# Patient Record
Sex: Male | Born: 1965 | Race: White | Hispanic: No | Marital: Single | Smoking: Former smoker
Health system: Southern US, Community
[De-identification: ages and names within clinical notes are randomized; demographics above are authoritative.]

## PROBLEM LIST (undated history)

## (undated) DIAGNOSIS — N289 Disorder of kidney and ureter, unspecified: Secondary | ICD-10-CM

## (undated) DIAGNOSIS — R55 Syncope and collapse: Secondary | ICD-10-CM

## (undated) DIAGNOSIS — J449 Chronic obstructive pulmonary disease, unspecified: Secondary | ICD-10-CM

## (undated) DIAGNOSIS — G4730 Sleep apnea, unspecified: Secondary | ICD-10-CM

## (undated) DIAGNOSIS — I10 Essential (primary) hypertension: Secondary | ICD-10-CM

## (undated) DIAGNOSIS — K7460 Unspecified cirrhosis of liver: Secondary | ICD-10-CM

## (undated) DIAGNOSIS — Z720 Tobacco use: Secondary | ICD-10-CM

## (undated) DIAGNOSIS — E119 Type 2 diabetes mellitus without complications: Secondary | ICD-10-CM

## (undated) DIAGNOSIS — T1491XA Suicide attempt, initial encounter: Secondary | ICD-10-CM

## (undated) HISTORY — PX: LEG SURGERY: SHX1003

## (undated) HISTORY — PX: CHOLECYSTECTOMY: SHX55

---

## 2004-07-26 ENCOUNTER — Inpatient Hospital Stay: Payer: Self-pay | Admitting: Anesthesiology

## 2005-11-21 ENCOUNTER — Other Ambulatory Visit: Payer: Self-pay

## 2005-11-21 ENCOUNTER — Inpatient Hospital Stay: Payer: Self-pay

## 2005-12-04 ENCOUNTER — Ambulatory Visit: Payer: Self-pay | Admitting: Internal Medicine

## 2005-12-13 ENCOUNTER — Ambulatory Visit: Payer: Self-pay | Admitting: Internal Medicine

## 2005-12-19 ENCOUNTER — Ambulatory Visit: Payer: Self-pay | Admitting: Gastroenterology

## 2006-01-13 ENCOUNTER — Ambulatory Visit: Payer: Self-pay | Admitting: Internal Medicine

## 2006-02-06 ENCOUNTER — Emergency Department: Payer: Self-pay | Admitting: Emergency Medicine

## 2006-02-06 ENCOUNTER — Other Ambulatory Visit: Payer: Self-pay

## 2006-02-12 ENCOUNTER — Ambulatory Visit: Payer: Self-pay | Admitting: Internal Medicine

## 2006-04-23 ENCOUNTER — Ambulatory Visit: Payer: Self-pay | Admitting: Internal Medicine

## 2006-05-15 ENCOUNTER — Ambulatory Visit: Payer: Self-pay | Admitting: Internal Medicine

## 2006-09-13 ENCOUNTER — Ambulatory Visit: Payer: Self-pay | Admitting: Internal Medicine

## 2006-10-02 ENCOUNTER — Ambulatory Visit: Payer: Self-pay | Admitting: Internal Medicine

## 2006-10-14 ENCOUNTER — Ambulatory Visit: Payer: Self-pay | Admitting: Internal Medicine

## 2006-11-13 ENCOUNTER — Ambulatory Visit: Payer: Self-pay | Admitting: Internal Medicine

## 2006-11-27 ENCOUNTER — Ambulatory Visit: Payer: Self-pay | Admitting: Internal Medicine

## 2006-12-14 ENCOUNTER — Ambulatory Visit: Payer: Self-pay | Admitting: Internal Medicine

## 2007-01-08 ENCOUNTER — Encounter: Payer: Self-pay | Admitting: Internal Medicine

## 2007-01-14 ENCOUNTER — Ambulatory Visit: Payer: Self-pay | Admitting: Internal Medicine

## 2007-01-25 ENCOUNTER — Ambulatory Visit: Payer: Self-pay | Admitting: Internal Medicine

## 2007-02-13 ENCOUNTER — Ambulatory Visit: Payer: Self-pay | Admitting: Internal Medicine

## 2007-10-14 ENCOUNTER — Ambulatory Visit: Payer: Self-pay | Admitting: Internal Medicine

## 2010-05-24 ENCOUNTER — Inpatient Hospital Stay: Payer: Self-pay | Admitting: Internal Medicine

## 2010-06-10 ENCOUNTER — Inpatient Hospital Stay: Payer: Self-pay | Admitting: Internal Medicine

## 2010-06-16 ENCOUNTER — Inpatient Hospital Stay: Payer: Self-pay | Admitting: Internal Medicine

## 2010-06-17 ENCOUNTER — Inpatient Hospital Stay: Payer: Self-pay | Admitting: Psychiatry

## 2010-06-27 ENCOUNTER — Inpatient Hospital Stay: Payer: Self-pay | Admitting: Psychiatry

## 2010-07-27 ENCOUNTER — Inpatient Hospital Stay: Payer: Self-pay | Admitting: Psychiatry

## 2010-08-06 ENCOUNTER — Emergency Department: Payer: Self-pay | Admitting: Unknown Physician Specialty

## 2010-08-08 ENCOUNTER — Ambulatory Visit: Payer: Self-pay | Admitting: Internal Medicine

## 2010-08-22 ENCOUNTER — Emergency Department: Payer: Self-pay | Admitting: Emergency Medicine

## 2010-08-31 ENCOUNTER — Inpatient Hospital Stay: Payer: Self-pay | Admitting: Internal Medicine

## 2010-09-05 LAB — AFP TUMOR MARKER: AFP-Tumor Marker: 3.3 ng/mL (ref 0.0–8.3)

## 2010-09-14 ENCOUNTER — Inpatient Hospital Stay: Payer: Self-pay | Admitting: Psychiatry

## 2010-09-28 ENCOUNTER — Inpatient Hospital Stay: Payer: Self-pay | Admitting: Psychiatry

## 2010-12-10 ENCOUNTER — Inpatient Hospital Stay: Payer: Self-pay | Admitting: Internal Medicine

## 2011-02-08 ENCOUNTER — Inpatient Hospital Stay: Payer: Self-pay | Admitting: Internal Medicine

## 2011-03-22 ENCOUNTER — Ambulatory Visit: Payer: Self-pay | Admitting: Gastroenterology

## 2011-04-11 ENCOUNTER — Ambulatory Visit: Payer: Self-pay | Admitting: Gastroenterology

## 2011-05-18 ENCOUNTER — Inpatient Hospital Stay: Payer: Self-pay | Admitting: Internal Medicine

## 2011-05-18 LAB — URINALYSIS, COMPLETE
Bacteria: NONE SEEN
Bilirubin,UR: NEGATIVE
Blood: NEGATIVE
Glucose,UR: NEGATIVE mg/dL (ref 0–75)
Ketone: NEGATIVE
Leukocyte Esterase: NEGATIVE
Nitrite: NEGATIVE
Ph: 5 (ref 4.5–8.0)
Protein: NEGATIVE

## 2011-05-18 LAB — COMPREHENSIVE METABOLIC PANEL
Alkaline Phosphatase: 110 U/L (ref 50–136)
Anion Gap: 8 (ref 7–16)
BUN: 23 mg/dL — ABNORMAL HIGH (ref 7–18)
Bilirubin,Total: 0.9 mg/dL (ref 0.2–1.0)
Co2: 36 mmol/L — ABNORMAL HIGH (ref 21–32)
EGFR (Non-African Amer.): >60
SGOT(AST): 27 U/L (ref 15–37)
SGPT (ALT): 25 U/L
Sodium: 142 mmol/L (ref 136–145)
Total Protein: 8 g/dL (ref 6.4–8.2)

## 2011-05-18 LAB — CBC
HCT: 42.7 % (ref 40.0–52.0)
MCH: 32.3 pg (ref 26.0–34.0)
MCHC: 33.3 g/dL (ref 32.0–36.0)
MCV: 97 fL (ref 80–100)
Platelet: 154 10*3/uL (ref 150–440)
RDW: 14.7 % — ABNORMAL HIGH (ref 11.5–14.5)
WBC: 6.5 10*3/uL (ref 3.8–10.6)

## 2011-05-18 LAB — LIPASE, BLOOD: Lipase: 85 U/L (ref 73–393)

## 2011-05-18 LAB — PROTIME-INR: Prothrombin Time: 13.6 secs (ref 11.5–14.7)

## 2011-05-19 LAB — CBC WITH DIFFERENTIAL/PLATELET
Basophil #: 0 10*3/uL (ref 0.0–0.1)
Eosinophil #: 0 10*3/uL (ref 0.0–0.7)
HCT: 38.7 % — ABNORMAL LOW (ref 40.0–52.0)
Lymphocyte %: 10.9 %
MCH: 32.2 pg (ref 26.0–34.0)
MCHC: 33.3 g/dL (ref 32.0–36.0)
Monocyte %: 2.2 %
Neutrophil #: 3.2 10*3/uL (ref 1.4–6.5)
Neutrophil %: 86.6 %
Platelet: 108 10*3/uL — ABNORMAL LOW (ref 150–440)
RBC: 4 10*6/uL — ABNORMAL LOW (ref 4.40–5.90)
RDW: 14.4 % (ref 11.5–14.5)

## 2011-05-19 LAB — BASIC METABOLIC PANEL
Anion Gap: 10 (ref 7–16)
Chloride: 98 mmol/L (ref 98–107)
Co2: 31 mmol/L (ref 21–32)
Creatinine: 1.38 mg/dL — ABNORMAL HIGH (ref 0.60–1.30)
Glucose: 144 mg/dL — ABNORMAL HIGH (ref 65–99)
Osmolality: 284 (ref 275–301)

## 2011-05-19 LAB — BODY FLUID CELL COUNT WITH DIFFERENTIAL
Basophil: 0 %
Eosinophil: 0 %
Nucleated Cell Count: 156 /mm3
Other Mononuclear Cells: 68 %

## 2011-05-20 LAB — CBC WITH DIFFERENTIAL/PLATELET
Basophil %: 0 %
Eosinophil #: 0 10*3/uL (ref 0.0–0.7)
HCT: 39.8 % — ABNORMAL LOW (ref 40.0–52.0)
HGB: 13.3 g/dL (ref 13.0–18.0)
Lymphocyte #: 0.4 10*3/uL — ABNORMAL LOW (ref 1.0–3.6)
Lymphocyte %: 6.4 %
MCH: 32 pg (ref 26.0–34.0)
MCHC: 33.3 g/dL (ref 32.0–36.0)
MCV: 96 fL (ref 80–100)
Monocyte %: 1.9 %
Neutrophil #: 5.9 10*3/uL (ref 1.4–6.5)
Platelet: 121 10*3/uL — ABNORMAL LOW (ref 150–440)
RDW: 14.6 % — ABNORMAL HIGH (ref 11.5–14.5)

## 2011-05-20 LAB — BASIC METABOLIC PANEL
Anion Gap: 9 (ref 7–16)
BUN: 19 mg/dL — ABNORMAL HIGH (ref 7–18)
Calcium, Total: 8.6 mg/dL (ref 8.5–10.1)
Chloride: 97 mmol/L — ABNORMAL LOW (ref 98–107)
Creatinine: 1.24 mg/dL (ref 0.60–1.30)
EGFR (African American): >60
EGFR (Non-African Amer.): >60
Glucose: 209 mg/dL — ABNORMAL HIGH (ref 65–99)
Osmolality: 286 (ref 275–301)
Potassium: 4.1 mmol/L (ref 3.5–5.1)
Sodium: 139 mmol/L (ref 136–145)

## 2011-05-21 LAB — CBC WITH DIFFERENTIAL/PLATELET
Basophil %: 0.2 %
Eosinophil #: 0 10*3/uL (ref 0.0–0.7)
Eosinophil %: 0 %
HCT: 40.2 % (ref 40.0–52.0)
HGB: 13.4 g/dL (ref 13.0–18.0)
MCH: 32.3 pg (ref 26.0–34.0)
MCHC: 33.3 g/dL (ref 32.0–36.0)
MCV: 97 fL (ref 80–100)
Monocyte %: 2.5 %
Neutrophil #: 5.1 10*3/uL (ref 1.4–6.5)
Neutrophil %: 90.2 %
Platelet: 108 10*3/uL — ABNORMAL LOW (ref 150–440)
RBC: 4.14 10*6/uL — ABNORMAL LOW (ref 4.40–5.90)
RDW: 14.9 % — ABNORMAL HIGH (ref 11.5–14.5)
WBC: 5.7 10*3/uL (ref 3.8–10.6)

## 2011-05-21 LAB — BASIC METABOLIC PANEL
Anion Gap: 6 — ABNORMAL LOW (ref 7–16)
BUN: 19 mg/dL — ABNORMAL HIGH (ref 7–18)
Chloride: 101 mmol/L (ref 98–107)
Co2: 33 mmol/L — ABNORMAL HIGH (ref 21–32)
Creatinine: 1.08 mg/dL (ref 0.60–1.30)
EGFR (Non-African Amer.): >60
Glucose: 138 mg/dL — ABNORMAL HIGH (ref 65–99)
Sodium: 140 mmol/L (ref 136–145)

## 2011-05-22 LAB — BASIC METABOLIC PANEL
Anion Gap: 9 (ref 7–16)
BUN: 24 mg/dL — ABNORMAL HIGH (ref 7–18)
Calcium, Total: 8.5 mg/dL (ref 8.5–10.1)
Chloride: 102 mmol/L (ref 98–107)
Co2: 31 mmol/L (ref 21–32)
EGFR (African American): >60
EGFR (Non-African Amer.): >60
Glucose: 155 mg/dL — ABNORMAL HIGH (ref 65–99)
Osmolality: 290 (ref 275–301)
Potassium: 4.1 mmol/L (ref 3.5–5.1)

## 2011-05-22 LAB — CBC WITH DIFFERENTIAL/PLATELET
Basophil #: 0 10*3/uL (ref 0.0–0.1)
Eosinophil #: 0 10*3/uL (ref 0.0–0.7)
HCT: 40.5 % (ref 40.0–52.0)
HGB: 13.5 g/dL (ref 13.0–18.0)
Lymphocyte #: 0.3 10*3/uL — ABNORMAL LOW (ref 1.0–3.6)
Lymphocyte %: 6.3 %
MCH: 32.1 pg (ref 26.0–34.0)
MCHC: 33.5 g/dL (ref 32.0–36.0)
Monocyte #: 0.2 10*3/uL (ref 0.0–0.7)
Neutrophil #: 5 10*3/uL (ref 1.4–6.5)
Neutrophil %: 90.6 %
RBC: 4.22 10*6/uL — ABNORMAL LOW (ref 4.40–5.90)
WBC: 5.5 10*3/uL (ref 3.8–10.6)

## 2011-05-23 LAB — CBC WITH DIFFERENTIAL/PLATELET
Basophil #: 0 10*3/uL (ref 0.0–0.1)
Basophil %: 0.1 %
HCT: 39.1 % — ABNORMAL LOW (ref 40.0–52.0)
HGB: 13 g/dL (ref 13.0–18.0)
Lymphocyte #: 0.3 10*3/uL — ABNORMAL LOW (ref 1.0–3.6)
MCH: 32.1 pg (ref 26.0–34.0)
MCV: 96 fL (ref 80–100)
Monocyte #: 0.2 10*3/uL (ref 0.0–0.7)
Monocyte %: 3.6 %
Neutrophil #: 3.8 10*3/uL (ref 1.4–6.5)
Neutrophil %: 89.5 %
Platelet: 74 10*3/uL — ABNORMAL LOW (ref 150–440)
RDW: 15.1 % — ABNORMAL HIGH (ref 11.5–14.5)

## 2011-05-23 LAB — BASIC METABOLIC PANEL
Calcium, Total: 8.1 mg/dL — ABNORMAL LOW (ref 8.5–10.1)
Co2: 33 mmol/L — ABNORMAL HIGH (ref 21–32)
EGFR (Non-African Amer.): >60
Glucose: 107 mg/dL — ABNORMAL HIGH (ref 65–99)
Osmolality: 292 (ref 275–301)
Potassium: 4.5 mmol/L (ref 3.5–5.1)
Sodium: 142 mmol/L (ref 136–145)

## 2011-05-23 LAB — BODY FLUID CULTURE

## 2011-05-23 LAB — AMMONIA: Ammonia, Plasma: 28 mcmol/L (ref 11–32)

## 2011-05-24 LAB — CULTURE, BLOOD (SINGLE)

## 2011-06-27 ENCOUNTER — Ambulatory Visit: Payer: Self-pay | Admitting: Gastroenterology

## 2011-06-27 LAB — AMMONIA: Ammonia, Plasma: 61 mcmol/L — ABNORMAL HIGH (ref 11–32)

## 2011-07-03 ENCOUNTER — Ambulatory Visit: Payer: Self-pay | Admitting: Gastroenterology

## 2011-10-26 ENCOUNTER — Ambulatory Visit: Payer: Self-pay | Admitting: Gastroenterology

## 2011-11-21 ENCOUNTER — Ambulatory Visit: Payer: Self-pay | Admitting: Gastroenterology

## 2011-12-22 ENCOUNTER — Observation Stay: Payer: Self-pay | Admitting: Internal Medicine

## 2011-12-22 LAB — CBC WITH DIFFERENTIAL/PLATELET
Basophil #: 0.1 10*3/uL (ref 0.0–0.1)
Basophil %: 1.1 %
Eosinophil #: 0.1 10*3/uL (ref 0.0–0.7)
HCT: 43.3 % (ref 40.0–52.0)
HGB: 14.6 g/dL (ref 13.0–18.0)
Lymphocyte #: 1.7 10*3/uL (ref 1.0–3.6)
Lymphocyte %: 21.2 %
MCH: 33.2 pg (ref 26.0–34.0)
MCHC: 33.7 g/dL (ref 32.0–36.0)
MCV: 99 fL (ref 80–100)
Monocyte %: 7.1 %
Neutrophil #: 5.5 10*3/uL (ref 1.4–6.5)
Platelet: 165 10*3/uL (ref 150–440)
RDW: 14.5 % (ref 11.5–14.5)

## 2011-12-22 LAB — COMPREHENSIVE METABOLIC PANEL
Albumin: 3.6 g/dL (ref 3.4–5.0)
Anion Gap: 7 (ref 7–16)
BUN: 29 mg/dL — ABNORMAL HIGH (ref 7–18)
Calcium, Total: 8.9 mg/dL (ref 8.5–10.1)
Chloride: 102 mmol/L (ref 98–107)
EGFR (Non-African Amer.): 51 — ABNORMAL LOW
Glucose: 96 mg/dL (ref 65–99)
Osmolality: 280 (ref 275–301)
Potassium: 4.2 mmol/L (ref 3.5–5.1)
SGOT(AST): 23 U/L (ref 15–37)
SGPT (ALT): 18 U/L (ref 12–78)
Total Protein: 8.2 g/dL (ref 6.4–8.2)

## 2011-12-22 LAB — CK TOTAL AND CKMB (NOT AT ARMC): CK-MB: <0.5 ng/mL — ABNORMAL LOW (ref 0.5–3.6)

## 2011-12-22 LAB — TROPONIN I: Troponin-I: <0.02 ng/mL

## 2011-12-22 LAB — APTT: Activated PTT: 29.9 secs (ref 23.6–35.9)

## 2011-12-23 LAB — BASIC METABOLIC PANEL
Anion Gap: 8 (ref 7–16)
Calcium, Total: 8.6 mg/dL (ref 8.5–10.1)
Chloride: 102 mmol/L (ref 98–107)
Co2: 30 mmol/L (ref 21–32)
Creatinine: 1.37 mg/dL — ABNORMAL HIGH (ref 0.60–1.30)
EGFR (African American): >60
Glucose: 97 mg/dL (ref 65–99)
Osmolality: 285 (ref 275–301)
Sodium: 140 mmol/L (ref 136–145)

## 2011-12-23 LAB — TROPONIN I: Troponin-I: <0.02 ng/mL

## 2011-12-23 LAB — LIPID PANEL
Ldl Cholesterol, Calc: 62 mg/dL (ref 0–100)
Triglycerides: 85 mg/dL (ref 0–200)
VLDL Cholesterol, Calc: 17 mg/dL (ref 5–40)

## 2012-01-05 ENCOUNTER — Emergency Department: Payer: Self-pay | Admitting: Emergency Medicine

## 2012-01-05 LAB — URINALYSIS, COMPLETE
Bilirubin,UR: NEGATIVE
Blood: NEGATIVE
Ketone: NEGATIVE
Nitrite: NEGATIVE
Ph: 5 (ref 4.5–8.0)
Protein: NEGATIVE
RBC,UR: NONE SEEN /HPF (ref 0–5)
Specific Gravity: 1.01 (ref 1.003–1.030)
Squamous Epithelial: 8

## 2012-01-05 LAB — COMPREHENSIVE METABOLIC PANEL
Albumin: 3.6 g/dL (ref 3.4–5.0)
Alkaline Phosphatase: 122 U/L (ref 50–136)
Anion Gap: 5 — ABNORMAL LOW (ref 7–16)
Bilirubin,Total: 0.8 mg/dL (ref 0.2–1.0)
Calcium, Total: 9.1 mg/dL (ref 8.5–10.1)
Chloride: 103 mmol/L (ref 98–107)
EGFR (African American): 50 — ABNORMAL LOW
EGFR (Non-African Amer.): 43 — ABNORMAL LOW
Glucose: 115 mg/dL — ABNORMAL HIGH (ref 65–99)
Osmolality: 289 (ref 275–301)
Potassium: 4.5 mmol/L (ref 3.5–5.1)
SGOT(AST): 23 U/L (ref 15–37)
Sodium: 139 mmol/L (ref 136–145)
Total Protein: 7.9 g/dL (ref 6.4–8.2)

## 2012-01-05 LAB — CBC
HCT: 40 % (ref 40.0–52.0)
HGB: 13.6 g/dL (ref 13.0–18.0)
MCH: 34 pg (ref 26.0–34.0)
MCHC: 34 g/dL (ref 32.0–36.0)
Platelet: 113 10*3/uL — ABNORMAL LOW (ref 150–440)
RBC: 4.01 10*6/uL — ABNORMAL LOW (ref 4.40–5.90)
RDW: 14.6 % — ABNORMAL HIGH (ref 11.5–14.5)
WBC: 8.4 10*3/uL (ref 3.8–10.6)

## 2012-02-27 ENCOUNTER — Ambulatory Visit: Payer: Self-pay | Admitting: Internal Medicine

## 2012-03-01 ENCOUNTER — Emergency Department: Payer: Self-pay | Admitting: Emergency Medicine

## 2012-03-08 ENCOUNTER — Ambulatory Visit: Payer: Self-pay | Admitting: Internal Medicine

## 2012-03-24 ENCOUNTER — Emergency Department: Payer: Self-pay | Admitting: Emergency Medicine

## 2012-04-02 ENCOUNTER — Ambulatory Visit: Payer: Self-pay | Admitting: Sports Medicine

## 2012-09-12 ENCOUNTER — Emergency Department: Payer: Self-pay | Admitting: Emergency Medicine

## 2012-09-12 LAB — BASIC METABOLIC PANEL
Anion Gap: 4 — ABNORMAL LOW (ref 7–16)
BUN: 31 mg/dL — ABNORMAL HIGH (ref 7–18)
Calcium, Total: 8.8 mg/dL (ref 8.5–10.1)
EGFR (Non-African Amer.): 53 — ABNORMAL LOW
Glucose: 100 mg/dL — ABNORMAL HIGH (ref 65–99)

## 2012-09-12 LAB — CBC
HGB: 13.8 g/dL (ref 13.0–18.0)
MCH: 34.6 pg — ABNORMAL HIGH (ref 26.0–34.0)
MCV: 97 fL (ref 80–100)
RBC: 3.98 10*6/uL — ABNORMAL LOW (ref 4.40–5.90)
RDW: 13.3 % (ref 11.5–14.5)

## 2012-10-22 ENCOUNTER — Other Ambulatory Visit (HOSPITAL_COMMUNITY): Payer: Self-pay | Admitting: Internal Medicine

## 2012-10-22 ENCOUNTER — Ambulatory Visit (HOSPITAL_COMMUNITY)
Admission: RE | Admit: 2012-10-22 | Discharge: 2012-10-22 | Disposition: A | Discharge status: Home / Self Care | Payer: Medicare Other | Source: Ambulatory Visit | Attending: Internal Medicine | Admitting: Internal Medicine

## 2012-10-22 DIAGNOSIS — I10 Essential (primary) hypertension: Secondary | ICD-10-CM | POA: Insufficient documentation

## 2012-10-22 DIAGNOSIS — J449 Chronic obstructive pulmonary disease, unspecified: Secondary | ICD-10-CM | POA: Diagnosis not present

## 2012-10-22 DIAGNOSIS — J40 Bronchitis, not specified as acute or chronic: Secondary | ICD-10-CM | POA: Diagnosis not present

## 2012-10-22 DIAGNOSIS — J4489 Other specified chronic obstructive pulmonary disease: Secondary | ICD-10-CM | POA: Insufficient documentation

## 2012-10-22 DIAGNOSIS — R059 Cough, unspecified: Secondary | ICD-10-CM | POA: Diagnosis not present

## 2012-10-22 DIAGNOSIS — J438 Other emphysema: Secondary | ICD-10-CM | POA: Diagnosis not present

## 2012-10-22 DIAGNOSIS — R05 Cough: Secondary | ICD-10-CM | POA: Insufficient documentation

## 2012-11-09 ENCOUNTER — Emergency Department: Payer: Self-pay | Admitting: Internal Medicine

## 2012-11-09 DIAGNOSIS — IMO0002 Reserved for concepts with insufficient information to code with codable children: Secondary | ICD-10-CM | POA: Diagnosis not present

## 2012-11-09 DIAGNOSIS — M1710 Unilateral primary osteoarthritis, unspecified knee: Secondary | ICD-10-CM | POA: Diagnosis not present

## 2012-11-09 DIAGNOSIS — G8929 Other chronic pain: Secondary | ICD-10-CM | POA: Diagnosis not present

## 2012-11-09 DIAGNOSIS — M25569 Pain in unspecified knee: Secondary | ICD-10-CM | POA: Diagnosis not present

## 2012-11-19 ENCOUNTER — Ambulatory Visit: Payer: Self-pay | Admitting: Gastroenterology

## 2012-11-19 DIAGNOSIS — K7030 Alcoholic cirrhosis of liver without ascites: Secondary | ICD-10-CM | POA: Diagnosis not present

## 2012-12-03 DIAGNOSIS — I10 Essential (primary) hypertension: Secondary | ICD-10-CM | POA: Diagnosis not present

## 2012-12-03 DIAGNOSIS — K7460 Unspecified cirrhosis of liver: Secondary | ICD-10-CM | POA: Diagnosis not present

## 2012-12-03 DIAGNOSIS — J449 Chronic obstructive pulmonary disease, unspecified: Secondary | ICD-10-CM | POA: Diagnosis not present

## 2012-12-11 ENCOUNTER — Emergency Department: Payer: Self-pay | Admitting: Emergency Medicine

## 2012-12-11 DIAGNOSIS — F333 Major depressive disorder, recurrent, severe with psychotic symptoms: Secondary | ICD-10-CM | POA: Diagnosis not present

## 2012-12-11 DIAGNOSIS — F17200 Nicotine dependence, unspecified, uncomplicated: Secondary | ICD-10-CM | POA: Diagnosis not present

## 2012-12-11 DIAGNOSIS — F329 Major depressive disorder, single episode, unspecified: Secondary | ICD-10-CM | POA: Diagnosis not present

## 2012-12-11 DIAGNOSIS — E119 Type 2 diabetes mellitus without complications: Secondary | ICD-10-CM | POA: Diagnosis not present

## 2012-12-11 DIAGNOSIS — R45851 Suicidal ideations: Secondary | ICD-10-CM | POA: Diagnosis not present

## 2012-12-11 DIAGNOSIS — I10 Essential (primary) hypertension: Secondary | ICD-10-CM | POA: Diagnosis not present

## 2012-12-11 DIAGNOSIS — F332 Major depressive disorder, recurrent severe without psychotic features: Secondary | ICD-10-CM | POA: Diagnosis not present

## 2012-12-11 DIAGNOSIS — Z79899 Other long term (current) drug therapy: Secondary | ICD-10-CM | POA: Diagnosis not present

## 2012-12-11 LAB — DRUG SCREEN, URINE
Amphetamines, Ur Screen: NEGATIVE (ref ?–1000)
Barbiturates, Ur Screen: NEGATIVE (ref ?–200)
Benzodiazepine, Ur Scrn: NEGATIVE (ref ?–200)
Cocaine Metabolite,Ur ~~LOC~~: NEGATIVE (ref ?–300)
MDMA (Ecstasy)Ur Screen: POSITIVE (ref ?–500)
Opiate, Ur Screen: NEGATIVE (ref ?–300)
Phencyclidine (PCP) Ur S: NEGATIVE (ref ?–25)
Tricyclic, Ur Screen: NEGATIVE (ref ?–1000)

## 2012-12-11 LAB — CBC
HCT: 39.6 % — ABNORMAL LOW (ref 40.0–52.0)
HGB: 14 g/dL (ref 13.0–18.0)
MCH: 34.6 pg — ABNORMAL HIGH (ref 26.0–34.0)
MCHC: 35.3 g/dL (ref 32.0–36.0)
Platelet: 119 10*3/uL — ABNORMAL LOW (ref 150–440)

## 2012-12-11 LAB — URINALYSIS, COMPLETE
Bilirubin,UR: NEGATIVE
Blood: NEGATIVE
Glucose,UR: NEGATIVE mg/dL (ref 0–75)
Nitrite: NEGATIVE
Ph: 5 (ref 4.5–8.0)
Protein: NEGATIVE
RBC,UR: 2 /HPF (ref 0–5)
Specific Gravity: 1.023 (ref 1.003–1.030)
Squamous Epithelial: 6
WBC UR: 4 /HPF (ref 0–5)

## 2012-12-11 LAB — COMPREHENSIVE METABOLIC PANEL
Albumin: 3.8 g/dL (ref 3.4–5.0)
Alkaline Phosphatase: 109 U/L (ref 50–136)
BUN: 30 mg/dL — ABNORMAL HIGH (ref 7–18)
Calcium, Total: 9 mg/dL (ref 8.5–10.1)
Co2: 28 mmol/L (ref 21–32)
EGFR (African American): 56 — ABNORMAL LOW
EGFR (Non-African Amer.): 49 — ABNORMAL LOW
Osmolality: 282 (ref 275–301)
Potassium: 4.4 mmol/L (ref 3.5–5.1)
SGPT (ALT): 27 U/L (ref 12–78)
Sodium: 138 mmol/L (ref 136–145)
Total Protein: 7.6 g/dL (ref 6.4–8.2)

## 2012-12-11 LAB — TSH: Thyroid Stimulating Horm: 1.69 u[IU]/mL

## 2012-12-12 DIAGNOSIS — R45851 Suicidal ideations: Secondary | ICD-10-CM | POA: Diagnosis not present

## 2012-12-12 DIAGNOSIS — F17200 Nicotine dependence, unspecified, uncomplicated: Secondary | ICD-10-CM | POA: Diagnosis present

## 2012-12-12 DIAGNOSIS — E119 Type 2 diabetes mellitus without complications: Secondary | ICD-10-CM | POA: Diagnosis not present

## 2012-12-12 DIAGNOSIS — J449 Chronic obstructive pulmonary disease, unspecified: Secondary | ICD-10-CM | POA: Diagnosis not present

## 2012-12-12 DIAGNOSIS — I10 Essential (primary) hypertension: Secondary | ICD-10-CM | POA: Diagnosis not present

## 2012-12-12 DIAGNOSIS — J4489 Other specified chronic obstructive pulmonary disease: Secondary | ICD-10-CM | POA: Diagnosis not present

## 2012-12-12 DIAGNOSIS — F332 Major depressive disorder, recurrent severe without psychotic features: Secondary | ICD-10-CM | POA: Diagnosis not present

## 2012-12-12 DIAGNOSIS — Z79899 Other long term (current) drug therapy: Secondary | ICD-10-CM | POA: Diagnosis not present

## 2012-12-12 DIAGNOSIS — K7460 Unspecified cirrhosis of liver: Secondary | ICD-10-CM | POA: Diagnosis present

## 2012-12-12 DIAGNOSIS — F329 Major depressive disorder, single episode, unspecified: Secondary | ICD-10-CM | POA: Diagnosis not present

## 2013-01-02 DIAGNOSIS — E119 Type 2 diabetes mellitus without complications: Secondary | ICD-10-CM | POA: Diagnosis not present

## 2013-01-02 DIAGNOSIS — Z79899 Other long term (current) drug therapy: Secondary | ICD-10-CM | POA: Diagnosis not present

## 2013-01-02 DIAGNOSIS — I10 Essential (primary) hypertension: Secondary | ICD-10-CM | POA: Diagnosis not present

## 2013-01-17 DIAGNOSIS — F331 Major depressive disorder, recurrent, moderate: Secondary | ICD-10-CM | POA: Diagnosis not present

## 2013-01-23 DIAGNOSIS — K7030 Alcoholic cirrhosis of liver without ascites: Secondary | ICD-10-CM | POA: Diagnosis not present

## 2013-01-23 DIAGNOSIS — K219 Gastro-esophageal reflux disease without esophagitis: Secondary | ICD-10-CM | POA: Diagnosis not present

## 2013-01-28 DIAGNOSIS — K7689 Other specified diseases of liver: Secondary | ICD-10-CM | POA: Diagnosis not present

## 2013-01-28 DIAGNOSIS — I10 Essential (primary) hypertension: Secondary | ICD-10-CM | POA: Diagnosis not present

## 2013-01-28 DIAGNOSIS — J449 Chronic obstructive pulmonary disease, unspecified: Secondary | ICD-10-CM | POA: Diagnosis not present

## 2013-01-28 DIAGNOSIS — E875 Hyperkalemia: Secondary | ICD-10-CM | POA: Diagnosis not present

## 2013-01-30 DIAGNOSIS — I10 Essential (primary) hypertension: Secondary | ICD-10-CM | POA: Diagnosis not present

## 2013-01-30 DIAGNOSIS — J449 Chronic obstructive pulmonary disease, unspecified: Secondary | ICD-10-CM | POA: Diagnosis not present

## 2013-01-30 DIAGNOSIS — K7689 Other specified diseases of liver: Secondary | ICD-10-CM | POA: Diagnosis not present

## 2013-01-30 DIAGNOSIS — E875 Hyperkalemia: Secondary | ICD-10-CM | POA: Diagnosis not present

## 2013-02-04 DIAGNOSIS — E119 Type 2 diabetes mellitus without complications: Secondary | ICD-10-CM | POA: Diagnosis not present

## 2013-02-04 DIAGNOSIS — J449 Chronic obstructive pulmonary disease, unspecified: Secondary | ICD-10-CM | POA: Diagnosis not present

## 2013-02-04 DIAGNOSIS — Z23 Encounter for immunization: Secondary | ICD-10-CM | POA: Diagnosis not present

## 2013-02-11 DIAGNOSIS — IMO0002 Reserved for concepts with insufficient information to code with codable children: Secondary | ICD-10-CM | POA: Diagnosis not present

## 2013-02-11 DIAGNOSIS — I129 Hypertensive chronic kidney disease with stage 1 through stage 4 chronic kidney disease, or unspecified chronic kidney disease: Secondary | ICD-10-CM | POA: Diagnosis not present

## 2013-02-11 DIAGNOSIS — E119 Type 2 diabetes mellitus without complications: Secondary | ICD-10-CM | POA: Diagnosis not present

## 2013-03-17 DIAGNOSIS — F314 Bipolar disorder, current episode depressed, severe, without psychotic features: Secondary | ICD-10-CM | POA: Diagnosis not present

## 2013-03-25 ENCOUNTER — Inpatient Hospital Stay: Payer: Self-pay | Admitting: Internal Medicine

## 2013-03-25 DIAGNOSIS — N183 Chronic kidney disease, stage 3 unspecified: Secondary | ICD-10-CM | POA: Diagnosis not present

## 2013-03-25 DIAGNOSIS — I959 Hypotension, unspecified: Secondary | ICD-10-CM | POA: Diagnosis not present

## 2013-03-25 DIAGNOSIS — R1013 Epigastric pain: Secondary | ICD-10-CM | POA: Diagnosis not present

## 2013-03-25 DIAGNOSIS — R6510 Systemic inflammatory response syndrome (SIRS) of non-infectious origin without acute organ dysfunction: Secondary | ICD-10-CM | POA: Diagnosis not present

## 2013-03-25 DIAGNOSIS — N289 Disorder of kidney and ureter, unspecified: Secondary | ICD-10-CM | POA: Diagnosis not present

## 2013-03-25 DIAGNOSIS — R5381 Other malaise: Secondary | ICD-10-CM | POA: Diagnosis not present

## 2013-03-25 DIAGNOSIS — J984 Other disorders of lung: Secondary | ICD-10-CM | POA: Diagnosis not present

## 2013-03-25 DIAGNOSIS — R52 Pain, unspecified: Secondary | ICD-10-CM | POA: Diagnosis not present

## 2013-03-25 DIAGNOSIS — E119 Type 2 diabetes mellitus without complications: Secondary | ICD-10-CM | POA: Diagnosis not present

## 2013-03-25 DIAGNOSIS — F17200 Nicotine dependence, unspecified, uncomplicated: Secondary | ICD-10-CM | POA: Diagnosis not present

## 2013-03-25 DIAGNOSIS — K7460 Unspecified cirrhosis of liver: Secondary | ICD-10-CM | POA: Diagnosis not present

## 2013-03-25 DIAGNOSIS — J9600 Acute respiratory failure, unspecified whether with hypoxia or hypercapnia: Secondary | ICD-10-CM | POA: Diagnosis not present

## 2013-03-25 LAB — COMPREHENSIVE METABOLIC PANEL
Albumin: 3.4 g/dL (ref 3.4–5.0)
Alkaline Phosphatase: 89 U/L (ref 50–136)
Anion Gap: 4 — ABNORMAL LOW (ref 7–16)
BUN: 63 mg/dL — ABNORMAL HIGH (ref 7–18)
Calcium, Total: 8.4 mg/dL — ABNORMAL LOW (ref 8.5–10.1)
Chloride: 105 mmol/L (ref 98–107)
Co2: 28 mmol/L (ref 21–32)
Creatinine: 2.45 mg/dL — ABNORMAL HIGH (ref 0.60–1.30)
Glucose: 173 mg/dL — ABNORMAL HIGH (ref 65–99)
Potassium: 4.4 mmol/L (ref 3.5–5.1)
Sodium: 137 mmol/L (ref 136–145)

## 2013-03-25 LAB — PROTIME-INR
INR: 1.1
Prothrombin Time: 14.3 secs (ref 11.5–14.7)

## 2013-03-25 LAB — URINALYSIS, COMPLETE
Bilirubin,UR: NEGATIVE
Glucose,UR: NEGATIVE mg/dL (ref 0–75)
Ketone: NEGATIVE
Nitrite: NEGATIVE
Ph: 5 (ref 4.5–8.0)
Protein: NEGATIVE
RBC,UR: <1 /HPF (ref 0–5)
Specific Gravity: 1.023 (ref 1.003–1.030)
Squamous Epithelial: 1

## 2013-03-25 LAB — TROPONIN I: Troponin-I: <0.02 ng/mL

## 2013-03-25 LAB — DRUG SCREEN, URINE
Barbiturates, Ur Screen: NEGATIVE (ref ?–200)
Benzodiazepine, Ur Scrn: NEGATIVE (ref ?–200)
Methadone, Ur Screen: NEGATIVE (ref ?–300)
Opiate, Ur Screen: NEGATIVE (ref ?–300)
Tricyclic, Ur Screen: NEGATIVE (ref ?–1000)

## 2013-03-25 LAB — CBC
HCT: 38.9 % — ABNORMAL LOW (ref 40.0–52.0)
HGB: 13.6 g/dL (ref 13.0–18.0)
MCH: 35.6 pg — ABNORMAL HIGH (ref 26.0–34.0)
MCHC: 35 g/dL (ref 32.0–36.0)
MCV: 102 fL — ABNORMAL HIGH (ref 80–100)
RBC: 3.82 10*6/uL — ABNORMAL LOW (ref 4.40–5.90)
RDW: 14 % (ref 11.5–14.5)

## 2013-03-25 LAB — CK TOTAL AND CKMB (NOT AT ARMC)
CK, Total: 42 U/L (ref 35–232)
CK-MB: 0.6 ng/mL (ref 0.5–3.6)

## 2013-03-25 LAB — ETHANOL: Ethanol: <3 mg/dL

## 2013-03-25 LAB — SALICYLATE LEVEL: Salicylates, Serum: 4.6 mg/dL — ABNORMAL HIGH

## 2013-03-25 LAB — MAGNESIUM: Magnesium: 2 mg/dL

## 2013-03-26 DIAGNOSIS — R110 Nausea: Secondary | ICD-10-CM | POA: Diagnosis not present

## 2013-03-26 DIAGNOSIS — R109 Unspecified abdominal pain: Secondary | ICD-10-CM | POA: Diagnosis not present

## 2013-03-26 DIAGNOSIS — R1084 Generalized abdominal pain: Secondary | ICD-10-CM | POA: Diagnosis not present

## 2013-03-26 DIAGNOSIS — K625 Hemorrhage of anus and rectum: Secondary | ICD-10-CM | POA: Diagnosis not present

## 2013-03-26 DIAGNOSIS — I959 Hypotension, unspecified: Secondary | ICD-10-CM | POA: Diagnosis not present

## 2013-03-26 DIAGNOSIS — N179 Acute kidney failure, unspecified: Secondary | ICD-10-CM | POA: Diagnosis not present

## 2013-03-26 DIAGNOSIS — R197 Diarrhea, unspecified: Secondary | ICD-10-CM | POA: Diagnosis not present

## 2013-03-26 DIAGNOSIS — R6510 Systemic inflammatory response syndrome (SIRS) of non-infectious origin without acute organ dysfunction: Secondary | ICD-10-CM | POA: Diagnosis not present

## 2013-03-26 DIAGNOSIS — J9600 Acute respiratory failure, unspecified whether with hypoxia or hypercapnia: Secondary | ICD-10-CM | POA: Diagnosis not present

## 2013-03-26 DIAGNOSIS — K922 Gastrointestinal hemorrhage, unspecified: Secondary | ICD-10-CM | POA: Diagnosis not present

## 2013-03-26 LAB — CBC WITH DIFFERENTIAL/PLATELET
Basophil #: 0.1 10*3/uL (ref 0.0–0.1)
Basophil %: 0.7 %
Eosinophil #: 0.1 10*3/uL (ref 0.0–0.7)
Eosinophil %: 0.2 %
Eosinophil %: 1 %
HCT: 35.6 % — ABNORMAL LOW (ref 40.0–52.0)
HCT: 35.8 % — ABNORMAL LOW (ref 40.0–52.0)
HGB: 12.7 g/dL — ABNORMAL LOW (ref 13.0–18.0)
Lymphocyte %: 11.9 %
Lymphocyte %: 5.1 %
MCH: 35.2 pg — ABNORMAL HIGH (ref 26.0–34.0)
MCH: 35.9 pg — ABNORMAL HIGH (ref 26.0–34.0)
MCHC: 34.5 g/dL (ref 32.0–36.0)
MCV: 102 fL — ABNORMAL HIGH (ref 80–100)
Monocyte %: 1.6 %
Monocyte %: 5.2 %
Neutrophil #: 5.7 10*3/uL (ref 1.4–6.5)
Neutrophil %: 81.2 %
Neutrophil %: 92.6 %
RBC: 3.49 10*6/uL — ABNORMAL LOW (ref 4.40–5.90)
RDW: 13.9 % (ref 11.5–14.5)
RDW: 13.9 % (ref 11.5–14.5)
WBC: 6.1 10*3/uL (ref 3.8–10.6)

## 2013-03-26 LAB — COMPREHENSIVE METABOLIC PANEL
Albumin: 3 g/dL — ABNORMAL LOW (ref 3.4–5.0)
Anion Gap: 7 (ref 7–16)
Bilirubin,Total: 0.4 mg/dL (ref 0.2–1.0)
Co2: 25 mmol/L (ref 21–32)
Creatinine: 1.71 mg/dL — ABNORMAL HIGH (ref 0.60–1.30)
EGFR (African American): 53 — ABNORMAL LOW
EGFR (Non-African Amer.): 46 — ABNORMAL LOW
Potassium: 4.7 mmol/L (ref 3.5–5.1)

## 2013-03-27 DIAGNOSIS — J9600 Acute respiratory failure, unspecified whether with hypoxia or hypercapnia: Secondary | ICD-10-CM | POA: Diagnosis not present

## 2013-03-27 DIAGNOSIS — K922 Gastrointestinal hemorrhage, unspecified: Secondary | ICD-10-CM | POA: Diagnosis not present

## 2013-03-27 DIAGNOSIS — I959 Hypotension, unspecified: Secondary | ICD-10-CM | POA: Diagnosis not present

## 2013-03-27 DIAGNOSIS — R6510 Systemic inflammatory response syndrome (SIRS) of non-infectious origin without acute organ dysfunction: Secondary | ICD-10-CM | POA: Diagnosis not present

## 2013-03-27 DIAGNOSIS — N179 Acute kidney failure, unspecified: Secondary | ICD-10-CM | POA: Diagnosis not present

## 2013-03-27 LAB — CBC WITH DIFFERENTIAL/PLATELET
Basophil #: 0 10*3/uL (ref 0.0–0.1)
Basophil %: 0.1 %
Eosinophil #: 0 10*3/uL (ref 0.0–0.7)
Eosinophil %: 0 %
HCT: 36.2 % — ABNORMAL LOW (ref 40.0–52.0)
HGB: 12.8 g/dL — ABNORMAL LOW (ref 13.0–18.0)
Lymphocyte %: 5.2 %
MCH: 35.6 pg — ABNORMAL HIGH (ref 26.0–34.0)
MCHC: 35.4 g/dL (ref 32.0–36.0)
Monocyte #: 0.2 x10 3/mm (ref 0.2–1.0)
Platelet: 82 10*3/uL — ABNORMAL LOW (ref 150–440)
RBC: 3.59 10*6/uL — ABNORMAL LOW (ref 4.40–5.90)
RDW: 13.5 % (ref 11.5–14.5)
WBC: 6.8 10*3/uL (ref 3.8–10.6)

## 2013-03-27 LAB — BASIC METABOLIC PANEL
BUN: 33 mg/dL — ABNORMAL HIGH (ref 7–18)
Chloride: 111 mmol/L — ABNORMAL HIGH (ref 98–107)
Creatinine: 1.21 mg/dL (ref 0.60–1.30)
Glucose: 144 mg/dL — ABNORMAL HIGH (ref 65–99)
Potassium: 4.8 mmol/L (ref 3.5–5.1)
Sodium: 140 mmol/L (ref 136–145)

## 2013-03-30 LAB — CULTURE, BLOOD (SINGLE)

## 2013-03-31 DIAGNOSIS — F17200 Nicotine dependence, unspecified, uncomplicated: Secondary | ICD-10-CM | POA: Diagnosis not present

## 2013-03-31 DIAGNOSIS — I129 Hypertensive chronic kidney disease with stage 1 through stage 4 chronic kidney disease, or unspecified chronic kidney disease: Secondary | ICD-10-CM | POA: Diagnosis not present

## 2013-03-31 DIAGNOSIS — N179 Acute kidney failure, unspecified: Secondary | ICD-10-CM | POA: Diagnosis not present

## 2013-04-01 DIAGNOSIS — K7460 Unspecified cirrhosis of liver: Secondary | ICD-10-CM | POA: Diagnosis not present

## 2013-04-01 DIAGNOSIS — Z79899 Other long term (current) drug therapy: Secondary | ICD-10-CM | POA: Diagnosis not present

## 2013-04-01 DIAGNOSIS — E119 Type 2 diabetes mellitus without complications: Secondary | ICD-10-CM | POA: Diagnosis not present

## 2013-04-01 DIAGNOSIS — J449 Chronic obstructive pulmonary disease, unspecified: Secondary | ICD-10-CM | POA: Diagnosis not present

## 2013-04-01 DIAGNOSIS — I10 Essential (primary) hypertension: Secondary | ICD-10-CM | POA: Diagnosis not present

## 2013-04-01 DIAGNOSIS — K769 Liver disease, unspecified: Secondary | ICD-10-CM | POA: Diagnosis not present

## 2013-04-07 DIAGNOSIS — K7030 Alcoholic cirrhosis of liver without ascites: Secondary | ICD-10-CM | POA: Diagnosis not present

## 2013-04-07 DIAGNOSIS — R220 Localized swelling, mass and lump, head: Secondary | ICD-10-CM | POA: Diagnosis not present

## 2013-04-07 DIAGNOSIS — R1011 Right upper quadrant pain: Secondary | ICD-10-CM | POA: Diagnosis not present

## 2013-04-07 DIAGNOSIS — Z86010 Personal history of colonic polyps: Secondary | ICD-10-CM | POA: Diagnosis not present

## 2013-04-09 ENCOUNTER — Ambulatory Visit: Payer: Self-pay | Admitting: Gastroenterology

## 2013-04-09 DIAGNOSIS — R220 Localized swelling, mass and lump, head: Secondary | ICD-10-CM | POA: Diagnosis not present

## 2013-05-16 ENCOUNTER — Ambulatory Visit: Payer: Self-pay | Admitting: Gastroenterology

## 2013-05-16 DIAGNOSIS — R220 Localized swelling, mass and lump, head: Secondary | ICD-10-CM | POA: Diagnosis not present

## 2013-05-16 DIAGNOSIS — M5382 Other specified dorsopathies, cervical region: Secondary | ICD-10-CM | POA: Diagnosis not present

## 2013-05-16 DIAGNOSIS — R221 Localized swelling, mass and lump, neck: Secondary | ICD-10-CM | POA: Diagnosis not present

## 2013-05-16 LAB — CREATININE, SERUM
Creatinine: 1.75 mg/dL — ABNORMAL HIGH (ref 0.60–1.30)
GFR CALC AF AMER: 52 — AB
GFR CALC NON AF AMER: 45 — AB

## 2013-05-27 ENCOUNTER — Ambulatory Visit: Payer: Self-pay | Admitting: Gastroenterology

## 2013-05-27 DIAGNOSIS — F17200 Nicotine dependence, unspecified, uncomplicated: Secondary | ICD-10-CM | POA: Diagnosis not present

## 2013-05-27 DIAGNOSIS — J449 Chronic obstructive pulmonary disease, unspecified: Secondary | ICD-10-CM | POA: Diagnosis not present

## 2013-05-27 DIAGNOSIS — K5730 Diverticulosis of large intestine without perforation or abscess without bleeding: Secondary | ICD-10-CM | POA: Diagnosis not present

## 2013-05-27 DIAGNOSIS — E119 Type 2 diabetes mellitus without complications: Secondary | ICD-10-CM | POA: Diagnosis not present

## 2013-05-27 DIAGNOSIS — Z79899 Other long term (current) drug therapy: Secondary | ICD-10-CM | POA: Diagnosis not present

## 2013-05-27 DIAGNOSIS — Z860100 Personal history of colon polyps, unspecified: Secondary | ICD-10-CM | POA: Diagnosis not present

## 2013-05-27 DIAGNOSIS — K7030 Alcoholic cirrhosis of liver without ascites: Secondary | ICD-10-CM | POA: Diagnosis not present

## 2013-05-27 DIAGNOSIS — Z8249 Family history of ischemic heart disease and other diseases of the circulatory system: Secondary | ICD-10-CM | POA: Diagnosis not present

## 2013-05-27 DIAGNOSIS — Z5309 Procedure and treatment not carried out because of other contraindication: Secondary | ICD-10-CM | POA: Diagnosis not present

## 2013-05-27 DIAGNOSIS — K620 Anal polyp: Secondary | ICD-10-CM | POA: Diagnosis not present

## 2013-05-27 DIAGNOSIS — Z803 Family history of malignant neoplasm of breast: Secondary | ICD-10-CM | POA: Diagnosis not present

## 2013-05-27 DIAGNOSIS — D126 Benign neoplasm of colon, unspecified: Secondary | ICD-10-CM | POA: Diagnosis not present

## 2013-05-27 DIAGNOSIS — I10 Essential (primary) hypertension: Secondary | ICD-10-CM | POA: Diagnosis not present

## 2013-05-27 DIAGNOSIS — Z09 Encounter for follow-up examination after completed treatment for conditions other than malignant neoplasm: Secondary | ICD-10-CM | POA: Diagnosis not present

## 2013-05-27 DIAGNOSIS — K621 Rectal polyp: Secondary | ICD-10-CM | POA: Diagnosis not present

## 2013-05-27 DIAGNOSIS — K219 Gastro-esophageal reflux disease without esophagitis: Secondary | ICD-10-CM | POA: Diagnosis not present

## 2013-05-27 DIAGNOSIS — G4730 Sleep apnea, unspecified: Secondary | ICD-10-CM | POA: Diagnosis not present

## 2013-05-27 DIAGNOSIS — E669 Obesity, unspecified: Secondary | ICD-10-CM | POA: Diagnosis not present

## 2013-05-27 DIAGNOSIS — Z538 Procedure and treatment not carried out for other reasons: Secondary | ICD-10-CM | POA: Diagnosis not present

## 2013-05-27 LAB — CBC WITH DIFFERENTIAL/PLATELET
Basophil #: 0.1 10*3/uL (ref 0.0–0.1)
Basophil %: 1.2 %
EOS ABS: 0.1 10*3/uL (ref 0.0–0.7)
Eosinophil %: 1.5 %
HCT: 42.4 % (ref 40.0–52.0)
HGB: 14.6 g/dL (ref 13.0–18.0)
Lymphocyte #: 1.1 10*3/uL (ref 1.0–3.6)
Lymphocyte %: 14.7 %
MCH: 34.4 pg — ABNORMAL HIGH (ref 26.0–34.0)
MCHC: 34.5 g/dL (ref 32.0–36.0)
MCV: 100 fL (ref 80–100)
Monocyte #: 0.5 x10 3/mm (ref 0.2–1.0)
Monocyte %: 6.6 %
NEUTROS PCT: 76 %
Neutrophil #: 5.6 10*3/uL (ref 1.4–6.5)
Platelet: 106 10*3/uL — ABNORMAL LOW (ref 150–440)
RBC: 4.25 10*6/uL — AB (ref 4.40–5.90)
RDW: 13.5 % (ref 11.5–14.5)
WBC: 7.3 10*3/uL (ref 3.8–10.6)

## 2013-05-27 LAB — PROTIME-INR
INR: 1
Prothrombin Time: 13.3 secs (ref 11.5–14.7)

## 2013-05-28 ENCOUNTER — Ambulatory Visit: Payer: Self-pay | Admitting: Gastroenterology

## 2013-05-28 DIAGNOSIS — Z860100 Personal history of colon polyps, unspecified: Secondary | ICD-10-CM | POA: Diagnosis not present

## 2013-05-28 DIAGNOSIS — D128 Benign neoplasm of rectum: Secondary | ICD-10-CM | POA: Diagnosis not present

## 2013-05-28 DIAGNOSIS — F17200 Nicotine dependence, unspecified, uncomplicated: Secondary | ICD-10-CM | POA: Diagnosis not present

## 2013-05-28 DIAGNOSIS — E669 Obesity, unspecified: Secondary | ICD-10-CM | POA: Diagnosis not present

## 2013-05-28 DIAGNOSIS — Z79899 Other long term (current) drug therapy: Secondary | ICD-10-CM | POA: Diagnosis not present

## 2013-05-28 DIAGNOSIS — J449 Chronic obstructive pulmonary disease, unspecified: Secondary | ICD-10-CM | POA: Diagnosis not present

## 2013-05-28 DIAGNOSIS — D126 Benign neoplasm of colon, unspecified: Secondary | ICD-10-CM | POA: Diagnosis not present

## 2013-05-28 DIAGNOSIS — K620 Anal polyp: Secondary | ICD-10-CM | POA: Diagnosis not present

## 2013-05-28 DIAGNOSIS — K5730 Diverticulosis of large intestine without perforation or abscess without bleeding: Secondary | ICD-10-CM | POA: Diagnosis not present

## 2013-05-28 DIAGNOSIS — D129 Benign neoplasm of anus and anal canal: Secondary | ICD-10-CM | POA: Diagnosis not present

## 2013-05-28 DIAGNOSIS — I10 Essential (primary) hypertension: Secondary | ICD-10-CM | POA: Diagnosis not present

## 2013-05-28 DIAGNOSIS — Z09 Encounter for follow-up examination after completed treatment for conditions other than malignant neoplasm: Secondary | ICD-10-CM | POA: Diagnosis not present

## 2013-05-28 DIAGNOSIS — Z7982 Long term (current) use of aspirin: Secondary | ICD-10-CM | POA: Diagnosis not present

## 2013-05-28 DIAGNOSIS — G4730 Sleep apnea, unspecified: Secondary | ICD-10-CM | POA: Diagnosis not present

## 2013-05-28 DIAGNOSIS — E119 Type 2 diabetes mellitus without complications: Secondary | ICD-10-CM | POA: Diagnosis not present

## 2013-05-28 DIAGNOSIS — Z538 Procedure and treatment not carried out for other reasons: Secondary | ICD-10-CM | POA: Diagnosis not present

## 2013-05-28 DIAGNOSIS — Q438 Other specified congenital malformations of intestine: Secondary | ICD-10-CM | POA: Diagnosis not present

## 2013-05-30 LAB — PATHOLOGY REPORT

## 2013-07-02 DIAGNOSIS — Z79899 Other long term (current) drug therapy: Secondary | ICD-10-CM | POA: Diagnosis not present

## 2013-07-02 DIAGNOSIS — K759 Inflammatory liver disease, unspecified: Secondary | ICD-10-CM | POA: Diagnosis not present

## 2013-07-02 DIAGNOSIS — K7460 Unspecified cirrhosis of liver: Secondary | ICD-10-CM | POA: Diagnosis not present

## 2013-07-02 DIAGNOSIS — K769 Liver disease, unspecified: Secondary | ICD-10-CM | POA: Diagnosis not present

## 2013-08-04 DIAGNOSIS — N189 Chronic kidney disease, unspecified: Secondary | ICD-10-CM | POA: Diagnosis not present

## 2013-08-07 DIAGNOSIS — N183 Chronic kidney disease, stage 3 unspecified: Secondary | ICD-10-CM | POA: Diagnosis not present

## 2013-08-07 DIAGNOSIS — E119 Type 2 diabetes mellitus without complications: Secondary | ICD-10-CM | POA: Diagnosis not present

## 2013-08-07 DIAGNOSIS — I129 Hypertensive chronic kidney disease with stage 1 through stage 4 chronic kidney disease, or unspecified chronic kidney disease: Secondary | ICD-10-CM | POA: Diagnosis not present

## 2013-08-12 DIAGNOSIS — K7460 Unspecified cirrhosis of liver: Secondary | ICD-10-CM | POA: Diagnosis not present

## 2013-08-19 DIAGNOSIS — F331 Major depressive disorder, recurrent, moderate: Secondary | ICD-10-CM | POA: Diagnosis not present

## 2013-09-02 DIAGNOSIS — K7030 Alcoholic cirrhosis of liver without ascites: Secondary | ICD-10-CM | POA: Diagnosis not present

## 2013-09-02 DIAGNOSIS — M79609 Pain in unspecified limb: Secondary | ICD-10-CM | POA: Diagnosis not present

## 2013-09-09 ENCOUNTER — Ambulatory Visit: Payer: Self-pay | Admitting: Gastroenterology

## 2013-09-09 DIAGNOSIS — R161 Splenomegaly, not elsewhere classified: Secondary | ICD-10-CM | POA: Diagnosis not present

## 2013-09-09 DIAGNOSIS — K7460 Unspecified cirrhosis of liver: Secondary | ICD-10-CM | POA: Diagnosis not present

## 2013-09-09 DIAGNOSIS — Z9089 Acquired absence of other organs: Secondary | ICD-10-CM | POA: Diagnosis not present

## 2013-10-10 DIAGNOSIS — R918 Other nonspecific abnormal finding of lung field: Secondary | ICD-10-CM | POA: Diagnosis not present

## 2013-10-10 DIAGNOSIS — G4730 Sleep apnea, unspecified: Secondary | ICD-10-CM | POA: Diagnosis not present

## 2013-10-10 DIAGNOSIS — J449 Chronic obstructive pulmonary disease, unspecified: Secondary | ICD-10-CM | POA: Diagnosis not present

## 2013-10-14 DIAGNOSIS — K7460 Unspecified cirrhosis of liver: Secondary | ICD-10-CM | POA: Diagnosis not present

## 2013-12-02 DIAGNOSIS — K7460 Unspecified cirrhosis of liver: Secondary | ICD-10-CM | POA: Diagnosis not present

## 2013-12-13 ENCOUNTER — Emergency Department (HOSPITAL_COMMUNITY): Payer: Medicare Other

## 2013-12-13 ENCOUNTER — Emergency Department (HOSPITAL_COMMUNITY)
Admission: EM | Admit: 2013-12-13 | Discharge: 2013-12-13 | Disposition: A | Discharge status: Home / Self Care | Payer: Medicare Other | Attending: Emergency Medicine | Admitting: Emergency Medicine

## 2013-12-13 ENCOUNTER — Encounter (HOSPITAL_COMMUNITY): Payer: Self-pay | Admitting: Emergency Medicine

## 2013-12-13 DIAGNOSIS — J441 Chronic obstructive pulmonary disease with (acute) exacerbation: Secondary | ICD-10-CM | POA: Diagnosis not present

## 2013-12-13 DIAGNOSIS — F17200 Nicotine dependence, unspecified, uncomplicated: Secondary | ICD-10-CM | POA: Diagnosis not present

## 2013-12-13 DIAGNOSIS — I10 Essential (primary) hypertension: Secondary | ICD-10-CM | POA: Insufficient documentation

## 2013-12-13 DIAGNOSIS — J410 Simple chronic bronchitis: Secondary | ICD-10-CM | POA: Diagnosis not present

## 2013-12-13 DIAGNOSIS — R0602 Shortness of breath: Secondary | ICD-10-CM | POA: Diagnosis not present

## 2013-12-13 HISTORY — DX: Essential (primary) hypertension: I10

## 2013-12-13 HISTORY — DX: Unspecified cirrhosis of liver: K74.60

## 2013-12-13 HISTORY — DX: Chronic obstructive pulmonary disease, unspecified: J44.9

## 2013-12-13 LAB — COMPREHENSIVE METABOLIC PANEL
ALT: 28 U/L (ref 0–53)
ANION GAP: 9 (ref 5–15)
AST: 22 U/L (ref 0–37)
Albumin: 3.6 g/dL (ref 3.5–5.2)
Alkaline Phosphatase: 81 U/L (ref 39–117)
BUN: 25 mg/dL — AB (ref 6–23)
CO2: 31 mEq/L (ref 19–32)
CREATININE: 1.39 mg/dL — AB (ref 0.50–1.35)
Calcium: 9 mg/dL (ref 8.4–10.5)
Chloride: 99 mEq/L (ref 96–112)
GFR calc Af Amer: 67 mL/min — ABNORMAL LOW (ref >90)
GFR calc non Af Amer: 58 mL/min — ABNORMAL LOW (ref >90)
Glucose, Bld: 94 mg/dL (ref 70–99)
POTASSIUM: 4.9 meq/L (ref 3.7–5.3)
Sodium: 139 mEq/L (ref 137–147)
TOTAL PROTEIN: 7.1 g/dL (ref 6.0–8.3)
Total Bilirubin: 1.5 mg/dL — ABNORMAL HIGH (ref 0.3–1.2)

## 2013-12-13 LAB — CBC WITH DIFFERENTIAL/PLATELET
BASOS ABS: 0 10*3/uL (ref 0.0–0.1)
BASOS PCT: 0 % (ref 0–1)
Eosinophils Absolute: 0 10*3/uL (ref 0.0–0.7)
Eosinophils Relative: 1 % (ref 0–5)
HCT: 46 % (ref 39.0–52.0)
Hemoglobin: 15.8 g/dL (ref 13.0–17.0)
Lymphocytes Relative: 11 % — ABNORMAL LOW (ref 12–46)
Lymphs Abs: 1 10*3/uL (ref 0.7–4.0)
MCH: 34.1 pg — AB (ref 26.0–34.0)
MCHC: 34.3 g/dL (ref 30.0–36.0)
MCV: 99.4 fL (ref 78.0–100.0)
MONO ABS: 0.6 10*3/uL (ref 0.1–1.0)
Monocytes Relative: 6 % (ref 3–12)
NEUTROS ABS: 7.3 10*3/uL (ref 1.7–7.7)
NEUTROS PCT: 82 % — AB (ref 43–77)
Platelets: 84 10*3/uL — ABNORMAL LOW (ref 150–400)
RBC: 4.63 MIL/uL (ref 4.22–5.81)
RDW: 13.8 % (ref 11.5–15.5)
WBC: 8.9 10*3/uL (ref 4.0–10.5)

## 2013-12-13 LAB — TROPONIN I: Troponin I: <0.3 ng/mL (ref <0.30)

## 2013-12-13 LAB — PRO B NATRIURETIC PEPTIDE: Pro B Natriuretic peptide (BNP): 334.9 pg/mL — ABNORMAL HIGH (ref 0–125)

## 2013-12-13 MED ORDER — METHYLPREDNISOLONE SODIUM SUCC 125 MG IJ SOLR
125.0000 mg | Freq: Once | INTRAMUSCULAR | Status: AC
  Administered 2013-12-13: 125 mg via INTRAVENOUS
  Filled 2013-12-13: qty 2

## 2013-12-13 MED ORDER — DOXYCYCLINE HYCLATE 100 MG PO CAPS: 100.0000 mg | ORAL_CAPSULE | Freq: Two times a day (BID) | ORAL | Status: DC

## 2013-12-13 MED ORDER — IPRATROPIUM-ALBUTEROL 0.5-2.5 (3) MG/3ML IN SOLN
3.0000 mL | Freq: Once | RESPIRATORY_TRACT | Status: AC
  Administered 2013-12-13: 3 mL via RESPIRATORY_TRACT
  Filled 2013-12-13: qty 3

## 2013-12-13 MED ORDER — ALBUTEROL SULFATE (2.5 MG/3ML) 0.083% IN NEBU
2.5000 mg | INHALATION_SOLUTION | Freq: Once | RESPIRATORY_TRACT | Status: AC
  Administered 2013-12-13: 2.5 mg via RESPIRATORY_TRACT
  Filled 2013-12-13: qty 3

## 2013-12-13 NOTE — Discharge Instructions (Signed)
Increase prednisone to 20mg  a day for 5 days.  Follow up as needed

## 2013-12-13 NOTE — ED Notes (Signed)
Pt states, "It's been hard to breathe since yesterday" Pt states clear, productive cough. Also states he had nebs x 3 without relief. NAD>

## 2013-12-13 NOTE — ED Notes (Signed)
Pt BIB EMS from assist living for SOB that started yesterday, worsened today. PMH of asthma, COPD. EMS gave 2 albuterol and 1 duoneb in route to hospital. 20g Rt Hand by EMS.

## 2013-12-13 NOTE — ED Notes (Signed)
D/C instruction give to pt and to caregiver [place] from pts group home.

## 2013-12-13 NOTE — ED Provider Notes (Signed)
CSN: [id number]     Arrival date & time 12/13/13  1805 History  This chart was scribed for Jorge Boss, MD by Girtha Hake, ED Scribe. The patient was seen in [place]. The patient's care was started at 6:20 PM.   Chief Complaint  Patient presents with  . Shortness of Breath    Patient is a 48 y.o. male presenting with shortness of breath. The history is provided by the patient (the pt complains of sob). No language interpreter was used.  Shortness of Breath Severity:  Moderate Onset quality:  Gradual Timing:  Constant Progression:  Waxing and waning Chronicity:  Recurrent Context: activity   Associated symptoms: cough and fever   Associated symptoms: no abdominal pain, no chest pain, no headaches and no rash    HPI Comments: Jorge [name] is a 48 y.o. male with a history of COPD and smoking who presents to the Emergency Department complaining of gradual-onset SOB beginning earlier today. Patient reports an associated productive cough and fever. Patient uses a nebulizer and puffer at home, but does not know which medications he uses. He reports that he has used his nebulizer twice today. Patient denies abdominal pain or leg swelling. He arrived at the ED by EMS.    Pulmonologist is in [place]. PCP is Dr. Legrand Rams.   Past Medical History  Diagnosis Date  . COPD (chronic obstructive pulmonary disease)   . Cirrhosis   . Hypertension    Past Surgical History  Procedure Laterality Date  . Cholecystectomy     No family history on file. History  Substance Use Topics  . Smoking status: Current Every Day Smoker    Types: Cigarettes  . Smokeless tobacco: Not on file  . Alcohol Use: No    Review of Systems  Constitutional: Positive for fever. Negative for appetite change and fatigue.  HENT: Negative for congestion, ear discharge and sinus pressure.   Eyes: Negative for discharge.  Respiratory: Positive for cough and shortness of breath.   Cardiovascular: Negative for  chest pain and leg swelling.  Gastrointestinal: Negative for abdominal pain and diarrhea.  Genitourinary: Negative for frequency and hematuria.  Musculoskeletal: Negative for back pain.  Skin: Negative for rash.  Neurological: Negative for seizures and headaches.  Psychiatric/Behavioral: Negative for hallucinations.      Allergies  Review of patient's allergies indicates no known allergies.  Home Medications   Prior to Admission medications   Not on File   There were no vitals taken for this visit. Physical Exam  Constitutional: He is oriented to person, place, and time. He appears well-developed.  HENT:  Head: Normocephalic.  Eyes: Conjunctivae and EOM are normal. No scleral icterus.  Neck: Neck supple. No thyromegaly present.  Cardiovascular: Normal rate and regular rhythm.  Exam reveals no gallop and no friction rub.   No murmur heard. Pulmonary/Chest: No stridor. He has wheezes. He has no rales. He exhibits no tenderness.  Decreased breath sounds with diffuse expiratory wheezes.  Abdominal: Bowel sounds are normal. He exhibits distension. There is no tenderness. There is no rebound.  Abdomen is diffusely mildly distended vs. Obese.  Musculoskeletal: Normal range of motion. He exhibits no edema.  Lymphadenopathy:    He has no cervical adenopathy.  Neurological: He is oriented to person, place, and time. He exhibits normal muscle tone. Coordination normal.  Skin: No rash noted. No erythema.  Psychiatric: He has a normal mood and affect. His behavior is normal.    ED Course  Procedures (including  critical care time) DIAGNOSTIC STUDIES:   COORDINATION OF CARE: 6:24 PM-Discussed treatment plan which includes a CXR, Solu-Medrol, Duoneb, Proventil, EKG, and labs with pt at bedside and pt agreed to plan.     Labs Review Labs Reviewed - No data to display  Imaging Review No results found.   EKG Interpretation None      MDM   Final diagnoses:  None    The  chart was scribed for me under my direct supervision.  I personally performed the history, physical, and medical decision making and all procedures in the evaluation of this patient.Maudry Diego, MD 12/13/13 [id number]

## 2013-12-23 DIAGNOSIS — E119 Type 2 diabetes mellitus without complications: Secondary | ICD-10-CM | POA: Diagnosis not present

## 2013-12-23 DIAGNOSIS — K7460 Unspecified cirrhosis of liver: Secondary | ICD-10-CM | POA: Diagnosis not present

## 2013-12-23 DIAGNOSIS — K769 Liver disease, unspecified: Secondary | ICD-10-CM | POA: Diagnosis not present

## 2014-01-08 ENCOUNTER — Emergency Department (HOSPITAL_COMMUNITY): Payer: Medicare Other

## 2014-01-08 ENCOUNTER — Encounter (HOSPITAL_COMMUNITY): Payer: Self-pay | Admitting: Emergency Medicine

## 2014-01-08 ENCOUNTER — Emergency Department (HOSPITAL_COMMUNITY)
Admission: EM | Admit: 2014-01-08 | Discharge: 2014-01-08 | Disposition: A | Discharge status: Home / Self Care | Payer: Medicare Other | Attending: Emergency Medicine | Admitting: Emergency Medicine

## 2014-01-08 DIAGNOSIS — IMO0002 Reserved for concepts with insufficient information to code with codable children: Secondary | ICD-10-CM | POA: Diagnosis not present

## 2014-01-08 DIAGNOSIS — K089 Disorder of teeth and supporting structures, unspecified: Secondary | ICD-10-CM | POA: Diagnosis not present

## 2014-01-08 DIAGNOSIS — R059 Cough, unspecified: Secondary | ICD-10-CM | POA: Diagnosis present

## 2014-01-08 DIAGNOSIS — R079 Chest pain, unspecified: Secondary | ICD-10-CM | POA: Diagnosis not present

## 2014-01-08 DIAGNOSIS — J209 Acute bronchitis, unspecified: Secondary | ICD-10-CM | POA: Diagnosis not present

## 2014-01-08 DIAGNOSIS — K7030 Alcoholic cirrhosis of liver without ascites: Secondary | ICD-10-CM | POA: Diagnosis not present

## 2014-01-08 DIAGNOSIS — Z792 Long term (current) use of antibiotics: Secondary | ICD-10-CM | POA: Insufficient documentation

## 2014-01-08 DIAGNOSIS — I10 Essential (primary) hypertension: Secondary | ICD-10-CM | POA: Diagnosis not present

## 2014-01-08 DIAGNOSIS — R05 Cough: Secondary | ICD-10-CM | POA: Diagnosis present

## 2014-01-08 DIAGNOSIS — J441 Chronic obstructive pulmonary disease with (acute) exacerbation: Secondary | ICD-10-CM | POA: Diagnosis not present

## 2014-01-08 DIAGNOSIS — Z8719 Personal history of other diseases of the digestive system: Secondary | ICD-10-CM | POA: Diagnosis not present

## 2014-01-08 DIAGNOSIS — Z79899 Other long term (current) drug therapy: Secondary | ICD-10-CM | POA: Diagnosis not present

## 2014-01-08 DIAGNOSIS — Z880 Allergy status to penicillin: Secondary | ICD-10-CM | POA: Diagnosis not present

## 2014-01-08 DIAGNOSIS — F17200 Nicotine dependence, unspecified, uncomplicated: Secondary | ICD-10-CM | POA: Insufficient documentation

## 2014-01-08 DIAGNOSIS — Z0389 Encounter for observation for other suspected diseases and conditions ruled out: Secondary | ICD-10-CM | POA: Diagnosis not present

## 2014-01-08 LAB — CBC WITH DIFFERENTIAL/PLATELET
Basophils Absolute: 0 10*3/uL (ref 0.0–0.1)
Basophils Relative: 0 % (ref 0–1)
Eosinophils Absolute: 0 10*3/uL (ref 0.0–0.7)
Eosinophils Relative: 1 % (ref 0–5)
HCT: 43.7 % (ref 39.0–52.0)
HEMOGLOBIN: 14.9 g/dL (ref 13.0–17.0)
LYMPHS PCT: 17 % (ref 12–46)
Lymphs Abs: 0.8 10*3/uL (ref 0.7–4.0)
MCH: 33.5 pg (ref 26.0–34.0)
MCHC: 34.1 g/dL (ref 30.0–36.0)
MCV: 98.2 fL (ref 78.0–100.0)
MONOS PCT: 6 % (ref 3–12)
Monocytes Absolute: 0.3 10*3/uL (ref 0.1–1.0)
NEUTROS ABS: 3.8 10*3/uL (ref 1.7–7.7)
Neutrophils Relative %: 76 % (ref 43–77)
PLATELETS: 77 10*3/uL — AB (ref 150–400)
RBC: 4.45 MIL/uL (ref 4.22–5.81)
RDW: 13.8 % (ref 11.5–15.5)
WBC: 4.9 10*3/uL (ref 4.0–10.5)

## 2014-01-08 LAB — BASIC METABOLIC PANEL
Anion gap: 9 (ref 5–15)
BUN: 32 mg/dL — ABNORMAL HIGH (ref 6–23)
CHLORIDE: 104 meq/L (ref 96–112)
CO2: 28 mEq/L (ref 19–32)
Calcium: 8.9 mg/dL (ref 8.4–10.5)
Creatinine, Ser: 1.75 mg/dL — ABNORMAL HIGH (ref 0.50–1.35)
GFR calc Af Amer: 51 mL/min — ABNORMAL LOW (ref >90)
GFR, EST NON AFRICAN AMERICAN: 44 mL/min — AB (ref >90)
GLUCOSE: 100 mg/dL — AB (ref 70–99)
POTASSIUM: 4.8 meq/L (ref 3.7–5.3)
SODIUM: 141 meq/L (ref 137–147)

## 2014-01-08 LAB — TROPONIN I: Troponin I: <0.3 ng/mL (ref <0.30)

## 2014-01-08 MED ORDER — ALBUTEROL (5 MG/ML) CONTINUOUS INHALATION SOLN
15.0000 mg/h | INHALATION_SOLUTION | RESPIRATORY_TRACT | Status: DC
  Administered 2014-01-08: 15 mg/h via RESPIRATORY_TRACT
  Filled 2014-01-08: qty 20

## 2014-01-08 MED ORDER — PREDNISONE 20 MG PO TABS: 60.0000 mg | ORAL_TABLET | Freq: Every day | ORAL | Status: DC

## 2014-01-08 MED ORDER — IPRATROPIUM-ALBUTEROL 0.5-2.5 (3) MG/3ML IN SOLN
3.0000 mL | Freq: Once | RESPIRATORY_TRACT | Status: AC
  Administered 2014-01-08: 3 mL via RESPIRATORY_TRACT
  Filled 2014-01-08: qty 3

## 2014-01-08 MED ORDER — METHYLPREDNISOLONE SODIUM SUCC 125 MG IJ SOLR
125.0000 mg | Freq: Once | INTRAMUSCULAR | Status: AC
  Administered 2014-01-08: 125 mg via INTRAVENOUS
  Filled 2014-01-08: qty 2

## 2014-01-08 NOTE — ED Notes (Signed)
MD at bedside. 

## 2014-01-08 NOTE — Discharge Instructions (Signed)
Please use your albuterol nebulizer every 4 hours while awake for the next 48 hours and then every 4 hours as needed.  Chronic Obstructive Pulmonary Disease Exacerbation Chronic obstructive pulmonary disease (COPD) is a common lung condition in which airflow from the lungs is limited. COPD is a general term that can be used to describe many different lung problems that limit airflow, including chronic bronchitis and emphysema. COPD exacerbations are episodes when breathing symptoms become much worse and require extra treatment. Without treatment, COPD exacerbations can be life threatening, and frequent COPD exacerbations can cause further damage to your lungs. CAUSES   Respiratory infections.   Exposure to smoke.   Exposure to air pollution, chemical fumes, or dust. Sometimes there is no apparent cause or trigger. RISK FACTORS  Smoking cigarettes.  Older age.  Frequent prior COPD exacerbations. SIGNS AND SYMPTOMS   Increased coughing.   Increased thick spit (sputum) production.   Increased wheezing.   Increased shortness of breath.   Rapid breathing.   Chest tightness. DIAGNOSIS  Your medical history, a physical exam, and tests will help your health care provider make a diagnosis. Tests may include:  A chest X-ray.  Basic lab tests.  Sputum testing.  An arterial blood gas test. TREATMENT  Depending on the severity of your COPD exacerbation, you may need to be admitted to a hospital for treatment. Some of the treatments commonly used to treat COPD exacerbations are:   Antibiotic medicines.   Bronchodilators. These are drugs that expand the air passages. They may be given with an inhaler or nebulizer. Spacer devices may be needed to help improve drug delivery.  Corticosteroid medicines.  Supplemental oxygen therapy.  HOME CARE INSTRUCTIONS   Do not smoke. Quitting smoking is very important to prevent COPD from getting worse and exacerbations from happening  as often.  Avoid exposure to all substances that irritate the airway, especially to tobacco smoke.   If you were prescribed an antibiotic medicine, finish it all even if you start to feel better.  Take all medicines as directed by your health care provider.It is important to use correct technique with inhaled medicines.  Drink enough fluids to keep your urine clear or pale yellow (unless you have a medical condition that requires fluid restriction).  Use a cool mist vaporizer. This makes it easier to clear your chest when you cough.   If you have a home nebulizer and oxygen, continue to use them as directed.   Maintain all necessary vaccinations to prevent infections.   Exercise regularly.   Eat a healthy diet.   Keep all follow-up appointments as directed by your health care provider. SEEK IMMEDIATE MEDICAL CARE IF:  You have worsening shortness of breath.   You have trouble talking.   You have severe chest pain.  You have blood in your sputum.  You have a fever.  You have weakness, vomit repeatedly, or faint.   You feel confused.   You continue to get worse. MAKE SURE YOU:   Understand these instructions.  Will watch your condition.  Will get help right away if you are not doing well or get worse. Document Released: 02/26/2007 Document Revised: 09/15/2013 Document Reviewed: 01/03/2013 [facility] Patient Information 2015 [place], [place]. This information is not intended to replace advice given to you by your health care provider. Make sure you discuss any questions you have with your health care provider.

## 2014-01-08 NOTE — ED Notes (Signed)
DR ward and Arther Dames RN aware of pt vs. Pt states he has chest tightness but is the same as when last came in and has not gone away. Non diaphoretic.

## 2014-01-08 NOTE — ED Notes (Signed)
Archer Asa to pick up pt when ready [phone]

## 2014-01-08 NOTE — ED Notes (Signed)
Pt here onemonth ago for "bronchitis". States feels same and ran out of anitbiotics. Pt educated. C/o white phlegm. Nad.

## 2014-01-08 NOTE — ED Provider Notes (Signed)
This chart was scribed for [place], DO by Terressa Koyanagi, ED Scribe. This patient was seen in room APA10/APA10 and the patient's care was started at 4:07 PM.   TIME SEEN: 4:07 PM   CHIEF COMPLAINT:  Chief Complaint  Patient presents with  . Cough     HPI:  PCP: Rosita Fire, MD  HPI Comments: Jorge [name] is a 48 y.o. male, with Hx of COPD, everyday tobacco use, HTN and cirrhosis, who presents to the Emergency Department complaining of an intermittent productive cough (with white sputum) with associated SOB and chest pain onset one month ago. Pt states the coughing causes his chest pain and he describes as a "toothache". It is not exertional or pleuritic. Pt reports he has been using a nebulizer and inhalers for his cough at home. Pt denies Hx of PE or DVT, recent hospitalization or prolonged travel, fracture, surgery, trauma. He was hypertensive in triage but this may have been an erroneous reading as he is normotensive in the ED. He denies any headache, blurry vision, numbness, tingling or focal weakness.    During exam pt's BP is 129/72.    ROS: See HPI Constitutional: no fever  Eyes: no drainage  ENT: no runny nose   Cardiovascular: chest pain (caused by cough)  Resp: no SOB  GI: no vomiting GU: no dysuria Integumentary: no rash  Allergy: no hives  Musculoskeletal: no leg swelling  Neurological: no slurred speech ROS otherwise negative  PAST MEDICAL HISTORY/PAST SURGICAL HISTORY:  Past Medical History  Diagnosis Date  . COPD (chronic obstructive pulmonary disease)   . Cirrhosis   . Hypertension     MEDICATIONS:  Prior to Admission medications   Medication Sig Start Date End Date Taking? Authorizing Provider  ARIPiprazole (ABILIFY) 30 MG tablet Take 30 mg by mouth daily. 12/03/13   Historical Provider, MD  citalopram (CELEXA) 40 MG tablet Take 40 mg by mouth daily. 12/03/13   Historical Provider, MD  doxycycline (VIBRAMYCIN) 100 MG capsule Take 1 capsule  (100 mg total) by mouth 2 (two) times daily. One po bid x 7 days 12/13/13   Maudry Diego, MD  furosemide (LASIX) 40 MG tablet Take 40 mg by mouth daily. 12/03/13   Historical Provider, MD  gabapentin (NEURONTIN) 300 MG capsule Take 300 mg by mouth 3 (three) times daily. 12/03/13   Historical Provider, MD  nadolol (CORGARD) 20 MG tablet Take 20 mg by mouth daily. 12/03/13   Historical Provider, MD  pantoprazole (PROTONIX) 40 MG tablet Take 40 mg by mouth daily. 12/03/13   Historical Provider, MD  predniSONE (DELTASONE) 10 MG tablet Take 10 mg by mouth daily. 12/03/13   Historical Provider, MD  PROAIR HFA 108 (90 BASE) MCG/ACT inhaler Inhale 2 puffs into the lungs every 4 (four) hours as needed. Shortness of breath/wheeze 12/05/13   Historical Provider, MD  SPIRIVA HANDIHALER 18 MCG inhalation capsule Place 1 capsule into inhaler and inhale daily. 12/05/13   Historical Provider, MD  spironolactone (ALDACTONE) 25 MG tablet Take 25 mg by mouth 2 (two) times daily. 12/03/13   Historical Provider, MD  SYMBICORT 160-4.5 MCG/ACT inhaler Inhale 1 puff into the lungs 2 (two) times daily. 12/08/13   Historical Provider, MD  traZODone (DESYREL) 150 MG tablet Take 150 mg by mouth at bedtime. 12/03/13   Historical Provider, MD  XIFAXAN 550 MG TABS tablet Take 550 mg by mouth 2 (two) times daily. 12/03/13   Historical Provider, MD    ALLERGIES:  Allergies  Allergen Reactions  . Penicillins Anaphylaxis    Patient states he is not allergic to anything;  Pt says it's his twin brother that has this allergy.  . Ciprofloxacin     Other reaction(s): JOINT PAIN  PATIENT STATES HE IS NOT ALLERGIC TO ANYTHING; pt says it is his twin brother that has this allergy.    SOCIAL HISTORY:  History  Substance Use Topics  . Smoking status: Current Every Day Smoker    Types: Cigarettes  . Smokeless tobacco: Not on file  . Alcohol Use: No    FAMILY HISTORY: History reviewed. No pertinent family history.  EXAM: Triage Vitals:  BP 181/161  Pulse 59  Temp(Src) 98.5 F (36.9 C) (Oral)  Resp 24  SpO2 96% CONSTITUTIONAL: Alert and oriented and responds appropriately to questions. Well-appearing; well-nourished HEAD: Normocephalic EYES: Conjunctivae clear, PERRL ENT: normal nose; no rhinorrhea; moist mucous membranes; pharynx without lesions noted NECK: Supple, no meningismus, no LAD  CARD: RRR; S1 and S2 appreciated; no murmurs, no clicks, no rubs, no gallops RESP: Normal chest excursion without splinting. Mildly tachypnic, diminished at bases, diffused expiratory wheezing,. No hypoxia, no respiratory distress, speaking in full sentences. ABD/GI: Normal bowel sounds; non-distended; soft, non-tender, no rebound, no guarding BACK:  The back appears normal and is non-tender to palpation, there is no CVA tenderness EXT: Normal ROM in all joints; non-tender to palpation; no edema; normal capillary refill; no cyanosis    SKIN: Normal color for age and race; warm NEURO: Moves all extremities equally PSYCH: The patient's mood and manner are appropriate. Grooming and personal hygiene are appropriate.     MEDICAL DECISION MAKING: Patient here with COPD exacerbation. He is diminished. We'll give duo neb, Solu-Medrol. We'll obtain labs and chest x-ray. EKG shows no ischemic changes. He is not hypoxic and in no respiratory distress. Speaking full sentences.  ED PROGRESS: Pt's lungs have now opened up more and he is having more diffuse expiratory wheezing. His labs are unremarkable including a normal troponin and clear x-ray. We'll give continuous albuterol and reassess.   After continuous albuterol, patient reports that he is feeling much better and is ready for discharge home. He is to have some minimal expiratory wheeze. He states he has albuterol nebulizer at home. Have instructed him to continue this every 4 hours for the next 48 hours and then as needed. We'll discharge with prescription for steroid burst. I do not feel he  needs antibiotics at this time. Discussed return precautions. Patient verbalizes understanding and is comfortable with plan.    EKG Interpretation  Date/Time:  Thursday January 08 2014 17:05:38 EDT Ventricular Rate:  56 PR Interval:  192 QRS Duration: 99 QT Interval:  436 QTC Calculation: 421 R Axis:   56 Text Interpretation:  Sinus rhythm Low voltage, precordial leads Borderline T wave abnormalities Baseline wander in lead(s) V5 No significant change since last tracing Confirmed by Alta Goding,  DO, Dotti Busey [phone]) on 01/08/2014 5:10:12 PM         I personally performed the services described in this documentation, which was scribed in my presence. The recorded information has been reviewed and is accurate.   [place], DO 01/08/14 1943

## 2014-01-16 DIAGNOSIS — F331 Major depressive disorder, recurrent, moderate: Secondary | ICD-10-CM | POA: Diagnosis not present

## 2014-01-26 DIAGNOSIS — E119 Type 2 diabetes mellitus without complications: Secondary | ICD-10-CM | POA: Diagnosis not present

## 2014-01-26 DIAGNOSIS — N183 Chronic kidney disease, stage 3 unspecified: Secondary | ICD-10-CM | POA: Diagnosis not present

## 2014-01-26 DIAGNOSIS — K7030 Alcoholic cirrhosis of liver without ascites: Secondary | ICD-10-CM | POA: Diagnosis not present

## 2014-02-13 DIAGNOSIS — K769 Liver disease, unspecified: Secondary | ICD-10-CM | POA: Diagnosis not present

## 2014-03-18 ENCOUNTER — Encounter (HOSPITAL_COMMUNITY): Payer: Self-pay | Admitting: Emergency Medicine

## 2014-03-18 ENCOUNTER — Emergency Department (HOSPITAL_COMMUNITY)
Admission: EM | Admit: 2014-03-18 | Discharge: 2014-03-18 | Disposition: A | Discharge status: Home / Self Care | Payer: Medicare Other | Attending: Emergency Medicine | Admitting: Emergency Medicine

## 2014-03-18 ENCOUNTER — Emergency Department (HOSPITAL_COMMUNITY): Payer: Medicare Other

## 2014-03-18 DIAGNOSIS — Z79899 Other long term (current) drug therapy: Secondary | ICD-10-CM | POA: Insufficient documentation

## 2014-03-18 DIAGNOSIS — I951 Orthostatic hypotension: Secondary | ICD-10-CM | POA: Diagnosis not present

## 2014-03-18 DIAGNOSIS — Z792 Long term (current) use of antibiotics: Secondary | ICD-10-CM | POA: Diagnosis not present

## 2014-03-18 DIAGNOSIS — I10 Essential (primary) hypertension: Secondary | ICD-10-CM | POA: Diagnosis not present

## 2014-03-18 DIAGNOSIS — Z7952 Long term (current) use of systemic steroids: Secondary | ICD-10-CM | POA: Insufficient documentation

## 2014-03-18 DIAGNOSIS — R531 Weakness: Secondary | ICD-10-CM

## 2014-03-18 DIAGNOSIS — J441 Chronic obstructive pulmonary disease with (acute) exacerbation: Secondary | ICD-10-CM | POA: Insufficient documentation

## 2014-03-18 DIAGNOSIS — E119 Type 2 diabetes mellitus without complications: Secondary | ICD-10-CM | POA: Diagnosis not present

## 2014-03-18 DIAGNOSIS — R631 Polydipsia: Secondary | ICD-10-CM | POA: Diagnosis not present

## 2014-03-18 DIAGNOSIS — Z880 Allergy status to penicillin: Secondary | ICD-10-CM | POA: Insufficient documentation

## 2014-03-18 DIAGNOSIS — R42 Dizziness and giddiness: Secondary | ICD-10-CM | POA: Diagnosis not present

## 2014-03-18 DIAGNOSIS — E860 Dehydration: Secondary | ICD-10-CM | POA: Diagnosis not present

## 2014-03-18 DIAGNOSIS — J449 Chronic obstructive pulmonary disease, unspecified: Secondary | ICD-10-CM | POA: Diagnosis not present

## 2014-03-18 DIAGNOSIS — Z8719 Personal history of other diseases of the digestive system: Secondary | ICD-10-CM | POA: Insufficient documentation

## 2014-03-18 DIAGNOSIS — R918 Other nonspecific abnormal finding of lung field: Secondary | ICD-10-CM | POA: Diagnosis not present

## 2014-03-18 DIAGNOSIS — K7030 Alcoholic cirrhosis of liver without ascites: Secondary | ICD-10-CM | POA: Diagnosis not present

## 2014-03-18 LAB — URINALYSIS, ROUTINE W REFLEX MICROSCOPIC
BILIRUBIN URINE: NEGATIVE
Glucose, UA: NEGATIVE mg/dL
Hgb urine dipstick: NEGATIVE
Ketones, ur: NEGATIVE mg/dL
Leukocytes, UA: NEGATIVE
NITRITE: NEGATIVE
PH: 5 (ref 5.0–8.0)
Protein, ur: NEGATIVE mg/dL
Specific Gravity, Urine: 1.015 (ref 1.005–1.030)
UROBILINOGEN UA: 0.2 mg/dL (ref 0.0–1.0)

## 2014-03-18 LAB — HEPATIC FUNCTION PANEL
ALT: 20 U/L (ref 0–53)
AST: 18 U/L (ref 0–37)
Albumin: 3.9 g/dL (ref 3.5–5.2)
Alkaline Phosphatase: 83 U/L (ref 39–117)
Bilirubin, Direct: 0.2 mg/dL (ref 0.0–0.3)
Indirect Bilirubin: 0.9 mg/dL (ref 0.3–0.9)
Total Bilirubin: 1.1 mg/dL (ref 0.3–1.2)
Total Protein: 7.7 g/dL (ref 6.0–8.3)

## 2014-03-18 LAB — BASIC METABOLIC PANEL
ANION GAP: 12 (ref 5–15)
BUN: 40 mg/dL — ABNORMAL HIGH (ref 6–23)
CHLORIDE: 101 meq/L (ref 96–112)
CO2: 26 mEq/L (ref 19–32)
Calcium: 9.1 mg/dL (ref 8.4–10.5)
Creatinine, Ser: 1.68 mg/dL — ABNORMAL HIGH (ref 0.50–1.35)
GFR calc Af Amer: 53 mL/min — ABNORMAL LOW (ref >90)
GFR calc non Af Amer: 46 mL/min — ABNORMAL LOW (ref >90)
Glucose, Bld: 124 mg/dL — ABNORMAL HIGH (ref 70–99)
POTASSIUM: 4.9 meq/L (ref 3.7–5.3)
Sodium: 139 mEq/L (ref 137–147)

## 2014-03-18 LAB — PRO B NATRIURETIC PEPTIDE: PRO B NATRI PEPTIDE: 209.9 pg/mL — AB (ref 0–125)

## 2014-03-18 LAB — CBC
HCT: 43.6 % (ref 39.0–52.0)
Hemoglobin: 15 g/dL (ref 13.0–17.0)
MCH: 34.5 pg — ABNORMAL HIGH (ref 26.0–34.0)
MCHC: 34.4 g/dL (ref 30.0–36.0)
MCV: 100.2 fL — ABNORMAL HIGH (ref 78.0–100.0)
PLATELETS: 128 10*3/uL — AB (ref 150–400)
RBC: 4.35 MIL/uL (ref 4.22–5.81)
RDW: 14.4 % (ref 11.5–15.5)
WBC: 7.5 10*3/uL (ref 4.0–10.5)

## 2014-03-18 LAB — CBG MONITORING, ED: Glucose-Capillary: 114 mg/dL — ABNORMAL HIGH (ref 70–99)

## 2014-03-18 LAB — TROPONIN I: Troponin I: <0.3 ng/mL (ref <0.30)

## 2014-03-18 MED ORDER — IPRATROPIUM-ALBUTEROL 0.5-2.5 (3) MG/3ML IN SOLN
3.0000 mL | Freq: Once | RESPIRATORY_TRACT | Status: AC
  Administered 2014-03-18: 3 mL via RESPIRATORY_TRACT
  Filled 2014-03-18: qty 3

## 2014-03-18 MED ORDER — SODIUM CHLORIDE 0.9 % IV BOLUS (SEPSIS)
500.0000 mL | Freq: Once | INTRAVENOUS | Status: AC
  Administered 2014-03-18: 500 mL via INTRAVENOUS

## 2014-03-18 MED ORDER — ALBUTEROL SULFATE (2.5 MG/3ML) 0.083% IN NEBU
2.5000 mg | INHALATION_SOLUTION | Freq: Once | RESPIRATORY_TRACT | Status: AC
  Administered 2014-03-18: 2.5 mg via RESPIRATORY_TRACT
  Filled 2014-03-18: qty 3

## 2014-03-18 NOTE — ED Notes (Addendum)
Pt reports generalized weakness, dizziness, nausea x2-3 weeks. Pt sent here from Dr. Josephine Cables office.

## 2014-03-18 NOTE — Discharge Instructions (Signed)
Follow up with your md in 2-3 days °

## 2014-03-18 NOTE — ED Notes (Signed)
While performing orthostatics, patient said he was feeling dizzy.

## 2014-03-18 NOTE — ED Provider Notes (Signed)
CSN: [id number]     Arrival date & time 03/18/14  1228 History  This chart was scribed for Jorge Diego, MD by Zola Button, ED Scribe. This patient was seen in room APA10/APA10 and the patient's care was started at 1:06 PM.     Chief Complaint  Patient presents with  . Weakness     Patient is a 48 y.o. male presenting with weakness. The history is provided by the patient. No language interpreter was used.  Weakness This is a new problem. The current episode started more than 1 week ago. The problem occurs constantly. The problem has not changed since onset.Pertinent negatives include no chest pain and no abdominal pain. Nothing aggravates the symptoms.   HPI Comments: Daryn Hicks Kilroy is a 48 y.o. male with a hx of cirrhosis and a cholecystectomy who presents to the Emergency Department complaining of gradual onset generalized weakness that began 2-3 weeks ago. Patient was sent here by Dr. Legrand Rams because his BP was low. He has also been experiencing weakness and increased thirst for the past 2-3 weeks. Patient has not experienced syncope, but has felt near-syncope. Patient also notes having bilateral knee pain. He denies abdominal pain. He also has a hx of smoking.   Past Medical History  Diagnosis Date  . COPD (chronic obstructive pulmonary disease)   . Cirrhosis   . Hypertension    Past Surgical History  Procedure Laterality Date  . Cholecystectomy     History reviewed. No pertinent family history. History  Substance Use Topics  . Smoking status: Current Every Day Smoker -- 0.50 packs/day    Types: Cigarettes  . Smokeless tobacco: Not on file  . Alcohol Use: No    Review of Systems  Constitutional: Negative for appetite change.  HENT: Negative for congestion, ear discharge and sinus pressure.   Eyes: Negative for discharge.  Respiratory: Negative for cough.   Cardiovascular: Negative for chest pain.  Gastrointestinal: Negative for abdominal pain and diarrhea.  Endocrine:  Positive for polydipsia.  Genitourinary: Negative for frequency and hematuria.  Musculoskeletal: Positive for arthralgias.  Skin: Negative for rash.  Neurological: Positive for dizziness and weakness. Negative for syncope.  Psychiatric/Behavioral: Negative for hallucinations.      Allergies  Penicillins  Home Medications   Prior to Admission medications   Medication Sig Start Date End Date Taking? Authorizing Provider  ARIPiprazole (ABILIFY) 30 MG tablet Take 30 mg by mouth at bedtime.  12/03/13   Historical Provider, MD  ciprofloxacin (CIPRO) 750 MG tablet Take 750 mg by mouth once a week.    Historical Provider, MD  citalopram (CELEXA) 40 MG tablet Take 40 mg by mouth at bedtime.  12/03/13   Historical Provider, MD  furosemide (LASIX) 40 MG tablet Take 40 mg by mouth daily. 12/03/13   Historical Provider, MD  gabapentin (NEURONTIN) 300 MG capsule Take 300 mg by mouth 3 (three) times daily. 12/03/13   Historical Provider, MD  lactulose (CHRONULAC) 10 GM/15ML solution Take 20 g by mouth 2 (two) times daily.    Historical Provider, MD  nadolol (CORGARD) 20 MG tablet Take 20 mg by mouth daily. 12/03/13   Historical Provider, MD  ondansetron (ZOFRAN-ODT) 4 MG disintegrating tablet Take 4 mg by mouth every 8 (eight) hours as needed for nausea or vomiting.    Historical Provider, MD  oxyCODONE (OXY IR/ROXICODONE) 5 MG immediate release tablet Take 5 mg by mouth every 4 (four) hours as needed for severe pain.    Historical Provider,  MD  pantoprazole (PROTONIX) 40 MG tablet Take 40 mg by mouth daily. 12/03/13   Historical Provider, MD  predniSONE (DELTASONE) 10 MG tablet Take 10 mg by mouth daily. 12/03/13   Historical Provider, MD  predniSONE (DELTASONE) 20 MG tablet Take 3 tablets (60 mg total) by mouth daily. 01/08/14   Kristen N Ward, DO  PROAIR HFA 108 (90 BASE) MCG/ACT inhaler Inhale 2 puffs into the lungs every 4 (four) hours as needed. Shortness of breath/wheeze 12/05/13   Historical Provider,  MD  SPIRIVA HANDIHALER 18 MCG inhalation capsule Place 1 capsule into inhaler and inhale daily. 12/05/13   Historical Provider, MD  spironolactone (ALDACTONE) 25 MG tablet Take 25 mg by mouth 2 (two) times daily. 12/03/13   Historical Provider, MD  SYMBICORT 160-4.5 MCG/ACT inhaler Inhale 1 puff into the lungs 2 (two) times daily. 12/08/13   Historical Provider, MD  traZODone (DESYREL) 150 MG tablet Take 150 mg by mouth at bedtime. 12/03/13   Historical Provider, MD  vitamin B-12 (CYANOCOBALAMIN) 500 MCG tablet Take 500 mcg by mouth daily.    Historical Provider, MD  XIFAXAN 550 MG TABS tablet Take 550 mg by mouth 2 (two) times daily. 12/03/13   Historical Provider, MD   BP 132/49 mmHg  Pulse 54  Temp(Src) 98.5 F (36.9 C) (Oral)  Resp 12  Ht 6\' 3"  (1.905 m)  Wt 305 lb (138.347 kg)  BMI 38.12 kg/m2  SpO2 96% Physical Exam  Constitutional: He is oriented to person, place, and time. He appears well-developed.  HENT:  Head: Normocephalic.  Mouth/Throat: Mucous membranes are dry.  Eyes: Conjunctivae and EOM are normal. No scleral icterus.  Neck: Neck supple. No thyromegaly present.  Cardiovascular: Normal rate and regular rhythm.  Exam reveals no gallop and no friction rub.   No murmur heard. Pulmonary/Chest: No stridor. He has wheezes. He has no rales. He exhibits no tenderness.  Mild wheezes bilaterally.  Abdominal: He exhibits no distension. There is no tenderness. There is no rebound.  Musculoskeletal: Normal range of motion. He exhibits no edema.  Lymphadenopathy:    He has no cervical adenopathy.  Neurological: He is oriented to person, place, and time. He exhibits normal muscle tone. Coordination normal.  Skin: No rash noted. No erythema.  Psychiatric: He has a normal mood and affect. His behavior is normal.  Nursing note and vitals reviewed.   ED Course  Procedures  DIAGNOSTIC STUDIES: Oxygen Saturation is 96% on RA, adequate by my interpretation.    COORDINATION OF  CARE: 1:08 PM-Discussed treatment plan which includes labs with pt at bedside and pt agreed to plan.   Labs Review Labs Reviewed  CBG MONITORING, ED - Abnormal; Notable for the following:    Glucose-Capillary 114 (*)    All other components within normal limits  CBC  BASIC METABOLIC PANEL  TROPONIN I    Imaging Review No results found.   EKG Interpretation None      MDM   Final diagnoses:  None    Dehydration,  Improved with ns   The chart was scribed for me under my direct supervision.  I personally performed the history, physical, and medical decision making and all procedures in the evaluation of this patient.Jorge Diego, MD 03/18/14 [phone]

## 2014-03-18 NOTE — ED Notes (Signed)
Pt c/o generalized weakness for 2 weeks, c/o chronic knee pain bilaterally , denies SOB or CP

## 2014-04-01 ENCOUNTER — Ambulatory Visit: Payer: Self-pay | Admitting: Specialist

## 2014-04-01 DIAGNOSIS — K745 Biliary cirrhosis, unspecified: Secondary | ICD-10-CM | POA: Diagnosis not present

## 2014-04-01 DIAGNOSIS — Z720 Tobacco use: Secondary | ICD-10-CM | POA: Diagnosis not present

## 2014-04-01 DIAGNOSIS — R911 Solitary pulmonary nodule: Secondary | ICD-10-CM | POA: Diagnosis not present

## 2014-04-01 DIAGNOSIS — J449 Chronic obstructive pulmonary disease, unspecified: Secondary | ICD-10-CM | POA: Diagnosis not present

## 2014-04-01 DIAGNOSIS — R918 Other nonspecific abnormal finding of lung field: Secondary | ICD-10-CM | POA: Diagnosis not present

## 2014-04-01 DIAGNOSIS — M47894 Other spondylosis, thoracic region: Secondary | ICD-10-CM | POA: Diagnosis not present

## 2014-04-01 DIAGNOSIS — M898X8 Other specified disorders of bone, other site: Secondary | ICD-10-CM | POA: Diagnosis not present

## 2014-04-03 DIAGNOSIS — K7030 Alcoholic cirrhosis of liver without ascites: Secondary | ICD-10-CM | POA: Diagnosis not present

## 2014-04-03 DIAGNOSIS — E119 Type 2 diabetes mellitus without complications: Secondary | ICD-10-CM | POA: Diagnosis not present

## 2014-04-03 DIAGNOSIS — J449 Chronic obstructive pulmonary disease, unspecified: Secondary | ICD-10-CM | POA: Diagnosis not present

## 2014-04-03 DIAGNOSIS — I951 Orthostatic hypotension: Secondary | ICD-10-CM | POA: Diagnosis not present

## 2014-04-14 DIAGNOSIS — R918 Other nonspecific abnormal finding of lung field: Secondary | ICD-10-CM | POA: Diagnosis not present

## 2014-04-14 DIAGNOSIS — J449 Chronic obstructive pulmonary disease, unspecified: Secondary | ICD-10-CM | POA: Diagnosis not present

## 2014-04-14 DIAGNOSIS — K7030 Alcoholic cirrhosis of liver without ascites: Secondary | ICD-10-CM | POA: Diagnosis not present

## 2014-04-14 DIAGNOSIS — E119 Type 2 diabetes mellitus without complications: Secondary | ICD-10-CM | POA: Diagnosis not present

## 2014-04-14 DIAGNOSIS — G4733 Obstructive sleep apnea (adult) (pediatric): Secondary | ICD-10-CM | POA: Diagnosis not present

## 2014-04-14 DIAGNOSIS — F17210 Nicotine dependence, cigarettes, uncomplicated: Secondary | ICD-10-CM | POA: Diagnosis not present

## 2014-04-14 DIAGNOSIS — I951 Orthostatic hypotension: Secondary | ICD-10-CM | POA: Diagnosis not present

## 2014-04-14 DIAGNOSIS — E663 Overweight: Secondary | ICD-10-CM | POA: Diagnosis not present

## 2014-05-22 DIAGNOSIS — F331 Major depressive disorder, recurrent, moderate: Secondary | ICD-10-CM | POA: Diagnosis not present

## 2014-06-02 DIAGNOSIS — K769 Liver disease, unspecified: Secondary | ICD-10-CM | POA: Diagnosis not present

## 2014-06-02 DIAGNOSIS — J449 Chronic obstructive pulmonary disease, unspecified: Secondary | ICD-10-CM | POA: Diagnosis not present

## 2014-06-02 DIAGNOSIS — K7030 Alcoholic cirrhosis of liver without ascites: Secondary | ICD-10-CM | POA: Diagnosis not present

## 2014-06-02 DIAGNOSIS — I951 Orthostatic hypotension: Secondary | ICD-10-CM | POA: Diagnosis not present

## 2014-06-26 DIAGNOSIS — K759 Inflammatory liver disease, unspecified: Secondary | ICD-10-CM | POA: Diagnosis not present

## 2014-06-26 DIAGNOSIS — E119 Type 2 diabetes mellitus without complications: Secondary | ICD-10-CM | POA: Diagnosis not present

## 2014-06-26 DIAGNOSIS — J449 Chronic obstructive pulmonary disease, unspecified: Secondary | ICD-10-CM | POA: Diagnosis not present

## 2014-06-26 DIAGNOSIS — F329 Major depressive disorder, single episode, unspecified: Secondary | ICD-10-CM | POA: Diagnosis not present

## 2014-06-26 DIAGNOSIS — K769 Liver disease, unspecified: Secondary | ICD-10-CM | POA: Diagnosis not present

## 2014-06-26 DIAGNOSIS — I10 Essential (primary) hypertension: Secondary | ICD-10-CM | POA: Diagnosis not present

## 2014-06-26 DIAGNOSIS — K7460 Unspecified cirrhosis of liver: Secondary | ICD-10-CM | POA: Diagnosis not present

## 2014-07-09 ENCOUNTER — Emergency Department (HOSPITAL_COMMUNITY)
Admission: EM | Admit: 2014-07-09 | Discharge: 2014-07-09 | Disposition: A | Discharge status: Home / Self Care | Payer: Medicare Other | Attending: Emergency Medicine | Admitting: Emergency Medicine

## 2014-07-09 ENCOUNTER — Encounter (HOSPITAL_COMMUNITY): Payer: Self-pay | Admitting: Emergency Medicine

## 2014-07-09 ENCOUNTER — Emergency Department (HOSPITAL_COMMUNITY): Payer: Medicare Other

## 2014-07-09 DIAGNOSIS — Z8719 Personal history of other diseases of the digestive system: Secondary | ICD-10-CM | POA: Insufficient documentation

## 2014-07-09 DIAGNOSIS — M79672 Pain in left foot: Secondary | ICD-10-CM | POA: Insufficient documentation

## 2014-07-09 DIAGNOSIS — J449 Chronic obstructive pulmonary disease, unspecified: Secondary | ICD-10-CM | POA: Diagnosis not present

## 2014-07-09 DIAGNOSIS — Z720 Tobacco use: Secondary | ICD-10-CM | POA: Insufficient documentation

## 2014-07-09 DIAGNOSIS — Z880 Allergy status to penicillin: Secondary | ICD-10-CM | POA: Diagnosis not present

## 2014-07-09 DIAGNOSIS — I10 Essential (primary) hypertension: Secondary | ICD-10-CM | POA: Diagnosis not present

## 2014-07-09 DIAGNOSIS — G8929 Other chronic pain: Secondary | ICD-10-CM | POA: Diagnosis not present

## 2014-07-09 DIAGNOSIS — K7030 Alcoholic cirrhosis of liver without ascites: Secondary | ICD-10-CM | POA: Diagnosis not present

## 2014-07-09 DIAGNOSIS — M19072 Primary osteoarthritis, left ankle and foot: Secondary | ICD-10-CM | POA: Diagnosis not present

## 2014-07-09 DIAGNOSIS — Z79899 Other long term (current) drug therapy: Secondary | ICD-10-CM | POA: Insufficient documentation

## 2014-07-09 DIAGNOSIS — R109 Unspecified abdominal pain: Secondary | ICD-10-CM | POA: Diagnosis not present

## 2014-07-09 MED ORDER — PREDNISONE 10 MG PO TABS: ORAL_TABLET | ORAL | Status: DC

## 2014-07-09 MED ORDER — OXYCODONE HCL 5 MG PO TABS
10.0000 mg | ORAL_TABLET | Freq: Once | ORAL | Status: AC
Start: 2014-07-09 — End: 2014-07-09
  Administered 2014-07-09: 10 mg via ORAL
  Filled 2014-07-09: qty 2

## 2014-07-09 MED ORDER — PREDNISONE 50 MG PO TABS
60.0000 mg | ORAL_TABLET | Freq: Once | ORAL | Status: AC
  Administered 2014-07-09: 60 mg via ORAL
  Filled 2014-07-09 (×2): qty 1

## 2014-07-09 NOTE — ED Notes (Signed)
Left foot injury one week, pt has been taking percocet 5mg   Without relief

## 2014-07-09 NOTE — ED Provider Notes (Signed)
CSN: [id number]     Arrival date & time 07/09/14  1933 History   First MD Initiated Contact with Patient 07/09/14 1945     Chief Complaint  Patient presents with  . Foot Pain     (Consider location/radiation/quality/duration/timing/severity/associated sxs/prior Treatment) The history is provided by the patient.   Jorge [name] is a 49 y.o. male with a history of COPD and cirrhosis presenting with a one week history of left mid foot pain and redness.  He denies any obvious injury to the foot but is concerned about a possible stress fracture.  Pain is constant and worse with weight bearing.  He takes oxycodone 5 mg tablets prescribed by his GI physician for his chronic abdominal pain which he uses sparingly, but did  not relieve his foot pain when he took it yesterday.  He denies radiation of pain.  Rest and non weight bearing improves his symptoms.    Past Medical History  Diagnosis Date  . COPD (chronic obstructive pulmonary disease)   . Cirrhosis   . Hypertension    Past Surgical History  Procedure Laterality Date  . Cholecystectomy     No family history on file. History  Substance Use Topics  . Smoking status: Current Every Day Smoker -- 0.50 packs/day    Types: Cigarettes  . Smokeless tobacco: Not on file  . Alcohol Use: No    Review of Systems  Constitutional: Negative for fever.  Musculoskeletal: Positive for arthralgias. Negative for myalgias and joint swelling.  Skin: Positive for color change.  Neurological: Negative for weakness and numbness.      Allergies  Penicillins  Home Medications   Prior to Admission medications   Medication Sig Start Date End Date Taking? Authorizing Provider  ARIPiprazole (ABILIFY) 30 MG tablet Take 30 mg by mouth at bedtime.  12/03/13  Yes Historical Provider, MD  benztropine (COGENTIN) 1 MG tablet Take 1 mg by mouth 2 (two) times daily.   Yes Historical Provider, MD  ciprofloxacin (CIPRO) 750 MG tablet Take 750 mg by mouth  once a week.   Yes Historical Provider, MD  citalopram (CELEXA) 40 MG tablet Take 40 mg by mouth at bedtime.  12/03/13  Yes Historical Provider, MD  furosemide (LASIX) 40 MG tablet Take 40 mg by mouth daily. 12/03/13  Yes Historical Provider, MD  gabapentin (NEURONTIN) 300 MG capsule Take 300 mg by mouth 3 (three) times daily. 12/03/13  Yes Historical Provider, MD  nadolol (CORGARD) 20 MG tablet Take 20 mg by mouth daily. 12/03/13  Yes Historical Provider, MD  oxyCODONE (OXY IR/ROXICODONE) 5 MG immediate release tablet Take 5 mg by mouth every 6 (six) hours as needed for severe pain.    Yes Historical Provider, MD  pantoprazole (PROTONIX) 40 MG tablet Take 40 mg by mouth daily. 12/03/13  Yes Historical Provider, MD  PROAIR HFA 108 (90 BASE) MCG/ACT inhaler Inhale 2 puffs into the lungs every 4 (four) hours as needed. Shortness of breath/wheeze 12/05/13  Yes Historical Provider, MD  SPIRIVA HANDIHALER 18 MCG inhalation capsule Place 1 capsule into inhaler and inhale daily. 12/05/13  Yes Historical Provider, MD  spironolactone (ALDACTONE) 25 MG tablet Take 25 mg by mouth 2 (two) times daily. 12/03/13  Yes Historical Provider, MD  SYMBICORT 160-4.5 MCG/ACT inhaler Inhale 1 puff into the lungs 2 (two) times daily. 12/08/13  Yes Historical Provider, MD  traZODone (DESYREL) 150 MG tablet Take 300 mg by mouth at bedtime.  12/03/13  Yes Historical Provider, MD  vitamin B-12 (CYANOCOBALAMIN) 500 MCG tablet Take 500 mcg by mouth daily.   Yes Historical Provider, MD  XIFAXAN 550 MG TABS tablet Take 550 mg by mouth 2 (two) times daily. 12/03/13  Yes Historical Provider, MD  lactulose (CHRONULAC) 10 GM/15ML solution Take 20 g by mouth 2 (two) times daily.    Historical Provider, MD  predniSONE (DELTASONE) 10 MG tablet 6, 5, 4, 3, 2 then 1 tablet by mouth daily for 6 days total. 07/09/14   Evalee Jefferson, PA-C   BP 133/71 mmHg  Pulse 73  Temp(Src) 98.1 F (36.7 C) (Oral)  Resp 20  Ht 6\' 3"  (1.905 m)  Wt 321 lb (145.605 kg)   BMI 40.12 kg/m2  SpO2 94% Physical Exam  Constitutional: He appears well-developed and well-nourished.  HENT:  Head: Atraumatic.  Neck: Normal range of motion.  Cardiovascular:  Pulses equal bilaterally  Musculoskeletal: He exhibits tenderness. He exhibits no edema.       Left foot: There is bony tenderness. There is no swelling, normal capillary refill, no crepitus and no deformity.       Feet:  Neurological: He is alert. He has normal strength. He displays normal reflexes. No sensory deficit.  Skin: Skin is warm and dry.  Psychiatric: He has a normal mood and affect.    ED Course  Procedures (including critical care time) Labs Review Labs Reviewed - No data to display  Imaging Review Dg Foot Complete Left  07/09/2014   CLINICAL DATA:  Left foot pain across the metatarsals since February 15th. No injury.  EXAM: LEFT FOOT - COMPLETE 3+ VIEW  COMPARISON:  None.  FINDINGS: Mild degenerative changes in the first metatarsal-phalangeal, interphalangeal, and intertarsal joints. Plantar calcaneal spurs. No evidence of acute fracture or dislocation. No focal bone lesion or bone destruction. Soft tissues are unremarkable appear  IMPRESSION: Degenerative changes.  No acute bony abnormalities.   Electronically Signed   By: Lucienne Capers M.D.   On: 07/09/2014 21:13     EKG Interpretation None      MDM   Final diagnoses:  Foot pain, left    Patients labs and/or radiological studies were viewed and considered during the medical decision making and disposition process. Foot pain of unclear etiology.  Localized pain, new onset, possible chronic arthritis now causing sx.  No edema or erythema to suggest gout.  No stress fx.  Pt was placed on prednisone as he cannot tolerate nsaids and tylenol.  Advised heat tx.  He can take his home oxycodone as needed.  Advised f/u by pcp if sx persist with tx.    Evalee Jefferson, PA-C 07/10/14 0150  Virgel Manifold, MD 07/15/14 [phone]

## 2014-07-09 NOTE — Discharge Instructions (Signed)
Musculoskeletal Pain Musculoskeletal pain is muscle and boney aches and pains. These pains can occur in any part of the body. Your caregiver may treat you without knowing the cause of the pain. They may treat you if blood or urine tests, X-rays, and other tests were normal.  CAUSES There is often not a definite cause or reason for these pains. These pains may be caused by a type of germ (virus). The discomfort may also come from overuse. Overuse includes working out too hard when your body is not fit. Boney aches also come from weather changes. Bone is sensitive to atmospheric pressure changes. HOME CARE INSTRUCTIONS   Ask when your test results will be ready. Make sure you get your test results.  Only take over-the-counter or prescription medicines for pain, discomfort, or fever as directed by your caregiver. If you were given medications for your condition, do not drive, operate machinery or power tools, or sign legal documents for 24 hours. Do not drink alcohol. Do not take sleeping pills or other medications that may interfere with treatment.  Continue all activities unless the activities cause more pain. When the pain lessens, slowly resume normal activities. Gradually increase the intensity and duration of the activities or exercise.  During periods of severe pain, bed rest may be helpful. Lay or sit in any position that is comfortable.  Putting ice on the injured area.  Put ice in a bag.  Place a towel between your skin and the bag.  Leave the ice on for 15 to 20 minutes, 3 to 4 times a day.  Follow up with your caregiver for continued problems and no reason can be found for the pain. If the pain becomes worse or does not go away, it may be necessary to repeat tests or do additional testing. Your caregiver may need to look further for a possible cause. SEEK IMMEDIATE MEDICAL CARE IF:  You have pain that is getting worse and is not relieved by medications.  You develop chest pain  that is associated with shortness or breath, sweating, feeling sick to your stomach (nauseous), or throw up (vomit).  Your pain becomes localized to the abdomen.  You develop any new symptoms that seem different or that concern you. MAKE SURE YOU:   Understand these instructions.  Will watch your condition.  Will get help right away if you are not doing well or get worse. Document Released: 05/01/2005 Document Revised: 07/24/2011 Document Reviewed: 01/03/2013 [employer] Patient Information 2015 [place], [place]. This information is not intended to replace advice given to you by your health care provider. Make sure you discuss any questions you have with your health care provider.   Your xrays are negative tonight for any obvious source of your pain.  Take your next dose of prednisone tomorrow evening.  Continue using your oxycodone as needed. Elevation and a heating pad may offer some relief as well.

## 2014-07-14 DIAGNOSIS — N183 Chronic kidney disease, stage 3 (moderate): Secondary | ICD-10-CM | POA: Diagnosis not present

## 2014-07-15 ENCOUNTER — Ambulatory Visit: Payer: Self-pay | Admitting: Gastroenterology

## 2014-07-15 DIAGNOSIS — K7460 Unspecified cirrhosis of liver: Secondary | ICD-10-CM | POA: Diagnosis not present

## 2014-07-15 DIAGNOSIS — K7469 Other cirrhosis of liver: Secondary | ICD-10-CM | POA: Diagnosis not present

## 2014-07-15 DIAGNOSIS — R161 Splenomegaly, not elsewhere classified: Secondary | ICD-10-CM | POA: Diagnosis not present

## 2014-07-20 DIAGNOSIS — N183 Chronic kidney disease, stage 3 (moderate): Secondary | ICD-10-CM | POA: Diagnosis not present

## 2014-07-20 DIAGNOSIS — E119 Type 2 diabetes mellitus without complications: Secondary | ICD-10-CM | POA: Diagnosis not present

## 2014-07-20 DIAGNOSIS — K7031 Alcoholic cirrhosis of liver with ascites: Secondary | ICD-10-CM | POA: Diagnosis not present

## 2014-08-06 DIAGNOSIS — K769 Liver disease, unspecified: Secondary | ICD-10-CM | POA: Diagnosis not present

## 2014-08-06 DIAGNOSIS — J449 Chronic obstructive pulmonary disease, unspecified: Secondary | ICD-10-CM | POA: Diagnosis not present

## 2014-08-06 DIAGNOSIS — K7030 Alcoholic cirrhosis of liver without ascites: Secondary | ICD-10-CM | POA: Diagnosis not present

## 2014-08-06 DIAGNOSIS — I951 Orthostatic hypotension: Secondary | ICD-10-CM | POA: Diagnosis not present

## 2014-08-29 ENCOUNTER — Emergency Department (HOSPITAL_COMMUNITY): Payer: Medicare Other

## 2014-08-29 ENCOUNTER — Encounter (HOSPITAL_COMMUNITY): Payer: Self-pay | Admitting: *Deleted

## 2014-08-29 ENCOUNTER — Inpatient Hospital Stay (HOSPITAL_COMMUNITY)
Admission: EM | Admit: 2014-08-29 | Discharge: 2014-09-02 | DRG: 190 | Disposition: A | Discharge status: Nursing Home (Includes ICF) | Payer: Medicare Other | Attending: Internal Medicine | Admitting: Internal Medicine

## 2014-08-29 DIAGNOSIS — F17210 Nicotine dependence, cigarettes, uncomplicated: Secondary | ICD-10-CM | POA: Diagnosis present

## 2014-08-29 DIAGNOSIS — N183 Chronic kidney disease, stage 3 unspecified: Secondary | ICD-10-CM | POA: Diagnosis present

## 2014-08-29 DIAGNOSIS — R079 Chest pain, unspecified: Secondary | ICD-10-CM | POA: Diagnosis not present

## 2014-08-29 DIAGNOSIS — J449 Chronic obstructive pulmonary disease, unspecified: Secondary | ICD-10-CM | POA: Diagnosis present

## 2014-08-29 DIAGNOSIS — I129 Hypertensive chronic kidney disease with stage 1 through stage 4 chronic kidney disease, or unspecified chronic kidney disease: Secondary | ICD-10-CM | POA: Diagnosis present

## 2014-08-29 DIAGNOSIS — I10 Essential (primary) hypertension: Secondary | ICD-10-CM | POA: Diagnosis present

## 2014-08-29 DIAGNOSIS — Z7952 Long term (current) use of systemic steroids: Secondary | ICD-10-CM

## 2014-08-29 DIAGNOSIS — N189 Chronic kidney disease, unspecified: Secondary | ICD-10-CM | POA: Diagnosis present

## 2014-08-29 DIAGNOSIS — R0902 Hypoxemia: Secondary | ICD-10-CM | POA: Diagnosis not present

## 2014-08-29 DIAGNOSIS — J9601 Acute respiratory failure with hypoxia: Secondary | ICD-10-CM | POA: Diagnosis present

## 2014-08-29 DIAGNOSIS — J441 Chronic obstructive pulmonary disease with (acute) exacerbation: Secondary | ICD-10-CM | POA: Diagnosis not present

## 2014-08-29 DIAGNOSIS — K7460 Unspecified cirrhosis of liver: Secondary | ICD-10-CM | POA: Diagnosis present

## 2014-08-29 DIAGNOSIS — G4733 Obstructive sleep apnea (adult) (pediatric): Secondary | ICD-10-CM | POA: Diagnosis present

## 2014-08-29 DIAGNOSIS — R0602 Shortness of breath: Secondary | ICD-10-CM | POA: Diagnosis present

## 2014-08-29 DIAGNOSIS — K7469 Other cirrhosis of liver: Secondary | ICD-10-CM

## 2014-08-29 DIAGNOSIS — Z79891 Long term (current) use of opiate analgesic: Secondary | ICD-10-CM | POA: Diagnosis not present

## 2014-08-29 DIAGNOSIS — Z23 Encounter for immunization: Secondary | ICD-10-CM

## 2014-08-29 DIAGNOSIS — Z8249 Family history of ischemic heart disease and other diseases of the circulatory system: Secondary | ICD-10-CM | POA: Diagnosis not present

## 2014-08-29 DIAGNOSIS — J9691 Respiratory failure, unspecified with hypoxia: Secondary | ICD-10-CM | POA: Diagnosis not present

## 2014-08-29 DIAGNOSIS — Z7951 Long term (current) use of inhaled steroids: Secondary | ICD-10-CM

## 2014-08-29 LAB — CBC WITH DIFFERENTIAL/PLATELET
BASOS PCT: 0 % (ref 0–1)
Basophils Absolute: 0 10*3/uL (ref 0.0–0.1)
EOS PCT: 1 % (ref 0–5)
Eosinophils Absolute: 0 10*3/uL (ref 0.0–0.7)
HEMATOCRIT: 42.3 % (ref 39.0–52.0)
Hemoglobin: 14.2 g/dL (ref 13.0–17.0)
LYMPHS ABS: 1.1 10*3/uL (ref 0.7–4.0)
Lymphocytes Relative: 17 % (ref 12–46)
MCH: 34.7 pg — ABNORMAL HIGH (ref 26.0–34.0)
MCHC: 33.6 g/dL (ref 30.0–36.0)
MCV: 103.4 fL — ABNORMAL HIGH (ref 78.0–100.0)
Monocytes Absolute: 0.3 10*3/uL (ref 0.1–1.0)
Monocytes Relative: 5 % (ref 3–12)
Neutro Abs: 5.2 10*3/uL (ref 1.7–7.7)
Neutrophils Relative %: 77 % (ref 43–77)
Platelets: 85 10*3/uL — ABNORMAL LOW (ref 150–400)
RBC: 4.09 MIL/uL — ABNORMAL LOW (ref 4.22–5.81)
RDW: 14.7 % (ref 11.5–15.5)
WBC: 6.7 10*3/uL (ref 4.0–10.5)

## 2014-08-29 LAB — COMPREHENSIVE METABOLIC PANEL
ALK PHOS: 65 U/L (ref 39–117)
ALT: 21 U/L (ref 0–53)
AST: 18 U/L (ref 0–37)
Albumin: 3.7 g/dL (ref 3.5–5.2)
Anion gap: 6 (ref 5–15)
BILIRUBIN TOTAL: 0.6 mg/dL (ref 0.3–1.2)
BUN: 41 mg/dL — ABNORMAL HIGH (ref 6–23)
CALCIUM: 8.3 mg/dL — AB (ref 8.4–10.5)
CHLORIDE: 106 mmol/L (ref 96–112)
CO2: 28 mmol/L (ref 19–32)
Creatinine, Ser: 1.9 mg/dL — ABNORMAL HIGH (ref 0.50–1.35)
GFR calc Af Amer: 46 mL/min — ABNORMAL LOW (ref >90)
GFR calc non Af Amer: 40 mL/min — ABNORMAL LOW (ref >90)
GLUCOSE: 102 mg/dL — AB (ref 70–99)
Potassium: 4.3 mmol/L (ref 3.5–5.1)
SODIUM: 140 mmol/L (ref 135–145)
TOTAL PROTEIN: 6.9 g/dL (ref 6.0–8.3)

## 2014-08-29 LAB — TROPONIN I: Troponin I: <0.03 ng/mL (ref <0.031)

## 2014-08-29 MED ORDER — IPRATROPIUM BROMIDE 0.02 % IN SOLN
RESPIRATORY_TRACT | Status: AC
  Administered 2014-08-29: 0.5 mg
  Filled 2014-08-29: qty 2.5

## 2014-08-29 MED ORDER — METHYLPREDNISOLONE SODIUM SUCC 125 MG IJ SOLR
125.0000 mg | Freq: Once | INTRAMUSCULAR | Status: AC
  Administered 2014-08-30: 125 mg via INTRAVENOUS
  Filled 2014-08-29: qty 2

## 2014-08-29 MED ORDER — ALBUTEROL (5 MG/ML) CONTINUOUS INHALATION SOLN
INHALATION_SOLUTION | RESPIRATORY_TRACT | Status: AC
  Administered 2014-08-29: 21:00:00
  Filled 2014-08-29: qty 20

## 2014-08-29 MED ORDER — IPRATROPIUM-ALBUTEROL 0.5-2.5 (3) MG/3ML IN SOLN
3.0000 mL | Freq: Once | RESPIRATORY_TRACT | Status: AC
  Administered 2014-08-29: 3 mL via RESPIRATORY_TRACT
  Filled 2014-08-29: qty 3

## 2014-08-29 MED ORDER — LEVOFLOXACIN IN D5W 500 MG/100ML IV SOLN
500.0000 mg | Freq: Once | INTRAVENOUS | Status: DC
  Administered 2014-08-30: 500 mg via INTRAVENOUS
  Filled 2014-08-29: qty 100

## 2014-08-29 MED ORDER — ALBUTEROL (5 MG/ML) CONTINUOUS INHALATION SOLN: 10.0000 mg/h | INHALATION_SOLUTION | Freq: Once | RESPIRATORY_TRACT | Status: DC

## 2014-08-29 NOTE — ED Provider Notes (Signed)
CSN: [id number]     Arrival date & time 08/29/14  2044 History  This chart was scribed for Tanna Furry, MD by Erling Conte, ED Scribe. This patient was seen in room APA11/[facility]11 and the patient's care was started at 9:02 PM.    Chief Complaint  Patient presents with  . Chest Pain    The history is provided by the patient and the EMS personnel. No language interpreter was used.    HPI Comments: Jorge [name] is a 49 y.o. male with a h/o COPD and HTN brought in by ambulance  who presents to the Emergency Department complaining of constant, moderate, chest pain that began 2 hours ago. Pt states he lives in an assisted living home due to previous suicide attempts. He states the pain began while sitting in his home. He characterized the pain as feeling as if someone was sitting on his chest. Pt states at the time he was having associated SOB. He notes the chest pain and SOB have now resolved. Pt is a current everyday smoker. He states he was given 4 ASA and 2 nitroglycerins in the ambulance which provided relief. He notes the pain started to resolve after the 1st nitro and then completely went away with the 2nd. He also notes some mild lower back pain. Pt states his father died from heart disease at age 24. He denies any h/o DM or a-fib. He denies any previous episodes of chest pain. He denies any leg swelling.  Patient initially states he is not short of breath. However upon exam, he is markedly bronchospastic.     Past Medical History  Diagnosis Date  . COPD (chronic obstructive pulmonary disease)   . Cirrhosis   . Hypertension    Past Surgical History  Procedure Laterality Date  . Cholecystectomy     No family history on file. History  Substance Use Topics  . Smoking status: Current Every Day Smoker -- 0.50 packs/day    Types: Cigarettes  . Smokeless tobacco: Not on file  . Alcohol Use: No    Review of Systems  Constitutional: Negative for fever, chills, diaphoresis, appetite  change and fatigue.  HENT: Negative for mouth sores, sore throat and trouble swallowing.   Eyes: Negative for visual disturbance.  Respiratory: Positive for shortness of breath. Negative for cough, chest tightness and wheezing.   Cardiovascular: Positive for chest pain. Negative for leg swelling.  Gastrointestinal: Negative for nausea, vomiting, abdominal pain, diarrhea and abdominal distention.  Endocrine: Negative for polydipsia, polyphagia and polyuria.  Genitourinary: Negative for dysuria, frequency and hematuria.  Musculoskeletal: Negative for gait problem.  Skin: Negative for color change, pallor and rash.  Neurological: Negative for dizziness, syncope, light-headedness and headaches.  Hematological: Does not bruise/bleed easily.  Psychiatric/Behavioral: Negative for behavioral problems and confusion.      Allergies  Penicillins  Home Medications   Prior to Admission medications   Medication Sig Start Date End Date Taking? Authorizing Provider  ARIPiprazole (ABILIFY) 30 MG tablet Take 30 mg by mouth at bedtime.  12/03/13  Yes Historical Provider, MD  ciprofloxacin (CIPRO) 750 MG tablet Take 750 mg by mouth every Wednesday.    Yes Historical Provider, MD  citalopram (CELEXA) 40 MG tablet Take 40 mg by mouth at bedtime.  12/03/13  Yes Historical Provider, MD  dextromethorphan (DELSYM) 30 MG/5ML liquid Take 30 mg by mouth every 12 (twelve) hours.   Yes Historical Provider, MD  furosemide (LASIX) 40 MG tablet Take 40 mg by mouth daily.  12/03/13  Yes Historical Provider, MD  gabapentin (NEURONTIN) 300 MG capsule Take 300 mg by mouth 3 (three) times daily. 12/03/13  Yes Historical Provider, MD  metoprolol succinate (TOPROL-XL) 50 MG 24 hr tablet Take 50 mg by mouth daily. Take with or immediately following a meal.   Yes Historical Provider, MD  omeprazole (PRILOSEC) 20 MG capsule Take 20 mg by mouth daily.   Yes Historical Provider, MD  oxyCODONE (OXY IR/ROXICODONE) 5 MG immediate release  tablet Take 5 mg by mouth every 6 (six) hours as needed for severe pain.    Yes Historical Provider, MD  predniSONE (DELTASONE) 10 MG tablet 6, 5, 4, 3, 2 then 1 tablet by mouth daily for 6 days total. Patient taking differently: Take 10 mg by mouth daily.  07/09/14  Yes Evalee Jefferson, PA-C  PROAIR HFA 108 (90 BASE) MCG/ACT inhaler Inhale 2 puffs into the lungs every 4 (four) hours as needed. Shortness of breath/wheeze 12/05/13  Yes Historical Provider, MD  spironolactone (ALDACTONE) 25 MG tablet Take 25 mg by mouth 2 (two) times daily. 12/03/13  Yes Historical Provider, MD  SYMBICORT 160-4.5 MCG/ACT inhaler Inhale 1 puff into the lungs 2 (two) times daily. 12/08/13  Yes Historical Provider, MD  Tiotropium Bromide Monohydrate 2.5 MCG/ACT AERS Inhale 2 puffs into the lungs daily.   Yes Historical Provider, MD  traZODone (DESYREL) 150 MG tablet Take 300 mg by mouth at bedtime.  12/03/13  Yes Historical Provider, MD  vitamin B-12 (CYANOCOBALAMIN) 500 MCG tablet Take 500 mcg by mouth daily.   Yes Historical Provider, MD  XIFAXAN 550 MG TABS tablet Take 550 mg by mouth 2 (two) times daily. 12/03/13  Yes Historical Provider, MD   Triage Vitals: BP 138/65 mmHg  Pulse 68  Temp(Src) 97.6 F (36.4 C) (Oral)  Resp 24  Ht 6\' 3"  (1.905 m)  Wt 325 lb (147.419 kg)  BMI 40.62 kg/m2  SpO2 94%  Physical Exam  Constitutional: He is oriented to person, place, and time. He appears well-developed and well-nourished. No distress.  HENT:  Head: Normocephalic.  Eyes: Conjunctivae are normal. Pupils are equal, round, and reactive to light. No scleral icterus.  Neck: Normal range of motion. Neck supple. No thyromegaly present.  Cardiovascular: Normal rate and regular rhythm.  Exam reveals no gallop and no friction rub.   No murmur heard. Pulmonary/Chest: Effort normal. Tachypnea noted. No respiratory distress. He has wheezes. He has no rales.  Prolonged marked wheezing in all fields. Appears dyspneic   Abdominal: Soft.  Bowel sounds are normal. He exhibits no distension. There is no tenderness. There is no rebound.  Musculoskeletal: Normal range of motion. He exhibits no edema.       Lumbar back: He exhibits tenderness (right ).  Tenderness to right lower back  Neurological: He is alert and oriented to person, place, and time.  Skin: Skin is warm and dry. No rash noted.  Psychiatric: He has a normal mood and affect. His behavior is normal.    ED Course  Procedures (including critical care time)  DIAGNOSTIC STUDIES: Oxygen Saturation is 94% on RA, adequate by my interpretation.    COORDINATION OF CARE: 9:21 PM- Will order breathing treatment, CXR and diagnotic lab work. Pt advised of plan for treatment and pt agrees.    Labs Review Labs Reviewed  CBC WITH DIFFERENTIAL/PLATELET - Abnormal; Notable for the following:    RBC 4.09 (*)    MCV 103.4 (*)    MCH 34.7 (*)  Platelets 85 (*)    All other components within normal limits  COMPREHENSIVE METABOLIC PANEL - Abnormal; Notable for the following:    Glucose, Bld 102 (*)    BUN 41 (*)    Creatinine, Ser 1.90 (*)    Calcium 8.3 (*)    GFR calc non Af Amer 40 (*)    GFR calc Af Amer 46 (*)    All other components within normal limits  TROPONIN I    Imaging Review Dg Chest Portable 1 View  08/29/2014   CLINICAL DATA:  Generalized chest pain today with shortness of breath history of COPD  EXAM: PORTABLE CHEST - 1 VIEW  COMPARISON:  03/18/2014  FINDINGS: Stable mild to moderate cardiac enlargement. Vascular pattern normal. Allowing for overlying soft tissue attenuation no abnormal opacities.  IMPRESSION: No active disease.   Electronically Signed   By: Skipper Cliche M.D.   On: 08/29/2014 21:30     EKG Interpretation   Date/Time:  Saturday August 29 2014 20:55:59 EDT Ventricular Rate:  67 PR Interval:  188 QRS Duration: 98 QT Interval:  414 QTC Calculation: 437 R Axis:   44 Text Interpretation:  Sinus rhythm Anteroseptal infarct, age  indeterminate  Confirmed by Jeneen Rinks  MD, [place] ([id number]) on 08/29/2014 11:15:40 PM Also  confirmed by Jeneen Rinks  MD, [place] ([id number])  on 08/29/2014 11:16:04 PM      MDM   Final diagnoses:  COPD exacerbation  Chest pain, unspecified chest pain type   Patient given the last albuterol 1 hour. Given IV Solu-Medrol.  On recheck after 90 minutes patient states that he continues to feels her shortness of breath. He denies any initial chest pain. His troponin is normal. He is at 1 hours continuous nebulized albuterol and a second albuterol neb. Reexam shows continued inspiratory, and expiratory  wheezing, tachypnea, and slight increased work of breathing. However is not hypoxemic.  Please to call hospitalist regarding admission for further nebulized albuterol steroids and ACS rule out.   I personally performed the services described in this documentation, which was scribed in my presence. The recorded information has been reviewed and is accurate.     Tanna Furry, MD 08/29/14 [phone]

## 2014-08-29 NOTE — ED Notes (Signed)
Pt reports chest pain that started while sitting. Ems gave 4 ASA & 2 nitro. Pain was help but per pt has returned. Pt also complaining of pain in the kidney area of his back.

## 2014-08-30 ENCOUNTER — Encounter (HOSPITAL_COMMUNITY): Payer: Self-pay | Admitting: *Deleted

## 2014-08-30 DIAGNOSIS — R079 Chest pain, unspecified: Secondary | ICD-10-CM

## 2014-08-30 DIAGNOSIS — K7469 Other cirrhosis of liver: Secondary | ICD-10-CM

## 2014-08-30 DIAGNOSIS — J9601 Acute respiratory failure with hypoxia: Secondary | ICD-10-CM

## 2014-08-30 DIAGNOSIS — J441 Chronic obstructive pulmonary disease with (acute) exacerbation: Principal | ICD-10-CM

## 2014-08-30 DIAGNOSIS — I10 Essential (primary) hypertension: Secondary | ICD-10-CM

## 2014-08-30 DIAGNOSIS — N189 Chronic kidney disease, unspecified: Secondary | ICD-10-CM

## 2014-08-30 LAB — BASIC METABOLIC PANEL
ANION GAP: 9 (ref 5–15)
BUN: 41 mg/dL — AB (ref 6–23)
CALCIUM: 8.3 mg/dL — AB (ref 8.4–10.5)
CHLORIDE: 105 mmol/L (ref 96–112)
CO2: 25 mmol/L (ref 19–32)
Creatinine, Ser: 1.71 mg/dL — ABNORMAL HIGH (ref 0.50–1.35)
GFR calc Af Amer: 52 mL/min — ABNORMAL LOW (ref >90)
GFR calc non Af Amer: 45 mL/min — ABNORMAL LOW (ref >90)
GLUCOSE: 199 mg/dL — AB (ref 70–99)
POTASSIUM: 4.2 mmol/L (ref 3.5–5.1)
Sodium: 139 mmol/L (ref 135–145)

## 2014-08-30 LAB — BLOOD GAS, ARTERIAL
Acid-Base Excess: 1.9 mmol/L (ref 0.0–2.0)
BICARBONATE: 26.8 meq/L — AB (ref 20.0–24.0)
Drawn by: 105551
FIO2: 0.28 %
O2 Saturation: 95.3 %
PATIENT TEMPERATURE: 37
PH ART: 7.36 (ref 7.350–7.450)
PO2 ART: 74.6 mmHg — AB (ref 80.0–100.0)
TCO2: 23.6 mmol/L (ref 0–100)
pCO2 arterial: 48.6 mmHg — ABNORMAL HIGH (ref 35.0–45.0)

## 2014-08-30 LAB — CBC
HCT: 42.2 % (ref 39.0–52.0)
Hemoglobin: 14 g/dL (ref 13.0–17.0)
MCH: 34.5 pg — AB (ref 26.0–34.0)
MCHC: 33.2 g/dL (ref 30.0–36.0)
MCV: 103.9 fL — AB (ref 78.0–100.0)
PLATELETS: 85 10*3/uL — AB (ref 150–400)
RBC: 4.06 MIL/uL — AB (ref 4.22–5.81)
RDW: 14.5 % (ref 11.5–15.5)
WBC: 6.2 10*3/uL (ref 4.0–10.5)

## 2014-08-30 LAB — TROPONIN I
Troponin I: <0.03 ng/mL (ref <0.031)
Troponin I: <0.03 ng/mL (ref <0.031)

## 2014-08-30 LAB — MRSA PCR SCREENING: MRSA by PCR: NEGATIVE

## 2014-08-30 MED ORDER — ENOXAPARIN SODIUM 40 MG/0.4ML ~~LOC~~ SOLN
40.0000 mg | SUBCUTANEOUS | Status: DC
  Administered 2014-08-30: 40 mg via SUBCUTANEOUS
  Filled 2014-08-30: qty 0.4

## 2014-08-30 MED ORDER — SODIUM CHLORIDE 0.9 % IJ SOLN: 3.0000 mL | INTRAMUSCULAR | Status: DC | PRN

## 2014-08-30 MED ORDER — METOPROLOL SUCCINATE ER 50 MG PO TB24
50.0000 mg | ORAL_TABLET | Freq: Every day | ORAL | Status: DC
  Administered 2014-08-30 – 2014-09-02 (×4): 50 mg via ORAL
  Filled 2014-08-30 (×4): qty 1

## 2014-08-30 MED ORDER — GABAPENTIN 300 MG PO CAPS
300.0000 mg | ORAL_CAPSULE | Freq: Three times a day (TID) | ORAL | Status: DC
  Administered 2014-08-30 – 2014-09-02 (×10): 300 mg via ORAL
  Filled 2014-08-30 (×10): qty 1

## 2014-08-30 MED ORDER — METHYLPREDNISOLONE SODIUM SUCC 125 MG IJ SOLR
80.0000 mg | Freq: Two times a day (BID) | INTRAMUSCULAR | Status: DC
  Administered 2014-08-30 – 2014-08-31 (×4): 80 mg via INTRAVENOUS
  Filled 2014-08-30 (×4): qty 2

## 2014-08-30 MED ORDER — ENOXAPARIN SODIUM 80 MG/0.8ML ~~LOC~~ SOLN
70.0000 mg | SUBCUTANEOUS | Status: DC
  Administered 2014-08-31 – 2014-09-02 (×3): 70 mg via SUBCUTANEOUS
  Filled 2014-08-30 (×3): qty 0.8

## 2014-08-30 MED ORDER — RIFAXIMIN 550 MG PO TABS
550.0000 mg | ORAL_TABLET | Freq: Two times a day (BID) | ORAL | Status: DC
  Administered 2014-08-30 – 2014-09-02 (×8): 550 mg via ORAL
  Filled 2014-08-30 (×8): qty 1

## 2014-08-30 MED ORDER — BUDESONIDE-FORMOTEROL FUMARATE 160-4.5 MCG/ACT IN AERO
1.0000 | INHALATION_SPRAY | Freq: Two times a day (BID) | RESPIRATORY_TRACT | Status: DC
  Administered 2014-08-30 – 2014-09-02 (×7): 1 via RESPIRATORY_TRACT
  Filled 2014-08-30: qty 6

## 2014-08-30 MED ORDER — CITALOPRAM HYDROBROMIDE 20 MG PO TABS
40.0000 mg | ORAL_TABLET | Freq: Every day | ORAL | Status: DC
  Administered 2014-08-30 – 2014-09-01 (×4): 40 mg via ORAL
  Filled 2014-08-30 (×4): qty 2

## 2014-08-30 MED ORDER — ALBUTEROL SULFATE (2.5 MG/3ML) 0.083% IN NEBU: 2.5000 mg | INHALATION_SOLUTION | RESPIRATORY_TRACT | Status: DC | PRN

## 2014-08-30 MED ORDER — ALBUTEROL SULFATE (2.5 MG/3ML) 0.083% IN NEBU: 2.5000 mg | INHALATION_SOLUTION | Freq: Four times a day (QID) | RESPIRATORY_TRACT | Status: DC

## 2014-08-30 MED ORDER — IPRATROPIUM BROMIDE 0.02 % IN SOLN: 0.5000 mg | Freq: Four times a day (QID) | RESPIRATORY_TRACT | Status: DC

## 2014-08-30 MED ORDER — DEXTROMETHORPHAN POLISTIREX ER 30 MG/5ML PO SUER
30.0000 mg | Freq: Two times a day (BID) | ORAL | Status: DC
  Administered 2014-08-30 – 2014-09-02 (×9): 30 mg via ORAL
  Filled 2014-08-30 (×12): qty 5

## 2014-08-30 MED ORDER — SPIRONOLACTONE 25 MG PO TABS
25.0000 mg | ORAL_TABLET | Freq: Two times a day (BID) | ORAL | Status: DC
  Administered 2014-08-30 – 2014-09-02 (×8): 25 mg via ORAL
  Filled 2014-08-30 (×8): qty 1

## 2014-08-30 MED ORDER — SODIUM CHLORIDE 0.9 % IJ SOLN
3.0000 mL | Freq: Two times a day (BID) | INTRAMUSCULAR | Status: DC
  Administered 2014-08-30 – 2014-09-02 (×8): 3 mL via INTRAVENOUS

## 2014-08-30 MED ORDER — MORPHINE SULFATE 2 MG/ML IJ SOLN: 1.0000 mg | INTRAMUSCULAR | Status: DC | PRN

## 2014-08-30 MED ORDER — FUROSEMIDE 40 MG PO TABS
40.0000 mg | ORAL_TABLET | Freq: Every day | ORAL | Status: DC
  Administered 2014-08-30 – 2014-09-02 (×4): 40 mg via ORAL
  Filled 2014-08-30 (×4): qty 1

## 2014-08-30 MED ORDER — SODIUM CHLORIDE 0.9 % IV SOLN: 250.0000 mL | INTRAVENOUS | Status: DC | PRN

## 2014-08-30 MED ORDER — TRAZODONE HCL 50 MG PO TABS
300.0000 mg | ORAL_TABLET | Freq: Every day | ORAL | Status: DC
  Administered 2014-08-30 – 2014-09-01 (×4): 300 mg via ORAL
  Filled 2014-08-30 (×4): qty 6

## 2014-08-30 MED ORDER — ASPIRIN EC 81 MG PO TBEC
81.0000 mg | DELAYED_RELEASE_TABLET | Freq: Every day | ORAL | Status: DC
  Administered 2014-08-30 – 2014-09-02 (×4): 81 mg via ORAL
  Filled 2014-08-30 (×4): qty 1

## 2014-08-30 MED ORDER — PNEUMOCOCCAL VAC POLYVALENT 25 MCG/0.5ML IJ INJ
0.5000 mL | INJECTION | INTRAMUSCULAR | Status: AC
  Administered 2014-08-31: 0.5 mL via INTRAMUSCULAR
  Filled 2014-08-30: qty 0.5

## 2014-08-30 MED ORDER — IPRATROPIUM-ALBUTEROL 0.5-2.5 (3) MG/3ML IN SOLN
3.0000 mL | Freq: Four times a day (QID) | RESPIRATORY_TRACT | Status: DC
  Administered 2014-08-30 – 2014-09-02 (×15): 3 mL via RESPIRATORY_TRACT
  Filled 2014-08-30 (×15): qty 3

## 2014-08-30 MED ORDER — ARIPIPRAZOLE 10 MG PO TABS
30.0000 mg | ORAL_TABLET | Freq: Every day | ORAL | Status: DC
Start: 2014-08-30 — End: 2014-09-02
  Administered 2014-08-30 – 2014-09-01 (×4): 30 mg via ORAL
  Filled 2014-08-30 (×4): qty 3

## 2014-08-30 MED ORDER — LEVOFLOXACIN 500 MG PO TABS
500.0000 mg | ORAL_TABLET | Freq: Every day | ORAL | Status: DC
  Administered 2014-08-30 – 2014-09-01 (×3): 500 mg via ORAL
  Filled 2014-08-30 (×3): qty 1

## 2014-08-30 MED ORDER — OXYCODONE HCL 5 MG PO TABS: 5.0000 mg | ORAL_TABLET | Freq: Four times a day (QID) | ORAL | Status: DC | PRN

## 2014-08-30 NOTE — H&P (Signed)
PCP:   FANTA,TESFAYE, MD   Chief Complaint:  Sob, cp  HPI: 49 yo male h/o osa cpap dependent, copd, htn comes in with worsening sob and wheezing tonight with associated sscp no radiation.  Was very sob, and couldn't get any air.  No fevers.  No cough.  No le edema or swelling.  Chronically on steroids.  Received several hour long nebs and solumedrol in ED and feels much better.  Ems was called, gave him ntg sl which relieved his pain by arrival to ED.    Review of Systems:  Positive and negative as per HPI otherwise all other systems are negative  Past Medical History: Past Medical History  Diagnosis Date  . COPD (chronic obstructive pulmonary disease)   . Cirrhosis   . Hypertension    Past Surgical History  Procedure Laterality Date  . Cholecystectomy      Medications: Prior to Admission medications   Medication Sig Start Date End Date Taking? Authorizing Provider  ARIPiprazole (ABILIFY) 30 MG tablet Take 30 mg by mouth at bedtime.  12/03/13  Yes Historical Provider, MD  ciprofloxacin (CIPRO) 750 MG tablet Take 750 mg by mouth every Wednesday.    Yes Historical Provider, MD  citalopram (CELEXA) 40 MG tablet Take 40 mg by mouth at bedtime.  12/03/13  Yes Historical Provider, MD  dextromethorphan (DELSYM) 30 MG/5ML liquid Take 30 mg by mouth every 12 (twelve) hours.   Yes Historical Provider, MD  furosemide (LASIX) 40 MG tablet Take 40 mg by mouth daily. 12/03/13  Yes Historical Provider, MD  gabapentin (NEURONTIN) 300 MG capsule Take 300 mg by mouth 3 (three) times daily. 12/03/13  Yes Historical Provider, MD  metoprolol succinate (TOPROL-XL) 50 MG 24 hr tablet Take 50 mg by mouth daily. Take with or immediately following a meal.   Yes Historical Provider, MD  omeprazole (PRILOSEC) 20 MG capsule Take 20 mg by mouth daily.   Yes Historical Provider, MD  oxyCODONE (OXY IR/ROXICODONE) 5 MG immediate release tablet Take 5 mg by mouth every 6 (six) hours as needed for severe pain.    Yes  Historical Provider, MD  predniSONE (DELTASONE) 10 MG tablet 6, 5, 4, 3, 2 then 1 tablet by mouth daily for 6 days total. Patient taking differently: Take 10 mg by mouth daily.  07/09/14  Yes Evalee Jefferson, PA-C  PROAIR HFA 108 (90 BASE) MCG/ACT inhaler Inhale 2 puffs into the lungs every 4 (four) hours as needed. Shortness of breath/wheeze 12/05/13  Yes Historical Provider, MD  spironolactone (ALDACTONE) 25 MG tablet Take 25 mg by mouth 2 (two) times daily. 12/03/13  Yes Historical Provider, MD  SYMBICORT 160-4.5 MCG/ACT inhaler Inhale 1 puff into the lungs 2 (two) times daily. 12/08/13  Yes Historical Provider, MD  Tiotropium Bromide Monohydrate 2.5 MCG/ACT AERS Inhale 2 puffs into the lungs daily.   Yes Historical Provider, MD  traZODone (DESYREL) 150 MG tablet Take 300 mg by mouth at bedtime.  12/03/13  Yes Historical Provider, MD  vitamin B-12 (CYANOCOBALAMIN) 500 MCG tablet Take 500 mcg by mouth daily.   Yes Historical Provider, MD  XIFAXAN 550 MG TABS tablet Take 550 mg by mouth 2 (two) times daily. 12/03/13  Yes Historical Provider, MD    Allergies:   Allergies  Allergen Reactions  . Penicillins Anaphylaxis    Patient states he is not allergic to anything;  Pt says it's his twin brother that has this allergy.    Social History:  reports that he has  been smoking Cigarettes.  He has been smoking about 0.50 packs per day. He does not have any smokeless tobacco history on file. He reports that he does not drink alcohol or use illicit drugs.  Family History: uncontributory  Physical Exam: Filed Vitals:   08/29/14 2106 08/29/14 2128 08/29/14 2237 08/29/14 2309  BP:    138/76  Pulse:    86  Temp:    97.9 F (36.6 C)  TempSrc:    Oral  Resp:    28  Height:      Weight:      SpO2: 98% 100% 100% 91%   General appearance: alert, cooperative and no distress Head: Normocephalic, without obvious abnormality, atraumatic Eyes: negative Nose: Nares normal. Septum midline. Mucosa normal. No  drainage or sinus tenderness. Neck: no JVD and supple, symmetrical, trachea midline Lungs: diminished breath sounds bilaterally Heart: regular rate and rhythm, S1, S2 normal, no murmur, click, rub or gallop Abdomen: soft, non-tender; bowel sounds normal; no masses,  no organomegaly Extremities: extremities normal, atraumatic, no cyanosis or edema Pulses: 2+ and symmetric Skin: Skin color, texture, turgor normal. No rashes or lesions Neurologic: Grossly normal    Labs on Admission:   Recent Labs  08/29/14 2130  NA 140  K 4.3  CL 106  CO2 28  GLUCOSE 102*  BUN 41*  CREATININE 1.90*  CALCIUM 8.3*    Recent Labs  08/29/14 2130  AST 18  ALT 21  ALKPHOS 65  BILITOT 0.6  PROT 6.9  ALBUMIN 3.7     Recent Labs  08/29/14 2130  WBC 6.7  NEUTROABS 5.2  HGB 14.2  HCT 42.3  MCV 103.4*  PLT 85*    Recent Labs  08/29/14 2130  TROPONINI <0.03    Radiological Exams on Admission: Dg Chest Portable 1 View  08/29/2014   CLINICAL DATA:  Generalized chest pain today with shortness of breath history of COPD  EXAM: PORTABLE CHEST - 1 VIEW  COMPARISON:  03/18/2014  FINDINGS: Stable mild to moderate cardiac enlargement. Vascular pattern normal. Allowing for overlying soft tissue attenuation no abnormal opacities.  IMPRESSION: No active disease.   Electronically Signed   By: Skipper Cliche M.D.   On: 08/29/2014 21:30    Assessment/Plan  49 yo male with acute respiratory distress from copde accompanied by atypical chest pain  Principal Problem:   Acute respiratory failure with hypoxia-  Treat copde see below.  Active Problems:   COPD exacerbation-  Iv solumedrol.  freq nebs.  levaquin po.  Much improved already.  Monitor in stepdown overnight.   Chest pain-  Romi, aspirin, ck 2 d echo in am.  Likely related to his respiratory distress than actual ACS   Cirrhosis- stable   Hypertension-  stable   CKD (chronic kidney disease)-  Cr usually around 1.6, noted to be 1.9 today.   Pt euvolemic.  Admit to stepdown,  Full code.  Vannah Nadal A 08/30/2014, 12:03 AM

## 2014-08-30 NOTE — Progress Notes (Signed)
  Echocardiogram 2D Echocardiogram has been performed.  Lysle Rubens 08/30/2014, 10:37 AM

## 2014-08-30 NOTE — ED Notes (Signed)
Patient placed on 2 liters of oxygen via nasal canula

## 2014-08-30 NOTE — Progress Notes (Signed)
ANTIBIOTIC CONSULT NOTE - INITIAL  Pharmacy Consult for Levaquin Indication: COPD exacerbation  Allergies  Allergen Reactions  . Penicillins Anaphylaxis    Patient states he is not allergic to anything;  Pt says it's his twin brother that has this allergy.    Patient Measurements: Height: 6\' 3"  (190.5 cm) Weight: (!) 328 lb 7.8 oz (149 kg) IBW/kg (Calculated) : 84.5 Adjusted Body Weight:   Vital Signs: Temp: 98.6 F (37 C) (04/17 0746) Temp Source: Axillary (04/17 0746) BP: 149/69 mmHg (04/17 0700) Pulse Rate: 86 (04/17 0700) Intake/Output from previous day: 04/16 0701 - 04/17 0700 In: 240 [P.O.:240] Out: 400 [Urine:400] Intake/Output from this shift:    Labs:  Recent Labs  08/29/14 2130 08/30/14 0438  WBC 6.7 6.2  HGB 14.2 14.0  PLT 85* 85*  CREATININE 1.90* 1.71*   Estimated Creatinine Clearance: 80.6 mL/min (by C-G formula based on Cr of 1.71). No results for input(s): VANCOTROUGH, VANCOPEAK, VANCORANDOM, GENTTROUGH, GENTPEAK, GENTRANDOM, TOBRATROUGH, TOBRAPEAK, TOBRARND, AMIKACINPEAK, AMIKACINTROU, AMIKACIN in the last 72 hours.   Microbiology: Recent Results (from the past 720 hour(s))  MRSA PCR Screening     Status: None   Collection Time: 08/30/14  1:00 AM  Result Value Ref Range Status   MRSA by PCR NEGATIVE NEGATIVE Final    Comment:        The GeneXpert MRSA Assay (FDA approved for NASAL specimens only), is one component of a comprehensive MRSA colonization surveillance program. It is not intended to diagnose MRSA infection nor to guide or monitor treatment for MRSA infections.     Medical History: Past Medical History  Diagnosis Date  . COPD (chronic obstructive pulmonary disease)   . Cirrhosis   . Hypertension     Medications:  Scheduled:  . ARIPiprazole  30 mg Oral QHS  . aspirin EC  81 mg Oral Daily  . budesonide-formoterol  1 puff Inhalation BID  . citalopram  40 mg Oral QHS  . dextromethorphan  30 mg Oral Q12H  . enoxaparin  (LOVENOX) injection  40 mg Subcutaneous Q24H  . furosemide  40 mg Oral Daily  . gabapentin  300 mg Oral TID  . ipratropium-albuterol  3 mL Nebulization Q6H  . levofloxacin  500 mg Oral q1800  . methylPREDNISolone (SOLU-MEDROL) injection  80 mg Intravenous Q12H  . metoprolol succinate  50 mg Oral Daily  . [START ON 08/31/2014] pneumococcal 23 valent vaccine  0.5 mL Intramuscular Tomorrow-1000  . rifaximin  550 mg Oral BID  . sodium chloride  3 mL Intravenous Q12H  . spironolactone  25 mg Oral BID  . traZODone  300 mg Oral QHS   Assessment: 49 yo male with acute respiratory distress from copde accompanied by atypical chest pain.  COPD exacerbation Levaquin 500 mg IV given in ED  Goal of Therapy:  Eradicate infection  Plan:  Levaquin 500 mg po every 24 hours Monitor renal function F/U LOT  Abner Greenspan, Travian Kerner Bennett 08/30/2014,8:13 AM

## 2014-08-30 NOTE — Plan of Care (Signed)
Problem: ICU Phase Progression Outcomes Goal: O2 sats trending toward baseline Outcome: Progressing Titration of  O2 to keep sats greater than 92% Goal: Dyspnea controlled at rest Outcome: Progressing No complaints at this time Goal: Hemodynamically stable Outcome: Progressing Vital signs are stable on current therapy Goal: Pain controlled with appropriate interventions Outcome: Progressing PRN medications ordered and given as needed Goal: Initial discharge plan identified Outcome: Completed/Met Date Met:  08/30/14 Back to [facility]

## 2014-08-30 NOTE — Progress Notes (Signed)
Subjective: Patient was admitted last night due to shortness of breath secondary to COPD exacerbation. He had also chest pain which is better now. His troponin is negative.  Objective: Vital signs in last 24 hours: Temp:  [97.5 F (36.4 C)-98.6 F (37 C)] 97.8 F (36.6 C) (04/17 1100) Pulse Rate:  [68-89] 73 (04/17 1200) Resp:  [19-28] 19 (04/17 1200) BP: (113-149)/(48-99) 124/66 mmHg (04/17 1200) SpO2:  [91 %-100 %] 97 % (04/17 1437) FiO2 (%):  [28 %] 28 % (04/17 0058) Weight:  [147.419 kg (325 lb)-149 kg (328 lb 7.8 oz)] 149 kg (328 lb 7.8 oz) (04/17 0500) Weight change:  Last BM Date: 08/28/14  Intake/Output from previous day: 04/16 0701 - 04/17 0700 In: 240 [P.O.:240] Out: 400 [Urine:400]  PHYSICAL EXAM General appearance: alert, no distress and moderately obese Resp: diminished breath sounds bilaterally, rhonchi bilaterally and wheezes bilaterally Cardio: S1, S2 normal GI: soft, non-tender; bowel sounds normal; no masses,  no organomegaly Extremities: extremities normal, atraumatic, no cyanosis or edema  Lab Results:  Results for orders placed or performed during the hospital encounter of 08/29/14 (from the past 48 hour(s))  CBC with Differential     Status: Abnormal   Collection Time: 08/29/14  9:30 PM  Result Value Ref Range   WBC 6.7 4.0 - 10.5 K/uL   RBC 4.09 (L) 4.22 - 5.81 MIL/uL   Hemoglobin 14.2 13.0 - 17.0 g/dL   HCT 42.3 39.0 - 52.0 %   MCV 103.4 (H) 78.0 - 100.0 fL   MCH 34.7 (H) 26.0 - 34.0 pg   MCHC 33.6 30.0 - 36.0 g/dL   RDW 14.7 11.5 - 15.5 %   Platelets 85 (L) 150 - 400 K/uL    Comment: SPECIMEN CHECKED FOR CLOTS   Neutrophils Relative % 77 43 - 77 %   Neutro Abs 5.2 1.7 - 7.7 K/uL   Lymphocytes Relative 17 12 - 46 %   Lymphs Abs 1.1 0.7 - 4.0 K/uL   Monocytes Relative 5 3 - 12 %   Monocytes Absolute 0.3 0.1 - 1.0 K/uL   Eosinophils Relative 1 0 - 5 %   Eosinophils Absolute 0.0 0.0 - 0.7 K/uL   Basophils Relative 0 0 - 1 %   Basophils  Absolute 0.0 0.0 - 0.1 K/uL  Comprehensive metabolic panel     Status: Abnormal   Collection Time: 08/29/14  9:30 PM  Result Value Ref Range   Sodium 140 135 - 145 mmol/L   Potassium 4.3 3.5 - 5.1 mmol/L   Chloride 106 96 - 112 mmol/L   CO2 28 19 - 32 mmol/L   Glucose, Bld 102 (H) 70 - 99 mg/dL   BUN 41 (H) 6 - 23 mg/dL   Creatinine, Ser 1.90 (H) 0.50 - 1.35 mg/dL   Calcium 8.3 (L) 8.4 - 10.5 mg/dL   Total Protein 6.9 6.0 - 8.3 g/dL   Albumin 3.7 3.5 - 5.2 g/dL   AST 18 0 - 37 U/L   ALT 21 0 - 53 U/L   Alkaline Phosphatase 65 39 - 117 U/L   Total Bilirubin 0.6 0.3 - 1.2 mg/dL   GFR calc non Af Amer 40 (L) >90 mL/min   GFR calc Af Amer 46 (L) >90 mL/min    Comment: (NOTE) The eGFR has been calculated using the CKD EPI equation. This calculation has not been validated in all clinical situations. eGFR's persistently <90 mL/min signify possible Chronic Kidney Disease.    Anion gap 6 5 -  15  Troponin I     Status: None   Collection Time: 08/29/14  9:30 PM  Result Value Ref Range   Troponin I <0.03 <0.031 ng/mL    Comment:        NO INDICATION OF MYOCARDIAL INJURY.   Blood gas, arterial     Status: Abnormal   Collection Time: 08/30/14 12:01 AM  Result Value Ref Range   FIO2 0.28 %   Delivery systems NASAL CANNULA    pH, Arterial 7.360 7.350 - 7.450   pCO2 arterial 48.6 (H) 35.0 - 45.0 mmHg   pO2, Arterial 74.6 (L) 80.0 - 100.0 mmHg   Bicarbonate 26.8 (H) 20.0 - 24.0 mEq/L   TCO2 23.6 0 - 100 mmol/L   Acid-Base Excess 1.9 0.0 - 2.0 mmol/L   O2 Saturation 95.3 %   Patient temperature 37.0    Collection site RADIAL    Drawn by [id number]    Sample type ARTERIAL    Allens test (pass/fail) PASS PASS  MRSA PCR Screening     Status: None   Collection Time: 08/30/14  1:00 AM  Result Value Ref Range   MRSA by PCR NEGATIVE NEGATIVE    Comment:        The GeneXpert MRSA Assay (FDA approved for NASAL specimens only), is one component of a comprehensive MRSA  colonization surveillance program. It is not intended to diagnose MRSA infection nor to guide or monitor treatment for MRSA infections.   Troponin I     Status: None   Collection Time: 08/30/14  1:49 AM  Result Value Ref Range   Troponin I <0.03 <0.031 ng/mL    Comment:        NO INDICATION OF MYOCARDIAL INJURY.   Basic metabolic panel     Status: Abnormal   Collection Time: 08/30/14  4:38 AM  Result Value Ref Range   Sodium 139 135 - 145 mmol/L   Potassium 4.2 3.5 - 5.1 mmol/L   Chloride 105 96 - 112 mmol/L   CO2 25 19 - 32 mmol/L   Glucose, Bld 199 (H) 70 - 99 mg/dL   BUN 41 (H) 6 - 23 mg/dL   Creatinine, Ser 1.71 (H) 0.50 - 1.35 mg/dL   Calcium 8.3 (L) 8.4 - 10.5 mg/dL   GFR calc non Af Amer 45 (L) >90 mL/min   GFR calc Af Amer 52 (L) >90 mL/min    Comment: (NOTE) The eGFR has been calculated using the CKD EPI equation. This calculation has not been validated in all clinical situations. eGFR's persistently <90 mL/min signify possible Chronic Kidney Disease.    Anion gap 9 5 - 15  CBC     Status: Abnormal   Collection Time: 08/30/14  4:38 AM  Result Value Ref Range   WBC 6.2 4.0 - 10.5 K/uL   RBC 4.06 (L) 4.22 - 5.81 MIL/uL   Hemoglobin 14.0 13.0 - 17.0 g/dL   HCT 42.2 39.0 - 52.0 %   MCV 103.9 (H) 78.0 - 100.0 fL   MCH 34.5 (H) 26.0 - 34.0 pg   MCHC 33.2 30.0 - 36.0 g/dL   RDW 14.5 11.5 - 15.5 %   Platelets 85 (L) 150 - 400 K/uL    Comment: SPECIMEN CHECKED FOR CLOTS PLATELET COUNT CONFIRMED BY SMEAR   Troponin I     Status: None   Collection Time: 08/30/14  6:56 AM  Result Value Ref Range   Troponin I <0.03 <0.031 ng/mL    Comment:  NO INDICATION OF MYOCARDIAL INJURY.   Troponin I     Status: None   Collection Time: 08/30/14  1:00 PM  Result Value Ref Range   Troponin I <0.03 <0.031 ng/mL    Comment:        NO INDICATION OF MYOCARDIAL INJURY.     ABGS  Recent Labs  08/30/14 0001  PHART 7.360  PO2ART 74.6*  TCO2 23.6  HCO3 26.8*    CULTURES Recent Results (from the past 240 hour(s))  MRSA PCR Screening     Status: None   Collection Time: 08/30/14  1:00 AM  Result Value Ref Range Status   MRSA by PCR NEGATIVE NEGATIVE Final    Comment:        The GeneXpert MRSA Assay (FDA approved for NASAL specimens only), is one component of a comprehensive MRSA colonization surveillance program. It is not intended to diagnose MRSA infection nor to guide or monitor treatment for MRSA infections.    Studies/Results: Dg Chest Portable 1 View  08/29/2014   CLINICAL DATA:  Generalized chest pain today with shortness of breath history of COPD  EXAM: PORTABLE CHEST - 1 VIEW  COMPARISON:  03/18/2014  FINDINGS: Stable mild to moderate cardiac enlargement. Vascular pattern normal. Allowing for overlying soft tissue attenuation no abnormal opacities.  IMPRESSION: No active disease.   Electronically Signed   By: Skipper Cliche M.D.   On: 08/29/2014 21:30    Medications: I have reviewed the patient's current medications.  Assesment: 2  Principal Problem:   Acute respiratory failure with hypoxia Active Problems:   COPD exacerbation   Chest pain   Cirrhosis   Hypertension   CKD (chronic kidney disease)    Plan:  Medications reviewed Will continue nebulizer treatment, IV steroid and antibiotics Pulmonary consult Continue regular treatment.    LOS: 1 day   Maylen Waltermire 08/30/2014, 3:05 PM

## 2014-08-31 DIAGNOSIS — J441 Chronic obstructive pulmonary disease with (acute) exacerbation: Secondary | ICD-10-CM | POA: Diagnosis not present

## 2014-08-31 NOTE — Care Management Utilization Note (Signed)
UR completed 

## 2014-08-31 NOTE — Care Management Note (Addendum)
    Page 1 of 1   09/02/2014     8:40:36 AM CARE MANAGEMENT NOTE 09/02/2014  Patient:  Jorge [name], Jorge [name]   Account Number:  [id number]  Date Initiated:  08/31/2014  Documentation initiated by:  Jolene Provost  Subjective/Objective Assessment:   Pt admitted with SOB. Pt is from [facility]. Pt plans on returning to the facility at discharge. CSW is aware and will arrange for return to facility. No CM needs.     Action/Plan:   Anticipated DC Date:  09/01/2014   Anticipated DC Plan:  ASSISTED LIVING / REST HOME  In-house referral  Clinical Social Worker      DC Planning Services  CM consult      Choice offered to / List presented to:             Status of service:  Completed, signed off Medicare Important Message given?  YES (If response is "NO", the following Medicare IM given date fields will be blank) Date Medicare IM given:  09/02/2014 Medicare IM given by:  Jolene Provost Date Additional Medicare IM given:   Additional Medicare IM given by:    Discharge Disposition:  ASSISTED LIVING  Per UR Regulation:  Reviewed for med. necessity/level of care/duration of stay  If discussed at [place] of Stay Meetings, dates discussed:    Comments:  09/02/2014 0800 Jolene Provost, RN, MSN,CM Pt discharging back to ALF today. CSW aware and will arrange for return to facility. No CM needs. 08/31/2014 [place], RN, MSN, CM

## 2014-08-31 NOTE — Progress Notes (Signed)
PT ALERT AND ORIENTED. UP IN RECLINER.O2 AT 3L/MIN VIA Happy. SOB W/ ANY EXERTION. LT FOREARM IV PATENT. TRANSFERRING TO ROOM, 308. TRANSFER REPORT GIVEN TO JESSICA MAYS RN ON 300.

## 2014-08-31 NOTE — Progress Notes (Signed)
Subjective: Patient feels better. No chest pain. His breathing is improving. No fever or chills..  Objective: Vital signs in last 24 hours: Temp:  [97.4 F (36.3 C)-97.8 F (36.6 C)] 97.4 F (36.3 C) (04/18 0400) Pulse Rate:  [53-93] 61 (04/18 0603) Resp:  [15-23] 18 (04/18 0603) BP: (124-153)/(55-125) 135/60 mmHg (04/18 0603) SpO2:  [92 %-100 %] 92 % (04/18 0603) FiO2 (%):  [28 %] 28 % (04/18 0400) Weight:  [147.6 kg (325 lb 6.4 oz)] 147.6 kg (325 lb 6.4 oz) (04/18 0500) Weight change: 0.181 kg (6.4 oz) Last BM Date: 08/28/14  Intake/Output from previous day: 04/17 0701 - 04/18 0700 In: 723 [P.O.:720; I.V.:3] Out: 3000 [Urine:3000]  PHYSICAL EXAM General appearance: alert, no distress and moderately obese Resp: diminished breath sounds bilaterally, rhonchi bilaterally and wheezes bilaterally Cardio: S1, S2 normal GI: soft, non-tender; bowel sounds normal; no masses,  no organomegaly Extremities: extremities normal, atraumatic, no cyanosis or edema  Lab Results:  Results for orders placed or performed during the hospital encounter of 08/29/14 (from the past 48 hour(s))  CBC with Differential     Status: Abnormal   Collection Time: 08/29/14  9:30 PM  Result Value Ref Range   WBC 6.7 4.0 - 10.5 K/uL   RBC 4.09 (L) 4.22 - 5.81 MIL/uL   Hemoglobin 14.2 13.0 - 17.0 g/dL   HCT 42.3 39.0 - 52.0 %   MCV 103.4 (H) 78.0 - 100.0 fL   MCH 34.7 (H) 26.0 - 34.0 pg   MCHC 33.6 30.0 - 36.0 g/dL   RDW 14.7 11.5 - 15.5 %   Platelets 85 (L) 150 - 400 K/uL    Comment: SPECIMEN CHECKED FOR CLOTS   Neutrophils Relative % 77 43 - 77 %   Neutro Abs 5.2 1.7 - 7.7 K/uL   Lymphocytes Relative 17 12 - 46 %   Lymphs Abs 1.1 0.7 - 4.0 K/uL   Monocytes Relative 5 3 - 12 %   Monocytes Absolute 0.3 0.1 - 1.0 K/uL   Eosinophils Relative 1 0 - 5 %   Eosinophils Absolute 0.0 0.0 - 0.7 K/uL   Basophils Relative 0 0 - 1 %   Basophils Absolute 0.0 0.0 - 0.1 K/uL  Comprehensive metabolic panel      Status: Abnormal   Collection Time: 08/29/14  9:30 PM  Result Value Ref Range   Sodium 140 135 - 145 mmol/L   Potassium 4.3 3.5 - 5.1 mmol/L   Chloride 106 96 - 112 mmol/L   CO2 28 19 - 32 mmol/L   Glucose, Bld 102 (H) 70 - 99 mg/dL   BUN 41 (H) 6 - 23 mg/dL   Creatinine, Ser 1.90 (H) 0.50 - 1.35 mg/dL   Calcium 8.3 (L) 8.4 - 10.5 mg/dL   Total Protein 6.9 6.0 - 8.3 g/dL   Albumin 3.7 3.5 - 5.2 g/dL   AST 18 0 - 37 U/L   ALT 21 0 - 53 U/L   Alkaline Phosphatase 65 39 - 117 U/L   Total Bilirubin 0.6 0.3 - 1.2 mg/dL   GFR calc non Af Amer 40 (L) >90 mL/min   GFR calc Af Amer 46 (L) >90 mL/min    Comment: (NOTE) The eGFR has been calculated using the CKD EPI equation. This calculation has not been validated in all clinical situations. eGFR's persistently <90 mL/min signify possible Chronic Kidney Disease.    Anion gap 6 5 - 15  Troponin I     Status: None  Collection Time: 08/29/14  9:30 PM  Result Value Ref Range   Troponin I <0.03 <0.031 ng/mL    Comment:        NO INDICATION OF MYOCARDIAL INJURY.   Blood gas, arterial     Status: Abnormal   Collection Time: 08/30/14 12:01 AM  Result Value Ref Range   FIO2 0.28 %   Delivery systems NASAL CANNULA    pH, Arterial 7.360 7.350 - 7.450   pCO2 arterial 48.6 (H) 35.0 - 45.0 mmHg   pO2, Arterial 74.6 (L) 80.0 - 100.0 mmHg   Bicarbonate 26.8 (H) 20.0 - 24.0 mEq/L   TCO2 23.6 0 - 100 mmol/L   Acid-Base Excess 1.9 0.0 - 2.0 mmol/L   O2 Saturation 95.3 %   Patient temperature 37.0    Collection site RADIAL    Drawn by [id number]    Sample type ARTERIAL    Allens test (pass/fail) PASS PASS  MRSA PCR Screening     Status: None   Collection Time: 08/30/14  1:00 AM  Result Value Ref Range   MRSA by PCR NEGATIVE NEGATIVE    Comment:        The GeneXpert MRSA Assay (FDA approved for NASAL specimens only), is one component of a comprehensive MRSA colonization surveillance program. It is not intended to diagnose MRSA infection  nor to guide or monitor treatment for MRSA infections.   Troponin I     Status: None   Collection Time: 08/30/14  1:49 AM  Result Value Ref Range   Troponin I <0.03 <0.031 ng/mL    Comment:        NO INDICATION OF MYOCARDIAL INJURY.   Basic metabolic panel     Status: Abnormal   Collection Time: 08/30/14  4:38 AM  Result Value Ref Range   Sodium 139 135 - 145 mmol/L   Potassium 4.2 3.5 - 5.1 mmol/L   Chloride 105 96 - 112 mmol/L   CO2 25 19 - 32 mmol/L   Glucose, Bld 199 (H) 70 - 99 mg/dL   BUN 41 (H) 6 - 23 mg/dL   Creatinine, Ser 1.71 (H) 0.50 - 1.35 mg/dL   Calcium 8.3 (L) 8.4 - 10.5 mg/dL   GFR calc non Af Amer 45 (L) >90 mL/min   GFR calc Af Amer 52 (L) >90 mL/min    Comment: (NOTE) The eGFR has been calculated using the CKD EPI equation. This calculation has not been validated in all clinical situations. eGFR's persistently <90 mL/min signify possible Chronic Kidney Disease.    Anion gap 9 5 - 15  CBC     Status: Abnormal   Collection Time: 08/30/14  4:38 AM  Result Value Ref Range   WBC 6.2 4.0 - 10.5 K/uL   RBC 4.06 (L) 4.22 - 5.81 MIL/uL   Hemoglobin 14.0 13.0 - 17.0 g/dL   HCT 42.2 39.0 - 52.0 %   MCV 103.9 (H) 78.0 - 100.0 fL   MCH 34.5 (H) 26.0 - 34.0 pg   MCHC 33.2 30.0 - 36.0 g/dL   RDW 14.5 11.5 - 15.5 %   Platelets 85 (L) 150 - 400 K/uL    Comment: SPECIMEN CHECKED FOR CLOTS PLATELET COUNT CONFIRMED BY SMEAR   Troponin I     Status: None   Collection Time: 08/30/14  6:56 AM  Result Value Ref Range   Troponin I <0.03 <0.031 ng/mL    Comment:        NO INDICATION OF MYOCARDIAL INJURY.  Troponin I     Status: None   Collection Time: 08/30/14  1:00 PM  Result Value Ref Range   Troponin I <0.03 <0.031 ng/mL    Comment:        NO INDICATION OF MYOCARDIAL INJURY.     ABGS  Recent Labs  08/30/14 0001  PHART 7.360  PO2ART 74.6*  TCO2 23.6  HCO3 26.8*   CULTURES Recent Results (from the past 240 hour(s))  MRSA PCR Screening      Status: None   Collection Time: 08/30/14  1:00 AM  Result Value Ref Range Status   MRSA by PCR NEGATIVE NEGATIVE Final    Comment:        The GeneXpert MRSA Assay (FDA approved for NASAL specimens only), is one component of a comprehensive MRSA colonization surveillance program. It is not intended to diagnose MRSA infection nor to guide or monitor treatment for MRSA infections.    Studies/Results: Dg Chest Portable 1 View  08/29/2014   CLINICAL DATA:  Generalized chest pain today with shortness of breath history of COPD  EXAM: PORTABLE CHEST - 1 VIEW  COMPARISON:  03/18/2014  FINDINGS: Stable mild to moderate cardiac enlargement. Vascular pattern normal. Allowing for overlying soft tissue attenuation no abnormal opacities.  IMPRESSION: No active disease.   Electronically Signed   By: Skipper Cliche M.D.   On: 08/29/2014 21:30    Medications: I have reviewed the patient's current medications.  Assesment: 2  Principal Problem:   Acute respiratory failure with hypoxia Active Problems:   COPD exacerbation   Chest pain   Cirrhosis   Hypertension   CKD (chronic kidney disease)    Plan:  Medications reviewed Will continue nebulizer treatment, IV steroid and antibiotics Pulmonary consult CBC/BMP in am Continue regular treatment.    LOS: 2 days   Orine Goga 08/31/2014, 8:01 AM

## 2014-08-31 NOTE — Consult Note (Signed)
Consult requested by:Dr. Legrand Rams Consult requested for respiratory failure/COPD exacerbation:  HPI: This is a 49 year old who has a known history of COPD and who came to the emergency department with increasing shortness of breath. He also had chest pain. He said he was so short of breath he could not get his breath at all. He denied fever cough. He was treated in the emergency department but was not well enough for discharge. He has COPD and sleep apnea at baseline and is on Cipro At home. He continues to have cigarette use.  Past Medical History  Diagnosis Date  . COPD (chronic obstructive pulmonary disease)   . Cirrhosis   . Hypertension      History reviewed. No pertinent family history.   History   Social History  . Marital Status: Single    Spouse Name: N/A  . Number of Children: N/A  . Years of Education: N/A   Social History Main Topics  . Smoking status: Current Every Day Smoker -- 0.50 packs/day    Types: Cigarettes  . Smokeless tobacco: Not on file  . Alcohol Use: No  . Drug Use: No  . Sexual Activity: Not on file   Other Topics Concern  . None   Social History Narrative     ROS: No hemoptysis. No definite chills. No leg swelling. The rest as per the history and physical.    Objective: Vital signs in last 24 hours: Temp:  [97.4 F (36.3 C)-97.8 F (36.6 C)] 97.4 F (36.3 C) (04/18 0400) Pulse Rate:  [53-93] 61 (04/18 0603) Resp:  [15-23] 18 (04/18 0603) BP: (124-153)/(55-125) 135/60 mmHg (04/18 0603) SpO2:  [92 %-100 %] 92 % (04/18 0603) FiO2 (%):  [28 %] 28 % (04/18 0400) Weight:  [147.6 kg (325 lb 6.4 oz)] 147.6 kg (325 lb 6.4 oz) (04/18 0500) Weight change: 0.181 kg (6.4 oz) Last BM Date: 08/28/14  Intake/Output from previous day: 04/17 0701 - 04/18 0700 In: 723 [P.O.:720; I.V.:3] Out: 3000 [Urine:3000]  PHYSICAL EXAM He is an obese male in no acute distress. He says he feels better. His HEENT exam is generally unremarkable. His neck is  supple. His chest is clear with somewhat diminished breath sounds and prolonged expiration. His heart is regular. His abdomen is soft without masses. He has no edema.  Lab Results: Basic Metabolic Panel:  Recent Labs  08/29/14 2130 08/30/14 0438  NA 140 139  K 4.3 4.2  CL 106 105  CO2 28 25  GLUCOSE 102* 199*  BUN 41* 41*  CREATININE 1.90* 1.71*  CALCIUM 8.3* 8.3*   Liver Function Tests:  Recent Labs  08/29/14 2130  AST 18  ALT 21  ALKPHOS 65  BILITOT 0.6  PROT 6.9  ALBUMIN 3.7   No results for input(s): LIPASE, AMYLASE in the last 72 hours. No results for input(s): AMMONIA in the last 72 hours. CBC:  Recent Labs  08/29/14 2130 08/30/14 0438  WBC 6.7 6.2  NEUTROABS 5.2  --   HGB 14.2 14.0  HCT 42.3 42.2  MCV 103.4* 103.9*  PLT 85* 85*   Cardiac Enzymes:  Recent Labs  08/30/14 0149 08/30/14 0656 08/30/14 1300  TROPONINI <0.03 <0.03 <0.03   BNP: No results for input(s): PROBNP in the last 72 hours. D-Dimer: No results for input(s): DDIMER in the last 72 hours. CBG: No results for input(s): GLUCAP in the last 72 hours. Hemoglobin A1C: No results for input(s): HGBA1C in the last 72 hours. Fasting Lipid Panel: No results  for input(s): CHOL, HDL, LDLCALC, TRIG, CHOLHDL, LDLDIRECT in the last 72 hours. Thyroid Function Tests: No results for input(s): TSH, T4TOTAL, FREET4, T3FREE, THYROIDAB in the last 72 hours. Anemia Panel: No results for input(s): VITAMINB12, FOLATE, FERRITIN, TIBC, IRON, RETICCTPCT in the last 72 hours. Coagulation: No results for input(s): LABPROT, INR in the last 72 hours. Urine Drug Screen: Drugs of Abuse  No results found for: LABOPIA, COCAINSCRNUR, LABBENZ, AMPHETMU, THCU, LABBARB  Alcohol Level: No results for input(s): ETH in the last 72 hours. Urinalysis: No results for input(s): COLORURINE, LABSPEC, PHURINE, GLUCOSEU, HGBUR, BILIRUBINUR, KETONESUR, PROTEINUR, UROBILINOGEN, NITRITE, LEUKOCYTESUR in the last 72  hours.  Invalid input(s): APPERANCEUR Misc. Labs:   ABGS:  Recent Labs  08/30/14 0001  PHART 7.360  PO2ART 74.6*  TCO2 23.6  HCO3 26.8*     MICROBIOLOGY: Recent Results (from the past 240 hour(s))  MRSA PCR Screening     Status: None   Collection Time: 08/30/14  1:00 AM  Result Value Ref Range Status   MRSA by PCR NEGATIVE NEGATIVE Final    Comment:        The GeneXpert MRSA Assay (FDA approved for NASAL specimens only), is one component of a comprehensive MRSA colonization surveillance program. It is not intended to diagnose MRSA infection nor to guide or monitor treatment for MRSA infections.     Studies/Results: Dg Chest Portable 1 View  08/29/2014   CLINICAL DATA:  Generalized chest pain today with shortness of breath history of COPD  EXAM: PORTABLE CHEST - 1 VIEW  COMPARISON:  03/18/2014  FINDINGS: Stable mild to moderate cardiac enlargement. Vascular pattern normal. Allowing for overlying soft tissue attenuation no abnormal opacities.  IMPRESSION: No active disease.   Electronically Signed   By: Skipper Cliche M.D.   On: 08/29/2014 21:30    Medications:  Prior to Admission:  Prescriptions prior to admission  Medication Sig Dispense Refill Last Dose  . ARIPiprazole (ABILIFY) 30 MG tablet Take 30 mg by mouth at bedtime.    08/28/2014 at Unknown time  . ciprofloxacin (CIPRO) 750 MG tablet Take 750 mg by mouth every Wednesday.    08/26/2014  . citalopram (CELEXA) 40 MG tablet Take 40 mg by mouth at bedtime.    08/28/2014 at Unknown time  . dextromethorphan (DELSYM) 30 MG/5ML liquid Take 30 mg by mouth every 12 (twelve) hours.   08/29/2014 at Unknown time  . furosemide (LASIX) 40 MG tablet Take 40 mg by mouth daily.   08/29/2014 at Unknown time  . gabapentin (NEURONTIN) 300 MG capsule Take 300 mg by mouth 3 (three) times daily.   08/29/2014 at Unknown time  . metoprolol succinate (TOPROL-XL) 50 MG 24 hr tablet Take 50 mg by mouth daily. Take with or immediately  following a meal.   08/29/2014 at Unknown time  . omeprazole (PRILOSEC) 20 MG capsule Take 20 mg by mouth daily.   08/29/2014 at Unknown time  . oxyCODONE (OXY IR/ROXICODONE) 5 MG immediate release tablet Take 5 mg by mouth every 6 (six) hours as needed for severe pain.    08/28/2014 at Unknown time  . predniSONE (DELTASONE) 10 MG tablet 6, 5, 4, 3, 2 then 1 tablet by mouth daily for 6 days total. (Patient taking differently: Take 10 mg by mouth daily. ) 21 tablet 0 08/29/2014 at Unknown time  . PROAIR HFA 108 (90 BASE) MCG/ACT inhaler Inhale 2 puffs into the lungs every 4 (four) hours as needed. Shortness of breath/wheeze   unknown  .  spironolactone (ALDACTONE) 25 MG tablet Take 25 mg by mouth 2 (two) times daily.   08/29/2014 at Unknown time  . SYMBICORT 160-4.5 MCG/ACT inhaler Inhale 1 puff into the lungs 2 (two) times daily.   08/29/2014 at Unknown time  . Tiotropium Bromide Monohydrate 2.5 MCG/ACT AERS Inhale 2 puffs into the lungs daily.   08/29/2014 at Unknown time  . traZODone (DESYREL) 150 MG tablet Take 300 mg by mouth at bedtime.    08/28/2014 at Unknown time  . vitamin B-12 (CYANOCOBALAMIN) 500 MCG tablet Take 500 mcg by mouth daily.   08/29/2014 at Unknown time  . XIFAXAN 550 MG TABS tablet Take 550 mg by mouth 2 (two) times daily.   08/29/2014 at Unknown time   Scheduled: . ARIPiprazole  30 mg Oral QHS  . aspirin EC  81 mg Oral Daily  . budesonide-formoterol  1 puff Inhalation BID  . citalopram  40 mg Oral QHS  . dextromethorphan  30 mg Oral Q12H  . enoxaparin (LOVENOX) injection  70 mg Subcutaneous Q24H  . furosemide  40 mg Oral Daily  . gabapentin  300 mg Oral TID  . ipratropium-albuterol  3 mL Nebulization Q6H  . levofloxacin  500 mg Oral q1800  . methylPREDNISolone (SOLU-MEDROL) injection  80 mg Intravenous Q12H  . metoprolol succinate  50 mg Oral Daily  . pneumococcal 23 valent vaccine  0.5 mL Intramuscular Tomorrow-1000  . rifaximin  550 mg Oral BID  . sodium chloride  3 mL  Intravenous Q12H  . spironolactone  25 mg Oral BID  . traZODone  300 mg Oral QHS   Continuous:  LFY:BOFBPZ chloride, albuterol, morphine injection, oxyCODONE, sodium chloride  Assesment: He was admitted with acute hypoxic respiratory failure from COPD exacerbation. He is much improved. I think he's probably okay for transfer out of the intensive care unit from a pulmonary point of view Principal Problem:   Acute respiratory failure with hypoxia Active Problems:   COPD exacerbation   Chest pain   Cirrhosis   Hypertension   CKD (chronic kidney disease)    Plan: Continue current treatments he is on inhaled bronchodilators and IV steroids and antibiotics    LOS: 2 days   Iver Miklas L 08/31/2014, 8:12 AM

## 2014-09-01 LAB — CBC
HEMATOCRIT: 46.6 % (ref 39.0–52.0)
HEMOGLOBIN: 14.9 g/dL (ref 13.0–17.0)
MCH: 33.6 pg (ref 26.0–34.0)
MCHC: 32 g/dL (ref 30.0–36.0)
MCV: 105.2 fL — AB (ref 78.0–100.0)
Platelets: 88 10*3/uL — ABNORMAL LOW (ref 150–400)
RBC: 4.43 MIL/uL (ref 4.22–5.81)
RDW: 14.4 % (ref 11.5–15.5)
WBC: 6.1 10*3/uL (ref 4.0–10.5)

## 2014-09-01 LAB — BASIC METABOLIC PANEL
Anion gap: 7 (ref 5–15)
BUN: 36 mg/dL — AB (ref 6–23)
CHLORIDE: 102 mmol/L (ref 96–112)
CO2: 31 mmol/L (ref 19–32)
Calcium: 8.7 mg/dL (ref 8.4–10.5)
Creatinine, Ser: 1.35 mg/dL (ref 0.50–1.35)
GFR calc non Af Amer: 60 mL/min — ABNORMAL LOW (ref >90)
GFR, EST AFRICAN AMERICAN: 69 mL/min — AB (ref >90)
Glucose, Bld: 161 mg/dL — ABNORMAL HIGH (ref 70–99)
Potassium: 5 mmol/L (ref 3.5–5.1)
Sodium: 140 mmol/L (ref 135–145)

## 2014-09-01 MED ORDER — METHYLPREDNISOLONE SODIUM SUCC 40 MG IJ SOLR
40.0000 mg | Freq: Two times a day (BID) | INTRAMUSCULAR | Status: DC
  Administered 2014-09-01 – 2014-09-02 (×3): 40 mg via INTRAVENOUS
  Filled 2014-09-01 (×3): qty 1

## 2014-09-01 NOTE — Progress Notes (Signed)
Patient refused CPAP use tonight. Hospital provided unit still at bedside at this time. Patient placed on 2 lpm nasal cannula.

## 2014-09-01 NOTE — Progress Notes (Signed)
Subjective: He says he feels much better and is approaching baseline  Objective: Vital signs in last 24 hours: Temp:  [97.5 F (36.4 C)] 97.5 F (36.4 C) (04/19 0654) Pulse Rate:  [59-69] 60 (04/19 0654) Resp:  [15-20] 20 (04/19 0654) BP: (107-135)/(56-91) 126/76 mmHg (04/19 0654) SpO2:  [88 %-100 %] 94 % (04/19 0739) Weight:  [145.65 kg (321 lb 1.6 oz)] 145.65 kg (321 lb 1.6 oz) (04/19 0654) Weight change: -1.95 kg (-4 lb 4.8 oz) Last BM Date: 08/28/14  Intake/Output from previous day: 04/18 0701 - 04/19 0700 In: 720 [P.O.:720] Out: 2675 [Urine:2675]  PHYSICAL EXAM General appearance: alert, cooperative and no distress Resp: wheezes bilaterally Cardio: regular rate and rhythm, S1, S2 normal, no murmur, click, rub or gallop GI: soft, non-tender; bowel sounds normal; no masses,  no organomegaly Extremities: extremities normal, atraumatic, no cyanosis or edema  Lab Results:  Results for orders placed or performed during the hospital encounter of 08/29/14 (from the past 48 hour(s))  Troponin I     Status: None   Collection Time: 08/30/14  1:00 PM  Result Value Ref Range   Troponin I <0.03 <0.031 ng/mL    Comment:        NO INDICATION OF MYOCARDIAL INJURY.   Basic metabolic panel     Status: Abnormal   Collection Time: 09/01/14  5:34 AM  Result Value Ref Range   Sodium 140 135 - 145 mmol/L   Potassium 5.0 3.5 - 5.1 mmol/L   Chloride 102 96 - 112 mmol/L   CO2 31 19 - 32 mmol/L   Glucose, Bld 161 (H) 70 - 99 mg/dL   BUN 36 (H) 6 - 23 mg/dL   Creatinine, Ser 1.35 0.50 - 1.35 mg/dL   Calcium 8.7 8.4 - 10.5 mg/dL   GFR calc non Af Amer 60 (L) >90 mL/min   GFR calc Af Amer 69 (L) >90 mL/min    Comment: (NOTE) The eGFR has been calculated using the CKD EPI equation. This calculation has not been validated in all clinical situations. eGFR's persistently <90 mL/min signify possible Chronic Kidney Disease.    Anion gap 7 5 - 15  CBC     Status: Abnormal   Collection  Time: 09/01/14  5:34 AM  Result Value Ref Range   WBC 6.1 4.0 - 10.5 K/uL   RBC 4.43 4.22 - 5.81 MIL/uL   Hemoglobin 14.9 13.0 - 17.0 g/dL   HCT 46.6 39.0 - 52.0 %   MCV 105.2 (H) 78.0 - 100.0 fL   MCH 33.6 26.0 - 34.0 pg   MCHC 32.0 30.0 - 36.0 g/dL   RDW 14.4 11.5 - 15.5 %   Platelets 88 (L) 150 - 400 K/uL    Comment: SPECIMEN CHECKED FOR CLOTS CONSISTENT WITH PREVIOUS RESULT     ABGS  Recent Labs  08/30/14 0001  PHART 7.360  PO2ART 74.6*  TCO2 23.6  HCO3 26.8*   CULTURES Recent Results (from the past 240 hour(s))  MRSA PCR Screening     Status: None   Collection Time: 08/30/14  1:00 AM  Result Value Ref Range Status   MRSA by PCR NEGATIVE NEGATIVE Final    Comment:        The GeneXpert MRSA Assay (FDA approved for NASAL specimens only), is one component of a comprehensive MRSA colonization surveillance program. It is not intended to diagnose MRSA infection nor to guide or monitor treatment for MRSA infections.    Studies/Results: No results found.  Medications:  Prior to Admission:  Prescriptions prior to admission  Medication Sig Dispense Refill Last Dose  . ARIPiprazole (ABILIFY) 30 MG tablet Take 30 mg by mouth at bedtime.    08/28/2014 at Unknown time  . ciprofloxacin (CIPRO) 750 MG tablet Take 750 mg by mouth every Wednesday.    08/26/2014  . citalopram (CELEXA) 40 MG tablet Take 40 mg by mouth at bedtime.    08/28/2014 at Unknown time  . dextromethorphan (DELSYM) 30 MG/5ML liquid Take 30 mg by mouth every 12 (twelve) hours.   08/29/2014 at Unknown time  . furosemide (LASIX) 40 MG tablet Take 40 mg by mouth daily.   08/29/2014 at Unknown time  . gabapentin (NEURONTIN) 300 MG capsule Take 300 mg by mouth 3 (three) times daily.   08/29/2014 at Unknown time  . metoprolol succinate (TOPROL-XL) 50 MG 24 hr tablet Take 50 mg by mouth daily. Take with or immediately following a meal.   08/29/2014 at Unknown time  . omeprazole (PRILOSEC) 20 MG capsule Take 20 mg by  mouth daily.   08/29/2014 at Unknown time  . oxyCODONE (OXY IR/ROXICODONE) 5 MG immediate release tablet Take 5 mg by mouth every 6 (six) hours as needed for severe pain.    08/28/2014 at Unknown time  . predniSONE (DELTASONE) 10 MG tablet 6, 5, 4, 3, 2 then 1 tablet by mouth daily for 6 days total. (Patient taking differently: Take 10 mg by mouth daily. ) 21 tablet 0 08/29/2014 at Unknown time  . PROAIR HFA 108 (90 BASE) MCG/ACT inhaler Inhale 2 puffs into the lungs every 4 (four) hours as needed. Shortness of breath/wheeze   unknown  . spironolactone (ALDACTONE) 25 MG tablet Take 25 mg by mouth 2 (two) times daily.   08/29/2014 at Unknown time  . SYMBICORT 160-4.5 MCG/ACT inhaler Inhale 1 puff into the lungs 2 (two) times daily.   08/29/2014 at Unknown time  . Tiotropium Bromide Monohydrate 2.5 MCG/ACT AERS Inhale 2 puffs into the lungs daily.   08/29/2014 at Unknown time  . traZODone (DESYREL) 150 MG tablet Take 300 mg by mouth at bedtime.    08/28/2014 at Unknown time  . vitamin B-12 (CYANOCOBALAMIN) 500 MCG tablet Take 500 mcg by mouth daily.   08/29/2014 at Unknown time  . XIFAXAN 550 MG TABS tablet Take 550 mg by mouth 2 (two) times daily.   08/29/2014 at Unknown time   Scheduled: . ARIPiprazole  30 mg Oral QHS  . aspirin EC  81 mg Oral Daily  . budesonide-formoterol  1 puff Inhalation BID  . citalopram  40 mg Oral QHS  . dextromethorphan  30 mg Oral Q12H  . enoxaparin (LOVENOX) injection  70 mg Subcutaneous Q24H  . furosemide  40 mg Oral Daily  . gabapentin  300 mg Oral TID  . ipratropium-albuterol  3 mL Nebulization Q6H  . levofloxacin  500 mg Oral q1800  . methylPREDNISolone (SOLU-MEDROL) injection  40 mg Intravenous Q12H  . metoprolol succinate  50 mg Oral Daily  . rifaximin  550 mg Oral BID  . sodium chloride  3 mL Intravenous Q12H  . spironolactone  25 mg Oral BID  . traZODone  300 mg Oral QHS   Continuous:  XHB:ZJIRCV chloride, albuterol, morphine injection, oxyCODONE, sodium  chloride  Assesment: He was admitted with COPD exacerbation and acute hypoxic respiratory failure. He is improving and is approaching baseline. Principal Problem:   Acute respiratory failure with hypoxia Active Problems:   COPD exacerbation  Chest pain   Cirrhosis   Hypertension   CKD (chronic kidney disease)    Plan: Continue current treatments.    LOS: 3 days   Evora Schechter L 09/01/2014, 9:08 AM

## 2014-09-01 NOTE — Progress Notes (Signed)
Subjective: Patien is alert and awake. He is improving. His breathing is better. No chest pain.  Objective: Vital signs in last 24 hours: Temp:  [97.5 F (36.4 C)] 97.5 F (36.4 C) (04/19 0654) Pulse Rate:  [59-193] 60 (04/19 0654) Resp:  [15-20] 20 (04/19 0654) BP: (107-142)/(56-91) 126/76 mmHg (04/19 0654) SpO2:  [88 %-100 %] 94 % (04/19 0739) Weight:  [145.65 kg (321 lb 1.6 oz)] 145.65 kg (321 lb 1.6 oz) (04/19 0654) Weight change: -1.95 kg (-4 lb 4.8 oz) Last BM Date: 08/28/14  Intake/Output from previous day: 04/18 0701 - 04/19 0700 In: 720 [P.O.:720] Out: 2675 [Urine:2675]  PHYSICAL EXAM General appearance: alert, no distress and moderately obese Resp: diminished breath sounds bilaterally, rhonchi bilaterally and wheezes bilaterally Cardio: S1, S2 normal GI: soft, non-tender; bowel sounds normal; no masses,  no organomegaly Extremities: extremities normal, atraumatic, no cyanosis or edema  Lab Results:  Results for orders placed or performed during the hospital encounter of 08/29/14 (from the past 48 hour(s))  Troponin I     Status: None   Collection Time: 08/30/14  1:00 PM  Result Value Ref Range   Troponin I <0.03 <0.031 ng/mL    Comment:        NO INDICATION OF MYOCARDIAL INJURY.   Basic metabolic panel     Status: Abnormal   Collection Time: 09/01/14  5:34 AM  Result Value Ref Range   Sodium 140 135 - 145 mmol/L   Potassium 5.0 3.5 - 5.1 mmol/L   Chloride 102 96 - 112 mmol/L   CO2 31 19 - 32 mmol/L   Glucose, Bld 161 (H) 70 - 99 mg/dL   BUN 36 (H) 6 - 23 mg/dL   Creatinine, Ser 1.35 0.50 - 1.35 mg/dL   Calcium 8.7 8.4 - 10.5 mg/dL   GFR calc non Af Amer 60 (L) >90 mL/min   GFR calc Af Amer 69 (L) >90 mL/min    Comment: (NOTE) The eGFR has been calculated using the CKD EPI equation. This calculation has not been validated in all clinical situations. eGFR's persistently <90 mL/min signify possible Chronic Kidney Disease.    Anion gap 7 5 - 15  CBC      Status: Abnormal   Collection Time: 09/01/14  5:34 AM  Result Value Ref Range   WBC 6.1 4.0 - 10.5 K/uL   RBC 4.43 4.22 - 5.81 MIL/uL   Hemoglobin 14.9 13.0 - 17.0 g/dL   HCT 46.6 39.0 - 52.0 %   MCV 105.2 (H) 78.0 - 100.0 fL   MCH 33.6 26.0 - 34.0 pg   MCHC 32.0 30.0 - 36.0 g/dL   RDW 14.4 11.5 - 15.5 %   Platelets 88 (L) 150 - 400 K/uL    Comment: SPECIMEN CHECKED FOR CLOTS CONSISTENT WITH PREVIOUS RESULT     ABGS  Recent Labs  08/30/14 0001  PHART 7.360  PO2ART 74.6*  TCO2 23.6  HCO3 26.8*   CULTURES Recent Results (from the past 240 hour(s))  MRSA PCR Screening     Status: None   Collection Time: 08/30/14  1:00 AM  Result Value Ref Range Status   MRSA by PCR NEGATIVE NEGATIVE Final    Comment:        The GeneXpert MRSA Assay (FDA approved for NASAL specimens only), is one component of a comprehensive MRSA colonization surveillance program. It is not intended to diagnose MRSA infection nor to guide or monitor treatment for MRSA infections.    Studies/Results: No  results found.  Medications: I have reviewed the patient's current medications.  Assesment: 2  Principal Problem:   Acute respiratory failure with hypoxia Active Problems:   COPD exacerbation   Chest pain   Cirrhosis   Hypertension   CKD (chronic kidney disease)    Plan:  Medications reviewed Will continue nebulizer treatment, IV steroid and antibiotics Pulmonary consult appreciated Will taper iv steroid   LOS: 3 days   Marchelle Rinella 09/01/2014, 8:03 AM

## 2014-09-01 NOTE — Discharge Summary (Signed)
PATIENT NAME:  Jorge [name], Jorge [name] MR#:  [id number] DATE OF BIRTH:  13-Dec-1965  DATE OF ADMISSION:  12/22/2011 DATE OF DISCHARGE:  12/23/2011  PRESENTING COMPLAINT: Chest pain.   DISCHARGE DIAGNOSES:  1. Chest pain, appears noncardiac. Myoview stress test done which shows no acute ischemia other inferior wall fixed scar per Dr. Saralyn Pilar. I do not have an official report at this time. 2. Chronic cirrhosis of liver, alcohol related.  3. Morbid obesity.  4. Chronic obstructive pulmonary disease with tobacco abuse.  5. Anxiety, depression.   CONDITION ON DISCHARGE: Fair. Vitals signs stable.   MEDICATIONS AT DISCHARGE: Patient recommended to continue all his home medications which are:  1. Oxycodone 5 mg 1 to 2 tablets q.4-6 hours as needed.  2. Ventolin HFA 90 mcg/inhalation 2 puffs inhaled every six hours as needed.  3. Symbicort 160/4.5, 2 puffs b.i.d.  4. Vitamin B12 500 mcg 1 p.o. daily.  5. Lasix 40 mg b.i.d.  6. Citalopram 40 mg at bedtime.  7. Trazodone 150 mg 2 tablets at bedtime.  8. Abilify 15 mg daily at bedtime.  9. Lactulose 30 mL b.i.d.  10. Xifaxan 550 mg 1 tablet b.i.d.  11. Nadolol 20 mg daily.  12. Lisinopril 5 mg daily.  13. Protonix 40 mg b.i.d.  14. Cipro 750 mg p.o. once a week on Wednesday.  15. Spironolactone 75 mg daily.  16. Zofran 4 mg as needed.   FOLLOW UP: Follow up with Dr. Salvadore Oxford [facility] in one week.   LABORATORY, DIAGNOSTIC AND RADIOLOGICAL DATA: Creatinine 1.37, BUN 29, glucose 97, sodium 140, potassium 4, chloride 102, bicarbonate 30, cholesterol 112, triglycerides 85, HDL 33, LDL 62, troponin x3 negative. Hemoglobin and hematocrit 14.6 and 43.3, white count 7.9. Chest x-ray: No acute cardiopulmonary disease.   HOSPITAL COURSE: Mr. Pro is a 49 year old gentleman with chronic medical problems well known to our service from previous admissions with history of alcoholic cirrhosis of liver, portal hypertension, esophageal varices,  chronic obstructive pulmonary disease, duodenal ulcers, ascites with multiple paracenteses in the past comes in with:  1. Chest pain. Patient was admitted on the telemetry floor. Patient reported chest pain on and off having for three months. During my evaluation patient did not have any more chest pain. He was ruled out for ACS. No known cardiac history. He remained in sinus rhythm in telemetry. Myoview results per Dr. Saralyn Pilar showed some fixed inferior infarct, however, no acute ischemia was noted.  2. History of alcohol-induced cirrhosis of liver. His home medications were continued. He is doing well.  3. History of anxiety/depression. His psych medications were continued.   4. Chronic obstructive pulmonary disease with ongoing tobacco abuse. Sats were stable. Patient was advised on smoking cessation.   CONDITION ON DISCHARGE: He was discharged to home in a stable condition.  CODE STATUS: Remained a FULL CODE.   TIME SPENT: 40 minutes.  ____________________________ Hart Rochester Posey Pronto, MD sap:cms D: 12/24/2011 06:59:52 ET T: 12/25/2011 11:14:52 ET JOB#: [id number]  cc: Varnell Orvis A. Posey Pronto, MD, <Dictator> [facility], Dr. Willaim Sheng MD ELECTRONICALLY SIGNED 12/25/2011 16:06

## 2014-09-01 NOTE — H&P (Signed)
PATIENT NAME:  Jorge [name], Jorge [name] MR#:  [id number] DATE OF BIRTH:  [birth year]  DATE OF ADMISSION:  12/22/2011  PRIMARY CARE PHYSICIAN: None local.    REFERRING PHYSICIAN: Dr. Cinda Quest.   CHIEF COMPLAINT: Chest pain for 3 to 4 months, worsening today.   HISTORY OF PRESENT ILLNESS: A 49 year old gentleman with multiple medical problems including hypertension, diabetes, chronic obstructive pulmonary disease, sleep apnea and also alcoholic liver cirrhosis who presented to the ED with chest pain for the past 3 to 4 months but worsening today. The patient said he has had chest pain for 3 to 4 months which is substernal, tight, intermittent 2 to 3 times a day but increased to 6 to 8 times today. In addition, chest pain is exacerbated on exertion with some nausea and sweating .  He is supposed to get  a stress test as outpatient but no appointment has been made yet so he came to the ED for further evaluation. His cardiologist is Dr. Nehemiah Massed. Patient denies any fever or chills. No headache or dizziness. No cough, sputum, shortness of breath. No dysuria, hematuria, abdominal pain, nausea, vomiting, or diarrhea.   SOCIAL HISTORY: Smokes 1/2 pack a day for many years. He quit alcohol a year ago. No drug abuse.    PAST SURGICAL HISTORY: Cholecystectomy.  Multiple paracenteses in the past.  PAST MEDICAL HISTORY:  1. Hypertension.  2. Diabetes, diet control. 3. Chronic obstructive pulmonary disease. 4. Obstructive sleep apnea. 5. Alcoholic liver cirrhosis with portal hypertension. 6. Esophageal varices. 7. Ascites with multiple paracenteses in the past. 8. Chronic anemia and thrombocytopenia.   FAMILY HISTORY: Father had heart disease and CABG. Breast cancer in his family.   ALLERGIES: No.   MEDICATIONS: 1. Abilify 15 mg p.o. at bedtime. 2. Cipro 750 mg p.o. once a week on Wednesdays.  3. Escitalopram 1 tablet bedtime.  4. Lasix 40 mg p.o. b.i.d.  5. Lactulose 10 grams 15 mL oral syrup 30 mL b.i.d.   6. Lisinopril 5 mg p.o. daily.  7. Nadolol 20 mg p.o. daily.  8. Zofran 4 mg p.o. q.6 to 8 hours p.r.n.  9. Oxycodone 5 mg p.o. 1 to 2 tablets every 4 to 6 hours p.r.n.  10. Protonix 40 mg p.o. b.i.d.  11. Spironolactone 25 mg p.o. tablets, 3 tablets p.o. once daily.  12. Symbicort 160 mcg/4.5 mcg inhalation 2 tabs b.i.d.  13. Trazodone 150 mg p.o. 2 tablets at bedtime.  14. Ventolin HFA 90 mcg inhalation 2 puffs every 6 hours p.r.n. for shortness of breath.  15. Vitamin B12 at 500 mcg tablets 1 tablet p.o. daily.  16. Xifaxan 550 mg 1 tablet b.i.d.   REVIEW OF SYSTEMS: CONSTITUTIONAL: The patient denies any fever or chills. No headache or dizziness.  No weakness or weight loss. EYES: No double vision, blurred vision.  ENT: No postnasal drip, dysphagia, slurred speech, or epistaxis. No discharge from ear or nose. CARDIOVASCULAR: Positive for chest pain. No palpitations. No orthopnea. No nocturnal dyspnea. No leg edema. RESPIRATORY: No cough, sputum, shortness of breath, or hemoptysis.  GASTROINTESTINAL: No abdominal pain, nausea, vomiting, or diarrhea. No melena or bloody stool. GU: No dysuria, hematuria, or incontinence. SKIN: No rash or jaundice. HEMATOLOGY: No easy bruising or bleeding. ENDOCRINE: No polyuria, polydipsia, heat or cold intolerance. NEUROLOGY: No syncope, loss of consciousness, or seizure. PSYCHIATRIC: No depression or anxiety. No stress.   PHYSICAL EXAMINATION:  VITAL SIGNS: Temperature 98, blood pressure 102/65, pulse 69, respirations 18, O2 saturation 98% in room air.  GENERAL: The patient is alert, awake, oriented, in no acute distress.   HEENT: Pupils round, equal, reactive to light and accommodation. Moist oral mucosa. Clear oropharynx.   NECK: Supple. No JVD or carotid bruits. No lymphadenopathy. No thyromegaly.   CARDIOVASCULAR: S1, S2 regular rate and rhythm. No murmurs, gallops.   PULMONARY: Bilateral air entry. No wheezing or rales. No use of accessory  muscles to breathe.   ABDOMEN: Obese, soft. Bowel sounds present. It is difficult to estimate whether the patient has organomegaly but no tenderness or distention. No rigidity.   EXTREMITIES: No edema, clubbing, or cyanosis. No calf tenderness. Strong bilateral pedal pulses.   SKIN: No rash or jaundice.   NEUROLOGIC: Alert and oriented x3. No focal deficit. Power 5/5. Sensation intact. Deep tendon reflexes mute.   LABORATORY DATA: WBC 7.9, hemoglobin 14.6, platelets 165,000. Glucose 96, BUN 29, creatinine 1.6. Electrolytes are normal. CK 48. CK-MB less than 0.5. Troponin less than 0.02.  Chest x-ray: No acute cardiopulmonary disease. Ultrasound abdomen showed spleen is enlarged at 17.2 cm, gallbladder is surgically absent. EKG showed normal sinus rhythm with 61 beats per minute.   IMPRESSION:  1. Chest pain, rule out acute coronary syndrome.  2. Acute renal failure, dehydration 3. Hypertension.  4. Diabetes.  5. Chronic obstructive pulmonary disease.  6. Obstructive sleep apnea.  7. Alcoholic liver cirrhosis with portal hypertension.  8. Tobacco use.   PLAN OF TREATMENT:  1. The patient will be placed for observation. We will continue telemetry monitor. Follow up a troponin. We will start aspirin 325 mg and nitroglycerin p.r.n.  2. We will check lipid panel, and we will get a stress test if possible on Saturday.  3. For acute renal failure, patient's creatinine increased to 1.6 and BUN actually is lower than before. We will hold Aldactone and follow up BMP.  4. For liver cirrhosis, continue Lasix. Hold Aldactone.  Continue lactulose, xifaxan 5. For hypertension, continue other hypertension medications including lisinopril, Lasix, nadolol.    6. For chronic obstructive pulmonary disease, which is stable, we will continue Symbicort and  Ventolin.  7. For tobacco abuse, smoking cessation was counseled for 8 minutes.   Discussed the patient's situation with the patient and health giver.      TIME SPENT: About 60 minutes.     ____________________________ Demetrios Loll, MD qc:vtd D: 12/22/2011 18:26:40 ET T: 12/22/2011 18:58:36 ET JOB#: [id number]  cc: Demetrios Loll, MD, <Dictator> Demetrios Loll MD ELECTRONICALLY SIGNED 12/22/2011 21:05

## 2014-09-01 NOTE — Clinical Social Work Psychosocial (Signed)
Clinical Social Work Department BRIEF PSYCHOSOCIAL ASSESSMENT 09/01/2014  Patient:  Jorge [name], Jorge [name]     Account Number:  [id number]     Admit date:  08/29/2014  Clinical Social Worker:  Legrand Como  Date/Time:  09/01/2014 02:26 PM  Referred by:  CSW  Date Referred:  09/01/2014 Referred for  ALF Placement   Other Referral:   Interview type:  Patient Other interview type:    PSYCHOSOCIAL DATA Living Status:  FACILITY Admitted from facility:  Other Level of care:  Family Care Home Primary support name:  no one Primary support relationship to patient:   Degree of support available:   Patient reports that he has no supports.    CURRENT CONCERNS Current Concerns  Post-Acute Placement   Other Concerns:    SOCIAL WORK ASSESSMENT / PLAN CSW met with patient who was alert and oriented.  Patient indicated that he has been a resident at [facility] for over four years.  He indicated that he ambulates with a cane.  Patient stated that he is independent in completing his ADL's.  Patient stated that he does not have family support and that no one visits with him in the facility.  Patient stated that he desires to return to [facility] upon discharge.  CSW contact Levie Heritage via cell phone at [phone], however CSW could not leave a message due to now prompt allowing message.   Assessment/plan status:  Information/Referral to [employer] Other assessment/ plan:   Information/referral to community resources:    PATIENT'S/FAMILY'S RESPONSE TO PLAN OF CARE: Patient desires to return to [facility] upon discharge. CSW will continue attempting contact with facility owner to ensure that facility is agreeable to patient's return.    [name], [name]

## 2014-09-02 MED ORDER — LEVOFLOXACIN 500 MG PO TABS: 500.0000 mg | ORAL_TABLET | Freq: Every day | ORAL | Status: DC

## 2014-09-02 MED ORDER — PREDNISONE 10 MG PO TABS: ORAL_TABLET | ORAL | Status: DC

## 2014-09-02 NOTE — Progress Notes (Addendum)
Patient with orders to be discharge back to [facility]. Discharge packet sent with patienet. Patient stable. Patient transported via Psychologist, sport and exercise.Marland Kitchen

## 2014-09-02 NOTE — Clinical Social Work Note (Signed)
CSW facilitated discharge.   CSW spoke with Levie Heritage with [facility].  She confirmed patient's statements. She advised that patient could return to facility upon discharge.  CSW advised that patient was being discharged today and would be ready for pick up between 11:30 and noon.  Mrs. Huel Cote indicated that she would arrange for staff to pick patient up.   CSW signing off.  [name], [place]

## 2014-09-02 NOTE — Progress Notes (Signed)
He says is being discharged so I will plan to sign off at this point.

## 2014-09-02 NOTE — Discharge Planning (Signed)
Physician Discharge Summary  Patient ID: Jorge [name] [facility] MRN: [id number] DOB/AGE: Apr 20, 1966 49 y.o. Primary Care Physician:Yukie Bergeron, MD Admit date: 08/29/2014 Discharge date: 09/02/2014    Discharge Diagnoses:   Principal Problem:   Acute respiratory failure with hypoxia Active Problems:   COPD exacerbation   Chest pain   Cirrhosis   Hypertension   CKD (chronic kidney disease)     Medication List    STOP taking these medications        ciprofloxacin 750 MG tablet  Commonly known as:  CIPRO      TAKE these medications        ARIPiprazole 30 MG tablet  Commonly known as:  ABILIFY  Take 30 mg by mouth at bedtime.     citalopram 40 MG tablet  Commonly known as:  CELEXA  Take 40 mg by mouth at bedtime.     dextromethorphan 30 MG/5ML liquid  Commonly known as:  DELSYM  Take 30 mg by mouth every 12 (twelve) hours.     furosemide 40 MG tablet  Commonly known as:  LASIX  Take 40 mg by mouth daily.     gabapentin 300 MG capsule  Commonly known as:  NEURONTIN  Take 300 mg by mouth 3 (three) times daily.     levofloxacin 500 MG tablet  Commonly known as:  LEVAQUIN  Take 1 tablet (500 mg total) by mouth daily at 6 PM.     metoprolol succinate 50 MG 24 hr tablet  Commonly known as:  TOPROL-XL  Take 50 mg by mouth daily. Take with or immediately following a meal.     omeprazole 20 MG capsule  Commonly known as:  PRILOSEC  Take 20 mg by mouth daily.     oxyCODONE 5 MG immediate release tablet  Commonly known as:  Oxy IR/ROXICODONE  Take 5 mg by mouth every 6 (six) hours as needed for severe pain.     predniSONE 10 MG tablet  Commonly known as:  DELTASONE  40 mg po daily for 3 days, 30 mg po daily for 3 days, 20 mg po daily for 3 days and 10 mg po daily for 3 days then stop     PROAIR HFA 108 (90 BASE) MCG/ACT inhaler  Generic drug:  albuterol  Inhale 2 puffs into the lungs every 4 (four) hours as needed. Shortness of breath/wheeze     spironolactone 25  MG tablet  Commonly known as:  ALDACTONE  Take 25 mg by mouth 2 (two) times daily.     SYMBICORT 160-4.5 MCG/ACT inhaler  Generic drug:  budesonide-formoterol  Inhale 1 puff into the lungs 2 (two) times daily.     Tiotropium Bromide Monohydrate 2.5 MCG/ACT Aers  Inhale 2 puffs into the lungs daily.     traZODone 150 MG tablet  Commonly known as:  DESYREL  Take 300 mg by mouth at bedtime.     vitamin B-12 500 MCG tablet  Commonly known as:  CYANOCOBALAMIN  Take 500 mcg by mouth daily.     XIFAXAN 550 MG Tabs tablet  Generic drug:  rifaximin  Take 550 mg by mouth 2 (two) times daily.        Discharged Condition: improved    Consults: Pulmonary  Significant Diagnostic Studies: Dg Chest Portable 1 View  08/29/2014   CLINICAL DATA:  Generalized chest pain today with shortness of breath history of COPD  EXAM: PORTABLE CHEST - 1 VIEW  COMPARISON:  03/18/2014  FINDINGS: Stable mild to moderate  cardiac enlargement. Vascular pattern normal. Allowing for overlying soft tissue attenuation no abnormal opacities.  IMPRESSION: No active disease.   Electronically Signed   By: Skipper Cliche M.D.   On: 08/29/2014 21:30    Lab Results: Basic Metabolic Panel:  Recent Labs  09/01/14 0534  NA 140  K 5.0  CL 102  CO2 31  GLUCOSE 161*  BUN 36*  CREATININE 1.35  CALCIUM 8.7   Liver Function Tests: No results for input(s): AST, ALT, ALKPHOS, BILITOT, PROT, ALBUMIN in the last 72 hours.   CBC:  Recent Labs  09/01/14 0534  WBC 6.1  HGB 14.9  HCT 46.6  MCV 105.2*  PLT 88*    Recent Results (from the past 240 hour(s))  MRSA PCR Screening     Status: None   Collection Time: 08/30/14  1:00 AM  Result Value Ref Range Status   MRSA by PCR NEGATIVE NEGATIVE Final    Comment:        The GeneXpert MRSA Assay (FDA approved for NASAL specimens only), is one component of a comprehensive MRSA colonization surveillance program. It is not intended to diagnose MRSA infection nor  to guide or monitor treatment for MRSA infections.      Hospital Course:   This is a 49 years old male patient with history of multiple medical illnesses was admitted due to acute respiratory failure due to COPD exacerbation. Patient was started on iv steroid IV antibiotics and nebulizer treatment. He was evaluated pulmonologist. Patient improved and discharged on oral steroid, oral antibiotics and inhaler.  Discharge Exam: Blood pressure 121/72, pulse 64, temperature 97.7 F (36.5 C), temperature source Oral, resp. rate 20, height 6\' 3"  (1.905 m), weight 145.65 kg (321 lb 1.6 oz), SpO2 100 %.    Disposition:  Assisted living.      Signed: Fama Muenchow   09/02/2014, 8:21 AM

## 2014-09-03 NOTE — Care Management Utilization Note (Signed)
UR completed 

## 2014-09-04 NOTE — Consult Note (Signed)
Chief Complaint:  Subjective/Chief Complaint Please see full GI consult and brief consult note.  Patietn seen and examined, chart reviewed.  Recommendations per GI notes.  Will follow.  No plans for luminal evalulation at present while inpatient.   Electronic Signatures: Loistine Simas (MD)  (Signed [phone] 18:04)  Authored: Chief Complaint   Last Updated: 12-Nov-14 18:04 by Loistine Simas (MD)

## 2014-09-04 NOTE — Consult Note (Signed)
PATIENT NAME:  Jorge [name], Jorge [name] MR#:  [id number] DATE OF BIRTH:  [birth year]  DATE OF CONSULTATION:  12/12/2012  REFERRING PHYSICIAN:   CONSULTING PHYSICIAN:  Jeanell Mangan S. Gretel Acre, MD  REASON FOR CONSULTATION: Depression and having suicidal thoughts with a plan to cut his wrist or walk into the traffic, but he came here voluntarily to get help.   HISTORY OF PRESENT ILLNESS: The patient is a 49 year old white male who has long history of severe depression and is currently following with Dr. Jacqualine Code at [facility] and currently living at the [facility] who presented to the ED after he was feeling severe depression and was having panic attacks and crying spells. He reported that he has been having worsening of his depressive symptoms for the past couple of months, and he felt that his medications are not working. He reported that he started becoming suicidal and was having thoughts to harm himself. He reported that he is currently suicidal with a plan to cut his wrist or walk into the traffic. He reported that he feels that he is becoming more hopeless, helpless, having crying spells, anhedonia and lies flat in his bed throughout the day. He is unable to eat well and he is on a controlled diet due to his history of ascites. The patient has feelings of dread and he was unable to contract for safety. The patient reported that his severe knee pain interferes with his ability to engage in activities of daily living. He reported that he follows with Dr. Jacqualine Code at [facility], but he has not seen him since February. He is supposed to have his appointment in the first week of August. The patient reported that his current medications are not helpful and his Abilify was recently increased to 50 mg, but he has not noticed any improvement in his symptoms. The patient remains depressed and actively suicidal during the interview.   PAST PSYCHIATRIC HISTORY: The patient has history of severe depression as well as history of suicide  attempts, at least 5 times in the past. He reported that he has always attempted suicide by trying to overdose on the medication. His last suicide attempt was 2-1/2 years ago. The patient is unable to contract for safety at this time.   FAMILY HISTORY: The patient stated that he has a family history of depression and anxiety.   PAST MEDICAL HISTORY: COPD, cirrhosis of the liver, hypertension.   ALLERGIES: No known drug allergies.   CURRENT HOME MEDICATIONS:  1.  Azithromycin 250 mg once daily. 2.  Albuterol 2.5 mg inhalation solution 3 mL every 6 hours. 3.  Prednisone 20 mg 2 tablets once a day. 4.  Naproxen 250 mg every 6 hours as needed for pain. 5.  Oxycodone 5 mg every 6 hours as needed for pain. 6.  Ventolin HFA 90 mcg 2 puffs inhaled every 6 hours as needed for shortness of breath. 7.  Symbicort inhaler 2 times daily. 8.  Vitamin B12 500 mcg once a day. 9.  Furosemide 40 mg twice a day. 10.  Citalopram 40 mg daily. 11.  Trazodone 150 mg 2 pills at bedtime. 12.  Abilify 15 mg in the morning. 13.  Lactulose 20 grams/3 mL syrup b.i.d.  14.  Xifaxan 550 mg b.i.d.  15.  Nadolol 20 mg once a day. 16.  Lisinopril 5 mg daily. 17.  Pantoprazole 40 mg twice a day. 18.  Cipro 750 mg once a week on Wednesdays. 19.  Zofran disintegrating tablets every 6 to  8 hours as needed for vomiting and abdominal cramping. 20.  Oxycodone 5 mg 1 to 2 tablets every 6 hours as needed for pain. 21.  Spironolactone 25 mg daily.   SOCIAL HISTORY: The patient currently lives at the [facility]. He reported he is currently disabled and is unable to take care of himself.   LABORATORY DATA:  Glucose 103, BUN 30, creatinine 1.64, sodium 138, potassium 4.4, chloride 107, bicarbonate 28, GFR 56, anion gap 3, osmolality 282, calcium 9.0. Blood alcohol level less than 3. Protein 7.6, albumin 3.8, bilirubin 0.6, alkaline phosphatase 109, AST 24, ALT 27. TSH 1.69. Urine drug screen is positive for MDMA. WBC  8.4, RBC 4.04, hemoglobin 14, hematocrit 39.6, platelet count 119, MCV 98, MCH 34.6.   VITAL SIGNS: Temperature 97.1, pulse 89, respirations 20, blood pressure 101/69.   REVIEW OF SYSTEMS: CONSTITUTIONAL: No fever or chills. Denies any weight changes.  EYES: No double or blurred vision.  RESPIRATORY: No shortness of breath or cough.  CARDIOVASCULAR: No chest pain or orthopnea.  GASTROINTESTINAL: No abdominal pain, nausea, vomiting or diarrhea.  GENITOURINARY: No incontinence or frequency.  ENDOCRINE: No heat or cold intolerance.  LYMPHATIC: No anemia or easy bruising.  INTEGUMENTARY: No acne or rash.  MUSCULOSKELETAL: Denies any muscle or joint pain.   MENTAL STATUS EXAMINATION: The patient is a moderately built male with a large beard. He maintained fair eye contact. His speech was low in tone and volume. Mood was depressed. Affect was congruent. Thought process was logical, goal-directed. Thought content was nondelusional. He was unable to contract for safety as he is currently having suicidal ideation with a plan to cut his wrist or to overdose on the medication. He demonstrated poor insight and judgment.   DIAGNOSTIC IMPRESSION: AXIS I: Major depressive disorder, recurrent, severe, without psychotic features.  AXIS II: None.  AXIS III: Please review the medical history.   TREATMENT PLAN: The patient is currently under involuntary commitment and will be placed in a psychiatric facility once the bed becomes available.   He will continue on his medication as follows:  1.  Citalopram 40 mg p.o. daily.  2.  Abilify 15 mg in the morning.  3.  I will add Wellbutrin 75 mg as and add-on to his antidepressant medications.  4.  He will continue on trazodone 300 mg p.o. at bedtime.  We will continue to monitor the patient closely for worsening of his depressive symptoms and suicidal ideations. Thank you for allowing me to participate in the care of this patient.   ____________________________ Cordelia Pen. Gretel Acre, MD usf:sb D: 12/12/2012 12:48:18 ET T: 12/12/2012 13:13:18 ET JOB#: [id number]  cc: Cordelia Pen. Gretel Acre, MD, <Dictator> Jeronimo Norma MD ELECTRONICALLY SIGNED 12/17/2012 12:36

## 2014-09-04 NOTE — Consult Note (Signed)
Chief Complaint:  Subjective/Chief Complaint seen for rectal bleeding and h/o cirrhosis.  stable, denies abdominalpain or nausea, no further rectal bleeding.   VITAL SIGNS/ANCILLARY NOTES: **Vital Signs.:   13-Nov-14 05:01  Vital Signs Type Routine  Temperature Temperature (F) 97.8  Celsius 36.5  Temperature Source oral  Respirations Respirations 18  Systolic BP Systolic BP 629  Diastolic BP (mmHg) Diastolic BP (mmHg) 78  Mean BP 100  Pulse Ox % Pulse Ox % 92  Pulse Ox Activity Level  At rest  Oxygen Delivery 4L    11:02  Pulse Ox % Pulse Ox % 95  Pulse Ox Activity Level  At rest  Oxygen Delivery Room Air/ 21 %   Brief Assessment:  Cardiac Regular   Respiratory clear BS   Gastrointestinal details normal Soft  Nontender  Nondistended  Bowel sounds normal  obese   Lab Results: Routine Chem:  13-Nov-14 04:46   Glucose, Serum  144  BUN  33  Creatinine (comp) 1.21  Sodium, Serum 140  Potassium, Serum 4.8  Chloride, Serum  111  CO2, Serum 24  Calcium (Total), Serum  8.1  Anion Gap  5  Osmolality (calc) 289  eGFR (African American) >60  eGFR (Non-African American) >60 (eGFR values <90m/min/1.73 m2 may be an indication of chronic kidney disease (CKD). Calculated eGFR is useful in patients with stable renal function. The eGFR calculation will not be reliable in acutely ill patients when serum creatinine is changing rapidly. It is not useful in  patients on dialysis. The eGFR calculation may not be applicable to patients at the low and high extremes of body sizes, pregnant women, and vegetarians.)  Magnesium, Serum 2.0 (1.8-2.4 THERAPEUTIC RANGE: 4-7 mg/dL TOXIC: > 10 mg/dL  -----------------------)  Routine Hem:  13-Nov-14 04:46   WBC (CBC) 6.8  RBC (CBC)  3.59  Hemoglobin (CBC)  12.8  Hematocrit (CBC)  36.2  Platelet Count (CBC)  82  MCV  101  MCH  35.6  MCHC 35.4  RDW 13.5  Neutrophil % 91.5  Lymphocyte % 5.2  Monocyte % 3.2  Eosinophil % 0.0   Basophil % 0.1  Neutrophil # 6.2  Lymphocyte #  0.4  Monocyte # 0.2  Eosinophil # 0.0  Basophil # 0.0 (Result(s) reported on 27 Mar 2013 at 05:34AM.)   Radiology Results: CT:    11-Nov-14 17:34, CT Angiography Chest/Abd/Pelvis w/wo  CT Angiography Chest/Abd/Pelvis w/wo   REASON FOR EXAM:    sudden onset abd pain eval dissection do not wait for   labs  COMMENTS:       PROCEDURE: CT  - CT ANGIOGRAPHY CHEST/ABD/PELVIS  - Mar 25 2013  5:34PM     CLINICAL DATA:  Generalize weakness and sudden onset abdominal pain.  Evaluate for dissection.    EXAM:  CT ANGIOGRAPHY CHEST, ABDOMEN AND PELVIS    TECHNIQUE:  Multidetector CT imaging through the chest, abdomen and pelvis was  performed using the standard protocol during bolus administration of  intravenous contrast. Multiplanar reconstructed images including  MIPs were obtained and reviewed to evaluate the vascular anatomy.    CONTRAST:  125 mL Isovue 370 IV    COMPARISON:  06/10/2010 and 01/29/2007    FINDINGS:  CTA CHEST FINDINGS    The lungs are adequately inflated and demonstrate a few small  pulmonary nodules bilaterally right worse than left with the largest  measuring approximately 5 mm over the right lower lobe as these are  not significantly changed although this largest nodule in  the right  lower lobe maybe slightly more solid in its appearance. No focal  consolidation or effusion. Heart is normal in size. There is minimal  calcification over the left anterior descending coronary artery. The  pulmonary arterial system is well opacified without definite emboli  seen. Thoracic aorta is normal in caliber without evidence of  dissection or aneurysm. Remaining mediastinal structures are  unremarkable.    Review of the MIP images confirms the above findings.    CTA ABDOMEN AND PELVIS FINDINGS    The abdominal aorta is normal in caliber without evidence of  aneurysm or dissection. There is minimal calcified plaque over  the  distal abdominal aorta and iliac vessels. There is a common takeoff  of the celiac axis and superior mesenteric artery which are patent.  Inferior mesenteric arteries patent. Single bilateral renal arteries  are patent.    There has been a prior cholecystectomy. The spleen is mildly  enlarged measuring 15 cm in diameter and unchanged. There is mild  nodular contour to the liver unchanged. Recanalization of the  umbilical vein. Pancreas and adrenal glands are within normal.  Kidneys and ureters are within normal. Appendix is normal.    Remaining pelvic images are unremarkable. There is mild degenerative  change of the spine.    Review of theMIP images confirms the above findings.     IMPRESSION:  No evidence of dissection or aneurysm involving the thoracoabdominal  aorta. No acute findings in the chest, abdomen or pelvis.    A few small bilateral pulmonary nodules thelargest measuring 5 mm  over the right lower lobe. These are essentially stable except for  the 5 mm nodule in the right lower lobe which is more solid. If the  patient is at high risk for bronchogenic carcinoma, follow-up chest  CT at 6-12 months isrecommended. If the patient is at low risk for  bronchogenic carcinoma, follow-up chest CT at 12 months is  recommended. This recommendation follows the consensus statement:  Guidelines for Management of Small Pulmonary Nodules Detected on CT  Scans:A Statement from the [employer] as published in  Radiology 2005;237:395-400.    Evidence of cirrhosis with mild splenomegaly.  Mild atherosclerotic coronary artery disease.      Electronically Signed    By: Marin Olp M.D.    On: 03/25/2013 17:51         Verified By: Pearletha Alfred, M.D.,   Assessment/Plan:  Assessment/Plan:  Assessment 1) cirrhosis-etoh related, no etoh for four years per patient.  stable, continue current management, note off nadolol due to low bp.  2) rectal bleeding likely anal  outlet, hemorrhoidal or fissure. Associated with bm,  not recurrent.   Plan 1) continue current, will need fu in GI in about 2 weeks. finish 10 day course of treatment for anal bleeding. will sign off.   Electronic Signatures: Loistine Simas (MD)  (Signed [phone] 11:31)  Authored: Chief Complaint, VITAL SIGNS/ANCILLARY NOTES, Brief Assessment, Lab Results, Radiology Results, Assessment/Plan   Last Updated: 13-Nov-14 11:31 by Loistine Simas (MD)

## 2014-09-04 NOTE — Discharge Summary (Signed)
PATIENT NAME:  Jorge [name], Jorge [name] MR#:  [id number] DATE OF BIRTH:  04-27-1966  DATE OF ADMISSION:  03/25/2013 DATE OF DISCHARGE:  03/27/2013  PRIMARY CARE PHYSICIAN:  Nonlocal.    CONSULTING PHYSICIAN:  Dr. Gustavo Lah.  DISCHARGE DIAGNOSES:  1.  Hypotension, possible due to acute renal failure, dehydration.  2.  Acute respiratory failure secondary to chronic obstructive pulmonary disease exacerbation.  3.  Acute renal failure, chronic kidney disease.  4.  Cirrhosis.  5.  Tobacco abuse.   CONDITION:  Stable.   CODE STATUS:  FULL CODE.   MEDICATIONS:  Please refer to the [facility] discharge instruction medication reconciliation list.   DIET:  Low sodium, low fat, low cholesterol diet.   ACTIVITY:  As tolerated.   FOLLOW-UP CARE:  Followup PCP within 1 to 2 weeks.  Follow up with Dr. Gustavo Lah within 1 to 2 weeks.  We will hold Aldactone, lisinopril and decrease Lasix to 40 mg daily and continue nadolol due to hypotension and acute renal failure.  Need to hold the Lasix and nadolol if the patient's blood pressure systolic less than 784 or diastolic is less than 60 and also the patient needs smoking cessation.   REASON FOR ADMISSION:  Dizziness, pain all over.   HOSPITAL COURSE:  The patient is a 49 year old male with a history of alcoholic cirrhosis, portal hypertension, esophageal varices comes to the ED for dizziness and pain all over.  The patient has low blood pressure in the ED at 72/49 with a heart rate at 118.  He was treated with a couple of liters of IV fluids and systolic blood pressure increased to over 100.  CAT scan of the abdomen and pelvis and CT angio of chest did not show any significant pneumonia.  For a detailed history and physical examination, please refer to the admission note dictated by Dr. Bridgette Habermann.    Laboratory data on admission date shows ABG with pH of 7.31, pCO2 49, pO2 of 74.  WBC 11.6, hemoglobin 13.6, platelets 123.  Troponin negative.  BUN 33, creatinine 2.45,  potassium 4.4, sodium 137.    The patient was admitted for hypotension and possible underlying infection, possible adrenal insufficiency and also SIRS and sepsis according to Dr. Bridgette Habermann, but after admission the patient was treated with Zosyn.  Blood culture so far is negative.  The patient had leukocytosis improved and WBC decreased to 6.2.  The patient has no fever or chills.  No signs of sepsis or SIRS so Zosyn was discontinued.  The second day after admission the patient has been treated with IV fluid support, Lasix, Aldactone, lisinopril.  Nadolol was discontinued due to hypotension and acute renal failure.  Hypotension is possibly due to dehydration, acute renal failure.  The patient's hypotension has improved after IV fluid support.   Cirrhosis with suspicion of ascites.  The patient was planned to get a paracentesis, however according to radiology no ascites could be drawn.   Acute respiratory failure with COPD exacerbation.  The patient has been treated with IV Solu-Medrol, nebulizer.  The patient's symptoms have improved.   Acute renal failure on CKD secondary to hypotension which is possibly due to hypertension medication and hypotension so hypertension medication was discontinued.  The patient has been treated with IV fluid support and renal function has improved.    GI bleeding, possible from rectal outlet bleeding.  According to Dr. Gustavo Lah,  Anusol suppository twice daily for 14 days and follow up as outpatient for colonoscopy.   The patient's  hemoglobin is stable.   The patient has no complaints today.  The patient's vital signs are stable.  He is clinically stable, will be discharged to a group home today.  I discussed the patient's discharge plan with the patient, case manager and nurse.   TIME SPENT:  About 39 minutes.   ____________________________ Demetrios Loll, MD qc:ea D: 03/27/2013 15:40:00 ET T: 03/28/2013 02:16:06 ET JOB#: [id number]  cc: Demetrios Loll, MD, <Dictator> Demetrios Loll MD ELECTRONICALLY SIGNED 03/28/2013 15:39

## 2014-09-04 NOTE — Consult Note (Signed)
Brief Consult Note: Diagnosis: Generalized pain, hypotension, acute respiratory failure.   Patient was seen by consultant.   Consult note dictated.   Comments: Appreciate consult for 49 y/o caucasian man admitted for generalized pain, acute resp failure, hypotension. History significant for COPD, cirrhosis relating to ALD, depression w/several admissions for SI, left knee arthritis, sleep apnea.  Has been followed reg in the GI clinic at [facility] for his cirrhosis, last seen mid Sep: soon after that it was reported that he checked out of the group home he was living in, to live with a male friend he met at a recent psyh hosp in [place]. Care home reported he left his meds there. Pt states adherence tothem. His liver meds have incl Cipro $RemoveBe'750mg'SKiNVolWY$  po qw, Nadolol $RemoveBef'20mg'JGvvpvvsEy$  po qpm, Pantoprazole $RemoveBeforeDEI'40mg'nIwLKbGwWACrQMjU$  po bid. He was on aldactone, but this was held as his renal function was deteriorating and he was referred to nephrologist, who he saw and work up was planned. However, due to his move did not occur. States hes next renal appt is next Monday.  Lasix had been dc'd prior as his peripheral edema had improved. Has a long history of nonadherence to lactulose therapy. We tried Xiafaxan at one time, but patient developed diarrhea with this and would no longer take it. States adherence to 2gmNa diet.   States that he had pain all day yesterday and was feeling badly all over. No known sick contacts, no specific sites of pain. Associated with nausea but no vomiting. Had some loose stools yesterday, last with a fair amt brpr. No stool today.  Feeling better, wants to eat, nausea persists a little. Controlled on antiemetics.  Paracentesis was attempted but no ascites. Had CTA of chest/abd/pelv  w/  pul nodules, cirrhotic liver, enl spleen, revasc of umb vein,  some atherosclerosis, but no other abns. Abd nd/nt. Rectal exam with brown stool- there was a tiny amt congealed red material. Hgb stable. Platelets 82, Pt/inr ok, NH4 47,   Impression and plan: Likely anal outlet bleeding: anusol supp bid x14. Plan for cspy for polyp hx as op                                Cirrhosis: stable.                                Gen pain/N/D: ? viral syndrome- better.  Electronic Signatures: Stephens November H (NP)  (Signed [phone] 17:48)  Authored: Brief Consult Note   Last Updated: 12-Nov-14 17:48 by Theodore Demark (NP)

## 2014-09-04 NOTE — H&P (Signed)
PATIENT NAME:  Jorge [name], HAACK MR#:  [id number] DATE OF BIRTH:  10-14-1965  DATE OF ADMISSION:  03/25/2013  REFERRING PHYSICIAN: Yetta Numbers. Karma Greaser, MD  PRIMARY CARE PHYSICIAN: Dr. Legrand Rams.    CHIEF COMPLAINT: Dizziness and pain all over.   HISTORY OF PRESENT ILLNESS: The patient is a 49 year old, obese male a history of alcoholic cirrhosis and portal hypertension, history of esophageal varices, who comes in for the above chief complaint. The patient stated that overall he has been in good health. He started to have some dizziness, lightheadedness today. He felt cold without having any fevers or chills. Ambulance was called. He had abdominal pain, back pain and thigh pains. Per ER staff, initially they thought the blood pressure was high in the 230 range. He was brought in here. He was found actually to have low blood pressure. He had apparently not received any medications by EMS per ER staff. Here, he was noted to be tachycardic, rate of 118 and blood pressure of 72/49. He was given a couple of liters of IV fluid and the last blood pressure while I was in the room was systolic a little over 347. He was noted to have slight leukocytosis but no fever. Of note, he underwent a CT angio of chest, abdomen and pelvis which did not show any significant pneumonia. Hospitalist services were contacted for further evaluation and management. He also has ago worsening renal failure.   PAST MEDICAL HISTORY:  1.  History of alcoholic liver cirrhosis with portal hypertension.  2.  Esophageal varices with duodenal ulcers in the past.  3.  History of ascites with multiple large paracenteses in the past, last one was last year per patient.  4.  History of COPD.   5.  Obstructive sleep apnea, on CPAP.  6.  Diet-controlled diabetes.  7.  Hypertension.  8.  History of chronic anemia, thrombocytopenia and portal hypertension.  9.  History of anxiety and depression.  10.  History of suicide attempt and several admissions  to Behavior[facility].   PAST SURGICAL HISTORY: History of multiple paracenteses in the past and cholecystectomy.   ALLERGIES: Denies.   OUTPATIENT MEDICATIONS: Abilify 30 mg daily, Celexa 40 mg daily, Cipro 750 mg once a week, gabapentin 300 mg 3 times a day, Lasix 40 mg 2 times a day, lisinopril 5 mg daily, nadolol 20 mg daily, Protonix 40 mg 2 times a day, prednisone 10 mg daily, spironolactone 25 mg 2 times a day, trazodone 300 mg at bedtime, vitamin B12 500 mcg daily, Xifaxan 550 mg 2 times a day. He also takes the albuterol p.r.n.   SOCIAL HISTORY: Still smokes 1/2 pack a day. No alcohol currently, last he states drank about 4 years ago. Denies drug use.   FAMILY HISTORY: Heart disease and breast cancer.   REVIEW OF SYSTEMS: CONSTITUTIONAL: No fever but has had cold sweats. No weight changes. He says his weight is steady but he has pain in his back and abdomen and legs.  EYES: No blurry vision or double vision.  ENT: No tinnitus, hearing loss.  RESPIRATORY: Positive for wheezing, no cough, some shortness of breath. No painful respirations.  CARDIOVASCULAR: Denies chest pain or swelling in the legs. Has swelling in the legs at times, however. No history of CHF.   GASTROINTESTINAL: Had some nausea earlier but this resolved. Has upper epigastric abdominal pain. No radiation.  GENITOURINARY: Denies dysuria or hematuria.  HEMATOLOGY OR LYMPHATIC:  Denies anemia or easy bruising.  SKIN: Denies any  rashes.  MUSCULOSKELETAL: Has no gout, has some back pains.  NEUROLOGIC: Denies focal weakness, numbness, stroke or seizures.  PSYCHIATRIC: Has history of anxiety and depression.   PHYSICAL EXAMINATION: VITAL SIGNS: While in the ER, his temperature was 97.7, pulse initially was 118, respiratory rate 24, blood pressure 72/49, last blood pressure is 109/55. O2 sat 98% on oxygen. He did have oxygen saturation of 81% on room air at one time a few hours ago.  GENERAL: The patient is a morbidly obese  Caucasian male lying in bed, no obvious distress.  HEENT: Normocephalic, atraumatic. Pupils are equal and reactive. Anicteric sclerae. Extraocular muscles intact. Moist mucous membranes.  NECK: Supple. No thyroid tenderness. No cervical lymphadenopathy.  CARDIOVASCULAR: S1, S2, irregularly irregular. No significant murmurs, rubs or gallops.  LUNGS: Decreased breath sounds bilaterally, mild wheezing.  ABDOMEN: Soft, no peritoneal sign, mild upper epigastric tenderness to palpation. No rebound or guarding. Positive bowel sounds in all quadrants, obese. No organomegaly appreciated.  EXTREMITIES: Some chronic venous stasis changes but no pitting edema.  SKIN: No obvious rashes otherwise.  NEUROLOGIC: Cranial nerves II through XII grossly intact. Strength is 5 out of 5 in all extremities. Sensation is intact to light touch.  PSYCHIATRIC: Awake, alert and oriented x 3; pleasant, cooperative.   LABORATORY, DIAGNOSTIC AND RADIOLOGICAL DATA: CT angio chest, abdomen and pelvis showed no dissection, abdominal aorta is normal caliber or aneurysm. No acute findings of the chest, abdomen or pelvis. There is evidence of cirrhosis with mild splenomegaly. X-ray of the chest, no active disease of the chest. ABG shows pH of 7.31, pCO2 of 49, pO2 of 74. Serum salicylate is mildly elevated at 4.6, serum acetaminophen normal. UA not suggestive of infection. INR is 1.1. White count of 11.6, hemoglobin 13.6, platelets are 123. Troponin negative. LFTs within normal limits. BUN 33, creatinine 2.45, sodium 137, potassium 4.4. EKG sinus brady, otherwise normal, no acute ST elevations or depressions.   ASSESSMENT AND PLAN: We have a 49 year old male with a history of chronic obstructive pulmonary disease, cirrhosis secondary to alcohol, portal hypertension, esophageal varices who comes in with hypotension. The patient also appears to have acute respiratory failure with oxygen saturations of 80s. He appears to be having decreased  breath sounds and is wheezy. In regards to hypotension, this could be secondary to an infectious process. He has mild leukocytosis as well. Chief in the diagnosis would be something like an subacute bacterial peritonitis and chronic obstructive pulmonary disease exacerbation with possible acute bronchitis. Adrenal insufficiency is also a possibility in the setting of infection, stressors and the patient being on prednisone. When asked if he is on prednisone, he states that he did not know that he was on prednisone but he states that he takes all his medications because the group home staff gives it to him. We will go head and get the blood cultures, start Zosyn and a paracentesis has been ordered for tomorrow to include cell count and cultures. The patient, however, has no peritoneal signs. There is some epigastric tenderness to palpation without rebound or guarding. He has no fever or altered mental status. Would obtain a gastroenterology consult and check a cortisol level in the a.m. to evaluate for adrenal insufficiency but would start him on steroids and nebulizers because of his acute chronic obstructive pulmonary disease flare and possible bronchitis. He does appear to have acute on chronic renal failure with elevation of his creatinine than his baseline. This could be triggered by the hypotension he has been  having. Would hold nephrotoxin. He has received a couple of liters of fluid. We would start him on some standing fluids, hold ACE inhibitor and Lasix and the spironolactone as he does not appear to be volume overloaded whatsoever. His abdomen is pretty obese and difficult to assess for ascites, however, but he states that his belly is about the same as prior. He denies is taking any excessive medications or any suicidal intent or depression. We would continue his Xifaxan and Abilify and Celexa. Would start him on heparin for deep vein thrombosis prophylaxis. Would follow with the cultures as well.    TOTAL TIME SPENT: 50 minutes.   CODE STATUS: The patient is full code.     ____________________________ Vivien Presto, MD sa:cs D: 03/25/2013 19:55:00 ET T: 03/25/2013 20:15:59 ET JOB#: [id number]  cc: Vivien Presto, MD, <Dictator> Vivien Presto MD ELECTRONICALLY SIGNED 04/03/2013 7:06

## 2014-09-04 NOTE — Consult Note (Signed)
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 Has been followed regularly in the GI clinic at [facility] for his cirrhosis, last seen in September. Soon after that, it was reported that he checked himself out of the group home he was living in to go live with a male friend he met at a recent psychiatric hospitalization. They were to live in [place]. States he has returned back to the care home in the last 3 weeks. Care home reported at the time he had left all of his medications there. The patient states he has been adherent to them. His liver meds have included Cipro 750 mg p.o. q.8, nadolol 20 mg p.o. q.p.m., pantoprazole 40 mg p.o. b.i.d. He was on Aldactone at one time, but this was held as his renal function was deteriorating and he was referred to nephrology, who he did see and work-up was planned. However, due to his move, this did not occur. States today that he does have renal followup on Monday. Lasix was discontinued prior to this, as his peripheral edema had improved. Does have a long history of nonadherence to lactulose therapy. We tried Xifaxan at one time, but patient had reported increasing diarrhea with this and would no longer take it. Does state he is adherent to a 2 gram sodium diet. The patient reports that he had pain all day yesterday and feeling badly all over. No known sick contacts. No specific type of pain. Associated with nausea but no vomiting. Had some loose stools yesterday, last with a fair amount of BRPR, no stool today. Feeling better, wants to eat. Says the nausea somewhat persists,  but it is better with antiemetics. Paracentesis was attempted this visit, but there was no ascites to draw. Had CT of chest, abdomen and pelvis with pulmonary nodules, cirrhotic liver, enlarged spleen, revascularization with umbilical vein, some atherosclerosis but no other abnormalities. His liver enzymes are stable. Hemoglobin stable. Platelets are 82. PT and INR are okay. His ammonia is 47. He is overdue for colonoscopy due to history of adenomatous polyps.   PAST MEDICAL HISTORY: Alcoholic liver cirrhosis, portal hypertension, esophageal varices with duodenal ulcers in the past, history of ascites. Has had multiple large volume paracenteses in the past, last was over a year ago. COPD, sleep apnea on CPAP, diet-controlled diabetes, hypertension, anxiety, depression, suicide attempt, was hospitalized for suicidal ideation about 6 months ago.   ALLERGIES: Denies.  MEDICATIONS: Current primary physician listing of medications: Abilify 30 mg p.o. daily, Celexa 40 p.o. daily, Cipro 750 p.o. q. week, gabapentin 300 t.i.d., Lasix 40 b.i.d., lisinopril 5 daily, nadolol 20 daily, Protonix 40 b.i.d., prednisone 10 daily, spironolactone 25 mg b.i.d., trazodone 300 mg p.o. at bedtime, B12 at 500 mcg daily, Xifaxan 550 b.i.d., albuterol inhaler p.r.n.   SOCIAL HISTORY: Smokes 1/2 pack a day currently. No illicits. Last EtOH 4 years ago.   FAMILY HISTORY: Significant for heart disease and breast cancer.   REVIEW OF SYSTEMS:  Ten systems reviewed, unremarkable other than what is written above other than sometimes he feels cold. No weight loss. Does have chronic knee pain. No other complaints presently.   LABORATORY DATA: Most recent labs:  Glucose 146, BUN 46, creatinine 1.71, sodium 141, potassium 4.7, chloride 109, GFR 46, calcium 7.6. Ammonia 457. A1c 5.0. EtOH less than 0.003. Total protein 6.2, albumin 3.0, total bilirubin 0.4, ALP 85, AST 15, ALT 32. Troponin negative, CK negative. TSH 0.498. Urine drug  screen negative. WBC 6.1, hemoglobin 12.7, hematocrit 35.8, platelets 82. Red cells are macrocytic. PT 14.3, INR 1.1. Blood cultures x 2: No growth since yesterday. Urine without signs of infection. Acetaminophen level less than 2. Salicylate level was 4.6.   PHYSICAL EXAMINATION: VITAL SIGNS: Most recent temperature 97.6, pulse 59, respiratory rate 20, blood pressure 92/62, SaO2 of 95%.  GENERAL: Pleasant, chronically ill but well-appearing obese man resting in bed in no acute distress, appears comfortable.  HEENT: Normocephalic and atraumatic. Sclerae anicteric.  NECK: Supple. No thyromegaly, JVD.  CHEST: Respirations eupneic. Faint bilateral lower lobe expiratory wheezes.  CARDIAC: S1, S2, RRR. No MRG. Do note trace lower extremity edema.  ABDOMEN: Obese abdomen. Bowel sounds x 4. Soft. No appreciable tenderness, distention, hepatosplenomegaly, masses or other abnormalities.  RECTAL: No external abnormalities. Stool brown. Tiny bit of congealed red material on finger.  SKIN: Warm, dry, pink. No erythema, lesion or rash. The lower legs are somewhat discolored. This is consistent with his history of venous stasis ulcers, which was quite some time ago. NEUROLOGIC: Alert, oriented x 3. Minimal asterixis. Speech clear. No facial droop. Cranial nerves II through XII intact.  PSYCHIATRIC: Pleasant, calm, cooperative. Limited insight and judgment.   IMPRESSION AND PLAN:  1.  Rectal bleeding: Likely anal outlet. Anusol suppository b.i.d. for 14 days. Will plan for colonoscopy for polyp history as outpatient.  2.  Cirrhosis: This appears stable. Do agree with holding the nadolol due to his hypotension. Will need to restart as soon as pressure tolerates. Do recommend continuing Cipro 750 p.o. q. week for SBP prophylaxis. Pantoprazole 40 mg b.i.d. Would hold diuretics for results of renal evaluation, especially as there is no ascites and minimal lower extremity edema. Do note elevated salicylate level. I  discussed this with the patient, who denies using aspirin or other NSAIDs. This may be so. I did reiterate with him, however, that NSAIDs were associated with deleterious effects with patients with cirrhosis, including hepatorenal syndrome, probable platelet aggregation and increased acid production, thinning of the mucus lining leading to possible GI bleeding. Would recommend continuing with some ammonia-lowering agent. If he will take the Xifaxan, that is good. The patient has been nonadherent to lactulose therapy in the past and at one time had declined Xifaxan.  3.  General pain, nausea, diarrhea: Possibly this may have been related to a viral syndrome. He is better presently. Will continue to follow. Do recommend continuing the pantoprazole drip for another 1 or 2 days, then b.i.d. He can have 2 gram sodium diet.   Thank you very much for this consult.   These services were provided by Stephens November, MSN, [facility], in collaboration with Lollie Sails, MD, with whom I have discussed this patient in full.   ____________________________ Theodore Demark, NP chl:jcm D: 03/26/2013 17:59:07 ET T: 03/26/2013 18:39:14 ET JOB#: [id number]  cc: Theodore Demark, NP, <Dictator> [place] SIGNED 04/21/2013 13:05

## 2014-09-06 NOTE — H&P (Signed)
PATIENT NAME:  Jorge [name], Jorge [name] MR#:  [id number] DATE OF BIRTH:  Oct 09, 1965  DATE OF ADMISSION:  05/18/2011  ADDENDUM:  ABG shows pH 7.25, pCO2 87, pO2 47 on 21 on room air. Most likely he has hypercapnia leading him to be drowsy. Also maybe hepatic encephalopathy. We will start him on BiPAP stat. I also added Solu-Medrol to the regimen. He is also started on Duo-Nebs. We will repeat ABG two hours later to see his improvement on BiPAP. The patient also has acute on chronic hypercapnic respiratory failure.    ____________________________ Mena Pauls, MD ag:bjt D: 05/18/2011 22:41:05 ET T: 05/19/2011 08:41:40 ET JOB#: [id number]  cc: Mena Pauls, MD, <Dictator> [place] MD ELECTRONICALLY SIGNED 06/09/2011 10:29

## 2014-09-06 NOTE — Consult Note (Signed)
Chief Complaint:   Subjective/Chief Complaint Overall same.   VITAL SIGNS/ANCILLARY NOTES: **Vital Signs.:   05-Jan-13 12:00   Pulse Pulse 48   Respirations Respirations 13   Systolic BP Systolic BP 354   Diastolic BP (mmHg) Diastolic BP (mmHg) 69   Mean BP 87   Pulse Ox % Pulse Ox % 95   Pulse Ox Heart Rate 48   Brief Assessment:   Additional Physical Exam Intubated and sedated. Abdomen is soft and not tense.   Routine Hem:  05-Jan-13 04:20    WBC (CBC) 6.4   RBC (CBC) 4.14   Hemoglobin (CBC) 13.3   Hematocrit (CBC) 39.8   Platelet Count (CBC) 121   MCV 96   MCH 32.0   MCHC 33.3   RDW 14.6  Routine Chem:  05-Jan-13 04:20    Glucose, Serum 209   BUN 19   Creatinine (comp) 1.24   Sodium, Serum 139   Potassium, Serum 4.1   Chloride, Serum 97   CO2, Serum 33   Calcium (Total), Serum 8.6   Osmolality (calc) 286   eGFR (African American) >60   eGFR (Non-African American) >60   Anion Gap 9   Ammonia, Plasma 37  Routine Hem:  05-Jan-13 04:20    Neutrophil % 91.7   Lymphocyte % 6.4   Monocyte % 1.9   Eosinophil % 0.0   Basophil % 0.0   Neutrophil # 5.9   Lymphocyte # 0.4   Monocyte # 0.1   Eosinophil # 0.0   Basophil # 0.0  Routine Chem:  05-Jan-13 04:20    Magnesium, Serum 2.1  Lab:  05-Jan-13 09:00    pH (ABG) 7.44   PCO2 49   PO2 100   FiO2 50   Base Excess 7.7   HCO3 33.3   O2 Saturation 98.0   Patient Temp (ABG) 37.0   Vt 600   PEEP 5.0   Mechanical Rate 14   Assessment/Plan:  Assessment/Plan:   Assessment Ascites, much better. Encephalopathy. Hard to asses but ammonia is almost normal.    Plan Continue current management. Will follow.   Electronic Signatures: Jill Side (MD)  (Signed 05-Jan-13 13:39)  Authored: Chief Complaint, VITAL SIGNS/ANCILLARY NOTES, Brief Assessment, Lab Results, Assessment/Plan   Last Updated: 05-Jan-13 13:39 by Jill Side (MD)

## 2014-09-06 NOTE — H&P (Signed)
PATIENT NAME:  Jorge [name], Jorge [name] MR#:  [id number] DATE OF BIRTH:  Jun 17, 1965  DATE OF ADMISSION:  05/18/2011  PRIMARY CARE PHYSICIAN: [facility] GI: Dr. Gustavo Lah   CHIEF COMPLAINT:  "My ascites is  increasing."   HISTORY OF PRESENT ILLNESS: 49 year old male with history of alcoholic liver cirrhosis with portal hypertension,  history of esophageal varices, history of anemia and thrombocytopenia, and also chronic obstructive pulmonary disease. He is supposed to be on oxygen, he says as needed. He has history of multiple paracenteses in the past. Also history of anxiety and depression. He was last admitted in September 2012. He had paracentesis at that time. That was his last paracentesis.  He is on Lasix and Aldactone at home. He says for the last 1-1/2 weeks his abdominal swelling or ascites is increasing and he has been complaining of abdominal pain for about a week now.  He is complaining of some nausea but no vomiting. No hematemesis. No melena.  He thinks he is not urinating that much with his diuretics. He said he has been compliant with lactulose and Xifaxan but he has been sleepy and drowsy. He is also complaining of weight gain. He is complaining of some shortness of breath because of ascites and abdominal pain. Hospitalist was asked to admit the patient because of worsening ascites, ongoing cirrhosis, and possible SBP with abdominal pain. He was ordered IV ciprofloxacin but I will change it to cefotaxime because he takes ciprofloxacin for prophylaxis.   PAST MEDICAL HISTORY:  1. Alcoholic liver cirrhosis with portal hypertension. 2. History of esophageal varices.  He had an EGD in January which showed esophageal varices and duodenal ulcer. 3. Ascites with multiple paracenteses in the past. His last paracentesis that was successful was in September of 2012 when they drained about 9 liters of fluid. 4. Chronic obstructive pulmonary disease. He uses oxygen at needed. 5. History of  anxiety, depression, and previous history of suicide attempt. He was admitted to [place] in May 2012. 6. History of obstructive sleep apnea, intolerant of CPAP. 7. Diet-controlled diabetes.  8. Hypertension. 9. History of chronic anemia and thrombocytopenia secondary to portal hypertension.  10. He was last admitted for ascites in September 2012.   PAST SURGICAL HISTORY:  1. Cholecystectomy.  2. Multiple paracenteses in the past.   ALLERGIES TO MEDICATIONS: None.   HOME MEDICATIONS: which have been reviewed by the pharmacy tech include: 1. Abilify 15 mg at bedtime.  2. Ciprofloxacin 750 mg once a week. 3. Citalopram 40 mg at bedtime. 4. Lasix 40 mg b.i.d.  5. Lactulose 30 mL b.i.d.  6. Nadolol 20 mg daily. 7. Omeprazole 20 mg b.i.d. 8. Oxycodone 5 mg 1 to 2 tabs every 4 to 6 hours as needed.  9. Spironolactone 25 mg 2 tablets once a day. 10. Symbicort 2 puffs twice daily. 11. Trazodone 300 mg at bedtime.  12. Albuterol 2 puffs q. 6 hours as needed.  13. Vitamin B12  500 mcg daily. 14. Xifaxan 550 mg b.i.d.   SOCIAL HISTORY: He lives in a group home in [place]. He is still smoking about a pack per day. He stopped drinking alcohol about a year ago. No drug use.   FAMILY HISTORY: Father had heart disease and bypass surgery. Also history of  breast cancer in the family.   REVIEW OF SYSTEMS: He denies any fever or chills. Complaining of weight gain.  No acute change in vision. No headache. No dizziness.  He is complaining of cough,  shortness of breath, wheezing. He has chronic obstructive pulmonary disease. No chest pain. No palpitations. No syncope. Complaining of some nausea. He has abdominal pain and distention. No GI bleed. No melena. No dysuria. No frequency. He has history of anemia. He denies any bleeding from any site.  No rash. No joint pains. No focal numbness or weakness. He has history of anxiety and depression.    PHYSICAL EXAMINATION:  VITAL SIGNS:    Temperature 97, heart rate 88, respiratory rate 18, blood pressure 112/74, saturating 100% on room air.  Currently heart rate 64, blood pressure 115/63, saturating 92% on 2 liters nasal cannula.   GENERAL: Middle-aged obese Caucasian male. He is disheveled in appearance, unkempt. He appears to be drowsy.   HEENT: Bilateral pupils are equal. Extraocular muscles intact. No scleral icterus. No conjunctivitis. Oral mucosa is moist. No pallor.   NECK: No thyroid tenderness, enlargement, or nodule. Neck is supple. No masses, nontender. No adenopathy. No JVD. No carotid bruit.   CHEST: Bilateral coarse breath sounds of wheezing. Normal respiratory effort. Not using accessory muscles of respiration.   HEART: Heart sounds are regular. No murmur. Peripheral pulses may be 1+. Minimal lower extremity edema.   ABDOMEN: Significantly distended. It is firm. Umbilicus is everted. He has prominent veins on the abdomen. Could not appreciate any hepatosplenomegaly. There is no significant tenderness, no guarding. But it is firm. No masses palpable.   RECTAL: Deferred.   NEUROLOGIC: He is awake. He is drowsy, but easily arousable. He is sleepy, oriented to time, place, and person. Moving all extremities against gravity.   EXTREMITIES: No cyanosis or clubbing.   SKIN: He has dry eczematous skin of his lower extremities.  LABORATORY, DIAGNOSTIC, AND RADIOLOGICAL DATA: White count 6.5, hemoglobin 14.2, platelets 154,000. BMP: Sodium 142, potassium 4.4, BUN 23, creatinine 1.3. LFTs are normal, alkaline phosphatase 110, ALT 25, AST 27. Lipase 85. His urinalysis shows leukocyte esterase, nitrite negative. CT of the abdomen and pelvis shows cirrhotic liver with a large amount of ascites. Spleen is enlarged.   IMPRESSION:  1. Massive ascites with abdominal pain, rule out spontaneous bacterial peritonitis.  2. Alcoholic liver cirrhosis with portal hypertension.   3. History of varices. 4. Chronic obstructive  pulmonary disease with mild exacerbation. 5. Drowsy, suspect hepatic encephalopathy.  6. History of anxiety and depression.   PLAN: 49 year old male with history of alcoholic cirrhosis, portal hypertension, history of esophageal varices, and hepatic encephalopathy. He presents with worsening ascites.  He had massive ascites on his CT scan. He is complaining of some abdominal pain. His white count is normal.  He takes ciprofloxacin for subacute bacterial endocarditis prophylaxis. I am going to give him IV cefotaxime for suspect SBP.  I will order cell count and albumin on the ascitic fluid. GI will be consulted.  I will order PT before the ultrasound-guided paracentesis.  I will change his Lasix to IV. This can be changed to p.o. once the fluid is drawn.  I will continue his Aldactone. I will check an ammonia level and increase his lactulose and continue his Xifaxan. I will also check an ABG to rule out any hypercapnia because he is drowsy and he has underlying chronic obstructive pulmonary disease. We will continue his nadolol for variceal prophylaxis. Continue his PPI.  We will give him Symbicort and I will start him on Duo-Nebs because he is wheezing. We will continue his psych medications with Abilify, trazodone, and citalopram. We will also order a chest x-ray with his  wheezing and shortness of breath.     TIME SPENT:  50 minutes.  ____________________________ Mena Pauls, MD ag:bjt D: 05/18/2011 22:16:00 ET T: 05/19/2011 07:49:34 ET JOB#: [id number]  cc: [facility] Lollie Sails, MD Mena Pauls, MD, <Dictator> Mena Pauls MD ELECTRONICALLY SIGNED 06/09/2011 10:29

## 2014-09-06 NOTE — Consult Note (Signed)
Brief Consult Note: Diagnosis: Ascites and cirrhosis.   Patient was seen by consultant.   Consult note dictated.   Comments: Cirrhosis of liver. Ascites. Not tense. Respiratory failure unlikely secondary to be ascites. ? encephalopathy. ? SBP. Cell count is not consistent with SBP.  Recommendations: Agree with cefotaxime for suspected SBP. Agree with PPI prophylaxis. Continue lactulose and xiafaxan. Diuretics once vitals are more stable. Will follow.  Electronic Signatures: Jill Side (MD)  (Signed 04-Jan-13 19:10)  Authored: Brief Consult Note   Last Updated: 04-Jan-13 19:10 by Jill Side (MD)

## 2014-09-06 NOTE — Consult Note (Signed)
PATIENT NAME:  Jorge [name], Jorge [name] MR#:  [id number] DATE OF BIRTH:  November 24, 1965  DATE OF CONSULTATION:  05/19/2011  REFERRING PHYSICIAN:  Dr. Lyndel Safe  CONSULTING PHYSICIAN:  Jill Side, MD  REASON FOR CONSULTATION: Cirrhosis of liver and ascites.   HISTORY OF PRESENT ILLNESS: 49 year old male with suspected alcoholic cirrhosis of liver with portal hypertension, history of esophageal varices, history of chronic anemia, thrombocytopenia and ascites. Patient also has history of chronic obstructive pulmonary disease and sleep apnea. He was last admitted in September 2012 with increasing ascites and paracentesis was performed. Patient has been maintained on Lasix and Aldactone and his primary gastroenterologist is Dr. Gustavo Lah. For the last couple of weeks patient is noticing increasing ascites and abdominal swelling. This history was obtained from the patient's chart as patient is currently intubated and unable to provide any history. Patient came to the hospital and was found to be hypoxic with hypercapnia. Patient was started on BiPAP but apparently did not do well and was emergently intubated. Patient is currently on ventilator. Patient was also noted to be severely bradycardic and has been maintained on dopamine drip to treat bradycardia. No further history available from the patient. Significant ascites was noted on the CT scan and gastroenterology was consulted. Apparently patient has been complaining of some abdominal pain as well.   PAST MEDICAL HISTORY:  1. History of alcoholic liver cirrhosis with portal hypertension.  2. History of esophageal varices. Last upper GI endoscopy was in January 2012 which showed grade 2 esophageal varices. 3. History of ascites and multiple paracentesis is in the past.  4. History of chronic obstructive pulmonary disease. 5. Anxiety. 6. Obstructive sleep apnea. 7. Diabetes. 8. Hypertension. 9. History of chronic anemia and thrombocytopenia.   PAST SURGICAL  HISTORY: Cholecystectomy.   ALLERGIES: None.   MEDICATIONS AT HOME:  1. Abilify. 2. Ciprofloxacin once a week.  3. Citalopram 40 mg at bedtime. 4. Lasix 40 mg b.i.d.  5. Lactulose 30 mL b.i.d.  6. Nadolol 20 mg a day. 7. Omeprazole 20 mg b.i.d.  8. Oxycodone p.r.n.  9. Spironolactone 50 mg a day. 10. Trazodone 300 mg at bedtime. 11. Albuterol. 12. Vitamin B12. 13. Xifaxan 550 b.i.d.   SOCIAL HISTORY: He lives in a group home in [place]. He smokes about a pack a day. He stopped drinking alcohol about a year ago.   FAMILY HISTORY: Unremarkable.   REVIEW OF SYSTEMS: Not available.    PHYSICAL EXAMINATION:  GENERAL: Obese male. He is currently intubated and sedated. Clinically he does not appear to be jaundiced.   VITAL SIGNS: Heart rate is in 50s. He is afebrile. Blood pressure about 145/50.   NECK: Neck veins are flat.   LUNGS: Grossly clear to auscultation bilaterally with fair air entry.   CARDIOVASCULAR: Regular rate and rhythm. No gallops or murmur.   ABDOMEN: Mild to moderately distended. There may be ascites, although they are clinically not very evident. Patient's abdomen is certainly not tense and not consistent with massive ascites. Bowel sounds are positive. A periumbilical hernia was noted. Clinically there is no significant hepato- or splenomegaly. Abdomen is fairly benign and nontender.   NEUROLOGIC: Difficult to do because of patient being sedated and intubated.   SKIN: Grossly unremarkable.   LABORATORY, DIAGNOSTIC, AND RADIOLOGICAL DATA: BUN 23, creatinine 1.38. Electrolytes are normal. Liver enzymes are normal. Patient's PT and INR within normal limits. White cell count 3.8, hemoglobin 12.9, hematocrit 38.7, platelet count 108. Ascitic fluid analysis showed 156 cells with only 10%  neutrophils and about 68% monocytes and macrophages. Ascitic fluid albumin 2.1 with a serum albumin of 3.4. Serum ammonia is somewhat elevated at 64. Serum lipase is normal ABG:  pH 7.44, pCO2 47, pO2 66; that is on ventilator; prior to that pH  7.25, pCO2 87, pO2 was only 47.   ASSESSMENT AND PLAN: Patient with known history of cirrhosis, portal hypertension presenting with ascites, some abdominal pain. Initially massive ascites were reported although on physical examination patient does not have tense ascites. He is obese but his ascites appeared to be mild to moderate. Paracentesis was attempted by the radiologist and only 500 mL of fluid was removed. Ascetic fluid analysis is not consistent with SBP, but patient has complained of abdominal discomfort. Patient's ammonia is somewhat elevated although I believe that his mental status changes was mostly related to hypercapnia. Patient's respiratory failure is probably related to his underlying chronic obstructive pulmonary disease and sleep apnea and not related to the ascites which at most appeared to be moderate and not massive. Patient has history of portal hypertension, esophageal varices although there are no signs of GI bleed and his hemoglobin and hematocrit seems to be stable. I agree with current management with antibiotics for suspected SBP. Continue Xifaxan and lactulose to prevent hepatic encephalopathy. Agree with PPI prophylaxis to prevent GI bleeding. Patient's hepatic synthetic functions appear to be fairly normal. Continue the rest of the conservative and supportive measures. Will follow.  ____________________________ Jill Side, MD si:cms D: 05/19/2011 19:18:04 ET T: 05/20/2011 11:06:24 ET JOB#: [id number]  cc: Jill Side, MD, <Dictator> Jill Side MD ELECTRONICALLY SIGNED 05/20/2011 14:37

## 2014-09-06 NOTE — Discharge Summary (Signed)
PATIENT NAME:  Jorge [name], Jorge [name] MR#:  [id number] DATE OF BIRTH:  1965-07-20  DATE OF ADMISSION:  05/18/2011 DATE OF DISCHARGE:  05/25/2011  ADMITTING DIAGNOSES:  1. Abdominal distention.  2. Shortness of breath.  3. Acute encephalopathy, felt to be due to hypercarbia as well as hepatic encephalopathy.   DISCHARGE DIAGNOSES:  1. Worsening abdominal distention due to massive ascites status post paracentesis as well as diuresis, now his abdominal distention is much improved.  2. Acute hypercarbic respiratory failure due to ascites as well as acute on chronic obstructive pulmonary disease exacerbation. The patient was on the ventilator, subsequently extubated. Now he is off oxygen and doing much better.  3. Acute on chronic obstructive pulmonary disease exacerbation status post treatment with steroids, IV antibiotics.  4. Liver cirrhosis with ascites. The patient was placed on SBP prophylaxis. His cultures from the ascitic fluid failed to grow any organisms.  5. Acute metabolic encephalopathy due to hepatic encephalopathy, now resolved.  6. History of portal hypertension.  7. History of esophageal varices with esophagogastroduodenoscopy in January showing esophageal varices and duodenal ulcer.  8. History of anxiety, depression, previous history of suicide attempt requiring a Behavioral Medicine admission May 2010.  9. History of obstructive sleep apnea, intolerant to constant positive airway pressure.  10. Diet-controlled diabetes.  11. Hypertension.  12. History of chronic anemia and thrombocytopenia.  13. Status post cholecystectomy.  14. Multiple paracentesis which have been recurrent in the past.  15. Mild renal insufficiency as a result of changes in fluid status, now resolved.  16. Hyperglycemia, possible reactive, also has borderline diabetes. His diet needs to be monitored.   CONSULTANTS:  1. Dr. Dionne Milo.  2. Dr. Mortimer Fries.   HISTORY: Please see history and physical done by the  admitting physician.   HOSPITAL COURSE: The patient is a 49 year old morbidly obese male with alcohol liver cirrhosis, portal hypertension, chronic ascites, has required recurrent paracentesis who presented to the ED with complaint of abdominal distention as well as was noted to have respiratory distress. Also, was lethargic on presentation. Due to these symptoms, he was admitted.  1. Acute respiratory failure felt to be due to being multifactorial including acute chronic obstructive pulmonary disease exacerbation. He was noted to have hypercarbia, also possibly due to underlying history of sleep apnea. The patient was also lethargic on presentation which likely played a role in CO2 retention. The patient subsequently required intubation and was on the ventilator for a few days and subsequently extubated. His respiratory failure was treated with nebulizers. He was diuresed for his ascites. He also had a paracentesis done. With these treatments, his respiratory status is now improved and he is only needing O2 as needed as previously.  2. Massive ascites, which is a recurrent problem. The patient had to have paracentesis. He was diuresed. Now his abdomen is very soft. He underwent a paracentesis. The culture from his paracentric fluid is not growing any bacteria. The patient has been treated with antibiotic since admission. We will continue the prophylactic, Cipro, that he is on weekly.  3. Acute encephalopathy, was treated with lactulose and his mental status is currently back to normal. Currently, the patient is doing much better and is stable for discharge.   DISCHARGE MEDICATIONS:  1. Oxycodone 5 mg 1 to 2 tabs every 4 to 6 p.r.n. pain. 2. Ventolin 90 mcg 2 puffs q.6 p.r.n.  3. Symbicort 2 puffs b.i.d.  4. Vitamin B12 500, one tab p.o. daily.  5. Omeprazole 20, one tab  p.o. b.i.d.  6. Lasix 40 mg b.i.d.  7. Citalopram 40 mg at bedtime.  8. Trazodone 300 at bedtime.  9. Abilify 15 mg daily at  bedtime. 10. Spironolactone 50 daily.  11. Lactulose 2 tbsp orally b.i.d.  12. Xifaxan 550 mg 1 tab p.o. b.i.d.  13. Nadolol 20, one tab p.o. daily.  14. Cipro 750, one tab once a week. 15. Prednisone taper 30 mg p.o. x2 days, 20 mg p.o. x2 days, 10 mg p.o. x2 days, then stop. 16. Home O2 as needed as previously.   DIET: Low-sodium.   ACTIVITY: As tolerated.   TIMEFRAME FOR FOLLOW-UP: 1 to 2 weeks with [facility], Dr. Gustavo Lah in 2 to 4 weeks with a gastrointestinal follow-up.   TIME SPENT: 35 minutes.   ____________________________ Lafonda Mosses Posey Pronto, MD shp:ap D: 05/25/2011 14:30:57 ET T: 05/26/2011 16:55:05 ET JOB#: [id number]  cc: Sherly Brodbeck H. Posey Pronto, MD, <Dictator> [facility] Lollie Sails, MD Alric Seton MD ELECTRONICALLY SIGNED 05/30/2011 14:22

## 2014-09-06 NOTE — Consult Note (Signed)
Chief Complaint:   Subjective/Chief Complaint Feels better. Awake and alert.   VITAL SIGNS/ANCILLARY NOTES: **Vital Signs.:   08-Jan-13 10:31   Vital Signs Type Upon Transfer   Temperature Temperature (F) 97.4   Celsius 36.3   Temperature Source axillary   Pulse Pulse 63   Pulse source per Dinamap   Respirations Respirations 20   Systolic BP Systolic BP 882   Diastolic BP (mmHg) Diastolic BP (mmHg) 75   Mean BP 99   BP Source Dinamap   Pulse Ox % Pulse Ox % 94   Pulse Ox Activity Level  At rest   Oxygen Delivery 4L; Nasal Cannula   Brief Assessment:   Additional Physical Exam Awake and alert and no clinical signs of encephalopathy.   Routine Hem:  08-Jan-13 04:55    WBC (CBC) 4.3   RBC (CBC) 4.07   Hemoglobin (CBC) 13.0   Hematocrit (CBC) 39.1   Platelet Count (CBC) 74   MCV 96   MCH 32.1   MCHC 33.3   RDW 15.1  Routine Chem:  08-Jan-13 04:55    Glucose, Serum 107   BUN 36   Creatinine (comp) 1.13   Sodium, Serum 142   Potassium, Serum 4.5   Chloride, Serum 104   CO2, Serum 33   Calcium (Total), Serum 8.1   Osmolality (calc) 292   eGFR (African American) >60   eGFR (Non-African American) >60   Anion Gap 5   Ammonia, Plasma 28  Routine Hem:  08-Jan-13 04:55    Neutrophil % 89.5   Lymphocyte % 6.8   Monocyte % 3.6   Eosinophil % 0.0   Basophil % 0.1   Neutrophil # 3.8   Lymphocyte # 0.3   Monocyte # 0.2   Eosinophil # 0.0   Basophil # 0.0   Assessment/Plan:  Assessment/Plan:   Assessment Cirrhosis of liver. Ascites with ? SBP. Ascitic fluid analysis is not consistent with SBP. Encephalopathy, improved.    Plan Agree with current managmeent with lactulose and diuretics. May DC antibiotics for SBP after a total of 7 days and start Cipro prophylaxis for SBP. No further recommendations. Will sign off. Please reconsult if needed. Thanks.   Electronic Signatures: Jill Side (MD)  (Signed 08-Jan-13 11:39)  Authored: Chief Complaint, VITAL  SIGNS/ANCILLARY NOTES, Brief Assessment, Lab Results, Assessment/Plan   Last Updated: 08-Jan-13 11:39 by Jill Side (MD)

## 2014-09-15 IMAGING — US US GUIDE NEEDLE - US PARA
1 series · 9 of 9 positions shown · non-contrast
Comparison: None.

CLINICAL DATA: Hepatic cirrhosis, abdominal pain.

EXAM:
LIMITED ABDOMEN ULTRASOUND FOR ASCITES
TECHNIQUE: Limited ultrasound survey for ascites was performed in all four
abdominal quadrants.

[Series 1: us guide needle - us para · 0.27mm/px · 9 of 9 slices shown]
[im 1/9]
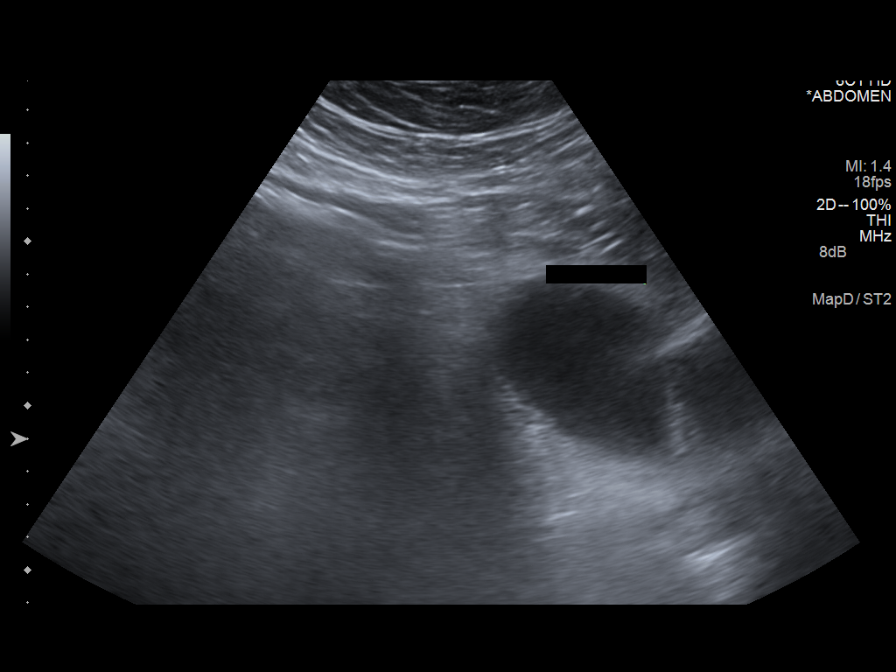
[im 2/9]
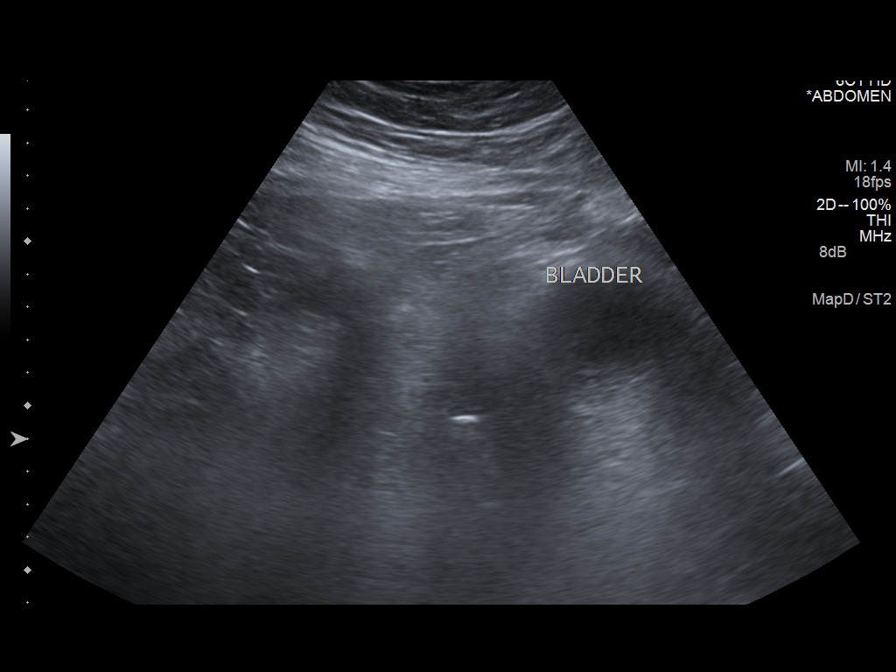
[im 3/9]
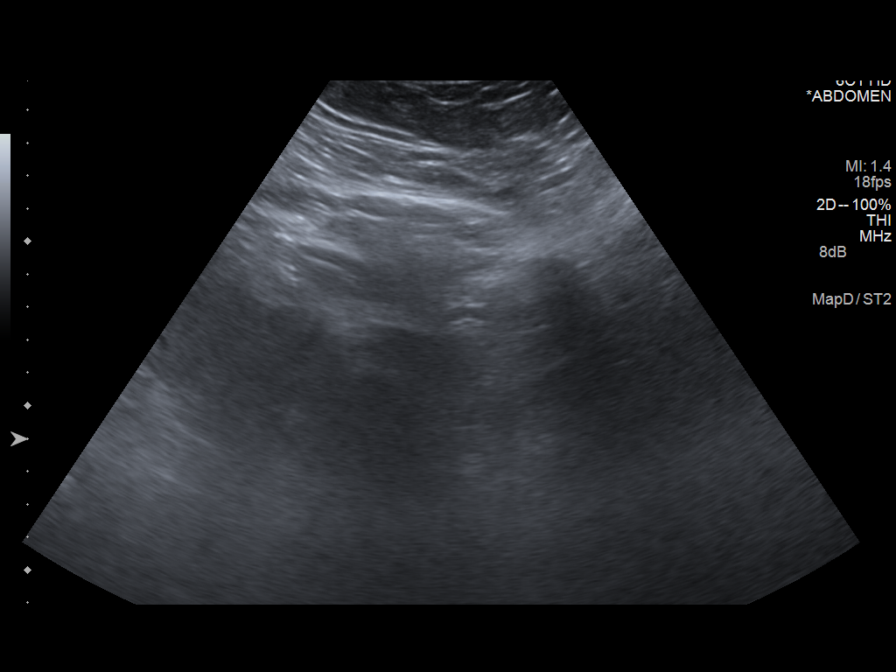
[im 4/9]
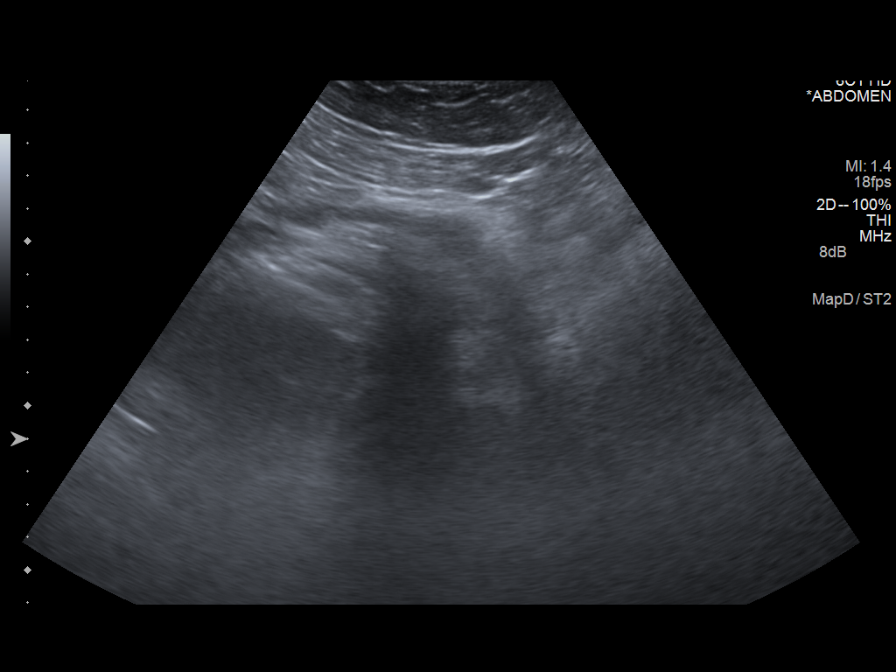
[im 5/9]
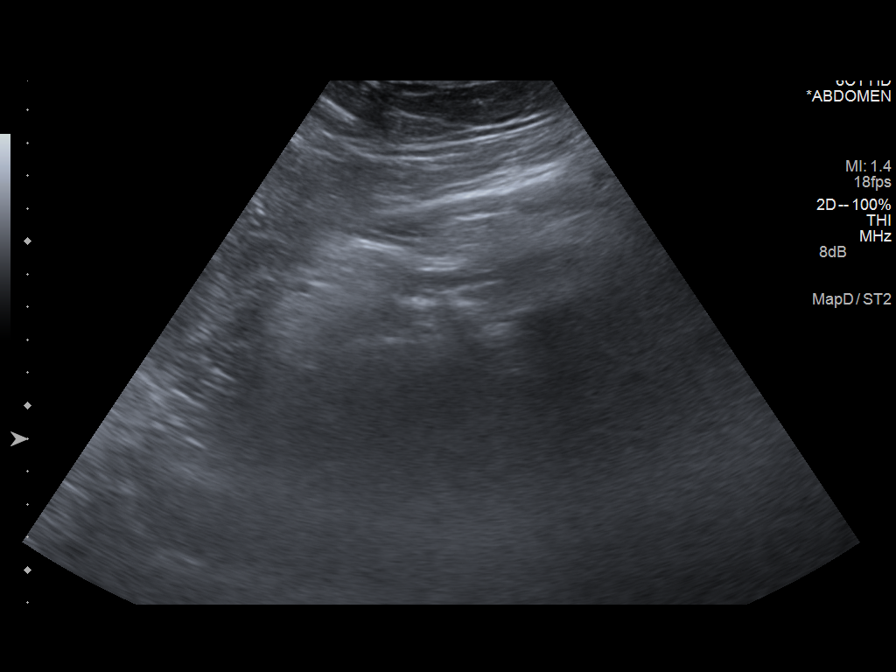
[im 6/9]
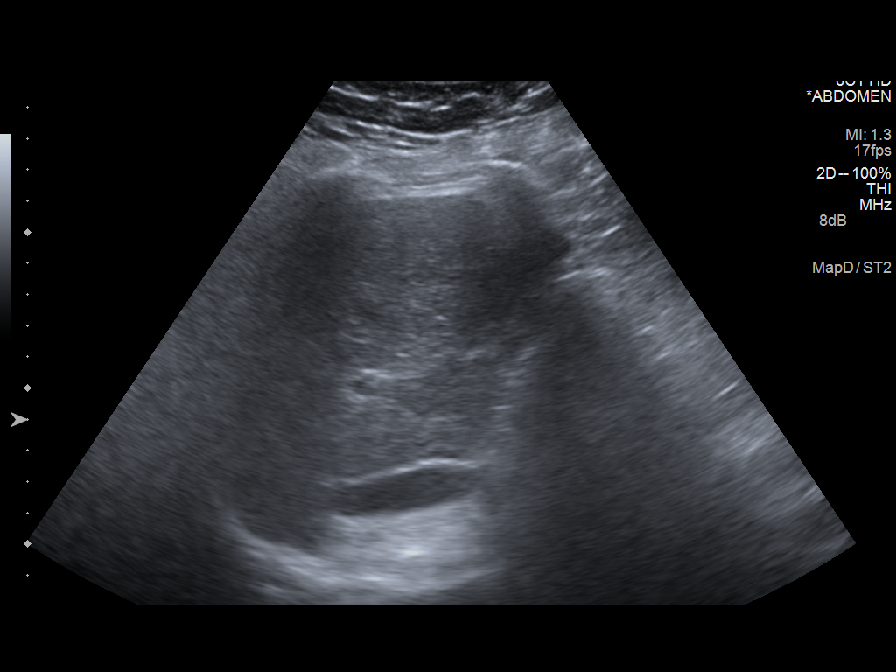
[im 7/9]
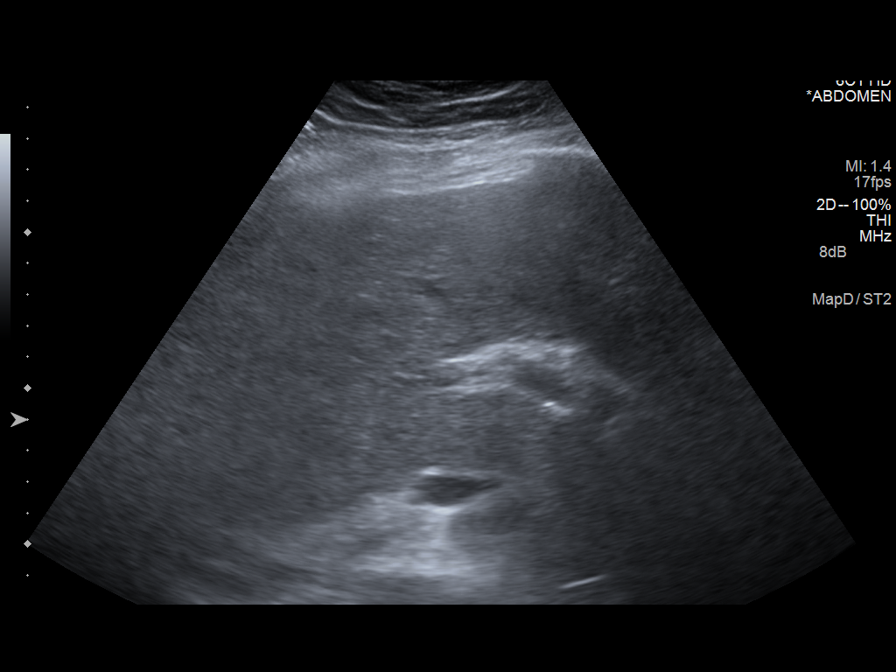
[im 8/9]
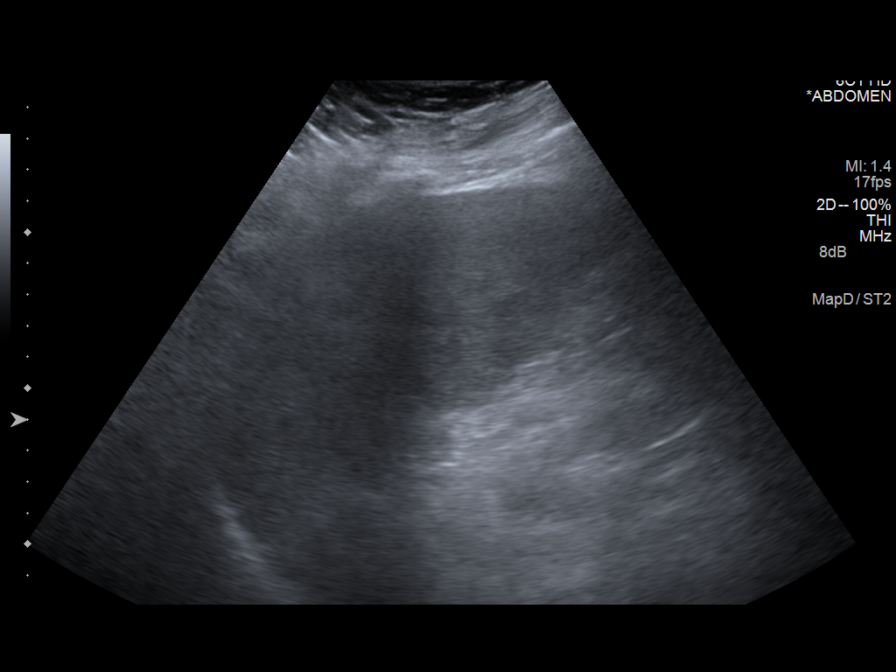
[im 9/9]
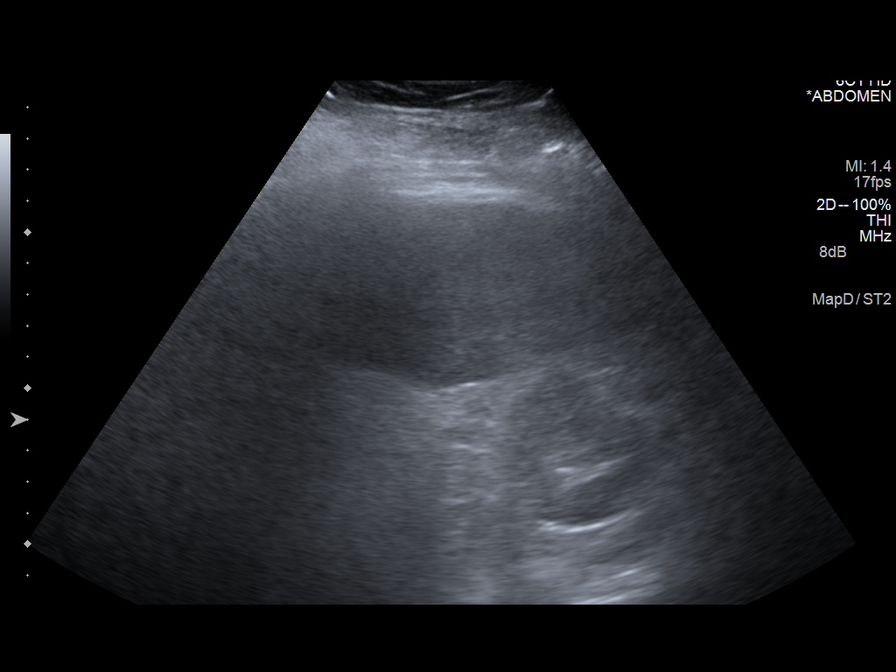

[9 of 9 positions shown; findings below may reference images not displayed]

FINDINGS: Ultrasound evaluation of all 4 quadrants of the abdomen demonstrates
no evidence of ascites. Therefore, paracentesis was not performed.
IMPRESSION: No ascites is noted.

## 2014-10-03 ENCOUNTER — Emergency Department (HOSPITAL_COMMUNITY): Payer: Medicare Other

## 2014-10-03 ENCOUNTER — Encounter (HOSPITAL_COMMUNITY): Payer: Self-pay | Admitting: Emergency Medicine

## 2014-10-03 ENCOUNTER — Inpatient Hospital Stay (HOSPITAL_COMMUNITY)
Admission: EM | Admit: 2014-10-03 | Discharge: 2014-10-07 | DRG: 190 | Disposition: A | Discharge status: Nursing Home (Includes ICF) | Payer: Medicare Other | Attending: Internal Medicine | Admitting: Internal Medicine

## 2014-10-03 DIAGNOSIS — F329 Major depressive disorder, single episode, unspecified: Secondary | ICD-10-CM | POA: Diagnosis present

## 2014-10-03 DIAGNOSIS — J441 Chronic obstructive pulmonary disease with (acute) exacerbation: Secondary | ICD-10-CM | POA: Diagnosis not present

## 2014-10-03 DIAGNOSIS — N289 Disorder of kidney and ureter, unspecified: Secondary | ICD-10-CM

## 2014-10-03 DIAGNOSIS — R0602 Shortness of breath: Secondary | ICD-10-CM | POA: Diagnosis not present

## 2014-10-03 DIAGNOSIS — K7460 Unspecified cirrhosis of liver: Secondary | ICD-10-CM | POA: Diagnosis present

## 2014-10-03 DIAGNOSIS — R609 Edema, unspecified: Secondary | ICD-10-CM | POA: Diagnosis not present

## 2014-10-03 DIAGNOSIS — G8929 Other chronic pain: Secondary | ICD-10-CM | POA: Diagnosis not present

## 2014-10-03 DIAGNOSIS — R0902 Hypoxemia: Secondary | ICD-10-CM | POA: Diagnosis not present

## 2014-10-03 DIAGNOSIS — J9601 Acute respiratory failure with hypoxia: Secondary | ICD-10-CM | POA: Diagnosis present

## 2014-10-03 DIAGNOSIS — I129 Hypertensive chronic kidney disease with stage 1 through stage 4 chronic kidney disease, or unspecified chronic kidney disease: Secondary | ICD-10-CM | POA: Diagnosis present

## 2014-10-03 DIAGNOSIS — I10 Essential (primary) hypertension: Secondary | ICD-10-CM | POA: Diagnosis present

## 2014-10-03 DIAGNOSIS — J449 Chronic obstructive pulmonary disease, unspecified: Secondary | ICD-10-CM | POA: Diagnosis not present

## 2014-10-03 DIAGNOSIS — R062 Wheezing: Secondary | ICD-10-CM | POA: Diagnosis not present

## 2014-10-03 DIAGNOSIS — R030 Elevated blood-pressure reading, without diagnosis of hypertension: Secondary | ICD-10-CM | POA: Diagnosis not present

## 2014-10-03 DIAGNOSIS — Z79899 Other long term (current) drug therapy: Secondary | ICD-10-CM

## 2014-10-03 DIAGNOSIS — N189 Chronic kidney disease, unspecified: Secondary | ICD-10-CM | POA: Diagnosis present

## 2014-10-03 DIAGNOSIS — G4700 Insomnia, unspecified: Secondary | ICD-10-CM | POA: Diagnosis present

## 2014-10-03 DIAGNOSIS — F17210 Nicotine dependence, cigarettes, uncomplicated: Secondary | ICD-10-CM | POA: Diagnosis present

## 2014-10-03 DIAGNOSIS — K219 Gastro-esophageal reflux disease without esophagitis: Secondary | ICD-10-CM | POA: Diagnosis present

## 2014-10-03 LAB — CBC WITH DIFFERENTIAL/PLATELET
BASOS PCT: 0 % (ref 0–1)
Basophils Absolute: 0 10*3/uL (ref 0.0–0.1)
EOS PCT: 0 % (ref 0–5)
Eosinophils Absolute: 0 10*3/uL (ref 0.0–0.7)
HCT: 41.5 % (ref 39.0–52.0)
Hemoglobin: 13.4 g/dL (ref 13.0–17.0)
Lymphocytes Relative: 19 % (ref 12–46)
Lymphs Abs: 0.9 10*3/uL (ref 0.7–4.0)
MCH: 33.7 pg (ref 26.0–34.0)
MCHC: 32.3 g/dL (ref 30.0–36.0)
MCV: 104.3 fL — ABNORMAL HIGH (ref 78.0–100.0)
Monocytes Absolute: 0.2 10*3/uL (ref 0.1–1.0)
Monocytes Relative: 5 % (ref 3–12)
NEUTROS ABS: 3.5 10*3/uL (ref 1.7–7.7)
Neutrophils Relative %: 76 % (ref 43–77)
Platelets: 89 10*3/uL — ABNORMAL LOW (ref 150–400)
RBC: 3.98 MIL/uL — AB (ref 4.22–5.81)
RDW: 14.6 % (ref 11.5–15.5)
WBC: 4.7 10*3/uL (ref 4.0–10.5)

## 2014-10-03 LAB — BASIC METABOLIC PANEL
ANION GAP: 6 (ref 5–15)
BUN: 30 mg/dL — AB (ref 6–20)
CO2: 27 mmol/L (ref 22–32)
Calcium: 8.1 mg/dL — ABNORMAL LOW (ref 8.9–10.3)
Chloride: 109 mmol/L (ref 101–111)
Creatinine, Ser: 1.56 mg/dL — ABNORMAL HIGH (ref 0.61–1.24)
GFR calc Af Amer: 58 mL/min — ABNORMAL LOW (ref >60)
GFR calc non Af Amer: 50 mL/min — ABNORMAL LOW (ref >60)
Glucose, Bld: 122 mg/dL — ABNORMAL HIGH (ref 65–99)
Potassium: 4.3 mmol/L (ref 3.5–5.1)
Sodium: 142 mmol/L (ref 135–145)

## 2014-10-03 LAB — BLOOD GAS, ARTERIAL
ACID-BASE EXCESS: 1.5 mmol/L (ref 0.0–2.0)
Bicarbonate: 26.6 mEq/L — ABNORMAL HIGH (ref 20.0–24.0)
DRAWN BY: 382351
FIO2: 0.28 %
O2 Saturation: 94.1 %
PO2 ART: 72.8 mmHg — AB (ref 80.0–100.0)
Patient temperature: 37
TCO2: 23.7 mmol/L (ref 0–100)
pCO2 arterial: 49.7 mmHg — ABNORMAL HIGH (ref 35.0–45.0)
pH, Arterial: 7.347 — ABNORMAL LOW (ref 7.350–7.450)

## 2014-10-03 MED ORDER — PANTOPRAZOLE SODIUM 40 MG PO TBEC
40.0000 mg | DELAYED_RELEASE_TABLET | Freq: Every day | ORAL | Status: DC
  Administered 2014-10-03 – 2014-10-07 (×5): 40 mg via ORAL
  Filled 2014-10-03 (×5): qty 1

## 2014-10-03 MED ORDER — PREDNISONE 50 MG PO TABS
60.0000 mg | ORAL_TABLET | Freq: Once | ORAL | Status: AC
  Administered 2014-10-03: 60 mg via ORAL
  Filled 2014-10-03 (×2): qty 1

## 2014-10-03 MED ORDER — SODIUM CHLORIDE 0.9 % IV SOLN: 250.0000 mL | INTRAVENOUS | Status: DC | PRN

## 2014-10-03 MED ORDER — ALBUTEROL SULFATE (2.5 MG/3ML) 0.083% IN NEBU
2.5000 mg | INHALATION_SOLUTION | Freq: Once | RESPIRATORY_TRACT | Status: AC
  Administered 2014-10-03: 2.5 mg via RESPIRATORY_TRACT
  Filled 2014-10-03: qty 3

## 2014-10-03 MED ORDER — BUDESONIDE-FORMOTEROL FUMARATE 160-4.5 MCG/ACT IN AERO
1.0000 | INHALATION_SPRAY | Freq: Two times a day (BID) | RESPIRATORY_TRACT | Status: DC
Start: 2014-10-03 — End: 2014-10-07
  Administered 2014-10-04 – 2014-10-07 (×7): 1 via RESPIRATORY_TRACT
  Filled 2014-10-03: qty 6

## 2014-10-03 MED ORDER — TRAZODONE HCL 50 MG PO TABS
300.0000 mg | ORAL_TABLET | Freq: Every day | ORAL | Status: DC
  Administered 2014-10-03 – 2014-10-06 (×4): 300 mg via ORAL
  Filled 2014-10-03 (×4): qty 6

## 2014-10-03 MED ORDER — HEPARIN SODIUM (PORCINE) 5000 UNIT/ML IJ SOLN
5000.0000 [IU] | Freq: Three times a day (TID) | INTRAMUSCULAR | Status: DC
  Administered 2014-10-03 – 2014-10-07 (×11): 5000 [IU] via SUBCUTANEOUS
  Filled 2014-10-03 (×11): qty 1

## 2014-10-03 MED ORDER — ARIPIPRAZOLE 10 MG PO TABS
30.0000 mg | ORAL_TABLET | Freq: Every day | ORAL | Status: DC
  Administered 2014-10-03 – 2014-10-06 (×4): 30 mg via ORAL
  Filled 2014-10-03 (×4): qty 3
  Filled 2014-10-03 (×2): qty 2

## 2014-10-03 MED ORDER — ALBUTEROL SULFATE (2.5 MG/3ML) 0.083% IN NEBU
5.0000 mg | INHALATION_SOLUTION | Freq: Once | RESPIRATORY_TRACT | Status: AC
  Administered 2014-10-03: 5 mg via RESPIRATORY_TRACT
  Filled 2014-10-03: qty 6

## 2014-10-03 MED ORDER — CITALOPRAM HYDROBROMIDE 20 MG PO TABS
40.0000 mg | ORAL_TABLET | Freq: Every day | ORAL | Status: DC
  Administered 2014-10-03 – 2014-10-06 (×4): 40 mg via ORAL
  Filled 2014-10-03: qty 2
  Filled 2014-10-03: qty 1
  Filled 2014-10-03 (×2): qty 2
  Filled 2014-10-03: qty 1
  Filled 2014-10-03: qty 2

## 2014-10-03 MED ORDER — SODIUM CHLORIDE 0.9 % IJ SOLN
3.0000 mL | Freq: Two times a day (BID) | INTRAMUSCULAR | Status: DC
  Administered 2014-10-03 – 2014-10-07 (×8): 3 mL via INTRAVENOUS

## 2014-10-03 MED ORDER — IPRATROPIUM-ALBUTEROL 0.5-2.5 (3) MG/3ML IN SOLN
3.0000 mL | RESPIRATORY_TRACT | Status: DC
  Administered 2014-10-04 – 2014-10-05 (×13): 3 mL via RESPIRATORY_TRACT
  Filled 2014-10-03 (×13): qty 3

## 2014-10-03 MED ORDER — METOPROLOL SUCCINATE ER 50 MG PO TB24
50.0000 mg | ORAL_TABLET | Freq: Every day | ORAL | Status: DC
  Administered 2014-10-04 – 2014-10-07 (×4): 50 mg via ORAL
  Filled 2014-10-03 (×6): qty 1

## 2014-10-03 MED ORDER — IPRATROPIUM-ALBUTEROL 0.5-2.5 (3) MG/3ML IN SOLN
3.0000 mL | Freq: Once | RESPIRATORY_TRACT | Status: AC
  Administered 2014-10-03: 3 mL via RESPIRATORY_TRACT
  Filled 2014-10-03: qty 3

## 2014-10-03 MED ORDER — FUROSEMIDE 10 MG/ML IJ SOLN
40.0000 mg | Freq: Two times a day (BID) | INTRAMUSCULAR | Status: DC
  Administered 2014-10-04 – 2014-10-07 (×7): 40 mg via INTRAVENOUS
  Filled 2014-10-03 (×7): qty 4

## 2014-10-03 MED ORDER — ALBUTEROL (5 MG/ML) CONTINUOUS INHALATION SOLN
10.0000 mg/h | INHALATION_SOLUTION | Freq: Once | RESPIRATORY_TRACT | Status: AC
  Administered 2014-10-03: 10 mg/h via RESPIRATORY_TRACT
  Filled 2014-10-03: qty 20

## 2014-10-03 MED ORDER — DOXYCYCLINE HYCLATE 100 MG PO TABS
100.0000 mg | ORAL_TABLET | Freq: Two times a day (BID) | ORAL | Status: DC
  Administered 2014-10-03 – 2014-10-07 (×8): 100 mg via ORAL
  Filled 2014-10-03 (×8): qty 1

## 2014-10-03 MED ORDER — ACETAMINOPHEN 325 MG PO TABS: 650.0000 mg | ORAL_TABLET | Freq: Four times a day (QID) | ORAL | Status: DC | PRN

## 2014-10-03 MED ORDER — ACETAMINOPHEN 650 MG RE SUPP: 650.0000 mg | Freq: Four times a day (QID) | RECTAL | Status: DC | PRN

## 2014-10-03 MED ORDER — ONDANSETRON HCL 4 MG PO TABS: 4.0000 mg | ORAL_TABLET | Freq: Four times a day (QID) | ORAL | Status: DC | PRN

## 2014-10-03 MED ORDER — SPIRONOLACTONE 25 MG PO TABS
25.0000 mg | ORAL_TABLET | Freq: Two times a day (BID) | ORAL | Status: DC
  Administered 2014-10-03 – 2014-10-07 (×8): 25 mg via ORAL
  Filled 2014-10-03 (×12): qty 1

## 2014-10-03 MED ORDER — OXYCODONE HCL 5 MG PO TABS
5.0000 mg | ORAL_TABLET | Freq: Four times a day (QID) | ORAL | Status: DC | PRN
  Administered 2014-10-04 – 2014-10-06 (×5): 5 mg via ORAL
  Filled 2014-10-03 (×5): qty 1

## 2014-10-03 MED ORDER — GABAPENTIN 300 MG PO CAPS
300.0000 mg | ORAL_CAPSULE | Freq: Three times a day (TID) | ORAL | Status: DC
  Administered 2014-10-03 – 2014-10-07 (×11): 300 mg via ORAL
  Filled 2014-10-03 (×11): qty 1

## 2014-10-03 MED ORDER — ONDANSETRON HCL 4 MG/2ML IJ SOLN: 4.0000 mg | Freq: Four times a day (QID) | INTRAMUSCULAR | Status: DC | PRN

## 2014-10-03 MED ORDER — SODIUM CHLORIDE 0.9 % IJ SOLN
3.0000 mL | INTRAMUSCULAR | Status: DC | PRN
  Administered 2014-10-05 (×2): 3 mL via INTRAVENOUS
  Filled 2014-10-03 (×2): qty 3

## 2014-10-03 MED ORDER — METHYLPREDNISOLONE SODIUM SUCC 125 MG IJ SOLR
60.0000 mg | Freq: Four times a day (QID) | INTRAMUSCULAR | Status: DC
Start: 2014-10-03 — End: 2014-10-06
  Administered 2014-10-03 – 2014-10-06 (×10): 60 mg via INTRAVENOUS
  Filled 2014-10-03 (×11): qty 2

## 2014-10-03 MED ORDER — RIFAXIMIN 550 MG PO TABS
550.0000 mg | ORAL_TABLET | Freq: Two times a day (BID) | ORAL | Status: DC
  Administered 2014-10-03 – 2014-10-07 (×8): 550 mg via ORAL
  Filled 2014-10-03 (×12): qty 1

## 2014-10-03 NOTE — ED Notes (Signed)
Patient ambulated in hallway with pulse ox. Heart rate was 200 , O2 stats between 85 and 89.  BP elevated at this time also. Dr Christy Gentles made aware. EDP in room now.

## 2014-10-03 NOTE — ED Notes (Signed)
Phlebotomy at bedside.

## 2014-10-03 NOTE — ED Notes (Signed)
MD Wickline at bedside. 

## 2014-10-03 NOTE — ED Notes (Signed)
[facility] Contact Info:   Levie Heritage:   [phone] [place]

## 2014-10-03 NOTE — ED Provider Notes (Signed)
CSN: [id number]     Arrival date & time 10/03/14  1715 History   First MD Initiated Contact with Patient 10/03/14 1722     Chief Complaint  Patient presents with  . Shortness of Breath    Patient is a 49 y.o. male presenting with shortness of breath. The history is provided by the patient.  Shortness of Breath Severity:  Moderate Onset quality:  Gradual Duration:  3 days Timing:  Intermittent Progression:  Worsening Chronicity:  Recurrent Relieved by:  Nothing Worsened by:  Nothing tried Associated symptoms: cough   Associated symptoms: no fever, no hemoptysis and no vomiting   Associated symptoms comment:  Chest wall pain from cough  Risk factors: no hx of PE/DVT   Pt reports recent cough/SOB/wheezing He reports it is worsening He reports feels similar to prior COPD He is a smoker Denies h/o CAD/VTE   Past Medical History  Diagnosis Date  . COPD (chronic obstructive pulmonary disease)   . Cirrhosis   . Hypertension    Past Surgical History  Procedure Laterality Date  . Cholecystectomy     No family history on file. History  Substance Use Topics  . Smoking status: Current Every Day Smoker -- 0.50 packs/day    Types: Cigarettes  . Smokeless tobacco: Not on file  . Alcohol Use: No    Review of Systems  Constitutional: Negative for fever.  Respiratory: Positive for cough and shortness of breath. Negative for hemoptysis.   Cardiovascular: Positive for leg swelling.       Acute on chronic LE edema  Gastrointestinal: Negative for vomiting.  All other systems reviewed and are negative.     Allergies  Penicillins  Home Medications   Prior to Admission medications   Medication Sig Start Date End Date Taking? Authorizing Provider  ARIPiprazole (ABILIFY) 30 MG tablet Take 30 mg by mouth at bedtime.  12/03/13   Historical Provider, MD  citalopram (CELEXA) 40 MG tablet Take 40 mg by mouth at bedtime.  12/03/13   Historical Provider, MD  dextromethorphan (DELSYM)  30 MG/5ML liquid Take 30 mg by mouth every 12 (twelve) hours.    Historical Provider, MD  furosemide (LASIX) 40 MG tablet Take 40 mg by mouth daily. 12/03/13   Historical Provider, MD  gabapentin (NEURONTIN) 300 MG capsule Take 300 mg by mouth 3 (three) times daily. 12/03/13   Historical Provider, MD  levofloxacin (LEVAQUIN) 500 MG tablet Take 1 tablet (500 mg total) by mouth daily at 6 PM. 09/02/14   Rosita Fire, MD  metoprolol succinate (TOPROL-XL) 50 MG 24 hr tablet Take 50 mg by mouth daily. Take with or immediately following a meal.    Historical Provider, MD  omeprazole (PRILOSEC) 20 MG capsule Take 20 mg by mouth daily.    Historical Provider, MD  oxyCODONE (OXY IR/ROXICODONE) 5 MG immediate release tablet Take 5 mg by mouth every 6 (six) hours as needed for severe pain.     Historical Provider, MD  predniSONE (DELTASONE) 10 MG tablet 40 mg po daily for 3 days, 30 mg po daily for 3 days, 20 mg po daily for 3 days and 10 mg po daily for 3 days then stop 09/02/14   Rosita Fire, MD  PROAIR HFA 108 (90 BASE) MCG/ACT inhaler Inhale 2 puffs into the lungs every 4 (four) hours as needed. Shortness of breath/wheeze 12/05/13   Historical Provider, MD  spironolactone (ALDACTONE) 25 MG tablet Take 25 mg by mouth 2 (two) times daily. 12/03/13  Historical Provider, MD  SYMBICORT 160-4.5 MCG/ACT inhaler Inhale 1 puff into the lungs 2 (two) times daily. 12/08/13   Historical Provider, MD  Tiotropium Bromide Monohydrate 2.5 MCG/ACT AERS Inhale 2 puffs into the lungs daily.    Historical Provider, MD  traZODone (DESYREL) 150 MG tablet Take 300 mg by mouth at bedtime.  12/03/13   Historical Provider, MD  vitamin B-12 (CYANOCOBALAMIN) 500 MCG tablet Take 500 mcg by mouth daily.    Historical Provider, MD  XIFAXAN 550 MG TABS tablet Take 550 mg by mouth 2 (two) times daily. 12/03/13   Historical Provider, MD   BP 100/83 mmHg  Pulse 78  Temp(Src) 97.7 F (36.5 C) (Oral)  Resp 20  SpO2 98% Physical  Exam CONSTITUTIONAL: Well developed/well nourished HEAD: Normocephalic/atraumatic EYES: EOMI/PERRL ENMT: Mucous membranes moist NECK: supple no meningeal signs SPINE/BACK:entire spine nontender CV: S1/S2 noted, no murmurs/rubs/gallops noted LUNGS: diffuse wheezing noted bilaterally, mild tachypnea noted ABDOMEN: soft, nontender, no rebound or guarding, obese GU:no cva tenderness NEURO: Pt is awake/alert/appropriate, moves all extremitiesx4.  No facial droop.   EXTREMITIES: pulses normal/equal, full ROM, pitting edema noted to bilateral LE SKIN: warm, color normal PSYCH: no abnormalities of mood noted, alert and oriented to situation  ED Course  Procedures  CRITICAL CARE Performed by: Sharyon Cable Total critical care time: 35 Critical care time was exclusive of separately billable procedures and treating other patients. Critical care was necessary to treat or prevent imminent or life-threatening deterioration. Critical care was time spent personally by me on the following activities: development of treatment plan with patient and/or surrogate as well as nursing, discussions with consultants, evaluation of patient's response to treatment, examination of patient, obtaining history from patient or surrogate, ordering and performing treatments and interventions, ordering and review of laboratory studies, ordering and review of radiographic studies, pulse oximetry and re-evaluation of patient's condition. PATIENT WITH MULTIPLE NEBULIZED TREATMENTS AND STILL HYPOXIA/TACHYPNEA ON EXERTION   6:27 PM Pt with significant wheezing on arrival He has had some improvement but still wheezing bilaterally Will given continuous nebs 8:48 PM After mutliple nebulized treatments/prednisone pt had significant tachypnea/hypoxia with ambulation After sitting down he improved He will need admission for further treatment D/w dr Marily Memos will admit  Labs Review Labs Reviewed  BASIC METABOLIC PANEL -  Abnormal; Notable for the following:    Glucose, Bld 122 (*)    BUN 30 (*)    Creatinine, Ser 1.56 (*)    Calcium 8.1 (*)    GFR calc non Af Amer 50 (*)    GFR calc Af Amer 58 (*)    All other components within normal limits  CBC WITH DIFFERENTIAL/PLATELET - Abnormal; Notable for the following:    RBC 3.98 (*)    MCV 104.3 (*)    Platelets 89 (*)    All other components within normal limits    Imaging Review Dg Chest Portable 1 View  10/03/2014   CLINICAL DATA:  Shortness of breath, bronchitis, COPD.  EXAM: PORTABLE CHEST - 1 VIEW  COMPARISON:  08/28/2016  FINDINGS: There is cardiomegaly. No confluent airspace opacity or effusion. No edema. No acute bony abnormality.  IMPRESSION: Mild cardiomegaly.  No active disease.   Electronically Signed   By: Rolm Baptise M.D.   On: 10/03/2014 18:09     EKG Interpretation   Date/Time:  Saturday Oct 03 2014 17:25:37 EDT Ventricular Rate:  71 PR Interval:  186 QRS Duration: 81 QT Interval:  400 QTC Calculation: 435 R Axis:  47 Text Interpretation:  Sinus rhythm Low voltage, precordial leads Probable  anteroseptal infarct, old No significant change since last tracing  Confirmed by [facility]  MD, Linkon Siverson ([id number]) on 10/03/2014 5:30:03 PM     Medications  ipratropium-albuterol (DUONEB) 0.5-2.5 (3) MG/3ML nebulizer solution 3 mL (3 mLs Nebulization Given 10/03/14 1743)  predniSONE (DELTASONE) tablet 60 mg (60 mg Oral Given 10/03/14 1734)  albuterol (PROVENTIL) (2.5 MG/3ML) 0.083% nebulizer solution 2.5 mg (2.5 mg Nebulization Given 10/03/14 1743)  albuterol (PROVENTIL,VENTOLIN) solution continuous neb (10 mg/hr Nebulization Given 10/03/14 1855)  albuterol (PROVENTIL) (2.5 MG/3ML) 0.083% nebulizer solution 5 mg (5 mg Nebulization Given 10/03/14 2045)    MDM   Final diagnoses:  Renal insufficiency  Chronic obstructive pulmonary disease with acute exacerbation    Nursing notes including past medical history and social history reviewed and  considered in documentation xrays/imaging reviewed by myself and considered during evaluation Labs/vital reviewed myself and considered during evaluation Previous records reviewed and considered     Ripley Fraise, MD 10/03/14 2049

## 2014-10-03 NOTE — ED Notes (Signed)
Patient is getting hour long neb treatment at this time. Will ambulate once finished. RN made aware.

## 2014-10-03 NOTE — ED Notes (Signed)
Respiratory at bedside.

## 2014-10-03 NOTE — H&P (Signed)
[facility] History and Physical  Jorge [name] WPY:[id number] DOB: [birth year] DOA: 10/03/2014  Referring physician: Dr Christy Gentles - [facility] PCP: Rosita Fire, MD   Chief Complaint: SOB  HPI: Jorge [name] is a 49 y.o. male  3 days ago patient developed shortness of breath, coughing and wheezing. Cough is productive. Denies fevers, chest pain, abdominal pain, nausea, vomiting, diarrhea. Patient has also developed worsening bilateral lower extremity and upper extremity edema. Breathing treatments at home have been ineffective. Symptoms are intermittent but becoming progressively more frequent and worse. Patient was compliant with his home medications.     Review of Systems:  Constitutional:  No weight loss, night sweats, Fevers, chills, fatigue.  HEENT:  No headaches, Difficulty swallowing,Tooth/dental problems, Cardio-vascular:  No chest pain, Orthopnea, PND,  dizziness, palpitations  GI:  No heartburn, indigestion, abdominal pain, nausea, vomiting, diarrhea, change in bowel habits, loss of appetite  Resp:  Skin:  no rash or lesions.  GU:  no dysuria, change in color of urine, no urgency or frequency. No flank pain.  Musculoskeletal:   No joint pain or swelling. No decreased range of motion. No back pain.  Psych:  No change in mood or affect. No depression or anxiety. No memory loss.   Past Medical History  Diagnosis Date  . COPD (chronic obstructive pulmonary disease)   . Cirrhosis   . Hypertension    Past Surgical History  Procedure Laterality Date  . Cholecystectomy     Social History:  reports that he has been smoking Cigarettes.  He has been smoking about 0.50 packs per day. He does not have any smokeless tobacco history on file. He reports that he does not drink alcohol or use illicit drugs.  Allergies  Allergen Reactions  . Penicillins Anaphylaxis    Patient states he is not allergic to anything;  Pt says it's his twin brother that has this allergy.     Family History  Problem Relation Age of Onset  . Heart disease Father   . Cancer Mother     breast     Prior to Admission medications   Medication Sig Start Date End Date Taking? Authorizing Provider  ARIPiprazole (ABILIFY) 30 MG tablet Take 30 mg by mouth at bedtime.  12/03/13  Yes Historical Provider, MD  ciprofloxacin (CIPRO) 750 MG tablet Take 750 mg by mouth every Wednesday.   Yes Historical Provider, MD  citalopram (CELEXA) 40 MG tablet Take 40 mg by mouth at bedtime.  12/03/13  Yes Historical Provider, MD  dextromethorphan (DELSYM) 30 MG/5ML liquid Take 30 mg by mouth every 12 (twelve) hours.   Yes Historical Provider, MD  furosemide (LASIX) 40 MG tablet Take 40 mg by mouth daily. 12/03/13  Yes Historical Provider, MD  gabapentin (NEURONTIN) 300 MG capsule Take 300 mg by mouth 3 (three) times daily. 12/03/13  Yes Historical Provider, MD  metoprolol succinate (TOPROL-XL) 50 MG 24 hr tablet Take 50 mg by mouth daily. Take with or immediately following a meal.   Yes Historical Provider, MD  omeprazole (PRILOSEC) 20 MG capsule Take 20 mg by mouth daily.   Yes Historical Provider, MD  oxyCODONE (OXY IR/ROXICODONE) 5 MG immediate release tablet Take 5 mg by mouth every 6 (six) hours as needed for severe pain.    Yes Historical Provider, MD  predniSONE (DELTASONE) 10 MG tablet 40 mg po daily for 3 days, 30 mg po daily for 3 days, 20 mg po daily for 3 days and 10 mg po daily for 3  days then stop Patient taking differently: Take 10 mg by mouth daily.  09/02/14  Yes Rosita Fire, MD  PROAIR HFA 108 (90 BASE) MCG/ACT inhaler Inhale 2 puffs into the lungs every 4 (four) hours as needed. Shortness of breath/wheeze 12/05/13  Yes Historical Provider, MD  spironolactone (ALDACTONE) 25 MG tablet Take 25 mg by mouth 2 (two) times daily. 12/03/13  Yes Historical Provider, MD  SYMBICORT 160-4.5 MCG/ACT inhaler Inhale 1 puff into the lungs 2 (two) times daily. 12/08/13  Yes Historical Provider, MD   Tiotropium Bromide Monohydrate 2.5 MCG/ACT AERS Inhale 2 puffs into the lungs daily.   Yes Historical Provider, MD  traZODone (DESYREL) 150 MG tablet Take 300 mg by mouth at bedtime.  12/03/13  Yes Historical Provider, MD  vitamin B-12 (CYANOCOBALAMIN) 500 MCG tablet Take 500 mcg by mouth daily.   Yes Historical Provider, MD  XIFAXAN 550 MG TABS tablet Take 550 mg by mouth 2 (two) times daily. 12/03/13  Yes Historical Provider, MD  levofloxacin (LEVAQUIN) 500 MG tablet Take 1 tablet (500 mg total) by mouth daily at 6 PM. Patient not taking: Reported on 10/03/2014 09/02/14   Rosita Fire, MD   Physical Exam: Filed Vitals:   10/03/14 1830 10/03/14 1855 10/03/14 2024 10/03/14 2047  BP: 102/67  127/101   Pulse: 71  86   Temp:      TempSrc:      Resp: 21  40   SpO2: 100% 96% 100% 100%    Wt Readings from Last 3 Encounters:  09/01/14 145.65 kg (321 lb 1.6 oz)  07/09/14 145.605 kg (321 lb)  03/18/14 138.347 kg (305 lb)    General: ANxious Eyes:  PERRL, normal lids, irises & conjunctiva ENT: Dry MM Neck:  no LAD, masses or thyromegaly Cardiovascular:  RRR, no m/r/g. 2+ bilateral lower extremity edema and trace upper extremity edema. Telemetry:  SR, no arrhythmias  Respiratory: Rhonchi and wheezes in all lung fields, increased effort, on 4 L nasal cannula Abdomen:  soft, ntnd Skin:  no rash or induration seen on limited exam Musculoskeletal:  grossly normal tone BUE/BLE Psychiatric:  grossly normal mood and affect, speech fluent and appropriate Neurologic:  grossly non-focal.          Labs on Admission:  Basic Metabolic Panel:  Recent Labs Lab 10/03/14 1736  NA 142  K 4.3  CL 109  CO2 27  GLUCOSE 122*  BUN 30*  CREATININE 1.56*  CALCIUM 8.1*   Liver Function Tests: No results for input(s): AST, ALT, ALKPHOS, BILITOT, PROT, ALBUMIN in the last 168 hours. No results for input(s): LIPASE, AMYLASE in the last 168 hours. No results for input(s): AMMONIA in the last 168  hours. CBC:  Recent Labs Lab 10/03/14 1736  WBC 4.7  NEUTROABS 3.5  HGB 13.4  HCT 41.5  MCV 104.3*  PLT 89*   Cardiac Enzymes: No results for input(s): CKTOTAL, CKMB, CKMBINDEX, TROPONINI in the last 168 hours.  BNP (last 3 results) No results for input(s): BNP in the last 8760 hours.  ProBNP (last 3 results)  Recent Labs  12/13/13 1912 03/18/14 1249  PROBNP 334.9* 209.9*    CBG: No results for input(s): GLUCAP in the last 168 hours.  Radiological Exams on Admission: Dg Chest Portable 1 View  10/03/2014   CLINICAL DATA:  Shortness of breath, bronchitis, COPD.  EXAM: PORTABLE CHEST - 1 VIEW  COMPARISON:  08/28/2016  FINDINGS: There is cardiomegaly. No confluent airspace opacity or effusion. No edema. No acute  bony abnormality.  IMPRESSION: Mild cardiomegaly.  No active disease.   Electronically Signed   By: Rolm Baptise M.D.   On: 10/03/2014 18:09    EKG: Independently reviewed. Sinus, low voltage, no ACS  Assessment/Plan Principal Problem:   Acute respiratory failure with hypoxia Active Problems:   COPD exacerbation   Cirrhosis   Essential hypertension   Insomnia   Chronic pain   GERD without esophagitis   Acute respiratory failure: secondary to COPD exacerbation. Increased O2 requirement, increased respiratory effort. Currently on 10 mg of prednisone as outpatient. - MedSurg - O2 when necessary - ABG - Duonebs Q 4hrs - Solu-Medrol 60 mg every 6 hours - Doxycycline  CKD: Creatinine 1.56. Baseline 1.6. Anticipate slight elevation as patient diuresis - BMP in the morning  Hypertension: - Continue metoprolol  Lower extremity edema: Above baseline - Increase Lasix to 40 mg IV - Continue Spironolactone  Cirrhosis: - Continue Lasix (as above), spironolactone, Xifaxan  INsomnia:  - Continue trazodone  GERD: - Continue PPI  Chronic pain: - Continue home oxycodone,   Depression: - Continue Celexa and Abilify  Code Status: FULL DVT  Prophylaxis: Hep Family Communication: None Disposition Plan: pending improvement  MERRELL, DAVID Lenna Sciara, MD Family Medicine [facility] [email or web address] Password [email or web address]

## 2014-10-03 NOTE — ED Notes (Signed)
MD Merrell at bedside. 

## 2014-10-03 NOTE — ED Notes (Signed)
Pt from [place] called EMS for SOB x 3-4 days. Pt has hx of COPD. Per EMS, pt wheezing in all lung fields, dyspnea at rest. Pt given two nebulizer tx PTA. Pt AO x4 in NAD.

## 2014-10-04 LAB — CBC
HEMATOCRIT: 39.4 % (ref 39.0–52.0)
Hemoglobin: 12.7 g/dL — ABNORMAL LOW (ref 13.0–17.0)
MCH: 33.5 pg (ref 26.0–34.0)
MCHC: 32.2 g/dL (ref 30.0–36.0)
MCV: 104 fL — ABNORMAL HIGH (ref 78.0–100.0)
Platelets: 94 10*3/uL — ABNORMAL LOW (ref 150–400)
RBC: 3.79 MIL/uL — ABNORMAL LOW (ref 4.22–5.81)
RDW: 14.5 % (ref 11.5–15.5)
WBC: 4.8 10*3/uL (ref 4.0–10.5)

## 2014-10-04 LAB — COMPREHENSIVE METABOLIC PANEL
ALT: 24 U/L (ref 17–63)
AST: 21 U/L (ref 15–41)
Albumin: 3.4 g/dL — ABNORMAL LOW (ref 3.5–5.0)
Alkaline Phosphatase: 60 U/L (ref 38–126)
Anion gap: 10 (ref 5–15)
BUN: 27 mg/dL — ABNORMAL HIGH (ref 6–20)
CO2: 25 mmol/L (ref 22–32)
Calcium: 8.1 mg/dL — ABNORMAL LOW (ref 8.9–10.3)
Chloride: 104 mmol/L (ref 101–111)
Creatinine, Ser: 1.35 mg/dL — ABNORMAL HIGH (ref 0.61–1.24)
GFR calc Af Amer: >60 mL/min (ref >60)
GFR calc non Af Amer: 60 mL/min — ABNORMAL LOW (ref >60)
GLUCOSE: 179 mg/dL — AB (ref 65–99)
Potassium: 4.5 mmol/L (ref 3.5–5.1)
SODIUM: 139 mmol/L (ref 135–145)
TOTAL PROTEIN: 6.3 g/dL — AB (ref 6.5–8.1)
Total Bilirubin: 0.6 mg/dL (ref 0.3–1.2)

## 2014-10-04 LAB — MRSA PCR SCREENING: MRSA by PCR: NEGATIVE

## 2014-10-04 MED ORDER — CETYLPYRIDINIUM CHLORIDE 0.05 % MT LIQD
7.0000 mL | Freq: Two times a day (BID) | OROMUCOSAL | Status: DC
  Administered 2014-10-04 – 2014-10-06 (×6): 7 mL via OROMUCOSAL

## 2014-10-04 NOTE — Progress Notes (Signed)
Subjective: Patient was admitted yesterday due to respiratory failure secondary to COPD. He continue to smoke tobacco. No fever or chills.  Objective: Vital signs in last 24 hours: Temp:  [97.4 F (36.3 C)-97.9 F (36.6 C)] 97.4 F (36.3 C) (05/22 0500) Pulse Rate:  [71-90] 89 (05/22 0500) Resp:  [17-40] 26 (05/22 0500) BP: (100-134)/(46-105) 121/52 mmHg (05/22 0500) SpO2:  [93 %-100 %] 97 % (05/22 0500) Weight:  [157.5 kg (347 lb 3.6 oz)] 157.5 kg (347 lb 3.6 oz) (05/21 2209) Weight change:  Last BM Date: 10/03/14  Intake/Output from previous day: 05/21 0701 - 05/22 0700 In: -  Out: 575 [Urine:575]  PHYSICAL EXAM General appearance: alert and no distress Resp: diminished breath sounds bilaterally and wheezes bilaterally Cardio: S1, S2 normal GI: soft, non-tender; bowel sounds normal; no masses,  no organomegaly Extremities: extremities normal, atraumatic, no cyanosis or edema  Lab Results:  Results for orders placed or performed during the hospital encounter of 10/03/14 (from the past 48 hour(s))  Basic metabolic panel     Status: Abnormal   Collection Time: 10/03/14  5:36 PM  Result Value Ref Range   Sodium 142 135 - 145 mmol/L   Potassium 4.3 3.5 - 5.1 mmol/L   Chloride 109 101 - 111 mmol/L   CO2 27 22 - 32 mmol/L   Glucose, Bld 122 (H) 65 - 99 mg/dL   BUN 30 (H) 6 - 20 mg/dL   Creatinine, Ser 1.56 (H) 0.61 - 1.24 mg/dL   Calcium 8.1 (L) 8.9 - 10.3 mg/dL   GFR calc non Af Amer 50 (L) >60 mL/min   GFR calc Af Amer 58 (L) >60 mL/min    Comment: (NOTE) The eGFR has been calculated using the CKD EPI equation. This calculation has not been validated in all clinical situations. eGFR's persistently <60 mL/min signify possible Chronic Kidney Disease.    Anion gap 6 5 - 15  CBC with Differential/Platelet     Status: Abnormal   Collection Time: 10/03/14  5:36 PM  Result Value Ref Range   WBC 4.7 4.0 - 10.5 K/uL   RBC 3.98 (L) 4.22 - 5.81 MIL/uL   Hemoglobin 13.4 13.0  - 17.0 g/dL   HCT 41.5 39.0 - 52.0 %   MCV 104.3 (H) 78.0 - 100.0 fL   MCH 33.7 26.0 - 34.0 pg   MCHC 32.3 30.0 - 36.0 g/dL   RDW 14.6 11.5 - 15.5 %   Platelets 89 (L) 150 - 400 K/uL    Comment: SPECIMEN CHECKED FOR CLOTS CONSISTENT WITH PREVIOUS RESULT    Neutrophils Relative % 76 43 - 77 %   Neutro Abs 3.5 1.7 - 7.7 K/uL   Lymphocytes Relative 19 12 - 46 %   Lymphs Abs 0.9 0.7 - 4.0 K/uL   Monocytes Relative 5 3 - 12 %   Monocytes Absolute 0.2 0.1 - 1.0 K/uL   Eosinophils Relative 0 0 - 5 %   Eosinophils Absolute 0.0 0.0 - 0.7 K/uL   Basophils Relative 0 0 - 1 %   Basophils Absolute 0.0 0.0 - 0.1 K/uL  Blood gas, arterial     Status: Abnormal   Collection Time: 10/03/14  9:24 PM  Result Value Ref Range   FIO2 0.28 %   Delivery systems NASAL CANNULA    pH, Arterial 7.347 (L) 7.350 - 7.450   pCO2 arterial 49.7 (H) 35.0 - 45.0 mmHg   pO2, Arterial 72.8 (L) 80.0 - 100.0 mmHg   Bicarbonate 26.6 (  H) 20.0 - 24.0 mEq/L   TCO2 23.7 0 - 100 mmol/L   Acid-Base Excess 1.5 0.0 - 2.0 mmol/L   O2 Saturation 94.1 %   Patient temperature 37.0    Collection site RIGHT RADIAL    Drawn by [id number]    Sample type ARTERIAL DRAW    Allens test (pass/fail) PASS PASS  MRSA PCR Screening     Status: None   Collection Time: 10/04/14  2:00 AM  Result Value Ref Range   MRSA by PCR NEGATIVE NEGATIVE    Comment:        The GeneXpert MRSA Assay (FDA approved for NASAL specimens only), is one component of a comprehensive MRSA colonization surveillance program. It is not intended to diagnose MRSA infection nor to guide or monitor treatment for MRSA infections.   CBC     Status: Abnormal   Collection Time: 10/04/14  6:34 AM  Result Value Ref Range   WBC 4.8 4.0 - 10.5 K/uL   RBC 3.79 (L) 4.22 - 5.81 MIL/uL   Hemoglobin 12.7 (L) 13.0 - 17.0 g/dL   HCT 39.4 39.0 - 52.0 %   MCV 104.0 (H) 78.0 - 100.0 fL   MCH 33.5 26.0 - 34.0 pg   MCHC 32.2 30.0 - 36.0 g/dL   RDW 14.5 11.5 - 15.5 %    Platelets 94 (L) 150 - 400 K/uL    Comment: SPECIMEN CHECKED FOR CLOTS CONSISTENT WITH PREVIOUS RESULT     ABGS  Recent Labs  10/03/14 2124  PHART 7.347*  PO2ART 72.8*  TCO2 23.7  HCO3 26.6*   CULTURES Recent Results (from the past 240 hour(s))  MRSA PCR Screening     Status: None   Collection Time: 10/04/14  2:00 AM  Result Value Ref Range Status   MRSA by PCR NEGATIVE NEGATIVE Final    Comment:        The GeneXpert MRSA Assay (FDA approved for NASAL specimens only), is one component of a comprehensive MRSA colonization surveillance program. It is not intended to diagnose MRSA infection nor to guide or monitor treatment for MRSA infections.    Studies/Results: Dg Chest Portable 1 View  10/03/2014   CLINICAL DATA:  Shortness of breath, bronchitis, COPD.  EXAM: PORTABLE CHEST - 1 VIEW  COMPARISON:  08/28/2016  FINDINGS: There is cardiomegaly. No confluent airspace opacity or effusion. No edema. No acute bony abnormality.  IMPRESSION: Mild cardiomegaly.  No active disease.   Electronically Signed   By: Rolm Baptise M.D.   On: 10/03/2014 18:09    Medications: I have reviewed the patient's current medications.  Assesment:   Principal Problem:   Acute respiratory failure with hypoxia Active Problems:   COPD exacerbation   Cirrhosis   Essential hypertension   Insomnia   Chronic pain   GERD without esophagitis    Plan:  Medications reviewed Continue nebulizer treatment  And IV solumedrol Will do pulmonary consult Continue regular treatment    LOS: 1 day   Justeen Hehr 10/04/2014, 8:00 AM

## 2014-10-05 NOTE — Clinical Social Work Note (Signed)
Clinical Social Work Assessment  Patient Details  Name: Jorge [name] [facility] MRN: [id number] Date of Birth: Dec 11, 1965  Date of referral:  10/05/14               Reason for consult:  Facility Placement                Permission sought to share information with:    Permission granted to share information::     Name::        Agency::     Relationship::     Contact Information:     Housing/Transportation Living arrangements for the past 2 months:  [place] of Information:  Patient Patient Interpreter Needed:  None Criminal Activity/Legal Involvement Pertinent to Current Situation/Hospitalization:  No - Comment as needed Significant Relationships:  Siblings Lives with:  Facility Resident Do you feel safe going back to the place where you live?  Yes Need for family participation in patient care:  No (Coment) (Pt manages everything on his own. )  Care giving concerns:  Pt is long term resident at [facility].    Social Worker assessment / plan:  CSW met with pt at bedside. Pt alert and oriented and reports he has been a resident at [facility] for about 4 years. He states his twin brother, Jorge [name] is his best support and visits some at the facility. Pt said that it was difficult to adjust to being in a facility at first, but he now sees [facility] as "home" and gets along well with everyone there. Pt is fairly independent with ADLs. He plans to return to [facility] at d/c. [place] left voicemail with Willey requesting return call.   Employment status:  Disabled (Comment on whether or not currently receiving Disability) Insurance information:  Medicare PT Recommendations:  Not assessed at this time Information / Referral to community resources:  Other (Comment Required) (Return to [facility])  Patient/Family's Response to care:  Pt requests to return to [facility] at d/c.   Patient/Family's Understanding of and Emotional Response to Diagnosis,  Current Treatment, and Prognosis:  Pt understands diagnosis and need for continued hospital stay, but is eager to d/c when ready.   Emotional Assessment Appearance:  Appears older than stated age Attitude/Demeanor/Rapport:  Other (Cooperative) Affect (typically observed):  Pleasant, Appropriate Orientation:  Oriented to Self, Oriented to Place, Oriented to  Time, Oriented to Situation Alcohol / Substance use:  Not Applicable Psych involvement (Current and /or in the community):  No (Comment)  Discharge Needs  Concerns to be addressed:  Discharge Planning Concerns Readmission within the last 30 days:  No Current discharge risk:  None Barriers to Discharge:  Continued Medical Work up   [employer], [employer], [place] 10/05/2014, 8:44 AM [phone]

## 2014-10-05 NOTE — Progress Notes (Signed)
Subjective: Patient feels slightly better today. His breathing is improving. No fever or chills. Objective: Vital signs in last 24 hours: Temp:  [97.8 F (36.6 C)-98.2 F (36.8 C)] 97.8 F (36.6 C) (05/23 0604) Pulse Rate:  [66] 66 (05/23 0604) Resp:  [16-22] 22 (05/23 0604) BP: (127-145)/(68-74) 127/68 mmHg (05/23 0604) SpO2:  [92 %-97 %] 96 % (05/23 0604) Weight change:  Last BM Date: 10/03/14  Intake/Output from previous day: 05/22 0701 - 05/23 0700 In: 363 [P.O.:360; I.V.:3] Out: 2100 [Urine:2100]  PHYSICAL EXAM General appearance: alert and no distress Resp: diminished breath sounds bilaterally and wheezes bilaterally Cardio: S1, S2 normal GI: soft, non-tender; bowel sounds normal; no masses,  no organomegaly Extremities: extremities normal, atraumatic, no cyanosis or edema  Lab Results:  Results for orders placed or performed during the hospital encounter of 10/03/14 (from the past 48 hour(s))  Basic metabolic panel     Status: Abnormal   Collection Time: 10/03/14  5:36 PM  Result Value Ref Range   Sodium 142 135 - 145 mmol/L   Potassium 4.3 3.5 - 5.1 mmol/L   Chloride 109 101 - 111 mmol/L   CO2 27 22 - 32 mmol/L   Glucose, Bld 122 (H) 65 - 99 mg/dL   BUN 30 (H) 6 - 20 mg/dL   Creatinine, Ser 1.56 (H) 0.61 - 1.24 mg/dL   Calcium 8.1 (L) 8.9 - 10.3 mg/dL   GFR calc non Af Amer 50 (L) >60 mL/min   GFR calc Af Amer 58 (L) >60 mL/min    Comment: (NOTE) The eGFR has been calculated using the CKD EPI equation. This calculation has not been validated in all clinical situations. eGFR's persistently <60 mL/min signify possible Chronic Kidney Disease.    Anion gap 6 5 - 15  CBC with Differential/Platelet     Status: Abnormal   Collection Time: 10/03/14  5:36 PM  Result Value Ref Range   WBC 4.7 4.0 - 10.5 K/uL   RBC 3.98 (L) 4.22 - 5.81 MIL/uL   Hemoglobin 13.4 13.0 - 17.0 g/dL   HCT 41.5 39.0 - 52.0 %   MCV 104.3 (H) 78.0 - 100.0 fL   MCH 33.7 26.0 - 34.0 pg    MCHC 32.3 30.0 - 36.0 g/dL   RDW 14.6 11.5 - 15.5 %   Platelets 89 (L) 150 - 400 K/uL    Comment: SPECIMEN CHECKED FOR CLOTS CONSISTENT WITH PREVIOUS RESULT    Neutrophils Relative % 76 43 - 77 %   Neutro Abs 3.5 1.7 - 7.7 K/uL   Lymphocytes Relative 19 12 - 46 %   Lymphs Abs 0.9 0.7 - 4.0 K/uL   Monocytes Relative 5 3 - 12 %   Monocytes Absolute 0.2 0.1 - 1.0 K/uL   Eosinophils Relative 0 0 - 5 %   Eosinophils Absolute 0.0 0.0 - 0.7 K/uL   Basophils Relative 0 0 - 1 %   Basophils Absolute 0.0 0.0 - 0.1 K/uL  Blood gas, arterial     Status: Abnormal   Collection Time: 10/03/14  9:24 PM  Result Value Ref Range   FIO2 0.28 %   Delivery systems NASAL CANNULA    pH, Arterial 7.347 (L) 7.350 - 7.450   pCO2 arterial 49.7 (H) 35.0 - 45.0 mmHg   pO2, Arterial 72.8 (L) 80.0 - 100.0 mmHg   Bicarbonate 26.6 (H) 20.0 - 24.0 mEq/L   TCO2 23.7 0 - 100 mmol/L   Acid-Base Excess 1.5 0.0 - 2.0 mmol/L  O2 Saturation 94.1 %   Patient temperature 37.0    Collection site RIGHT RADIAL    Drawn by [id number]    Sample type ARTERIAL DRAW    Allens test (pass/fail) PASS PASS  MRSA PCR Screening     Status: None   Collection Time: 10/04/14  2:00 AM  Result Value Ref Range   MRSA by PCR NEGATIVE NEGATIVE    Comment:        The GeneXpert MRSA Assay (FDA approved for NASAL specimens only), is one component of a comprehensive MRSA colonization surveillance program. It is not intended to diagnose MRSA infection nor to guide or monitor treatment for MRSA infections.   CBC     Status: Abnormal   Collection Time: 10/04/14  6:34 AM  Result Value Ref Range   WBC 4.8 4.0 - 10.5 K/uL   RBC 3.79 (L) 4.22 - 5.81 MIL/uL   Hemoglobin 12.7 (L) 13.0 - 17.0 g/dL   HCT 39.4 39.0 - 52.0 %   MCV 104.0 (H) 78.0 - 100.0 fL   MCH 33.5 26.0 - 34.0 pg   MCHC 32.2 30.0 - 36.0 g/dL   RDW 14.5 11.5 - 15.5 %   Platelets 94 (L) 150 - 400 K/uL    Comment: SPECIMEN CHECKED FOR CLOTS CONSISTENT WITH PREVIOUS RESULT    Comprehensive metabolic panel     Status: Abnormal   Collection Time: 10/04/14  6:34 AM  Result Value Ref Range   Sodium 139 135 - 145 mmol/L   Potassium 4.5 3.5 - 5.1 mmol/L   Chloride 104 101 - 111 mmol/L   CO2 25 22 - 32 mmol/L   Glucose, Bld 179 (H) 65 - 99 mg/dL   BUN 27 (H) 6 - 20 mg/dL   Creatinine, Ser 1.35 (H) 0.61 - 1.24 mg/dL   Calcium 8.1 (L) 8.9 - 10.3 mg/dL   Total Protein 6.3 (L) 6.5 - 8.1 g/dL   Albumin 3.4 (L) 3.5 - 5.0 g/dL   AST 21 15 - 41 U/L   ALT 24 17 - 63 U/L   Alkaline Phosphatase 60 38 - 126 U/L   Total Bilirubin 0.6 0.3 - 1.2 mg/dL   GFR calc non Af Amer 60 (L) >60 mL/min   GFR calc Af Amer >60 >60 mL/min    Comment: (NOTE) The eGFR has been calculated using the CKD EPI equation. This calculation has not been validated in all clinical situations. eGFR's persistently <60 mL/min signify possible Chronic Kidney Disease.    Anion gap 10 5 - 15    ABGS  Recent Labs  10/03/14 2124  PHART 7.347*  PO2ART 72.8*  TCO2 23.7  HCO3 26.6*   CULTURES Recent Results (from the past 240 hour(s))  MRSA PCR Screening     Status: None   Collection Time: 10/04/14  2:00 AM  Result Value Ref Range Status   MRSA by PCR NEGATIVE NEGATIVE Final    Comment:        The GeneXpert MRSA Assay (FDA approved for NASAL specimens only), is one component of a comprehensive MRSA colonization surveillance program. It is not intended to diagnose MRSA infection nor to guide or monitor treatment for MRSA infections.    Studies/Results: Dg Chest Portable 1 View  10/03/2014   CLINICAL DATA:  Shortness of breath, bronchitis, COPD.  EXAM: PORTABLE CHEST - 1 VIEW  COMPARISON:  08/28/2016  FINDINGS: There is cardiomegaly. No confluent airspace opacity or effusion. No edema. No acute bony abnormality.  IMPRESSION: Mild  cardiomegaly.  No active disease.   Electronically Signed   By: Rolm Baptise M.D.   On: 10/03/2014 18:09    Medications: I have reviewed the patient's current  medications.  Assesment:   Principal Problem:   Acute respiratory failure with hypoxia Active Problems:   COPD exacerbation   Cirrhosis   Essential hypertension   Insomnia   Chronic pain   GERD without esophagitis    Plan:  Medications reviewed Continue nebulizer treatment  And IV solumedrol  pulmonary consult Continue regular treatment    LOS: 2 days   Aviela Blundell 10/05/2014, 7:23 AM

## 2014-10-05 NOTE — Progress Notes (Signed)
UR chart review completed.  

## 2014-10-05 NOTE — Care Management Note (Signed)
Case Management Note  Patient Details  Name: Jadarian Mckay [facility] MRN: [id number] Date of Birth: 20-Dec-1965  Subjective/Objective:                  Pt admitted from [facility] with COPD exacerbation. Anticipate pt will discharge back to facility when medically stable.  Action/Plan: CSW is aware and will arrange discharge to facility. Will need to assess need for home O2 prior to discharge.  Expected Discharge Date:  10/06/14               Expected Discharge Plan:  Assisted Living / Rest Home  In-House Referral:  Clinical Social Work  Discharge planning Services  CM Consult  Post Acute Care Choice:    Choice offered to:     DME Arranged:    DME Agency:     HH Arranged:    [place] Agency:     Status of Service:  Completed, signed off  Medicare Important Message Given:    Date Medicare IM Given:    Medicare IM give by:    Date Additional Medicare IM Given:    Additional Medicare Important Message give by:     If discussed at [place] of Stay Meetings, dates discussed:    Additional Comments:  Joylene Draft, RN 10/05/2014, 10:19 AM

## 2014-10-06 MED ORDER — METHYLPREDNISOLONE SODIUM SUCC 125 MG IJ SOLR
60.0000 mg | Freq: Two times a day (BID) | INTRAMUSCULAR | Status: DC
  Administered 2014-10-06 – 2014-10-07 (×2): 60 mg via INTRAVENOUS
  Filled 2014-10-06 (×2): qty 2

## 2014-10-06 MED ORDER — CETYLPYRIDINIUM CHLORIDE 0.05 % MT LIQD
7.0000 mL | Freq: Two times a day (BID) | OROMUCOSAL | Status: DC
  Administered 2014-10-07: 7 mL via OROMUCOSAL

## 2014-10-06 MED ORDER — IPRATROPIUM-ALBUTEROL 0.5-2.5 (3) MG/3ML IN SOLN
3.0000 mL | Freq: Four times a day (QID) | RESPIRATORY_TRACT | Status: DC
  Administered 2014-10-06 – 2014-10-07 (×5): 3 mL via RESPIRATORY_TRACT
  Filled 2014-10-06 (×5): qty 3

## 2014-10-06 NOTE — Clinical Social Work Note (Signed)
CSW spoke with Rise Paganini at [employer] [facility]  who confirms okay for pt to return at d/c. No home health prior to admission.  Jorge [name], [place]

## 2014-10-06 NOTE — Consult Note (Signed)
Consult requested by: Dr. Legrand Rams Consult requested for COPD exacerbation:  HPI: This is a 49 year old who is known to have COPD which is pretty severe and who had recently been in the hospital COPD exacerbation. He went home and initially did well then he came back with increasing shortness of breath cough and congestion. He has improved but is still wheezing and still short of breath. He is coughing up white sputum. He did not have any fever at home.  Past Medical History  Diagnosis Date  . COPD (chronic obstructive pulmonary disease)   . Cirrhosis   . Hypertension      Family History  Problem Relation Age of Onset  . Heart disease Father   . Cancer Mother     breast     History   Social History  . Marital Status: Single    Spouse Name: N/A  . Number of Children: N/A  . Years of Education: N/A   Social History Main Topics  . Smoking status: Current Every Day Smoker -- 0.50 packs/day    Types: Cigarettes  . Smokeless tobacco: Not on file  . Alcohol Use: No  . Drug Use: No  . Sexual Activity: Not on file   Other Topics Concern  . None   Social History Narrative     ROS: He's not had any chest pain or hemoptysis. No nausea vomiting or diarrhea. The rest per the history and physical    Objective: Vital signs in last 24 hours: Temp:  [97.5 F (36.4 C)-97.7 F (36.5 C)] 97.7 F (36.5 C) (05/24 0521) Pulse Rate:  [60-73] 60 (05/24 0521) Resp:  [18-20] 20 (05/24 0521) BP: (124-136)/(69-78) 124/72 mmHg (05/24 0521) SpO2:  [94 %-100 %] 98 % (05/24 0758) Weight change:  Last BM Date: 10/03/14  Intake/Output from previous day: 05/23 0701 - 05/24 0700 In: 963 [P.O.:960; I.V.:3] Out: 4200 [Urine:4200]  PHYSICAL EXAM He is awake and alert. He is in mild distress. He looks a little short of breath at rest. His HEENT examination is unremarkable. His neck is supple without masses. His chest shows bilateral wheezes anteriorly and posteriorly. His heart is regular  without gallop. His abdomen is soft with no masses. He does not have any edema. Central nervous system exam is grossly intact  Lab Results: Basic Metabolic Panel:  Recent Labs  10/03/14 1736 10/04/14 0634  NA 142 139  K 4.3 4.5  CL 109 104  CO2 27 25  GLUCOSE 122* 179*  BUN 30* 27*  CREATININE 1.56* 1.35*  CALCIUM 8.1* 8.1*   Liver Function Tests:  Recent Labs  10/04/14 0634  AST 21  ALT 24  ALKPHOS 60  BILITOT 0.6  PROT 6.3*  ALBUMIN 3.4*   No results for input(s): LIPASE, AMYLASE in the last 72 hours. No results for input(s): AMMONIA in the last 72 hours. CBC:  Recent Labs  10/03/14 1736 10/04/14 0634  WBC 4.7 4.8  NEUTROABS 3.5  --   HGB 13.4 12.7*  HCT 41.5 39.4  MCV 104.3* 104.0*  PLT 89* 94*   Cardiac Enzymes: No results for input(s): CKTOTAL, CKMB, CKMBINDEX, TROPONINI in the last 72 hours. BNP: No results for input(s): PROBNP in the last 72 hours. D-Dimer: No results for input(s): DDIMER in the last 72 hours. CBG: No results for input(s): GLUCAP in the last 72 hours. Hemoglobin A1C: No results for input(s): HGBA1C in the last 72 hours. Fasting Lipid Panel: No results for input(s): CHOL, HDL, LDLCALC, TRIG, CHOLHDL, LDLDIRECT  in the last 72 hours. Thyroid Function Tests: No results for input(s): TSH, T4TOTAL, FREET4, T3FREE, THYROIDAB in the last 72 hours. Anemia Panel: No results for input(s): VITAMINB12, FOLATE, FERRITIN, TIBC, IRON, RETICCTPCT in the last 72 hours. Coagulation: No results for input(s): LABPROT, INR in the last 72 hours. Urine Drug Screen: Drugs of Abuse  No results found for: LABOPIA, COCAINSCRNUR, LABBENZ, AMPHETMU, THCU, LABBARB  Alcohol Level: No results for input(s): ETH in the last 72 hours. Urinalysis: No results for input(s): COLORURINE, LABSPEC, PHURINE, GLUCOSEU, HGBUR, BILIRUBINUR, KETONESUR, PROTEINUR, UROBILINOGEN, NITRITE, LEUKOCYTESUR in the last 72 hours.  Invalid input(s): APPERANCEUR Misc.  Labs:   ABGS:  Recent Labs  10/03/14 2124  PHART 7.347*  PO2ART 72.8*  TCO2 23.7  HCO3 26.6*     MICROBIOLOGY: Recent Results (from the past 240 hour(s))  MRSA PCR Screening     Status: None   Collection Time: 10/04/14  2:00 AM  Result Value Ref Range Status   MRSA by PCR NEGATIVE NEGATIVE Final    Comment:        The GeneXpert MRSA Assay (FDA approved for NASAL specimens only), is one component of a comprehensive MRSA colonization surveillance program. It is not intended to diagnose MRSA infection nor to guide or monitor treatment for MRSA infections.     Studies/Results: No results found.  Medications:  Prior to Admission:  Prescriptions prior to admission  Medication Sig Dispense Refill Last Dose  . ARIPiprazole (ABILIFY) 30 MG tablet Take 30 mg by mouth at bedtime.    10/02/2014 at Unknown time  . citalopram (CELEXA) 40 MG tablet Take 40 mg by mouth at bedtime.    10/02/2014 at Unknown time  . dextromethorphan (DELSYM) 30 MG/5ML liquid Take 30 mg by mouth every 12 (twelve) hours.   10/03/2014 at Unknown time  . furosemide (LASIX) 40 MG tablet Take 40 mg by mouth daily.   10/03/2014 at Unknown time  . gabapentin (NEURONTIN) 300 MG capsule Take 300 mg by mouth 3 (three) times daily.   10/03/2014 at Unknown time  . metoprolol succinate (TOPROL-XL) 50 MG 24 hr tablet Take 50 mg by mouth daily. Take with or immediately following a meal.   10/03/2014 at 800a  . omeprazole (PRILOSEC) 20 MG capsule Take 20 mg by mouth daily.   10/03/2014 at Unknown time  . oxyCODONE (OXY IR/ROXICODONE) 5 MG immediate release tablet Take 5 mg by mouth every 6 (six) hours as needed for severe pain.    Past Week at Unknown time  . PROAIR HFA 108 (90 BASE) MCG/ACT inhaler Inhale 2 puffs into the lungs every 4 (four) hours as needed. Shortness of breath/wheeze   10/03/2014 at Unknown time  . spironolactone (ALDACTONE) 25 MG tablet Take 25 mg by mouth 2 (two) times daily.   10/03/2014 at Unknown time   . SYMBICORT 160-4.5 MCG/ACT inhaler Inhale 1 puff into the lungs 2 (two) times daily.   10/03/2014 at Unknown time  . Tiotropium Bromide Monohydrate 2.5 MCG/ACT AERS Inhale 2 puffs into the lungs daily.   10/03/2014 at Unknown time  . traZODone (DESYREL) 150 MG tablet Take 300 mg by mouth at bedtime.    10/02/2014 at Unknown time  . vitamin B-12 (CYANOCOBALAMIN) 500 MCG tablet Take 500 mcg by mouth daily.   10/03/2014 at Unknown time  . XIFAXAN 550 MG TABS tablet Take 550 mg by mouth 2 (two) times daily.   10/03/2014 at Unknown time   Scheduled: . antiseptic oral rinse  7  mL Mouth Rinse BID  . ARIPiprazole  30 mg Oral QHS  . budesonide-formoterol  1 puff Inhalation BID  . citalopram  40 mg Oral QHS  . doxycycline  100 mg Oral Q12H  . furosemide  40 mg Intravenous BID  . gabapentin  300 mg Oral TID  . heparin  5,000 Units Subcutaneous 3 times per day  . ipratropium-albuterol  3 mL Nebulization Q6H  . methylPREDNISolone (SOLU-MEDROL) injection  60 mg Intravenous Q12H  . metoprolol succinate  50 mg Oral Daily  . pantoprazole  40 mg Oral Daily  . rifaximin  550 mg Oral BID  . sodium chloride  3 mL Intravenous Q12H  . spironolactone  25 mg Oral BID  . traZODone  300 mg Oral QHS   Continuous:  GQQ:PYPPJK chloride, acetaminophen **OR** acetaminophen, ondansetron **OR** ondansetron (ZOFRAN) IV, oxyCODONE, sodium chloride  Assesment: He has acute hypoxic respiratory failure and is improving. He has COPD exacerbation. Chest x-ray does not show pneumonia. He is on appropriate treatment with antibiotic inhale bronchodilators and steroids. He is on appropriate outpatient therapy as well. Principal Problem:   Acute respiratory failure with hypoxia Active Problems:   COPD exacerbation   Cirrhosis   Essential hypertension   Insomnia   Chronic pain   GERD without esophagitis    Plan: Continue current treatments. I think he will continue to improve on current regimen    LOS: 3 days    Corran Lalone L 10/06/2014, 8:21 AM

## 2014-10-06 NOTE — Progress Notes (Signed)
Patient ambulated in hallway from his room to nurses station and around back hallway back to room. O2 sat 94% on r/a while sitting before ambulation. O2 sats maintained 88-93% on r/a while ambulating. Tolerated well, few complaints of dyspnea. O2 sats 93% on r/a while sitting on return to room. O2 sats 97% on 2 lpm at rest. Donavan Foil, RN

## 2014-10-06 NOTE — Progress Notes (Signed)
Subjective: Patient improving but still cough and wheezing. No chest pain,nause or vomiting. Objective: Vital signs in last 24 hours: Temp:  [97.5 F (36.4 C)-97.7 F (36.5 C)] 97.7 F (36.5 C) (05/24 0521) Pulse Rate:  [60-73] 60 (05/24 0521) Resp:  [18-20] 20 (05/24 0521) BP: (124-136)/(69-78) 124/72 mmHg (05/24 0521) SpO2:  [94 %-100 %] 98 % (05/24 0758) Weight change:  Last BM Date: 10/03/14  Intake/Output from previous day: 05/23 0701 - 05/24 0700 In: 963 [P.O.:960; I.V.:3] Out: 4200 [Urine:4200]  PHYSICAL EXAM General appearance: alert and no distress Resp: diminished breath sounds bilaterally and wheezes bilaterally Cardio: S1, S2 normal GI: soft, non-tender; bowel sounds normal; no masses,  no organomegaly Extremities: extremities normal, atraumatic, no cyanosis or edema  Lab Results:  No results found for this or any previous visit (from the past 48 hour(s)).  ABGS  Recent Labs  10/03/14 2124  PHART 7.347*  PO2ART 72.8*  TCO2 23.7  HCO3 26.6*   CULTURES Recent Results (from the past 240 hour(s))  MRSA PCR Screening     Status: None   Collection Time: 10/04/14  2:00 AM  Result Value Ref Range Status   MRSA by PCR NEGATIVE NEGATIVE Final    Comment:        The GeneXpert MRSA Assay (FDA approved for NASAL specimens only), is one component of a comprehensive MRSA colonization surveillance program. It is not intended to diagnose MRSA infection nor to guide or monitor treatment for MRSA infections.    Studies/Results: No results found.  Medications: I have reviewed the patient's current medications.  Assesment:   Principal Problem:   Acute respiratory failure with hypoxia Active Problems:   COPD exacerbation   Cirrhosis   Essential hypertension   Insomnia   Chronic pain   GERD without esophagitis    Plan:  Medications reviewed Continue nebulizer treatment   Will taper steroid Continue regular treatment.    LOS: 3 days    Jorge [name] 10/06/2014, 8:10 AM

## 2014-10-07 MED ORDER — PREDNISONE 10 MG (21) PO TBPK: 10.0000 mg | ORAL_TABLET | Freq: Every day | ORAL | Status: DC

## 2014-10-07 NOTE — Progress Notes (Signed)
UR chart review completed.  

## 2014-10-07 NOTE — Clinical Social Work Note (Signed)
Pt d/c today back to [facility]. Pt and facility aware and agreeable. FL2 and d/c summary faxed. Pt does not want CSW to call any family regarding d/c.   Jorge [name], [place]

## 2014-10-07 NOTE — Discharge Summary (Signed)
Physician Discharge Summary  Patient ID: Jorge [name] [facility] MRN: [id number] DOB/AGE: [birth year] 49 y.o. Primary Care Physician:Joy Reiger, MD Admit date: 10/03/2014 Discharge date: 10/07/2014    Discharge Diagnoses:     Principal Problem:   Acute respiratory failure with hypoxia Active Problems:   COPD exacerbation   Cirrhosis   Essential hypertension   Insomnia   Chronic pain   GERD without esophagitis     Medication List    TAKE these medications        ARIPiprazole 30 MG tablet  Commonly known as:  ABILIFY  Take 30 mg by mouth at bedtime.     citalopram 40 MG tablet  Commonly known as:  CELEXA  Take 40 mg by mouth at bedtime.     dextromethorphan 30 MG/5ML liquid  Commonly known as:  DELSYM  Take 30 mg by mouth every 12 (twelve) hours.     furosemide 40 MG tablet  Commonly known as:  LASIX  Take 40 mg by mouth daily.     gabapentin 300 MG capsule  Commonly known as:  NEURONTIN  Take 300 mg by mouth 3 (three) times daily.     metoprolol succinate 50 MG 24 hr tablet  Commonly known as:  TOPROL-XL  Take 50 mg by mouth daily. Take with or immediately following a meal.     omeprazole 20 MG capsule  Commonly known as:  PRILOSEC  Take 20 mg by mouth daily.     oxyCODONE 5 MG immediate release tablet  Commonly known as:  Oxy IR/ROXICODONE  Take 5 mg by mouth every 6 (six) hours as needed for severe pain.     predniSONE 10 MG (21) Tbpk tablet  Commonly known as:  STERAPRED UNI-PAK 21 TAB  Take 1 tablet (10 mg total) by mouth daily. 4 tab po daily for 3 days, then 3 tab po daily for 3 days, then 2 tab po daily for 3 days, then 1 tab po daily for 3 days then stop     PROAIR HFA 108 (90 BASE) MCG/ACT inhaler  Generic drug:  albuterol  Inhale 2 puffs into the lungs every 4 (four) hours as needed. Shortness of breath/wheeze     spironolactone 25 MG tablet  Commonly known as:  ALDACTONE  Take 25 mg by mouth 2 (two) times daily.     SYMBICORT 160-4.5 MCG/ACT  inhaler  Generic drug:  budesonide-formoterol  Inhale 1 puff into the lungs 2 (two) times daily.     Tiotropium Bromide Monohydrate 2.5 MCG/ACT Aers  Inhale 2 puffs into the lungs daily.     traZODone 150 MG tablet  Commonly known as:  DESYREL  Take 300 mg by mouth at bedtime.     vitamin B-12 500 MCG tablet  Commonly known as:  CYANOCOBALAMIN  Take 500 mcg by mouth daily.     XIFAXAN 550 MG Tabs tablet  Generic drug:  rifaximin  Take 550 mg by mouth 2 (two) times daily.        Discharged Condition: improved    Consults: pulmonary  Significant Diagnostic Studies: Dg Chest Portable 1 View  10/03/2014   CLINICAL DATA:  Shortness of breath, bronchitis, COPD.  EXAM: PORTABLE CHEST - 1 VIEW  COMPARISON:  08/28/2016  FINDINGS: There is cardiomegaly. No confluent airspace opacity or effusion. No edema. No acute bony abnormality.  IMPRESSION: Mild cardiomegaly.  No active disease.   Electronically Signed   By: Rolm Baptise M.D.   On: 10/03/2014 18:09  Lab Results: Basic Metabolic Panel: No results for input(s): NA, K, CL, CO2, GLUCOSE, BUN, CREATININE, CALCIUM, MG, PHOS in the last 72 hours. Liver Function Tests: No results for input(s): AST, ALT, ALKPHOS, BILITOT, PROT, ALBUMIN in the last 72 hours.   CBC: No results for input(s): WBC, NEUTROABS, HGB, HCT, MCV, PLT in the last 72 hours.  Recent Results (from the past 240 hour(s))  MRSA PCR Screening     Status: None   Collection Time: 10/04/14  2:00 AM  Result Value Ref Range Status   MRSA by PCR NEGATIVE NEGATIVE Final    Comment:        The GeneXpert MRSA Assay (FDA approved for NASAL specimens only), is one component of a comprehensive MRSA colonization surveillance program. It is not intended to diagnose MRSA infection nor to guide or monitor treatment for MRSA infections.      Hospital Course:   This is a 49 years old male patient with history of multiple medical illnesses was admitted due to [place]. Patient was started on iv steroid and nebulizer treatment. Pulmonary consult was also done. Patient gradually improved and will discharged on oral steroid. Strongly advised to stop tobacco smoking.  Discharge Exam: Blood pressure 125/52, pulse 53, temperature 97.5 F (36.4 C), temperature source Oral, resp. rate 20, height 6\' 3"  (1.905 m), weight 157.5 kg (347 lb 3.6 oz), SpO2 89 %.     Disposition:  Assisted living.        Follow-up Information    Follow up with [facility], MD In 1 week.   Specialty:  Internal Medicine   Contact information:   [place] [id number] [phone]       Signed: Rosita Fire   10/07/2014, 7:42 AM

## 2014-10-07 NOTE — Progress Notes (Signed)
SATURATION QUALIFICATIONS: (This note is used to comply with regulatory documentation for home oxygen)  Patient Saturations on Room Air at Rest = 93%  Patient Saturations on Room Air while Ambulating = 90-91%  No need for home O2

## 2014-10-07 NOTE — Progress Notes (Signed)
Patient discharged back to [facility], staff members here to transport home.  IV removed - WNL.  Instructed on new medications - importance of following tapering instructions.  Advised to quit smoking - handout given.  Follow up appt in place with PCP.  No questions at this time.  Packet per SW given to staff.  Left floor via WC in stable condition.

## 2014-10-07 NOTE — Care Management Note (Signed)
Case Management Note  Patient Details  Name: Jorge [name] [facility] MRN: [id number] Date of Birth: [birth year]  Subjective/Objective:                    Action/Plan:   Expected Discharge Date:  10/06/14               Expected Discharge Plan:  Assisted Living / Rest Home  In-House Referral:  Clinical Social Work  Discharge planning Services  CM Consult  Post Acute Care Choice:  NA Choice offered to:  NA  DME Arranged:    DME Agency:     HH Arranged:    [place] Agency:     Status of Service:  Completed, signed off  Medicare Important Message Given:  Yes Date Medicare IM Given:  10/07/14 Medicare IM give by:  Christinia Gully, RN BSN CM Date Additional Medicare IM Given:    Additional Medicare Important Message give by:     If discussed at [place] of Stay Meetings, dates discussed:    Additional Comments: Pt discharged back to [facility] today. CSW to arrange discharge to facility. Christinia Gully [place], RN 10/07/2014, 11:26 AM

## 2014-10-14 DIAGNOSIS — G4733 Obstructive sleep apnea (adult) (pediatric): Secondary | ICD-10-CM | POA: Diagnosis not present

## 2014-10-14 DIAGNOSIS — R0609 Other forms of dyspnea: Secondary | ICD-10-CM | POA: Diagnosis not present

## 2014-10-14 DIAGNOSIS — J439 Emphysema, unspecified: Secondary | ICD-10-CM | POA: Diagnosis not present

## 2014-10-20 DIAGNOSIS — J449 Chronic obstructive pulmonary disease, unspecified: Secondary | ICD-10-CM | POA: Diagnosis not present

## 2014-10-20 DIAGNOSIS — K7460 Unspecified cirrhosis of liver: Secondary | ICD-10-CM | POA: Diagnosis not present

## 2014-10-20 DIAGNOSIS — R0902 Hypoxemia: Secondary | ICD-10-CM | POA: Diagnosis not present

## 2014-10-27 ENCOUNTER — Emergency Department (HOSPITAL_COMMUNITY): Payer: Medicare Other

## 2014-10-27 ENCOUNTER — Encounter (HOSPITAL_COMMUNITY): Payer: Self-pay

## 2014-10-27 ENCOUNTER — Inpatient Hospital Stay (HOSPITAL_COMMUNITY)
Admission: EM | Admit: 2014-10-27 | Discharge: 2014-11-01 | DRG: 190 | Disposition: A | Discharge status: Nursing Home (Includes ICF) | Payer: Medicare Other | Attending: Internal Medicine | Admitting: Internal Medicine

## 2014-10-27 DIAGNOSIS — R739 Hyperglycemia, unspecified: Secondary | ICD-10-CM | POA: Diagnosis present

## 2014-10-27 DIAGNOSIS — R0602 Shortness of breath: Secondary | ICD-10-CM

## 2014-10-27 DIAGNOSIS — Z720 Tobacco use: Secondary | ICD-10-CM

## 2014-10-27 DIAGNOSIS — J441 Chronic obstructive pulmonary disease with (acute) exacerbation: Secondary | ICD-10-CM | POA: Diagnosis present

## 2014-10-27 DIAGNOSIS — K7460 Unspecified cirrhosis of liver: Secondary | ICD-10-CM | POA: Diagnosis present

## 2014-10-27 DIAGNOSIS — D6959 Other secondary thrombocytopenia: Secondary | ICD-10-CM | POA: Diagnosis present

## 2014-10-27 DIAGNOSIS — K219 Gastro-esophageal reflux disease without esophagitis: Secondary | ICD-10-CM | POA: Diagnosis present

## 2014-10-27 DIAGNOSIS — Z8249 Family history of ischemic heart disease and other diseases of the circulatory system: Secondary | ICD-10-CM | POA: Diagnosis not present

## 2014-10-27 DIAGNOSIS — N179 Acute kidney failure, unspecified: Secondary | ICD-10-CM | POA: Diagnosis present

## 2014-10-27 DIAGNOSIS — N183 Chronic kidney disease, stage 3 unspecified: Secondary | ICD-10-CM | POA: Diagnosis present

## 2014-10-27 DIAGNOSIS — N189 Chronic kidney disease, unspecified: Secondary | ICD-10-CM | POA: Diagnosis present

## 2014-10-27 DIAGNOSIS — I10 Essential (primary) hypertension: Secondary | ICD-10-CM | POA: Diagnosis present

## 2014-10-27 DIAGNOSIS — F17210 Nicotine dependence, cigarettes, uncomplicated: Secondary | ICD-10-CM | POA: Diagnosis present

## 2014-10-27 DIAGNOSIS — J449 Chronic obstructive pulmonary disease, unspecified: Secondary | ICD-10-CM | POA: Diagnosis present

## 2014-10-27 DIAGNOSIS — J9612 Chronic respiratory failure with hypercapnia: Secondary | ICD-10-CM | POA: Diagnosis present

## 2014-10-27 DIAGNOSIS — G4730 Sleep apnea, unspecified: Secondary | ICD-10-CM

## 2014-10-27 DIAGNOSIS — J9601 Acute respiratory failure with hypoxia: Secondary | ICD-10-CM | POA: Diagnosis present

## 2014-10-27 DIAGNOSIS — E669 Obesity, unspecified: Secondary | ICD-10-CM | POA: Diagnosis not present

## 2014-10-27 DIAGNOSIS — I129 Hypertensive chronic kidney disease with stage 1 through stage 4 chronic kidney disease, or unspecified chronic kidney disease: Secondary | ICD-10-CM | POA: Diagnosis present

## 2014-10-27 DIAGNOSIS — Z9119 Patient's noncompliance with other medical treatment and regimen: Secondary | ICD-10-CM | POA: Diagnosis present

## 2014-10-27 DIAGNOSIS — D696 Thrombocytopenia, unspecified: Secondary | ICD-10-CM | POA: Diagnosis present

## 2014-10-27 HISTORY — DX: Sleep apnea, unspecified: G47.30

## 2014-10-27 HISTORY — DX: Tobacco use: Z72.0

## 2014-10-27 HISTORY — DX: Disorder of kidney and ureter, unspecified: N28.9

## 2014-10-27 LAB — BRAIN NATRIURETIC PEPTIDE: B Natriuretic Peptide: 136 pg/mL — ABNORMAL HIGH (ref 0.0–100.0)

## 2014-10-27 LAB — CBC WITH DIFFERENTIAL/PLATELET
BASOS ABS: 0 10*3/uL (ref 0.0–0.1)
BASOS PCT: 0 % (ref 0–1)
EOS PCT: 1 % (ref 0–5)
Eosinophils Absolute: 0 10*3/uL (ref 0.0–0.7)
HCT: 43 % (ref 39.0–52.0)
Hemoglobin: 14.1 g/dL (ref 13.0–17.0)
Lymphocytes Relative: 14 % (ref 12–46)
Lymphs Abs: 0.7 10*3/uL (ref 0.7–4.0)
MCH: 34.1 pg — ABNORMAL HIGH (ref 26.0–34.0)
MCHC: 32.8 g/dL (ref 30.0–36.0)
MCV: 104.1 fL — ABNORMAL HIGH (ref 78.0–100.0)
MONO ABS: 0.3 10*3/uL (ref 0.1–1.0)
Monocytes Relative: 6 % (ref 3–12)
Neutro Abs: 4.4 10*3/uL (ref 1.7–7.7)
Neutrophils Relative %: 80 % — ABNORMAL HIGH (ref 43–77)
PLATELETS: 71 10*3/uL — AB (ref 150–400)
RBC: 4.13 MIL/uL — AB (ref 4.22–5.81)
RDW: 15.3 % (ref 11.5–15.5)
WBC: 5.5 10*3/uL (ref 4.0–10.5)

## 2014-10-27 LAB — COMPREHENSIVE METABOLIC PANEL
ALT: 26 U/L (ref 17–63)
AST: 19 U/L (ref 15–41)
Albumin: 3.7 g/dL (ref 3.5–5.0)
Alkaline Phosphatase: 78 U/L (ref 38–126)
Anion gap: 9 (ref 5–15)
BUN: 24 mg/dL — ABNORMAL HIGH (ref 6–20)
CALCIUM: 8.4 mg/dL — AB (ref 8.9–10.3)
CO2: 29 mmol/L (ref 22–32)
Chloride: 102 mmol/L (ref 101–111)
Creatinine, Ser: 1.51 mg/dL — ABNORMAL HIGH (ref 0.61–1.24)
GFR calc Af Amer: >60 mL/min (ref >60)
GFR calc non Af Amer: 52 mL/min — ABNORMAL LOW (ref >60)
GLUCOSE: 164 mg/dL — AB (ref 65–99)
Potassium: 4.9 mmol/L (ref 3.5–5.1)
SODIUM: 140 mmol/L (ref 135–145)
TOTAL PROTEIN: 6.7 g/dL (ref 6.5–8.1)
Total Bilirubin: 1.1 mg/dL (ref 0.3–1.2)

## 2014-10-27 LAB — TROPONIN I: Troponin I: <0.03 ng/mL (ref <0.031)

## 2014-10-27 LAB — INFLUENZA PANEL BY PCR (TYPE A & B)
H1N1 flu by pcr: NOT DETECTED
INFLAPCR: NEGATIVE
INFLBPCR: NEGATIVE

## 2014-10-27 LAB — HEPATIC FUNCTION PANEL
ALT: 26 U/L (ref 17–63)
AST: 21 U/L (ref 15–41)
Albumin: 3.7 g/dL (ref 3.5–5.0)
Alkaline Phosphatase: 75 U/L (ref 38–126)
BILIRUBIN DIRECT: 0.3 mg/dL (ref 0.1–0.5)
BILIRUBIN TOTAL: 1.1 mg/dL (ref 0.3–1.2)
Indirect Bilirubin: 0.8 mg/dL (ref 0.3–0.9)
Total Protein: 6.8 g/dL (ref 6.5–8.1)

## 2014-10-27 LAB — GLUCOSE, CAPILLARY: Glucose-Capillary: 188 mg/dL — ABNORMAL HIGH (ref 65–99)

## 2014-10-27 MED ORDER — ACETAMINOPHEN 650 MG RE SUPP: 650.0000 mg | Freq: Four times a day (QID) | RECTAL | Status: DC | PRN

## 2014-10-27 MED ORDER — CITALOPRAM HYDROBROMIDE 20 MG PO TABS
40.0000 mg | ORAL_TABLET | Freq: Every day | ORAL | Status: DC
  Administered 2014-10-27 – 2014-10-31 (×5): 40 mg via ORAL
  Filled 2014-10-27 (×5): qty 2

## 2014-10-27 MED ORDER — GABAPENTIN 300 MG PO CAPS
300.0000 mg | ORAL_CAPSULE | Freq: Three times a day (TID) | ORAL | Status: DC
  Administered 2014-10-27 – 2014-11-01 (×16): 300 mg via ORAL
  Filled 2014-10-27 (×16): qty 1

## 2014-10-27 MED ORDER — HYDROCODONE-ACETAMINOPHEN 5-325 MG PO TABS
1.0000 | ORAL_TABLET | ORAL | Status: DC | PRN
  Administered 2014-10-27: 2 via ORAL
  Administered 2014-10-28: 1 via ORAL
  Administered 2014-10-28: 2 via ORAL
  Administered 2014-10-28: 1 via ORAL
  Administered 2014-10-29 – 2014-11-01 (×4): 2 via ORAL
  Filled 2014-10-27: qty 2
  Filled 2014-10-27: qty 1
  Filled 2014-10-27 (×5): qty 2
  Filled 2014-10-27: qty 1

## 2014-10-27 MED ORDER — INSULIN ASPART 100 UNIT/ML ~~LOC~~ SOLN: 0.0000 [IU] | Freq: Every day | SUBCUTANEOUS | Status: DC

## 2014-10-27 MED ORDER — ALBUTEROL (5 MG/ML) CONTINUOUS INHALATION SOLN
10.0000 mg/h | INHALATION_SOLUTION | RESPIRATORY_TRACT | Status: DC
  Administered 2014-10-27: 10 mg/h via RESPIRATORY_TRACT

## 2014-10-27 MED ORDER — ALBUTEROL (5 MG/ML) CONTINUOUS INHALATION SOLN
INHALATION_SOLUTION | RESPIRATORY_TRACT | Status: AC
  Filled 2014-10-27: qty 20

## 2014-10-27 MED ORDER — SPIRONOLACTONE 25 MG PO TABS
25.0000 mg | ORAL_TABLET | Freq: Two times a day (BID) | ORAL | Status: DC
  Administered 2014-10-27 – 2014-11-01 (×10): 25 mg via ORAL
  Filled 2014-10-27 (×10): qty 1

## 2014-10-27 MED ORDER — TIOTROPIUM BROMIDE MONOHYDRATE 18 MCG IN CAPS
18.0000 ug | ORAL_CAPSULE | Freq: Every day | RESPIRATORY_TRACT | Status: DC
  Filled 2014-10-27: qty 5

## 2014-10-27 MED ORDER — FUROSEMIDE 10 MG/ML IJ SOLN
40.0000 mg | Freq: Two times a day (BID) | INTRAMUSCULAR | Status: DC
  Administered 2014-10-27: 40 mg via INTRAVENOUS
  Filled 2014-10-27: qty 4

## 2014-10-27 MED ORDER — IPRATROPIUM-ALBUTEROL 0.5-2.5 (3) MG/3ML IN SOLN
3.0000 mL | RESPIRATORY_TRACT | Status: DC
  Administered 2014-10-27 – 2014-10-29 (×12): 3 mL via RESPIRATORY_TRACT
  Filled 2014-10-27 (×11): qty 3

## 2014-10-27 MED ORDER — METHYLPREDNISOLONE SODIUM SUCC 125 MG IJ SOLR
125.0000 mg | Freq: Once | INTRAMUSCULAR | Status: AC
  Administered 2014-10-27: 125 mg via INTRAVENOUS
  Filled 2014-10-27: qty 2

## 2014-10-27 MED ORDER — GUAIFENESIN-DM 100-10 MG/5ML PO SYRP
5.0000 mL | ORAL_SOLUTION | ORAL | Status: DC | PRN
  Administered 2014-10-27: 5 mL via ORAL
  Filled 2014-10-27: qty 5

## 2014-10-27 MED ORDER — LEVOFLOXACIN 750 MG PO TABS
750.0000 mg | ORAL_TABLET | Freq: Every day | ORAL | Status: DC
  Administered 2014-10-27 – 2014-11-01 (×6): 750 mg via ORAL
  Filled 2014-10-27 (×6): qty 1

## 2014-10-27 MED ORDER — IPRATROPIUM-ALBUTEROL 0.5-2.5 (3) MG/3ML IN SOLN
3.0000 mL | RESPIRATORY_TRACT | Status: DC
  Administered 2014-10-27: 3 mL via RESPIRATORY_TRACT

## 2014-10-27 MED ORDER — CIPROFLOXACIN HCL 250 MG PO TABS: 750.0000 mg | ORAL_TABLET | ORAL | Status: DC

## 2014-10-27 MED ORDER — BUDESONIDE-FORMOTEROL FUMARATE 160-4.5 MCG/ACT IN AERO
1.0000 | INHALATION_SPRAY | Freq: Two times a day (BID) | RESPIRATORY_TRACT | Status: DC
  Administered 2014-10-28 – 2014-11-01 (×9): 1 via RESPIRATORY_TRACT
  Filled 2014-10-27: qty 6

## 2014-10-27 MED ORDER — OXYCODONE HCL 5 MG PO TABS
5.0000 mg | ORAL_TABLET | Freq: Four times a day (QID) | ORAL | Status: DC | PRN
  Administered 2014-10-31: 5 mg via ORAL
  Filled 2014-10-27: qty 1

## 2014-10-27 MED ORDER — DEXTROMETHORPHAN POLISTIREX ER 30 MG/5ML PO SUER
30.0000 mg | Freq: Two times a day (BID) | ORAL | Status: DC
  Administered 2014-10-27 – 2014-11-01 (×10): 30 mg via ORAL
  Filled 2014-10-27 (×12): qty 5

## 2014-10-27 MED ORDER — ONDANSETRON HCL 4 MG PO TABS: 4.0000 mg | ORAL_TABLET | Freq: Four times a day (QID) | ORAL | Status: DC | PRN

## 2014-10-27 MED ORDER — ONDANSETRON HCL 4 MG/2ML IJ SOLN
4.0000 mg | Freq: Four times a day (QID) | INTRAMUSCULAR | Status: DC | PRN
  Administered 2014-10-28: 4 mg via INTRAVENOUS
  Filled 2014-10-27: qty 2

## 2014-10-27 MED ORDER — IPRATROPIUM-ALBUTEROL 0.5-2.5 (3) MG/3ML IN SOLN: 3.0000 mL | Freq: Once | RESPIRATORY_TRACT | Status: AC

## 2014-10-27 MED ORDER — METHYLPREDNISOLONE SODIUM SUCC 125 MG IJ SOLR
60.0000 mg | Freq: Two times a day (BID) | INTRAMUSCULAR | Status: DC
  Administered 2014-10-27 – 2014-10-30 (×7): 60 mg via INTRAVENOUS
  Filled 2014-10-27 (×7): qty 2

## 2014-10-27 MED ORDER — SODIUM CHLORIDE 0.9 % IV SOLN: 250.0000 mL | INTRAVENOUS | Status: DC | PRN

## 2014-10-27 MED ORDER — ALBUTEROL SULFATE (2.5 MG/3ML) 0.083% IN NEBU
INHALATION_SOLUTION | RESPIRATORY_TRACT | Status: AC
  Filled 2014-10-27: qty 3

## 2014-10-27 MED ORDER — SODIUM CHLORIDE 0.9 % IJ SOLN
3.0000 mL | Freq: Two times a day (BID) | INTRAMUSCULAR | Status: DC
  Administered 2014-10-27 – 2014-11-01 (×9): 3 mL via INTRAVENOUS

## 2014-10-27 MED ORDER — VITAMIN B-12 1000 MCG PO TABS
500.0000 ug | ORAL_TABLET | Freq: Every day | ORAL | Status: DC
  Administered 2014-10-28 – 2014-11-01 (×5): 500 ug via ORAL
  Filled 2014-10-27 (×5): qty 1

## 2014-10-27 MED ORDER — ALUM & MAG HYDROXIDE-SIMETH 200-200-20 MG/5ML PO SUSP: 30.0000 mL | Freq: Four times a day (QID) | ORAL | Status: DC | PRN

## 2014-10-27 MED ORDER — ACETAMINOPHEN 325 MG PO TABS
650.0000 mg | ORAL_TABLET | Freq: Four times a day (QID) | ORAL | Status: DC | PRN
  Administered 2014-10-31: 650 mg via ORAL
  Filled 2014-10-27: qty 2

## 2014-10-27 MED ORDER — IPRATROPIUM-ALBUTEROL 0.5-2.5 (3) MG/3ML IN SOLN
RESPIRATORY_TRACT | Status: AC
Start: 2014-10-27 — End: 2014-10-27
  Filled 2014-10-27: qty 3

## 2014-10-27 MED ORDER — ALBUTEROL SULFATE (2.5 MG/3ML) 0.083% IN NEBU: 3.0000 mL | INHALATION_SOLUTION | Freq: Four times a day (QID) | RESPIRATORY_TRACT | Status: DC | PRN

## 2014-10-27 MED ORDER — SODIUM CHLORIDE 0.9 % IJ SOLN
3.0000 mL | INTRAMUSCULAR | Status: DC | PRN
Start: 2014-10-27 — End: 2014-11-01
  Administered 2014-10-31: 3 mL via INTRAVENOUS
  Filled 2014-10-27: qty 3

## 2014-10-27 MED ORDER — ALBUTEROL SULFATE (2.5 MG/3ML) 0.083% IN NEBU
2.5000 mg | INHALATION_SOLUTION | Freq: Once | RESPIRATORY_TRACT | Status: AC
  Administered 2014-10-27: 2.5 mg via RESPIRATORY_TRACT

## 2014-10-27 MED ORDER — TRAZODONE HCL 50 MG PO TABS
300.0000 mg | ORAL_TABLET | Freq: Every day | ORAL | Status: DC
  Administered 2014-10-27 – 2014-10-31 (×5): 300 mg via ORAL
  Filled 2014-10-27 (×5): qty 6

## 2014-10-27 MED ORDER — MAGNESIUM CITRATE PO SOLN: 1.0000 | Freq: Once | ORAL | Status: AC | PRN

## 2014-10-27 MED ORDER — BISACODYL 5 MG PO TBEC: 5.0000 mg | DELAYED_RELEASE_TABLET | Freq: Every day | ORAL | Status: DC | PRN

## 2014-10-27 MED ORDER — INSULIN ASPART 100 UNIT/ML ~~LOC~~ SOLN
0.0000 [IU] | Freq: Three times a day (TID) | SUBCUTANEOUS | Status: DC
  Administered 2014-10-28: 3 [IU] via SUBCUTANEOUS
  Administered 2014-10-28: 2 [IU] via SUBCUTANEOUS
  Administered 2014-10-28: 3 [IU] via SUBCUTANEOUS
  Administered 2014-10-29 (×2): 2 [IU] via SUBCUTANEOUS
  Administered 2014-10-30 (×2): 3 [IU] via SUBCUTANEOUS
  Administered 2014-10-30: 2 [IU] via SUBCUTANEOUS
  Administered 2014-10-31: 3 [IU] via SUBCUTANEOUS
  Administered 2014-10-31: 2 [IU] via SUBCUTANEOUS
  Administered 2014-10-31: 3 [IU] via SUBCUTANEOUS
  Administered 2014-11-01 (×2): 2 [IU] via SUBCUTANEOUS

## 2014-10-27 MED ORDER — RIFAXIMIN 550 MG PO TABS
550.0000 mg | ORAL_TABLET | Freq: Two times a day (BID) | ORAL | Status: DC
  Administered 2014-10-27 – 2014-11-01 (×10): 550 mg via ORAL
  Filled 2014-10-27 (×10): qty 1

## 2014-10-27 MED ORDER — ARIPIPRAZOLE 10 MG PO TABS
30.0000 mg | ORAL_TABLET | Freq: Every day | ORAL | Status: DC
  Administered 2014-10-27 – 2014-10-31 (×5): 30 mg via ORAL
  Filled 2014-10-27 (×5): qty 3

## 2014-10-27 NOTE — ED Notes (Signed)
Received albuterol and duoneb en route. o2 @ 4L/min via [place]

## 2014-10-27 NOTE — H&P (Signed)
[facility] History and Physical  Jorge [name] ZOX:[id number] DOB: [birth year] DOA: 10/27/2014  Referring physician: Betsey Holiday PCP: Jorge Fire, MD   Chief Complaint: worsening DOE  HPI: Jorge [name] is a 49 y.o. male with a past medical history that includes COPD, hypertension, obesity, sleep apnea, cirrhosis, tobacco use presents to the emergency department with the chief complaint of worsening dyspnea on exertion. Initial evaluation revealed acute respiratory failure with hypoxia and acute on chronic renal failure.  Patient reports about 2 days ago he developed head cold symptoms that quickly moved to his chest. Associated symptoms include productive cough with thick white sputum, nausea without emesis, decreased oral intake, worsening lower extremity edema. He denies chest pain palpitations headache dizziness syncope or near-syncope. He denies abdominal pain dysuria hematuria frequency or urgency. He denies constipation diarrhea melanotic or bright red blood per rectum. He reports subjective fever and general malaise. He denies recent travel or sick contacts.  Workup in the emergency department includes complete blood count significant for RBCs 7.13 and platelets of 71, basic metabolic panel significant for BUN of 24 creatinine of 1.51 serum glucose 164. ProBNP 136. Chest x-ray with no active disease. Cardiomegaly noted. EKG with sinus rhythm. In the emergency department he is afebrile hemodynamically stable with hypoxia at 88% on room air. He is provided with nebulizer and Solu-Medrol.  Review of Systems:  10 point review of systems complete and all systems are negative except as indicated in the history of present illness   Past Medical History  Diagnosis Date  . COPD (chronic obstructive pulmonary disease)   . Cirrhosis sleep apnea  . Hypertension   . Renal disorder     stage II  . Sleep apnea   . Tobacco abuse    Past Surgical History  Procedure Laterality Date  .  Cholecystectomy     Social History:  reports that he has been smoking Cigarettes.  He has been smoking about 0.50 packs per day. He does not have any smokeless tobacco history on file. He reports that he does not drink alcohol or use illicit drugs. He lives in a group home he is fairly independent with ADLs he ambulates with a cane Allergies  Allergen Reactions  . Penicillins Anaphylaxis    Patient states he is not allergic to anything;  Pt says it's his twin brother that has this allergy.    Family History  Problem Relation Age of Onset  . Heart disease Father   . Cancer Mother     breast     Prior to Admission medications   Medication Sig Start Date End Date Taking? Authorizing Provider  ARIPiprazole (ABILIFY) 30 MG tablet Take 30 mg by mouth at bedtime.  12/03/13  Yes Historical Provider, MD  ciprofloxacin (CIPRO) 750 MG tablet Take 1 tablet by mouth once a week. 10/22/14  Yes Historical Provider, MD  citalopram (CELEXA) 40 MG tablet Take 40 mg by mouth at bedtime.  12/03/13  Yes Historical Provider, MD  dextromethorphan (DELSYM) 30 MG/5ML liquid Take 30 mg by mouth every 12 (twelve) hours.   Yes Historical Provider, MD  furosemide (LASIX) 40 MG tablet Take 40 mg by mouth daily. 12/03/13  Yes Historical Provider, MD  gabapentin (NEURONTIN) 300 MG capsule Take 300 mg by mouth 3 (three) times daily. 12/03/13  Yes Historical Provider, MD  metoprolol succinate (TOPROL-XL) 50 MG 24 hr tablet Take 50 mg by mouth daily. Take with or immediately following a meal.   Yes Historical Provider, MD  oxyCODONE (OXY IR/ROXICODONE) 5 MG immediate release tablet Take 5 mg by mouth every 6 (six) hours as needed for severe pain.    Yes Historical Provider, MD  predniSONE (STERAPRED UNI-PAK 21 TAB) 10 MG (21) TBPK tablet Take 1 tablet (10 mg total) by mouth daily. 4 tab po daily for 3 days, then 3 tab po daily for 3 days, then 2 tab po daily for 3 days, then 1 tab po daily for 3 days then stop 10/07/14  Yes  Jorge Fire, MD  PROAIR HFA 108 (90 BASE) MCG/ACT inhaler Inhale 2 puffs into the lungs every 6 (six) hours as needed for wheezing or shortness of breath. Shortness of breath/wheeze 12/05/13  Yes Historical Provider, MD  spironolactone (ALDACTONE) 25 MG tablet Take 25 mg by mouth 2 (two) times daily. 12/03/13  Yes Historical Provider, MD  SYMBICORT 160-4.5 MCG/ACT inhaler Inhale 1 puff into the lungs 2 (two) times daily. 12/08/13  Yes Historical Provider, MD  Tiotropium Bromide Monohydrate 2.5 MCG/ACT AERS Inhale 2 puffs into the lungs daily.   Yes Historical Provider, MD  traZODone (DESYREL) 150 MG tablet Take 300 mg by mouth at bedtime.  12/03/13  Yes Historical Provider, MD  vitamin B-12 (CYANOCOBALAMIN) 500 MCG tablet Take 500 mcg by mouth daily.   Yes Historical Provider, MD  XIFAXAN 550 MG TABS tablet Take 550 mg by mouth 2 (two) times daily. 12/03/13  Yes Historical Provider, MD   Physical Exam: Filed Vitals:   10/27/14 1115 10/27/14 1130 10/27/14 1200 10/27/14 1230  BP:  122/64 100/55 107/51  Pulse:  85 83   Temp:      TempSrc:      Resp:  23 22 23   Height:      Weight:      SpO2: 96% 96% 94%     Wt Readings from Last 3 Encounters:  10/27/14 149.687 kg (330 lb)  10/03/14 157.5 kg (347 lb 3.6 oz)  09/01/14 145.65 kg (321 lb 1.6 oz)    General:  Appears calm and comfortable slightly anxious, face is somewhat puffy Eyes: PERRL, normal lids, irises & conjunctiva ENT: grossly normal hearing, lips & tongue, because membranes of his mouth are slightly dry but pink Neck: no LAD, masses or thyromegaly Cardiovascular: RRR, no m/r/g. Heart sounds distant. 1+ pitting edema bilaterally. Bilateral chronic venous stasis changes to the skin Telemetry: SR, no arrhythmias  Respiratory:  Normal respiratory effort with conversation. Breath sounds with fair air movement. Faint crackles in bases bilaterally somewhat prolonged expiratory phase faint wheezes Abdomen: soft, ntnd obese positive bowel  sounds Skin: Face looks flushed he reports sunburn, skin with chronic venous stasis changes. Musculoskeletal: grossly normal tone BUE/BLE Psychiatric: grossly normal mood and affect, speech fluent and appropriate Neurologic: grossly non-focal. Cranial nerves II through XII grossly intact           Labs on Admission:  Basic Metabolic Panel:  Recent Labs Lab 10/27/14 1131  NA 140  K 4.9  CL 102  CO2 29  GLUCOSE 164*  BUN 24*  CREATININE 1.51*  CALCIUM 8.4*   Liver Function Tests:  Recent Labs Lab 10/27/14 1131  AST 19  ALT 26  ALKPHOS 78  BILITOT 1.1  PROT 6.7  ALBUMIN 3.7   No results for input(s): LIPASE, AMYLASE in the last 168 hours. No results for input(s): AMMONIA in the last 168 hours. CBC:  Recent Labs Lab 10/27/14 1131  WBC 5.5  NEUTROABS 4.4  HGB 14.1  HCT 43.0  MCV  104.1*  PLT 71*   Cardiac Enzymes:  Recent Labs Lab 10/27/14 1131  TROPONINI <0.03    BNP (last 3 results)  Recent Labs  10/27/14 1131  BNP 136.0*    ProBNP (last 3 results)  Recent Labs  12/13/13 1912 03/18/14 1249  PROBNP 334.9* 209.9*    CBG: No results for input(s): GLUCAP in the last 168 hours.  Radiological Exams on Admission: Dg Chest 1 View  10/27/2014   CLINICAL DATA:  Shortness of breath, bilateral leg swelling, history of cirrhosis  EXAM: CHEST  1 VIEW  COMPARISON:  10/03/2014  FINDINGS: Cardiomegaly again noted. The study is limited by patient's large body habitus. No convincing infiltrate or pulmonary edema.  IMPRESSION: No active disease. Cardiomegaly again noted. Suboptimal study due to patient's large body habitus.   Electronically Signed   By: Lahoma Crocker M.D.   On: 10/27/2014 11:37    EKG:   Assessment/Plan Principal Problem:   Acute respiratory failure with hypoxia: Reports cold symptoms trigger. Will admit to telemetry. Will continue Solu-Medrol and a biotics scheduled nebulizers. Will continue oxygen support as indicated. He reports not  requiring oxygen at home. Of note this is his third admission in 3 months for the same. Will add flutter valve Active Problems:   COPD exacerbation: Trigger unclear but patient does report spending a lot of time outside this spring. See #1. Last hospitalization he was evaluated by pulmonology. Will need close outpatient follow-up to avoid frequent hospitalizations. Will monitor closely for oxygen needs a discharge  CKD (chronic kidney disease); current creatinine appears slightly above baseline. Hold nephrotoxic and. Monitor intake and output. Recheck in the a.m.  Worsening lower extremity edema: Last echo April of this year with an EF of 60-65%, mild LVH. He is on daily Lasix at home and he reports he's been taking that. He does have pitting edema lower extremities bilaterally. Will provide IV Lasix as long as blood pressure supports.. Monitor intake and output and obtain daily weight.    Essential hypertension: Home medications include Lasix, Toprol. Blood pressure on low end of normal at admission. Will hold his beta blocker for now secondary to #2.    Cirrhosis: Continues home meds.      GERD without esophagitis: Stable at baseline. Will continue home meds    Sleep apnea: Noncompliant    Tobacco abuse: Cessation counseling offered    Thrombocytopenia: Likely related to cirrhosis. Chart review indicates chronic and current level close to baseline. No signs symptoms of bleeding. Use SCDs for DVT prophylaxis    Hyperglycemia: Likely related to persistent use of stereotactic over the last 3 months. Will use sliding scale insulin for optimal control    Code Status: full DVT Prophylaxis: Family Communication: none present Disposition Plan: back to group home hopefully 24-48 hours  Time spent: 65 minutes  [facility] Pager [phone]

## 2014-10-27 NOTE — ED Notes (Signed)
Pt reports that he didn't feel well Friday and Sunday night increased swelling in lower extremities and shortness of breath.

## 2014-10-27 NOTE — ED Provider Notes (Signed)
CSN: [id number]     Arrival date & time 10/27/14  1050 History  This chart was scribed for Jorge Greek, MD by Rayna Sexton, ED scribe. This patient was seen in room APA08/APA08 and the patient's care was started at 11:02 AM.    Chief Complaint  Patient presents with  . Shortness of Breath    The history is provided by the patient. No language interpreter was used.    HPI Comments: Jorge [name] is a 49 y.o. male, with a history of COPD, who presents to the Emergency Department complaining of intermittent, worsening, SOB with onset in the last few days. Pt notes coughing and swelling of the legs bilaterally as associated symptoms. He additionally notes taking 40 mg of Lasix daily. Pt denies CP and fever..   Past Medical History  Diagnosis Date  . COPD (chronic obstructive pulmonary disease)   . Cirrhosis sleep apnea  . Hypertension   . Renal disorder    Past Surgical History  Procedure Laterality Date  . Cholecystectomy     Family History  Problem Relation Age of Onset  . Heart disease Father   . Cancer Mother     breast   History  Substance Use Topics  . Smoking status: Current Every Day Smoker -- 0.50 packs/day    Types: Cigarettes  . Smokeless tobacco: Not on file  . Alcohol Use: No    Review of Systems  Constitutional: Negative for fever.  Respiratory: Positive for cough and shortness of breath.   Cardiovascular: Negative for chest pain.  Musculoskeletal: Positive for myalgias.  Skin: Positive for color change and pallor.  All other systems reviewed and are negative.     Allergies  Penicillins  Home Medications   Prior to Admission medications   Medication Sig Start Date End Date Taking? Authorizing Provider  ARIPiprazole (ABILIFY) 30 MG tablet Take 30 mg by mouth at bedtime.  12/03/13   Historical Provider, MD  citalopram (CELEXA) 40 MG tablet Take 40 mg by mouth at bedtime.  12/03/13   Historical Provider, MD  dextromethorphan (DELSYM) 30  MG/5ML liquid Take 30 mg by mouth every 12 (twelve) hours.    Historical Provider, MD  furosemide (LASIX) 40 MG tablet Take 40 mg by mouth daily. 12/03/13   Historical Provider, MD  gabapentin (NEURONTIN) 300 MG capsule Take 300 mg by mouth 3 (three) times daily. 12/03/13   Historical Provider, MD  metoprolol succinate (TOPROL-XL) 50 MG 24 hr tablet Take 50 mg by mouth daily. Take with or immediately following a meal.    Historical Provider, MD  omeprazole (PRILOSEC) 20 MG capsule Take 20 mg by mouth daily.    Historical Provider, MD  oxyCODONE (OXY IR/ROXICODONE) 5 MG immediate release tablet Take 5 mg by mouth every 6 (six) hours as needed for severe pain.     Historical Provider, MD  predniSONE (STERAPRED UNI-PAK 21 TAB) 10 MG (21) TBPK tablet Take 1 tablet (10 mg total) by mouth daily. 4 tab po daily for 3 days, then 3 tab po daily for 3 days, then 2 tab po daily for 3 days, then 1 tab po daily for 3 days then stop 10/07/14   Rosita Fire, MD  PROAIR HFA 108 (90 BASE) MCG/ACT inhaler Inhale 2 puffs into the lungs every 4 (four) hours as needed. Shortness of breath/wheeze 12/05/13   Historical Provider, MD  spironolactone (ALDACTONE) 25 MG tablet Take 25 mg by mouth 2 (two) times daily. 12/03/13   Historical Provider,  MD  SYMBICORT 160-4.5 MCG/ACT inhaler Inhale 1 puff into the lungs 2 (two) times daily. 12/08/13   Historical Provider, MD  Tiotropium Bromide Monohydrate 2.5 MCG/ACT AERS Inhale 2 puffs into the lungs daily.    Historical Provider, MD  traZODone (DESYREL) 150 MG tablet Take 300 mg by mouth at bedtime.  12/03/13   Historical Provider, MD  vitamin B-12 (CYANOCOBALAMIN) 500 MCG tablet Take 500 mcg by mouth daily.    Historical Provider, MD  XIFAXAN 550 MG TABS tablet Take 550 mg by mouth 2 (two) times daily. 12/03/13   Historical Provider, MD   There were no vitals taken for this visit. Physical Exam  Constitutional: He is oriented to person, place, and time. He appears well-developed and  well-nourished. No distress.  HENT:  Head: Normocephalic and atraumatic.  Right Ear: Hearing normal.  Left Ear: Hearing normal.  Nose: Nose normal.  Mouth/Throat: Oropharynx is clear and moist and mucous membranes are normal.  Eyes: Conjunctivae and EOM are normal. Pupils are equal, round, and reactive to light.  Neck: Normal range of motion. Neck supple.  Cardiovascular: Regular rhythm, S1 normal and S2 normal.  Exam reveals no gallop and no friction rub.   No murmur heard. Pulmonary/Chest: Tachypnea noted. He is in respiratory distress. He has wheezes. He has rales. He exhibits tenderness.  Abdominal: Soft. Normal appearance and bowel sounds are normal. There is no hepatosplenomegaly. There is no tenderness. There is no rebound, no guarding, no tenderness at McBurney's point and negative Murphy's sign. No hernia.  Musculoskeletal: Normal range of motion. He exhibits edema.  3+ Edema in legs bilaterally  Neurological: He is alert and oriented to person, place, and time. He has normal strength. No cranial nerve deficit or sensory deficit. Coordination normal. GCS eye subscore is 4. GCS verbal subscore is 5. GCS motor subscore is 6.  Skin: Skin is warm, dry and intact. No rash noted. No cyanosis.  Psychiatric: He has a normal mood and affect. His speech is normal and behavior is normal. Thought content normal.  Nursing note and vitals reviewed.   ED Course  Procedures  DIAGNOSTIC STUDIES: Oxygen Saturation is 98% on RA, normal by my interpretation.    COORDINATION OF CARE: 11:04 AM Discussed treatment plan with pt at bedside and pt agreed to plan.  Labs Review Labs Reviewed  CBC WITH DIFFERENTIAL/PLATELET - Abnormal; Notable for the following:    RBC 4.13 (*)    MCV 104.1 (*)    MCH 34.1 (*)    Platelets 71 (*)    Neutrophils Relative % 80 (*)    All other components within normal limits  COMPREHENSIVE METABOLIC PANEL - Abnormal; Notable for the following:    Glucose, Bld 164  (*)    BUN 24 (*)    Creatinine, Ser 1.51 (*)    Calcium 8.4 (*)    GFR calc non Af Amer 52 (*)    All other components within normal limits  BRAIN NATRIURETIC PEPTIDE - Abnormal; Notable for the following:    B Natriuretic Peptide 136.0 (*)    All other components within normal limits  TROPONIN I    Imaging Review Dg Chest 1 View  10/27/2014   CLINICAL DATA:  Shortness of breath, bilateral leg swelling, history of cirrhosis  EXAM: CHEST  1 VIEW  COMPARISON:  10/03/2014  FINDINGS: Cardiomegaly again noted. The study is limited by patient's large body habitus. No convincing infiltrate or pulmonary edema.  IMPRESSION: No active disease. Cardiomegaly again  noted. Suboptimal study due to patient's large body habitus.   Electronically Signed   By: Lahoma Crocker M.D.   On: 10/27/2014 11:37     EKG Interpretation   Date/Time:  Tuesday October 27 2014 10:52:13 EDT Ventricular Rate:  71 PR Interval:  159 QRS Duration: 97 QT Interval:  385 QTC Calculation: 418 R Axis:   44 Text Interpretation:  Sinus rhythm Low voltage, precordial leads Probable  anteroseptal infarct, old Confirmed by Macai Sisneros  MD, Kresta Templeman [phone]) on  10/27/2014 11:15:38 AM      MDM   Final diagnoses:  SOB (shortness of breath)   COPD exacerbation  Patient presents to the ER for evaluation of shortness of breath. Patient has a history of liver cirrhosis and COPD. He reports increased swelling of his legs, but examination revealed significant bronchospasm upon arrival. He already received nebulizer treatments by EMS. Patient was given a continuous nebulizer treatment and did have increased aeration but continued to have significant wheezing. He was administered IV Solu-Medrol. Chest x-ray does not show any evidence of pneumonia. Patient will require hospitalization for further management of COPD exacerbation.  I personally performed the services described in this documentation, which was scribed in my presence. The recorded  information has been reviewed and is accurate.    Jorge Greek, MD 10/27/14 [phone]

## 2014-10-27 NOTE — Progress Notes (Signed)
ANTIBIOTIC CONSULT NOTE  Pharmacy Consult for Levaquin  Indication: COPD exacerbation  Allergies  Allergen Reactions  . Penicillins Anaphylaxis    Patient states he is not allergic to anything;  Pt says it's his twin brother that has this allergy.    Patient Measurements: Height: 6\' 3"  (190.5 cm) Weight: (!) 330 lb (149.687 kg) IBW/kg (Calculated) : 84.5  Vital Signs: Temp: 98.5 F (36.9 C) (06/14 1054) Temp Source: Oral (06/14 1054) BP: 112/53 mmHg (06/14 1645) Pulse Rate: 74 (06/14 1645) Intake/Output from previous day:   Intake/Output from this shift:    Labs:  Recent Labs  10/27/14 1131  WBC 5.5  HGB 14.1  PLT 71*  CREATININE 1.51*   Estimated Creatinine Clearance: 91.6 mL/min (by C-G formula based on Cr of 1.51). No results for input(s): VANCOTROUGH, VANCOPEAK, VANCORANDOM, GENTTROUGH, GENTPEAK, GENTRANDOM, TOBRATROUGH, TOBRAPEAK, TOBRARND, AMIKACINPEAK, AMIKACINTROU, AMIKACIN in the last 72 hours.   Microbiology: Recent Results (from the past 720 hour(s))  MRSA PCR Screening     Status: None   Collection Time: 10/04/14  2:00 AM  Result Value Ref Range Status   MRSA by PCR NEGATIVE NEGATIVE Final    Comment:        The GeneXpert MRSA Assay (FDA approved for NASAL specimens only), is one component of a comprehensive MRSA colonization surveillance program. It is not intended to diagnose MRSA infection nor to guide or monitor treatment for MRSA infections.     Anti-infectives    Start     Dose/Rate Route Frequency Ordered Stop   10/27/14 1730  levofloxacin (LEVAQUIN) tablet 750 mg     750 mg Oral Daily 10/27/14 1720        Assessment: 49 yo M admitted with COPD exacerbation. Afebrile with normal WBC.  Renal function at patient's baseline.   Goal of Therapy:  Eradicate infection.  Plan:  Levaquin 750mg  po q24h Duration of therapy per MD- recommend 5 days No dose adjustments anticipated- pharmacy to sign off.  Please re-consult as  needed  Biagio Borg 10/27/2014,5:21 PM

## 2014-10-28 LAB — GLUCOSE, CAPILLARY
GLUCOSE-CAPILLARY: 180 mg/dL — AB (ref 65–99)
GLUCOSE-CAPILLARY: 112 mg/dL — AB (ref 65–99)
Glucose-Capillary: 175 mg/dL — ABNORMAL HIGH (ref 65–99)
Glucose-Capillary: 140 mg/dL — ABNORMAL HIGH (ref 65–99)

## 2014-10-28 LAB — BASIC METABOLIC PANEL
ANION GAP: 8 (ref 5–15)
BUN: 24 mg/dL — ABNORMAL HIGH (ref 6–20)
CHLORIDE: 99 mmol/L — AB (ref 101–111)
CO2: 32 mmol/L (ref 22–32)
Calcium: 8.5 mg/dL — ABNORMAL LOW (ref 8.9–10.3)
Creatinine, Ser: 1.29 mg/dL — ABNORMAL HIGH (ref 0.61–1.24)
GFR calc non Af Amer: >60 mL/min (ref >60)
Glucose, Bld: 162 mg/dL — ABNORMAL HIGH (ref 65–99)
POTASSIUM: 4.6 mmol/L (ref 3.5–5.1)
SODIUM: 139 mmol/L (ref 135–145)

## 2014-10-28 LAB — CBC
HCT: 40.5 % (ref 39.0–52.0)
HEMOGLOBIN: 13.4 g/dL (ref 13.0–17.0)
MCH: 34.2 pg — AB (ref 26.0–34.0)
MCHC: 33.1 g/dL (ref 30.0–36.0)
MCV: 103.3 fL — AB (ref 78.0–100.0)
Platelets: 78 10*3/uL — ABNORMAL LOW (ref 150–400)
RBC: 3.92 MIL/uL — AB (ref 4.22–5.81)
RDW: 14.8 % (ref 11.5–15.5)
WBC: 4.4 10*3/uL (ref 4.0–10.5)

## 2014-10-28 MED ORDER — FUROSEMIDE 40 MG PO TABS
40.0000 mg | ORAL_TABLET | Freq: Every day | ORAL | Status: DC
  Administered 2014-10-28 – 2014-11-01 (×5): 40 mg via ORAL
  Filled 2014-10-28 (×5): qty 1

## 2014-10-28 NOTE — Progress Notes (Signed)
UR chart review completed.  

## 2014-10-28 NOTE — Care Management Note (Signed)
Case Management Note  Patient Details  Name: Jorge [name] MRN: [id number] Date of Birth: 12-17-1965  Subjective/Objective:                  Pt admitted from [name] with respiratory failure. Anticipate discharge back to facility at discharge. Pt is not currently on any home O2.  Action/Plan: CSW will arrange discharge to facility when medically stable. Will need O2 assessment prior to discharge.   Expected Discharge Date:                  Expected Discharge Plan:  Assisted Living / Rest Home  In-House Referral:  Clinical Social Work  Discharge planning Services  CM Consult  Post Acute Care Choice:    Choice offered to:     DME Arranged:    DME Agency:     HH Arranged:    HH Agency:     Status of Service:  In process, will continue to follow  Medicare Important Message Given:    Date Medicare IM Given:    Medicare IM give by:    Date Additional Medicare IM Given:    Additional Medicare Important Message give by:     If discussed at [place] of Stay Meetings, dates discussed:    Additional Comments:  Joylene Draft, RN 10/28/2014, 7:48 AM

## 2014-10-28 NOTE — Progress Notes (Signed)
Subjective: Patient was admitted yesterday due to shortness of breath secondary to acute exacerbation.  Objective: Vital signs in last 24 hours: Temp:  [97.5 F (36.4 C)-98.5 F (36.9 C)] 97.6 F (36.4 C) (06/15 0657) Pulse Rate:  [64-85] 78 (06/15 0657) Resp:  [19-27] 20 (06/15 0657) BP: (67-147)/(39-99) 147/99 mmHg (06/15 0657) SpO2:  [91 %-100 %] 93 % (06/15 0722) Weight:  [149.687 kg (330 lb)-154.586 kg (340 lb 12.8 oz)] 152.908 kg (337 lb 1.6 oz) (06/15 0657) Weight change:  Last BM Date: 10/27/14  Intake/Output from previous day: 06/14 0701 - 06/15 0700 In: 240 [P.O.:240] Out: 800 [Urine:800]  PHYSICAL EXAM General appearance: alert and no distress Resp: clear to auscultation bilaterally Cardio: S1, S2 normal GI: soft, non-tender; bowel sounds normal; no masses,  no organomegaly Extremities: extremities normal, atraumatic, no cyanosis or edema  Lab Results:  Results for orders placed or performed during the hospital encounter of 10/27/14 (from the past 48 hour(s))  CBC with Differential     Status: Abnormal   Collection Time: 10/27/14 11:31 AM  Result Value Ref Range   WBC 5.5 4.0 - 10.5 K/uL   RBC 4.13 (L) 4.22 - 5.81 MIL/uL   Hemoglobin 14.1 13.0 - 17.0 g/dL   HCT 01.7 20.9 - 10.6 %   MCV 104.1 (H) 78.0 - 100.0 fL   MCH 34.1 (H) 26.0 - 34.0 pg   MCHC 32.8 30.0 - 36.0 g/dL   RDW 81.6 61.9 - 69.4 %   Platelets 71 (L) 150 - 400 K/uL    Comment: SPECIMEN CHECKED FOR CLOTS PLATELET COUNT CONFIRMED BY SMEAR    Neutrophils Relative % 80 (H) 43 - 77 %   Neutro Abs 4.4 1.7 - 7.7 K/uL   Lymphocytes Relative 14 12 - 46 %   Lymphs Abs 0.7 0.7 - 4.0 K/uL   Monocytes Relative 6 3 - 12 %   Monocytes Absolute 0.3 0.1 - 1.0 K/uL   Eosinophils Relative 1 0 - 5 %   Eosinophils Absolute 0.0 0.0 - 0.7 K/uL   Basophils Relative 0 0 - 1 %   Basophils Absolute 0.0 0.0 - 0.1 K/uL  Comprehensive metabolic panel     Status: Abnormal   Collection Time: 10/27/14 11:31 AM  Result  Value Ref Range   Sodium 140 135 - 145 mmol/L   Potassium 4.9 3.5 - 5.1 mmol/L   Chloride 102 101 - 111 mmol/L   CO2 29 22 - 32 mmol/L   Glucose, Bld 164 (H) 65 - 99 mg/dL   BUN 24 (H) 6 - 20 mg/dL   Creatinine, Ser 0.98 (H) 0.61 - 1.24 mg/dL   Calcium 8.4 (L) 8.9 - 10.3 mg/dL   Total Protein 6.7 6.5 - 8.1 g/dL   Albumin 3.7 3.5 - 5.0 g/dL   AST 19 15 - 41 U/L   ALT 26 17 - 63 U/L   Alkaline Phosphatase 78 38 - 126 U/L   Total Bilirubin 1.1 0.3 - 1.2 mg/dL   GFR calc non Af Amer 52 (L) >60 mL/min   GFR calc Af Amer >60 >60 mL/min    Comment: (NOTE) The eGFR has been calculated using the CKD EPI equation. This calculation has not been validated in all clinical situations. eGFR's persistently <60 mL/min signify possible Chronic Kidney Disease.    Anion gap 9 5 - 15  Brain natriuretic peptide     Status: Abnormal   Collection Time: 10/27/14 11:31 AM  Result Value Ref Range  B Natriuretic Peptide 136.0 (H) 0.0 - 100.0 pg/mL  Troponin I     Status: None   Collection Time: 10/27/14 11:31 AM  Result Value Ref Range   Troponin I <0.03 <0.031 ng/mL    Comment:        NO INDICATION OF MYOCARDIAL INJURY.   Hepatic function panel     Status: None   Collection Time: 10/27/14 11:31 AM  Result Value Ref Range   Total Protein 6.8 6.5 - 8.1 g/dL   Albumin 3.7 3.5 - 5.0 g/dL   AST 21 15 - 41 U/L   ALT 26 17 - 63 U/L   Alkaline Phosphatase 75 38 - 126 U/L   Total Bilirubin 1.1 0.3 - 1.2 mg/dL   Bilirubin, Direct 0.3 0.1 - 0.5 mg/dL   Indirect Bilirubin 0.8 0.3 - 0.9 mg/dL  Influenza panel by PCR (type A & B, H1N1) (Not at [facility])     Status: None   Collection Time: 10/27/14  6:30 PM  Result Value Ref Range   Influenza A By PCR NEGATIVE NEGATIVE   Influenza B By PCR NEGATIVE NEGATIVE   H1N1 flu by pcr NOT DETECTED NOT DETECTED    Comment:        The Xpert Flu assay (FDA approved for nasal aspirates or washes and nasopharyngeal swab specimens), is intended as an aid in the  diagnosis of influenza and should not be used as a sole basis for treatment.   Glucose, capillary     Status: Abnormal   Collection Time: 10/27/14 10:37 PM  Result Value Ref Range   Glucose-Capillary 188 (H) 65 - 99 mg/dL   Comment 1 Notify RN    Comment 2 Document in Chart   Basic metabolic panel     Status: Abnormal   Collection Time: 10/28/14  6:31 AM  Result Value Ref Range   Sodium 139 135 - 145 mmol/L   Potassium 4.6 3.5 - 5.1 mmol/L   Chloride 99 (L) 101 - 111 mmol/L   CO2 32 22 - 32 mmol/L   Glucose, Bld 162 (H) 65 - 99 mg/dL   BUN 24 (H) 6 - 20 mg/dL   Creatinine, Ser 1.29 (H) 0.61 - 1.24 mg/dL   Calcium 8.5 (L) 8.9 - 10.3 mg/dL   GFR calc non Af Amer >60 >60 mL/min   GFR calc Af Amer >60 >60 mL/min    Comment: (NOTE) The eGFR has been calculated using the CKD EPI equation. This calculation has not been validated in all clinical situations. eGFR's persistently <60 mL/min signify possible Chronic Kidney Disease.    Anion gap 8 5 - 15  CBC     Status: Abnormal   Collection Time: 10/28/14  6:31 AM  Result Value Ref Range   WBC 4.4 4.0 - 10.5 K/uL   RBC 3.92 (L) 4.22 - 5.81 MIL/uL   Hemoglobin 13.4 13.0 - 17.0 g/dL   HCT 40.5 39.0 - 52.0 %   MCV 103.3 (H) 78.0 - 100.0 fL   MCH 34.2 (H) 26.0 - 34.0 pg   MCHC 33.1 30.0 - 36.0 g/dL   RDW 14.8 11.5 - 15.5 %   Platelets 78 (L) 150 - 400 K/uL    Comment: SPECIMEN CHECKED FOR CLOTS CONSISTENT WITH PREVIOUS RESULT     ABGS No results for input(s): PHART, PO2ART, TCO2, HCO3 in the last 72 hours.  Invalid input(s): PCO2 CULTURES No results found for this or any previous visit (from the past 240 hour(s)). Studies/Results:  Dg Chest 1 View  10/27/2014   CLINICAL DATA:  Shortness of breath, bilateral leg swelling, history of cirrhosis  EXAM: CHEST  1 VIEW  COMPARISON:  10/03/2014  FINDINGS: Cardiomegaly again noted. The study is limited by patient's large body habitus. No convincing infiltrate or pulmonary edema.   IMPRESSION: No active disease. Cardiomegaly again noted. Suboptimal study due to patient's large body habitus.   Electronically Signed   By: Lahoma Crocker M.D.   On: 10/27/2014 11:37    Medications: I have reviewed the patient's current medications.  Assesment:   Principal Problem:   Acute respiratory failure with hypoxia Active Problems:   COPD exacerbation   Cirrhosis   CKD (chronic kidney disease)   Essential hypertension   GERD without esophagitis   Sleep apnea   Tobacco abuse   Thrombocytopenia   Hyperglycemia   COPD with exacerbation   Acute respiratory failure    Plan:  Medications reviewed Will continue nebulizer, steroid and antibiotics Will change lasix to po Continue regular medications    LOS: 1 day   Jorge [name] 10/28/2014, 7:43 AM

## 2014-10-28 NOTE — Clinical Social Work Note (Signed)
Clinical Social Work Assessment  Patient Details  Name: Jorge [name] [facility] MRN: [id number] Date of Birth: 07/02/1965  Date of referral:  10/28/14               Reason for consult:  Facility Placement                Permission sought to share information with:    Permission granted to share information::     Name::        Agency::     Relationship::     Contact Information:     Housing/Transportation Living arrangements for the past 2 months:  [place] of Information:  Patient Patient Interpreter Needed:  None Criminal Activity/Legal Involvement Pertinent to Current Situation/Hospitalization:  No - Comment as needed Significant Relationships:  Siblings Lives with:  Facility Resident Do you feel safe going back to the place where you live?  Yes Need for family participation in patient care:  No (Coment)  Care giving concerns:  Pt is long term resident at family care home.   Social Worker assessment / plan:  CSW met with pt at bedside. Pt alert and oriented and known to CSW from recent admission. Pt reports he has been a resident at [facility] for about 4 years. He states his twin brother, Rodman Key is his best support and visits some at the facility. Pt said that it was difficult to adjust to being in a facility at first, but he now sees [facility] as "home" and gets along well with everyone there. Pt is fairly independent with ADLs. He plans to return to [facility] at d/c. Per Rise Paganini, no changes since last admission and okay to return. Pt was not receiving home health services.   Employment status:  Disabled (Comment on whether or not currently receiving Disability) Insurance information:  Medicare PT Recommendations:  Not assessed at this time Information / Referral to community resources:  Other (Comment Required) (Return to [employer])  Patient/Family's Response to care:  Pt requests to return to [facility] at d/c.   Patient/Family's  Understanding of and Emotional Response to Diagnosis, Current Treatment, and Prognosis:  Pt understands diagnosis and need for continued hospital stay, but is eager to d/c back to [facility] when ready.   Emotional Assessment Appearance:  Appears older than stated age Attitude/Demeanor/Rapport:  Other (Cooperative) Affect (typically observed):  Pleasant, Appropriate Orientation:  Oriented to Self, Oriented to Place, Oriented to  Time, Oriented to Situation Alcohol / Substance use:  Not Applicable Psych involvement (Current and /or in the community):  No (Comment)  Discharge Needs  Concerns to be addressed:  Discharge Planning Concerns Readmission within the last 30 days:  Yes Current discharge risk:  Chronically ill Barriers to Discharge:  Continued Medical Work up   [employer], [employer], [place] 10/28/2014, 10:09 AM [phone]

## 2014-10-29 LAB — GLUCOSE, CAPILLARY
GLUCOSE-CAPILLARY: 133 mg/dL — AB (ref 65–99)
GLUCOSE-CAPILLARY: 183 mg/dL — AB (ref 65–99)
Glucose-Capillary: 111 mg/dL — ABNORMAL HIGH (ref 65–99)

## 2014-10-29 MED ORDER — IPRATROPIUM-ALBUTEROL 0.5-2.5 (3) MG/3ML IN SOLN
3.0000 mL | Freq: Three times a day (TID) | RESPIRATORY_TRACT | Status: DC
  Administered 2014-10-30 – 2014-11-01 (×8): 3 mL via RESPIRATORY_TRACT
  Filled 2014-10-29 (×8): qty 3

## 2014-10-29 MED ORDER — NICOTINE 21 MG/24HR TD PT24
21.0000 mg | MEDICATED_PATCH | Freq: Every day | TRANSDERMAL | Status: DC
  Administered 2014-10-29 – 2014-11-01 (×4): 21 mg via TRANSDERMAL
  Filled 2014-10-29 (×5): qty 1

## 2014-10-29 NOTE — Progress Notes (Signed)
Subjective: Patient feels better today. He is less short of breath. He still wheezing and coughing..  Objective: Vital signs in last 24 hours: Temp:  [97.8 F (36.6 C)] 97.8 F (36.6 C) (06/15 2048) Pulse Rate:  [65-79] 65 (06/16 0603) Resp:  [16-22] 19 (06/16 0603) BP: (110-130)/(42-66) 110/42 mmHg (06/16 0603) SpO2:  [94 %-98 %] 98 % (06/16 0724) Weight:  [150.776 kg (332 lb 6.4 oz)-152.771 kg (336 lb 12.8 oz)] 150.776 kg (332 lb 6.4 oz) (06/16 0603) Weight change: 3.084 kg (6 lb 12.8 oz) Last BM Date: 10/28/14  Intake/Output from previous day: 06/15 0701 - 06/16 0700 In: 720 [P.O.:720] Out: 3250 [Urine:3250]  PHYSICAL EXAM General appearance: alert and no distress Resp: clear to auscultation bilaterally Cardio: S1, S2 normal GI: soft, non-tender; bowel sounds normal; no masses,  no organomegaly Extremities: extremities normal, atraumatic, no cyanosis or edema  Lab Results:  Results for orders placed or performed during the hospital encounter of 10/27/14 (from the past 48 hour(s))  CBC with Differential     Status: Abnormal   Collection Time: 10/27/14 11:31 AM  Result Value Ref Range   WBC 5.5 4.0 - 10.5 K/uL   RBC 4.13 (L) 4.22 - 5.81 MIL/uL   Hemoglobin 14.1 13.0 - 17.0 g/dL   HCT 43.0 39.0 - 52.0 %   MCV 104.1 (H) 78.0 - 100.0 fL   MCH 34.1 (H) 26.0 - 34.0 pg   MCHC 32.8 30.0 - 36.0 g/dL   RDW 15.3 11.5 - 15.5 %   Platelets 71 (L) 150 - 400 K/uL    Comment: SPECIMEN CHECKED FOR CLOTS PLATELET COUNT CONFIRMED BY SMEAR    Neutrophils Relative % 80 (H) 43 - 77 %   Neutro Abs 4.4 1.7 - 7.7 K/uL   Lymphocytes Relative 14 12 - 46 %   Lymphs Abs 0.7 0.7 - 4.0 K/uL   Monocytes Relative 6 3 - 12 %   Monocytes Absolute 0.3 0.1 - 1.0 K/uL   Eosinophils Relative 1 0 - 5 %   Eosinophils Absolute 0.0 0.0 - 0.7 K/uL   Basophils Relative 0 0 - 1 %   Basophils Absolute 0.0 0.0 - 0.1 K/uL  Comprehensive metabolic panel     Status: Abnormal   Collection Time: 10/27/14 11:31  AM  Result Value Ref Range   Sodium 140 135 - 145 mmol/L   Potassium 4.9 3.5 - 5.1 mmol/L   Chloride 102 101 - 111 mmol/L   CO2 29 22 - 32 mmol/L   Glucose, Bld 164 (H) 65 - 99 mg/dL   BUN 24 (H) 6 - 20 mg/dL   Creatinine, Ser 1.51 (H) 0.61 - 1.24 mg/dL   Calcium 8.4 (L) 8.9 - 10.3 mg/dL   Total Protein 6.7 6.5 - 8.1 g/dL   Albumin 3.7 3.5 - 5.0 g/dL   AST 19 15 - 41 U/L   ALT 26 17 - 63 U/L   Alkaline Phosphatase 78 38 - 126 U/L   Total Bilirubin 1.1 0.3 - 1.2 mg/dL   GFR calc non Af Amer 52 (L) >60 mL/min   GFR calc Af Amer >60 >60 mL/min    Comment: (NOTE) The eGFR has been calculated using the CKD EPI equation. This calculation has not been validated in all clinical situations. eGFR's persistently <60 mL/min signify possible Chronic Kidney Disease.    Anion gap 9 5 - 15  Brain natriuretic peptide     Status: Abnormal   Collection Time: 10/27/14 11:31 AM  Result Value Ref Range   B Natriuretic Peptide 136.0 (H) 0.0 - 100.0 pg/mL  Troponin I     Status: None   Collection Time: 10/27/14 11:31 AM  Result Value Ref Range   Troponin I <0.03 <0.031 ng/mL    Comment:        NO INDICATION OF MYOCARDIAL INJURY.   Hepatic function panel     Status: None   Collection Time: 10/27/14 11:31 AM  Result Value Ref Range   Total Protein 6.8 6.5 - 8.1 g/dL   Albumin 3.7 3.5 - 5.0 g/dL   AST 21 15 - 41 U/L   ALT 26 17 - 63 U/L   Alkaline Phosphatase 75 38 - 126 U/L   Total Bilirubin 1.1 0.3 - 1.2 mg/dL   Bilirubin, Direct 0.3 0.1 - 0.5 mg/dL   Indirect Bilirubin 0.8 0.3 - 0.9 mg/dL  Influenza panel by PCR (type A & B, H1N1) (Not at [facility])     Status: None   Collection Time: 10/27/14  6:30 PM  Result Value Ref Range   Influenza A By PCR NEGATIVE NEGATIVE   Influenza B By PCR NEGATIVE NEGATIVE   H1N1 flu by pcr NOT DETECTED NOT DETECTED    Comment:        The Xpert Flu assay (FDA approved for nasal aspirates or washes and nasopharyngeal swab specimens), is intended as an aid in  the diagnosis of influenza and should not be used as a sole basis for treatment.   Glucose, capillary     Status: Abnormal   Collection Time: 10/27/14 10:37 PM  Result Value Ref Range   Glucose-Capillary 188 (H) 65 - 99 mg/dL   Comment 1 Notify RN    Comment 2 Document in Chart   Basic metabolic panel     Status: Abnormal   Collection Time: 10/28/14  6:31 AM  Result Value Ref Range   Sodium 139 135 - 145 mmol/L   Potassium 4.6 3.5 - 5.1 mmol/L   Chloride 99 (L) 101 - 111 mmol/L   CO2 32 22 - 32 mmol/L   Glucose, Bld 162 (H) 65 - 99 mg/dL   BUN 24 (H) 6 - 20 mg/dL   Creatinine, Ser 1.29 (H) 0.61 - 1.24 mg/dL   Calcium 8.5 (L) 8.9 - 10.3 mg/dL   GFR calc non Af Amer >60 >60 mL/min   GFR calc Af Amer >60 >60 mL/min    Comment: (NOTE) The eGFR has been calculated using the CKD EPI equation. This calculation has not been validated in all clinical situations. eGFR's persistently <60 mL/min signify possible Chronic Kidney Disease.    Anion gap 8 5 - 15  CBC     Status: Abnormal   Collection Time: 10/28/14  6:31 AM  Result Value Ref Range   WBC 4.4 4.0 - 10.5 K/uL   RBC 3.92 (L) 4.22 - 5.81 MIL/uL   Hemoglobin 13.4 13.0 - 17.0 g/dL   HCT 40.5 39.0 - 52.0 %   MCV 103.3 (H) 78.0 - 100.0 fL   MCH 34.2 (H) 26.0 - 34.0 pg   MCHC 33.1 30.0 - 36.0 g/dL   RDW 14.8 11.5 - 15.5 %   Platelets 78 (L) 150 - 400 K/uL    Comment: SPECIMEN CHECKED FOR CLOTS CONSISTENT WITH PREVIOUS RESULT   Glucose, capillary     Status: Abnormal   Collection Time: 10/28/14  7:47 AM  Result Value Ref Range   Glucose-Capillary 175 (H) 65 - 99 mg/dL  Glucose, capillary     Status: Abnormal   Collection Time: 10/28/14 11:17 AM  Result Value Ref Range   Glucose-Capillary 180 (H) 65 - 99 mg/dL   Comment 1 Notify RN    Comment 2 Document in Chart   Glucose, capillary     Status: Abnormal   Collection Time: 10/28/14  4:24 PM  Result Value Ref Range   Glucose-Capillary 140 (H) 65 - 99 mg/dL  Glucose,  capillary     Status: Abnormal   Collection Time: 10/28/14  8:58 PM  Result Value Ref Range   Glucose-Capillary 112 (H) 65 - 99 mg/dL   Comment 1 Notify RN    Comment 2 Document in Chart     ABGS No results for input(s): PHART, PO2ART, TCO2, HCO3 in the last 72 hours.  Invalid input(s): PCO2 CULTURES No results found for this or any previous visit (from the past 240 hour(s)). Studies/Results: Dg Chest 1 View  10/27/2014   CLINICAL DATA:  Shortness of breath, bilateral leg swelling, history of cirrhosis  EXAM: CHEST  1 VIEW  COMPARISON:  10/03/2014  FINDINGS: Cardiomegaly again noted. The study is limited by patient's large body habitus. No convincing infiltrate or pulmonary edema.  IMPRESSION: No active disease. Cardiomegaly again noted. Suboptimal study due to patient's large body habitus.   Electronically Signed   By: Lahoma Crocker M.D.   On: 10/27/2014 11:37    Medications: I have reviewed the patient's current medications.  Assesment:   Principal Problem:   Acute respiratory failure with hypoxia Active Problems:   COPD exacerbation   Cirrhosis   CKD (chronic kidney disease)   Essential hypertension   GERD without esophagitis   Sleep apnea   Tobacco abuse   Thrombocytopenia   Hyperglycemia   COPD with exacerbation   Acute respiratory failure    Plan:  Medications reviewed Will continue nebulizer, steroid and antibiotics Will change lasix to po Nicotine patch 21 mg/day   LOS: 2 days   , 10/29/2014, 8:01 AM

## 2014-10-30 LAB — GLUCOSE, CAPILLARY
GLUCOSE-CAPILLARY: 148 mg/dL — AB (ref 65–99)
GLUCOSE-CAPILLARY: 152 mg/dL — AB (ref 65–99)
Glucose-Capillary: 151 mg/dL — ABNORMAL HIGH (ref 65–99)
Glucose-Capillary: 138 mg/dL — ABNORMAL HIGH (ref 65–99)

## 2014-10-30 NOTE — Progress Notes (Signed)
Subjective: Patient is gradually improving. However, continue cough and wheeze. No fever or chills.  Objective: Vital signs in last 24 hours: Temp:  [97.5 F (36.4 C)-98.2 F (36.8 C)] 98.2 F (36.8 C) (06/17 0551) Pulse Rate:  [58-73] 58 (06/17 0551) Resp:  [20] 20 (06/17 0551) BP: (102-112)/(60-65) 103/60 mmHg (06/17 0551) SpO2:  [93 %-98 %] 98 % (06/17 0703) Weight:  [150.23 kg (331 lb 3.2 oz)] 150.23 kg (331 lb 3.2 oz) (06/17 0551) Weight change: -2.542 kg (-5 lb 9.7 oz) Last BM Date: 10/27/14  Intake/Output from previous day: 06/16 0701 - 06/17 0700 In: 1446 [P.O.:1440; I.V.:6] Out: 4325 [Urine:4325]  PHYSICAL EXAM General appearance: alert and no distress Resp: diminished breath sounds bilaterally and wheezes bilaterally Cardio: S1, S2 normal GI: soft, non-tender; bowel sounds normal; no masses,  no organomegaly Extremities: extremities normal, atraumatic, no cyanosis or edema  Lab Results:  Results for orders placed or performed during the hospital encounter of 10/27/14 (from the past 48 hour(s))  Glucose, capillary     Status: Abnormal   Collection Time: 10/28/14 11:17 AM  Result Value Ref Range   Glucose-Capillary 180 (H) 65 - 99 mg/dL   Comment 1 Notify RN    Comment 2 Document in Chart   Glucose, capillary     Status: Abnormal   Collection Time: 10/28/14  4:24 PM  Result Value Ref Range   Glucose-Capillary 140 (H) 65 - 99 mg/dL  Glucose, capillary     Status: Abnormal   Collection Time: 10/28/14  8:58 PM  Result Value Ref Range   Glucose-Capillary 112 (H) 65 - 99 mg/dL   Comment 1 Notify RN    Comment 2 Document in Chart   Glucose, capillary     Status: Abnormal   Collection Time: 10/29/14  7:33 AM  Result Value Ref Range   Glucose-Capillary 133 (H) 65 - 99 mg/dL  Glucose, capillary     Status: Abnormal   Collection Time: 10/29/14 11:18 AM  Result Value Ref Range   Glucose-Capillary 111 (H) 65 - 99 mg/dL  Glucose, capillary     Status: Abnormal   Collection Time: 10/29/14  8:46 PM  Result Value Ref Range   Glucose-Capillary 183 (H) 65 - 99 mg/dL   Comment 1 Notify RN    Comment 2 Document in Chart   Glucose, capillary     Status: Abnormal   Collection Time: 10/30/14  7:46 AM  Result Value Ref Range   Glucose-Capillary 151 (H) 65 - 99 mg/dL   Comment 1 Notify RN    Comment 2 Document in Chart     ABGS No results for input(s): PHART, PO2ART, TCO2, HCO3 in the last 72 hours.  Invalid input(s): PCO2 CULTURES No results found for this or any previous visit (from the past 240 hour(s)). Studies/Results: No results found.  Medications: I have reviewed the patient's current medications.  Assesment:   Principal Problem:   Acute respiratory failure with hypoxia Active Problems:   COPD exacerbation   Cirrhosis   CKD (chronic kidney disease)   Essential hypertension   GERD without esophagitis   Sleep apnea   Tobacco abuse   Thrombocytopenia   Hyperglycemia   COPD with exacerbation   Acute respiratory failure    Plan:  Medications reviewed Will continue nebulizer, steroid and antibiotics Will change lasix to po Nicotine patch 21 mg/day   LOS: 3 days   Jorge [name] 10/30/2014, 8:13 AM

## 2014-10-30 NOTE — Clinical Social Work Note (Signed)
CSW updated [place] on pt. She is aware that pt should d/c this weekend per MD and is agreeable.  Benay Pike, [place]

## 2014-10-30 NOTE — Care Management Note (Signed)
Case Management Note  Patient Details  Name: Jorge [name] MRN: [id number] Date of Birth: 1965/10/06  Subjective/Objective:                    Action/Plan:   Expected Discharge Date:                  Expected Discharge Plan:  Assisted Living / Rest Home  In-House Referral:  Clinical Social Work  Discharge planning Services  CM Consult  Post Acute Care Choice:  Durable Medical Equipment Choice offered to:     DME Arranged:  [facility] D[facility] Agency:  [place]:    [name] Agency:     Status of Service:  In process, will continue to follow  Medicare Important Message Given:  Yes Date Medicare IM Given:  10/30/14 Medicare IM give by:  Christinia Gully, RN BSN CM Date Additional Medicare IM Given:    Additional Medicare Important Message give by:     If discussed at [place] of Stay Meetings, dates discussed:    Additional Comments: Anticipate discharge over the weekend back to [facility]. Pt neb machine ordered from [facility] and will be delivered to the facility. Pt will need home O2 assessment prior to discharge. If pt qualifies weekend staff can arrange O2 with [place]. If pt needs [place] weekend staff can arrange with [name] as well. Christinia Gully [place], RN 10/30/2014, 10:38 AM

## 2014-10-31 LAB — GLUCOSE, CAPILLARY
GLUCOSE-CAPILLARY: 183 mg/dL — AB (ref 65–99)
GLUCOSE-CAPILLARY: 155 mg/dL — AB (ref 65–99)
GLUCOSE-CAPILLARY: 131 mg/dL — AB (ref 65–99)
Glucose-Capillary: 128 mg/dL — ABNORMAL HIGH (ref 65–99)
Glucose-Capillary: 147 mg/dL — ABNORMAL HIGH (ref 65–99)

## 2014-10-31 MED ORDER — METHYLPREDNISOLONE SODIUM SUCC 40 MG IJ SOLR
40.0000 mg | Freq: Two times a day (BID) | INTRAMUSCULAR | Status: DC
  Administered 2014-10-31 – 2014-11-01 (×3): 40 mg via INTRAVENOUS
  Filled 2014-10-31 (×3): qty 1

## 2014-10-31 NOTE — Progress Notes (Signed)
Subjective: Patient feels better. His breathing is improving. Less wheezing and cough..  Objective: Vital signs in last 24 hours: Temp:  [97.5 F (36.4 C)-98.1 F (36.7 C)] 97.5 F (36.4 C) (06/18 0601) Pulse Rate:  [68-71] 68 (06/18 0601) Resp:  [20] 20 (06/18 0601) BP: (108-144)/(61-75) 144/75 mmHg (06/18 0601) SpO2:  [92 %-100 %] 100 % (06/18 0601) Weight:  [150 kg (330 lb 11 oz)] 150 kg (330 lb 11 oz) (06/18 0500) Weight change: -0.23 kg (-8.1 oz) Last BM Date: 10/27/14  Intake/Output from previous day: 06/17 0701 - 06/18 0700 In: 720 [P.O.:720] Out: 3550 [Urine:3550]  PHYSICAL EXAM General appearance: alert and no distress Resp: diminished breath sounds bilaterally and wheezes bilaterally Cardio: S1, S2 normal GI: soft, non-tender; bowel sounds normal; no masses,  no organomegaly Extremities: extremities normal, atraumatic, no cyanosis or edema  Lab Results:  Results for orders placed or performed during the hospital encounter of 10/27/14 (from the past 48 hour(s))  Glucose, capillary     Status: Abnormal   Collection Time: 10/29/14 11:18 AM  Result Value Ref Range   Glucose-Capillary 111 (H) 65 - 99 mg/dL  Glucose, capillary     Status: Abnormal   Collection Time: 10/29/14  8:46 PM  Result Value Ref Range   Glucose-Capillary 183 (H) 65 - 99 mg/dL   Comment 1 Notify RN    Comment 2 Document in Chart   Glucose, capillary     Status: Abnormal   Collection Time: 10/30/14  7:46 AM  Result Value Ref Range   Glucose-Capillary 151 (H) 65 - 99 mg/dL   Comment 1 Notify RN    Comment 2 Document in Chart   Glucose, capillary     Status: Abnormal   Collection Time: 10/30/14 11:23 AM  Result Value Ref Range   Glucose-Capillary 148 (H) 65 - 99 mg/dL   Comment 1 Notify RN    Comment 2 Document in Chart   Glucose, capillary     Status: Abnormal   Collection Time: 10/30/14  4:38 PM  Result Value Ref Range   Glucose-Capillary 152 (H) 65 - 99 mg/dL   Comment 1 Notify RN     Comment 2 Document in Chart   Glucose, capillary     Status: Abnormal   Collection Time: 10/30/14  9:05 PM  Result Value Ref Range   Glucose-Capillary 138 (H) 65 - 99 mg/dL   Comment 1 Notify RN    Comment 2 Document in Chart     ABGS No results for input(s): PHART, PO2ART, TCO2, HCO3 in the last 72 hours.  Invalid input(s): PCO2 CULTURES No results found for this or any previous visit (from the past 240 hour(s)). Studies/Results: No results found.  Medications: I have reviewed the patient's current medications.  Assesment:   Principal Problem:   Acute respiratory failure with hypoxia Active Problems:   COPD exacerbation   Cirrhosis   CKD (chronic kidney disease)   Essential hypertension   GERD without esophagitis   Sleep apnea   Tobacco abuse   Thrombocytopenia   Hyperglycemia   COPD with exacerbation   Acute respiratory failure    Plan:  Medications reviewed Will continue nebulizer, steroid and antibiotics Will taper solumedrol Nicotine patch 21 mg/day   LOS: 4 days   Aleaha Fickling 10/31/2014, 7:40 AM

## 2014-11-01 LAB — CBC
HCT: 45.6 % (ref 39.0–52.0)
HEMOGLOBIN: 14.8 g/dL (ref 13.0–17.0)
MCH: 33.6 pg (ref 26.0–34.0)
MCHC: 32.5 g/dL (ref 30.0–36.0)
MCV: 103.4 fL — AB (ref 78.0–100.0)
Platelets: 74 10*3/uL — ABNORMAL LOW (ref 150–400)
RBC: 4.41 MIL/uL (ref 4.22–5.81)
RDW: 14.2 % (ref 11.5–15.5)
WBC: 6.9 10*3/uL (ref 4.0–10.5)

## 2014-11-01 LAB — BASIC METABOLIC PANEL
Anion gap: 8 (ref 5–15)
BUN: 41 mg/dL — AB (ref 6–20)
CALCIUM: 8.8 mg/dL — AB (ref 8.9–10.3)
CO2: 34 mmol/L — AB (ref 22–32)
CREATININE: 1.3 mg/dL — AB (ref 0.61–1.24)
Chloride: 94 mmol/L — ABNORMAL LOW (ref 101–111)
GFR calc Af Amer: >60 mL/min (ref >60)
GFR calc non Af Amer: >60 mL/min (ref >60)
Glucose, Bld: 171 mg/dL — ABNORMAL HIGH (ref 65–99)
Potassium: 5.1 mmol/L (ref 3.5–5.1)
Sodium: 136 mmol/L (ref 135–145)

## 2014-11-01 LAB — GLUCOSE, CAPILLARY
GLUCOSE-CAPILLARY: 126 mg/dL — AB (ref 65–99)
Glucose-Capillary: 149 mg/dL — ABNORMAL HIGH (ref 65–99)

## 2014-11-01 MED ORDER — PREDNISONE 10 MG (21) PO TBPK: 10.0000 mg | ORAL_TABLET | Freq: Every day | ORAL | Status: DC

## 2014-11-01 MED ORDER — LEVOFLOXACIN 750 MG PO TABS: 750.0000 mg | ORAL_TABLET | Freq: Every day | ORAL | Status: DC

## 2014-11-01 MED ORDER — IPRATROPIUM-ALBUTEROL 0.5-2.5 (3) MG/3ML IN SOLN: 3.0000 mL | Freq: Three times a day (TID) | RESPIRATORY_TRACT | Status: AC

## 2014-11-01 MED ORDER — NICOTINE 21 MG/24HR TD PT24: 21.0000 mg | MEDICATED_PATCH | Freq: Every day | TRANSDERMAL | Status: DC

## 2014-11-01 NOTE — Discharge Summary (Signed)
Physician Discharge Summary  Patient ID: Jorge [name] [facility] MRN: [id number] DOB/AGE: 51-Nov-1967 51 y.o. Primary Care Physician:Hadasah Brugger, MD Admit date: 10/27/2014 Discharge date: 11/01/2014    Discharge Diagnoses:   Principal Problem:   Acute respiratory failure with hypoxia Active Problems:   COPD exacerbation   Cirrhosis   CKD (chronic kidney disease)   Essential hypertension   GERD without esophagitis   Sleep apnea   Tobacco abuse   Thrombocytopenia   Hyperglycemia   COPD with exacerbation   Acute respiratory failure     Medication List    STOP taking these medications        ciprofloxacin 750 MG tablet  Commonly known as:  CIPRO      TAKE these medications        ARIPiprazole 30 MG tablet  Commonly known as:  ABILIFY  Take 30 mg by mouth at bedtime.     citalopram 40 MG tablet  Commonly known as:  CELEXA  Take 40 mg by mouth at bedtime.     dextromethorphan 30 MG/5ML liquid  Commonly known as:  DELSYM  Take 30 mg by mouth every 12 (twelve) hours.     furosemide 40 MG tablet  Commonly known as:  LASIX  Take 40 mg by mouth daily.     gabapentin 300 MG capsule  Commonly known as:  NEURONTIN  Take 300 mg by mouth 3 (three) times daily.     ipratropium-albuterol 0.5-2.5 (3) MG/3ML Soln  Commonly known as:  DUONEB  Take 3 mLs by nebulization 3 (three) times daily.     levofloxacin 750 MG tablet  Commonly known as:  LEVAQUIN  Take 1 tablet (750 mg total) by mouth daily.     metoprolol succinate 50 MG 24 hr tablet  Commonly known as:  TOPROL-XL  Take 50 mg by mouth daily. Take with or immediately following a meal.     nicotine 21 mg/24hr patch  Commonly known as:  NICODERM CQ - dosed in mg/24 hours  Place 1 patch (21 mg total) onto the skin daily.     oxyCODONE 5 MG immediate release tablet  Commonly known as:  Oxy IR/ROXICODONE  Take 5 mg by mouth every 6 (six) hours as needed for severe pain.     predniSONE 10 MG (21) Tbpk tablet  Commonly  known as:  STERAPRED UNI-PAK 21 TAB  Take 1 tablet (10 mg total) by mouth daily. 4 tab po daily for 3 days, then 3 tab po daily for 3 days, then 2 tab po daily for 3 days, then 1 tab po daily for 3 days then stop     PROAIR HFA 108 (90 BASE) MCG/ACT inhaler  Generic drug:  albuterol  Inhale 2 puffs into the lungs every 6 (six) hours as needed for wheezing or shortness of breath. Shortness of breath/wheeze     spironolactone 25 MG tablet  Commonly known as:  ALDACTONE  Take 25 mg by mouth 2 (two) times daily.     SYMBICORT 160-4.5 MCG/ACT inhaler  Generic drug:  budesonide-formoterol  Inhale 1 puff into the lungs 2 (two) times daily.     Tiotropium Bromide Monohydrate 2.5 MCG/ACT Aers  Inhale 2 puffs into the lungs daily.     traZODone 150 MG tablet  Commonly known as:  DESYREL  Take 300 mg by mouth at bedtime.     vitamin B-12 500 MCG tablet  Commonly known as:  CYANOCOBALAMIN  Take 500 mcg by mouth daily.  XIFAXAN 550 MG Tabs tablet  Generic drug:  rifaximin  Take 550 mg by mouth 2 (two) times daily.        Discharged Condition: improved    Consults: None  Significant Diagnostic Studies: Dg Chest 1 View  10/27/2014   CLINICAL DATA:  Shortness of breath, bilateral leg swelling, history of cirrhosis  EXAM: CHEST  1 VIEW  COMPARISON:  10/03/2014  FINDINGS: Cardiomegaly again noted. The study is limited by patient's large body habitus. No convincing infiltrate or pulmonary edema.  IMPRESSION: No active disease. Cardiomegaly again noted. Suboptimal study due to patient's large body habitus.   Electronically Signed   By: Lahoma Crocker M.D.   On: 10/27/2014 11:37   Dg Chest Portable 1 View  10/03/2014   CLINICAL DATA:  Shortness of breath, bronchitis, COPD.  EXAM: PORTABLE CHEST - 1 VIEW  COMPARISON:  08/28/2016  FINDINGS: There is cardiomegaly. No confluent airspace opacity or effusion. No edema. No acute bony abnormality.  IMPRESSION: Mild cardiomegaly.  No active disease.    Electronically Signed   By: Rolm Baptise M.D.   On: 10/03/2014 18:09    Lab Results: Basic Metabolic Panel:  Recent Labs  11/01/14 0542  NA 136  K 5.1  CL 94*  CO2 34*  GLUCOSE 171*  BUN 41*  CREATININE 1.30*  CALCIUM 8.8*   Liver Function Tests: No results for input(s): AST, ALT, ALKPHOS, BILITOT, PROT, ALBUMIN in the last 72 hours.   CBC:  Recent Labs  11/01/14 0542  WBC 6.9  HGB 14.8  HCT 45.6  MCV 103.4*  PLT 74*    No results found for this or any previous visit (from the past 240 hour(s)).   Hospital Course:   This is a 49 years old male with history of multiple medical illnesses was admitted due acute exacerbations of COPD. He has been in and out of hospital with the same problem. He continued to smoke tobacco. He was treated with nebulizer treatment, antibiotics and IV steroid. Patient improved on nebulizer, oral steroid and oral antibiotics. Patient is strongly advised to stop tobacco smoking and nicotine patch prescribed.  Discharge Exam: Blood pressure 139/71, pulse 66, temperature 97.7 F (36.5 C), temperature source Oral, resp. rate 20, height 6\' 3"  (1.905 m), weight 145.877 kg (321 lb 9.6 oz), SpO2 94 %.   Disposition:  Assisted living        Follow-up Information    Follow up with [facility], MD In 1 week.   Specialty:  Internal Medicine   Contact information:   [place] [address] [phone]       Signed: Rosita Fire   11/01/2014, 10:28 AM

## 2014-11-02 DIAGNOSIS — E669 Obesity, unspecified: Secondary | ICD-10-CM | POA: Diagnosis not present

## 2014-11-02 DIAGNOSIS — J449 Chronic obstructive pulmonary disease, unspecified: Secondary | ICD-10-CM | POA: Diagnosis not present

## 2014-11-12 DIAGNOSIS — F17200 Nicotine dependence, unspecified, uncomplicated: Secondary | ICD-10-CM | POA: Diagnosis not present

## 2014-11-12 DIAGNOSIS — K7030 Alcoholic cirrhosis of liver without ascites: Secondary | ICD-10-CM | POA: Diagnosis not present

## 2014-11-12 DIAGNOSIS — J449 Chronic obstructive pulmonary disease, unspecified: Secondary | ICD-10-CM | POA: Diagnosis not present

## 2014-12-07 ENCOUNTER — Emergency Department (HOSPITAL_COMMUNITY)
Admission: EM | Admit: 2014-12-07 | Discharge: 2014-12-07 | Disposition: A | Discharge status: Other Acute Inpatient Hospital | Payer: Medicare Other | Attending: Emergency Medicine | Admitting: Emergency Medicine

## 2014-12-07 ENCOUNTER — Encounter (HOSPITAL_COMMUNITY): Payer: Self-pay | Admitting: *Deleted

## 2014-12-07 ENCOUNTER — Emergency Department (HOSPITAL_COMMUNITY): Payer: Medicare Other

## 2014-12-07 DIAGNOSIS — S82252A Displaced comminuted fracture of shaft of left tibia, initial encounter for closed fracture: Secondary | ICD-10-CM | POA: Diagnosis not present

## 2014-12-07 DIAGNOSIS — Z880 Allergy status to penicillin: Secondary | ICD-10-CM | POA: Insufficient documentation

## 2014-12-07 DIAGNOSIS — S82192A Other fracture of upper end of left tibia, initial encounter for closed fracture: Secondary | ICD-10-CM | POA: Diagnosis not present

## 2014-12-07 DIAGNOSIS — S72421A Displaced fracture of lateral condyle of right femur, initial encounter for closed fracture: Secondary | ICD-10-CM | POA: Diagnosis not present

## 2014-12-07 DIAGNOSIS — Z915 Personal history of self-harm: Secondary | ICD-10-CM | POA: Insufficient documentation

## 2014-12-07 DIAGNOSIS — N182 Chronic kidney disease, stage 2 (mild): Secondary | ICD-10-CM | POA: Insufficient documentation

## 2014-12-07 DIAGNOSIS — M79605 Pain in left leg: Secondary | ICD-10-CM | POA: Diagnosis not present

## 2014-12-07 DIAGNOSIS — S82832A Other fracture of upper and lower end of left fibula, initial encounter for closed fracture: Secondary | ICD-10-CM | POA: Diagnosis not present

## 2014-12-07 DIAGNOSIS — R55 Syncope and collapse: Secondary | ICD-10-CM | POA: Insufficient documentation

## 2014-12-07 DIAGNOSIS — S299XXA Unspecified injury of thorax, initial encounter: Secondary | ICD-10-CM | POA: Diagnosis not present

## 2014-12-07 DIAGNOSIS — Z7952 Long term (current) use of systemic steroids: Secondary | ICD-10-CM | POA: Diagnosis not present

## 2014-12-07 DIAGNOSIS — I129 Hypertensive chronic kidney disease with stage 1 through stage 4 chronic kidney disease, or unspecified chronic kidney disease: Secondary | ICD-10-CM | POA: Diagnosis not present

## 2014-12-07 DIAGNOSIS — Y9389 Activity, other specified: Secondary | ICD-10-CM | POA: Diagnosis not present

## 2014-12-07 DIAGNOSIS — S82142A Displaced bicondylar fracture of left tibia, initial encounter for closed fracture: Secondary | ICD-10-CM | POA: Diagnosis not present

## 2014-12-07 DIAGNOSIS — W01198A Fall on same level from slipping, tripping and stumbling with subsequent striking against other object, initial encounter: Secondary | ICD-10-CM | POA: Insufficient documentation

## 2014-12-07 DIAGNOSIS — Y92008 Other place in unspecified non-institutional (private) residence as the place of occurrence of the external cause: Secondary | ICD-10-CM | POA: Diagnosis not present

## 2014-12-07 DIAGNOSIS — S72412A Displaced unspecified condyle fracture of lower end of left femur, initial encounter for closed fracture: Secondary | ICD-10-CM

## 2014-12-07 DIAGNOSIS — M25562 Pain in left knee: Secondary | ICD-10-CM | POA: Diagnosis not present

## 2014-12-07 DIAGNOSIS — Z8669 Personal history of other diseases of the nervous system and sense organs: Secondary | ICD-10-CM | POA: Insufficient documentation

## 2014-12-07 DIAGNOSIS — S82392A Other fracture of lower end of left tibia, initial encounter for closed fracture: Secondary | ICD-10-CM | POA: Diagnosis not present

## 2014-12-07 DIAGNOSIS — Z720 Tobacco use: Secondary | ICD-10-CM | POA: Insufficient documentation

## 2014-12-07 DIAGNOSIS — Y998 Other external cause status: Secondary | ICD-10-CM | POA: Insufficient documentation

## 2014-12-07 DIAGNOSIS — Z8719 Personal history of other diseases of the digestive system: Secondary | ICD-10-CM | POA: Diagnosis not present

## 2014-12-07 DIAGNOSIS — T79A22A Traumatic compartment syndrome of left lower extremity, initial encounter: Secondary | ICD-10-CM | POA: Diagnosis not present

## 2014-12-07 DIAGNOSIS — M7989 Other specified soft tissue disorders: Secondary | ICD-10-CM | POA: Diagnosis not present

## 2014-12-07 DIAGNOSIS — S8992XA Unspecified injury of left lower leg, initial encounter: Secondary | ICD-10-CM | POA: Diagnosis present

## 2014-12-07 DIAGNOSIS — S82842A Displaced bimalleolar fracture of left lower leg, initial encounter for closed fracture: Secondary | ICD-10-CM | POA: Diagnosis not present

## 2014-12-07 DIAGNOSIS — Z79899 Other long term (current) drug therapy: Secondary | ICD-10-CM | POA: Diagnosis not present

## 2014-12-07 DIAGNOSIS — W19XXXA Unspecified fall, initial encounter: Secondary | ICD-10-CM | POA: Diagnosis not present

## 2014-12-07 DIAGNOSIS — J449 Chronic obstructive pulmonary disease, unspecified: Secondary | ICD-10-CM | POA: Diagnosis not present

## 2014-12-07 DIAGNOSIS — S82831A Other fracture of upper and lower end of right fibula, initial encounter for closed fracture: Secondary | ICD-10-CM | POA: Diagnosis not present

## 2014-12-07 DIAGNOSIS — S72422A Displaced fracture of lateral condyle of left femur, initial encounter for closed fracture: Secondary | ICD-10-CM | POA: Diagnosis not present

## 2014-12-07 DIAGNOSIS — W1830XA Fall on same level, unspecified, initial encounter: Secondary | ICD-10-CM | POA: Diagnosis not present

## 2014-12-07 DIAGNOSIS — I10 Essential (primary) hypertension: Secondary | ICD-10-CM | POA: Diagnosis not present

## 2014-12-07 DIAGNOSIS — S82102A Unspecified fracture of upper end of left tibia, initial encounter for closed fracture: Secondary | ICD-10-CM | POA: Diagnosis not present

## 2014-12-07 DIAGNOSIS — S82443A Displaced spiral fracture of shaft of unspecified fibula, initial encounter for closed fracture: Secondary | ICD-10-CM | POA: Diagnosis not present

## 2014-12-07 DIAGNOSIS — S82302A Unspecified fracture of lower end of left tibia, initial encounter for closed fracture: Secondary | ICD-10-CM | POA: Diagnosis not present

## 2014-12-07 DIAGNOSIS — R609 Edema, unspecified: Secondary | ICD-10-CM | POA: Diagnosis not present

## 2014-12-07 HISTORY — DX: Suicide attempt, initial encounter: T14.91XA

## 2014-12-07 HISTORY — DX: Syncope and collapse: R55

## 2014-12-07 LAB — BASIC METABOLIC PANEL
Anion gap: 9 (ref 5–15)
BUN: 39 mg/dL — ABNORMAL HIGH (ref 6–20)
CHLORIDE: 99 mmol/L — AB (ref 101–111)
CO2: 31 mmol/L (ref 22–32)
CREATININE: 1.79 mg/dL — AB (ref 0.61–1.24)
Calcium: 8.9 mg/dL (ref 8.9–10.3)
GFR calc Af Amer: 49 mL/min — ABNORMAL LOW (ref >60)
GFR calc non Af Amer: 43 mL/min — ABNORMAL LOW (ref >60)
Glucose, Bld: 121 mg/dL — ABNORMAL HIGH (ref 65–99)
Potassium: 4.8 mmol/L (ref 3.5–5.1)
Sodium: 139 mmol/L (ref 135–145)

## 2014-12-07 LAB — CBC WITH DIFFERENTIAL/PLATELET
BASOS ABS: 0 10*3/uL (ref 0.0–0.1)
BASOS PCT: 0 % (ref 0–1)
EOS ABS: 0 10*3/uL (ref 0.0–0.7)
Eosinophils Relative: 0 % (ref 0–5)
HCT: 40.9 % (ref 39.0–52.0)
Hemoglobin: 13.6 g/dL (ref 13.0–17.0)
LYMPHS ABS: 0.9 10*3/uL (ref 0.7–4.0)
Lymphocytes Relative: 9 % — ABNORMAL LOW (ref 12–46)
MCH: 34 pg (ref 26.0–34.0)
MCHC: 33.3 g/dL (ref 30.0–36.0)
MCV: 102.3 fL — AB (ref 78.0–100.0)
MONO ABS: 0.5 10*3/uL (ref 0.1–1.0)
Monocytes Relative: 5 % (ref 3–12)
Neutro Abs: 8.6 10*3/uL — ABNORMAL HIGH (ref 1.7–7.7)
Neutrophils Relative %: 85 % — ABNORMAL HIGH (ref 43–77)
Platelets: 99 10*3/uL — ABNORMAL LOW (ref 150–400)
RBC: 4 MIL/uL — ABNORMAL LOW (ref 4.22–5.81)
RDW: 14.8 % (ref 11.5–15.5)
WBC: 10.1 10*3/uL (ref 4.0–10.5)

## 2014-12-07 LAB — TROPONIN I: Troponin I: <0.03 ng/mL (ref <0.031)

## 2014-12-07 MED ORDER — HYDROMORPHONE HCL 1 MG/ML IJ SOLN
1.0000 mg | Freq: Once | INTRAMUSCULAR | Status: AC
  Administered 2014-12-07: 1 mg via INTRAVENOUS
  Filled 2014-12-07: qty 1

## 2014-12-07 MED ORDER — HYDROMORPHONE HCL 1 MG/ML IJ SOLN
1.0000 mg | Freq: Once | INTRAMUSCULAR | Status: AC
Start: 2014-12-07 — End: 2014-12-07
  Administered 2014-12-07: 1 mg via INTRAVENOUS
  Filled 2014-12-07: qty 1

## 2014-12-07 NOTE — Consult Note (Signed)
Reason for Consult:fracture of the left tibia, fibula proximally and distal lateral femoral condyle. Referring Physician: ER  Jorge [name] is an 49 y.o. male.  HPI: I was asked to see patient who had fallen at the group home today and hurt his left knee.  The ER physician was concerned about possible compartment syndrome.  The patient has no other injury.  He is in a cylinder knee immobilizer splint with no ice.  He has good distal pulses of the posterior tibialis and dorsalis pedis.  He has normal capillary refill of toes.  Toe nails need to be trimmed.  Sensation is intact.  His calf is soft.  He has proximal medial swelling of the proximal tibia but it is soft and has slight ecchymosis.  He has brawny skin changes of the mid shin area with no distal edema.  ROM of the ankle is good but it hurts his knee area some.  Dorsiflexion of the ankle is intact.  Past Medical History  Diagnosis Date  . COPD (chronic obstructive pulmonary disease)   . Cirrhosis sleep apnea  . Hypertension   . Renal disorder     stage II  . Sleep apnea   . Tobacco abuse   . Syncope   . Suicide attempt     Past Surgical History  Procedure Laterality Date  . Cholecystectomy      Family History  Problem Relation Age of Onset  . Heart disease Father   . Cancer Mother     breast    Social History:  reports that he has been smoking Cigarettes.  He has been smoking about 0.50 packs per day. He does not have any smokeless tobacco history on file. He reports that he does not drink alcohol or use illicit drugs.  Allergies:  Allergies  Allergen Reactions  . Penicillins Anaphylaxis    Patient states he is not allergic to anything;  Pt says it's his twin brother that has this allergy.    Medications: I have reviewed the patient's current medications.  Results for orders placed or performed during the hospital encounter of 12/07/14 (from the past 48 hour(s))  CBC with Differential/Platelet     Status:  Abnormal   Collection Time: 12/07/14  6:01 PM  Result Value Ref Range   WBC 10.1 4.0 - 10.5 K/uL   RBC 4.00 (L) 4.22 - 5.81 MIL/uL   Hemoglobin 13.6 13.0 - 17.0 g/dL   HCT 40.9 39.0 - 52.0 %   MCV 102.3 (H) 78.0 - 100.0 fL   MCH 34.0 26.0 - 34.0 pg   MCHC 33.3 30.0 - 36.0 g/dL   RDW 14.8 11.5 - 15.5 %   Platelets 99 (L) 150 - 400 K/uL    Comment: SPECIMEN CHECKED FOR CLOTS PLATELET COUNT CONFIRMED BY SMEAR LARGE PLATELETS PRESENT    Neutrophils Relative % 85 (H) 43 - 77 %   Neutro Abs 8.6 (H) 1.7 - 7.7 K/uL   Lymphocytes Relative 9 (L) 12 - 46 %   Lymphs Abs 0.9 0.7 - 4.0 K/uL   Monocytes Relative 5 3 - 12 %   Monocytes Absolute 0.5 0.1 - 1.0 K/uL   Eosinophils Relative 0 0 - 5 %   Eosinophils Absolute 0.0 0.0 - 0.7 K/uL   Basophils Relative 0 0 - 1 %   Basophils Absolute 0.0 0.0 - 0.1 K/uL    Dg Chest 2 View  12/07/2014   CLINICAL DATA:  Fall with lower leg pain.  EXAM: CHEST  2 VIEW  COMPARISON:  10/27/2014 as well as 10/03/2014 and 08/29/2014  FINDINGS: Patient slightly rotated to the left. Lungs are adequately inflated without focal consolidation or effusion. There is stable cardiomegaly. Remainder of the exam is unchanged.  IMPRESSION: No active cardiopulmonary disease.  Stable cardiomegaly.   Electronically Signed   By: Marin Olp M.D.   On: 12/07/2014 18:13   Dg Tibia/fibula Left  12/07/2014   CLINICAL DATA:  Fall.  Pain in lower leg.  EXAM: LEFT TIBIA AND FIBULA - 2 VIEW  COMPARISON:  None.  FINDINGS: There is a comminuted fracture of the proximal tibia, which extends from the level of the anterior tibial spines inferiorly, involving both medial lateral aspects. There is a small defect along the lateral femoral condyle. There is a fracture involving the proximal aspect of the fibula, minimally displaced. A nondisplaced probable fracture is noted the distal fibula. Distal tibia appears normal.  IMPRESSION: 1. Comminuted fracture of the proximal tibia involving proximal  joint. 2. Osteochondral fragment of the lateral condyle of the femur. 3. Proximal and distal fractures of the fibula.   Electronically Signed   By: Nolon Nations M.D.   On: 12/07/2014 18:14   Dg Knee Complete 4 Views Left  12/07/2014   CLINICAL DATA:  Pain in lower leg after fall  EXAM: LEFT KNEE - COMPLETE 4+ VIEW  COMPARISON:  None.  FINDINGS: There is a comminuted minimally displaced proximal tibial metaphyseal fracture which extends longitudinally up to the tibial spines and probably also reaches the articular surface of the lateral plateau without step-off. There is a transverse minimally displaced proximal fibular fracture. There is fragmentation of the lateral femoral condyle which may also represent an acute fracture, mildly depressed. No bone lesion or bony destruction is evident to suggest a pathologic basis for the fracture. No acute soft tissue abnormality is evident.  IMPRESSION: Comminuted minimally displaced proximal tibial fracture which extends into the tibial spines and may also reach the lateral plateau articular surface. Minimally displaced proximal fibular fracture. Fragmented lateral femoral condyle, possibly also an acute fracture.   Electronically Signed   By: Andreas Newport M.D.   On: 12/07/2014 18:12    Review of Systems  Constitutional:       Obesity  Respiratory:       COPD, sleep apnea, history of acute respiratory failure in past, history of smoking  Cardiovascular:       Hypertension, chest pains at times  Gastrointestinal: Positive for heartburn.       GERD without esophagitis, cirrhosis of liver  Genitourinary:       Renal insufficiency  Musculoskeletal: Positive for falls [place] today at group home around 4 pm.  Pain of the left knee.  Unable to bare weight.  No head injury.  No other injury.).  Endo/Heme/Allergies:       History of thrombocytopenia, hyperglycemia, insomina   Blood pressure 139/68, pulse 104, temperature 97.7 F (36.5 C), temperature  source Oral, resp. rate 23, height 6\' 3"  (1.905 m), weight 149.687 kg (330 lb), SpO2 100 %. Physical Exam  Constitutional: He is oriented to person, place, and time. He appears well-developed and well-nourished.  HENT:  Head: Normocephalic and atraumatic.  Eyes: Conjunctivae and EOM are normal. Pupils are equal, round, and reactive to light.  Neck: Normal range of motion. Neck supple.  Cardiovascular: Normal rate, regular rhythm and intact distal pulses.   Respiratory: Effort normal.  GI: Soft.  Musculoskeletal: He exhibits tenderness (Pain of the left  knee, more anterior and laterally.  Some medial swelling.  Brawny changes of the lower leg.  Good distal pules dorsalis pedis and posterior tibialis.  Good capillary refill.  Pain lateral distal femur and fibular head area.  Dorsiflexion O).  Neurological: He is alert and oriented to person, place, and time. He has normal reflexes.  Skin: Skin is warm and dry.  Psychiatric: He has a normal mood and affect. Judgment and thought content normal.    Assessment/Plan: Acute fractures of the proximal tibia, fibula nondisplaced but comminuted.  Fracture nondisplaced of distal femoral condyle.  Degenerative changes present of the knee joint.  He has swelling associated with the injury but I do not feel he has compartment syndrome now.  The area is soft, he has normal distal pulses, normal sensation and normal capillary refill.  He needs ice and immobilization.  His condition is stable.    I have told the ER physician I feel this injury needs to be transferred to the main [place] secondary to the type of fractures he has and I do not treat these and also because of the multiple medical issues the patient has.  He will most likely need surgical fixation of the tibial fracture.  Jorge [name] 12/07/2014, 6:51 PM

## 2014-12-07 NOTE — ED Notes (Signed)
Pt denies being on blood thinners.

## 2014-12-07 NOTE — ED Notes (Signed)
Pt states that he passed out completely at group home, states he has been having dizzy spells for over a month, hit head on tile floor at home, swelling to left lower leg

## 2014-12-07 NOTE — ED Provider Notes (Signed)
CSN: [id number]     Arrival date & time 12/07/14  1700 History   First MD Initiated Contact with Patient 12/07/14 1719     Chief Complaint  Patient presents with  . Loss of Consciousness  . Head Injury     (Consider location/radiation/quality/duration/timing/severity/associated sxs/prior Treatment) HPI Comments:  Patient presents with left knee pain after syncopal episode. States he was walking out of the bathroom when he became dizzy and lightheaded and fell onto his left knee. States he did hit his head but did not lose consciousness. History of ongoing dizzy spells but states he's never passed out before. States the room spins and he feels lightheaded. He denies any chest pain, shortness of breath , abdominal pain, nausea or vomiting. Denies any head, neck or back pain. Is not take any blood thinners. No focal weakness, numbness or tingling. No bowel or bladder incontinence. No fever. States he's never been worked up for his dizzy episodes. He does have a history of COPD, hypertension, kidney disease. Sig apnea. States his breathing is at baseline. He no longer smokes.  Patient is a 49 y.o. male presenting with syncope and head injury. The history is provided by the patient.  Loss of Consciousness Associated symptoms: dizziness and weakness   Associated symptoms: no chest pain, no fever, no headaches, no nausea, no shortness of breath and no vomiting   Head Injury Associated symptoms: no headaches, no nausea and no vomiting     Past Medical History  Diagnosis Date  . COPD (chronic obstructive pulmonary disease)   . Cirrhosis sleep apnea  . Hypertension   . Renal disorder     stage II  . Sleep apnea   . Tobacco abuse   . Syncope   . Suicide attempt    Past Surgical History  Procedure Laterality Date  . Cholecystectomy     Family History  Problem Relation Age of Onset  . Heart disease Father   . Cancer Mother     breast   History  Substance Use Topics  . Smoking status:  Current Every Day Smoker -- 0.50 packs/day    Types: Cigarettes  . Smokeless tobacco: Not on file  . Alcohol Use: No    Review of Systems  Constitutional: Negative for fever, activity change and appetite change.  HENT: Negative for congestion and rhinorrhea.   Respiratory: Negative for cough, chest tightness and shortness of breath.   Cardiovascular: Positive for syncope. Negative for chest pain.  Gastrointestinal: Negative for nausea, vomiting and abdominal pain.  Genitourinary: Negative for dysuria, hematuria and decreased urine volume.  Musculoskeletal: Positive for myalgias and arthralgias. Negative for back pain.  Skin: Negative for rash.  Neurological: Positive for dizziness, weakness and light-headedness. Negative for headaches.  A complete 10 system review of systems was obtained and all systems are negative except as noted in the HPI and PMH.      Allergies  Penicillins  Home Medications   Prior to Admission medications   Medication Sig Start Date End Date Taking? Authorizing Provider  ARIPiprazole (ABILIFY) 30 MG tablet Take 30 mg by mouth at bedtime.  12/03/13  Yes Historical Provider, MD  ciprofloxacin (CIPRO) 750 MG tablet Take 750 mg by mouth every Wednesday.   Yes Historical Provider, MD  citalopram (CELEXA) 40 MG tablet Take 40 mg by mouth at bedtime.  12/03/13  Yes Historical Provider, MD  dextromethorphan (DELSYM) 30 MG/5ML liquid Take 30 mg by mouth every 12 (twelve) hours.   Yes Historical Provider,  MD  furosemide (LASIX) 40 MG tablet Take 40 mg by mouth daily. 12/03/13  Yes Historical Provider, MD  gabapentin (NEURONTIN) 300 MG capsule Take 300 mg by mouth 3 (three) times daily. 12/03/13  Yes Historical Provider, MD  ipratropium-albuterol (DUONEB) 0.5-2.5 (3) MG/3ML SOLN Take 3 mLs by nebulization 3 (three) times daily. 11/01/14  Yes Rosita Fire, MD  metoprolol succinate (TOPROL-XL) 50 MG 24 hr tablet Take 50 mg by mouth daily. Take with or immediately following a  meal.   Yes Historical Provider, MD  nicotine (NICODERM CQ - DOSED IN MG/24 HOURS) 21 mg/24hr patch Place 1 patch (21 mg total) onto the skin daily. 11/01/14  Yes Rosita Fire, MD  omeprazole (PRILOSEC) 20 MG capsule Take 20 mg by mouth every morning. 11/20/14  Yes Historical Provider, MD  oxyCODONE (OXY IR/ROXICODONE) 5 MG immediate release tablet Take 5 mg by mouth every 6 (six) hours as needed for severe pain.    Yes Historical Provider, MD  predniSONE (DELTASONE) 10 MG tablet Take 10 mg by mouth daily with breakfast.   Yes Historical Provider, MD  PROAIR HFA 108 (90 BASE) MCG/ACT inhaler Inhale 2 puffs into the lungs every 6 (six) hours as needed for wheezing or shortness of breath. Shortness of breath/wheeze 12/05/13  Yes Historical Provider, MD  spironolactone (ALDACTONE) 25 MG tablet Take 25 mg by mouth 2 (two) times daily. 12/03/13  Yes Historical Provider, MD  SYMBICORT 160-4.5 MCG/ACT inhaler Inhale 1 puff into the lungs 2 (two) times daily. 12/08/13  Yes Historical Provider, MD  Tiotropium Bromide Monohydrate 2.5 MCG/ACT AERS Inhale 1 capsule into the lungs daily.    Yes Historical Provider, MD  traZODone (DESYREL) 150 MG tablet Take 300 mg by mouth at bedtime.  12/03/13  Yes Historical Provider, MD  vitamin B-12 (CYANOCOBALAMIN) 500 MCG tablet Take 500 mcg by mouth daily.   Yes Historical Provider, MD  XIFAXAN 550 MG TABS tablet Take 550 mg by mouth 2 (two) times daily. 12/03/13  Yes Historical Provider, MD   BP 127/73 mmHg  Pulse 60  Temp(Src) 97.7 F (36.5 C) (Oral)  Resp 16  Ht 6\' 3"  (1.905 m)  Wt 330 lb (149.687 kg)  BMI 41.25 kg/m2  SpO2 99% Physical Exam  Constitutional: He is oriented to person, place, and time. He appears well-developed and well-nourished. No distress.  HENT:  Head: Normocephalic and atraumatic.  Mouth/Throat: Oropharynx is clear and moist. No oropharyngeal exudate.  Eyes: Conjunctivae and EOM are normal. Pupils are equal, round, and reactive to light.  Neck:  Normal range of motion. Neck supple.  No C spine tenderness  Cardiovascular: Normal rate, regular rhythm, normal heart sounds and intact distal pulses.   No murmur heard. Pulmonary/Chest: Effort normal and breath sounds normal. No respiratory distress.  Abdominal: Soft. There is no tenderness. There is no rebound and no guarding.  Musculoskeletal: Normal range of motion. He exhibits tenderness. He exhibits no edema.   Tenderness and deformity to her left proximal tibia. No patella tenderness. Compartments are soft. Intact distal pulses.   Neurological: He is alert and oriented to person, place, and time. No cranial nerve deficit. He exhibits normal muscle tone. Coordination normal.  No ataxia on finger to nose bilaterally. No pronator drift. 5/5 strength throughout. CN 2-12 intact. Equal grip strength. Sensation intact.   Skin: Skin is warm.  Psychiatric: He has a normal mood and affect. His behavior is normal.  Nursing note and vitals reviewed.   ED Course  Procedures (including  critical care time) Labs Review Labs Reviewed  CBC WITH DIFFERENTIAL/PLATELET - Abnormal; Notable for the following:    RBC 4.00 (*)    MCV 102.3 (*)    Platelets 99 (*)    Neutrophils Relative % 85 (*)    Neutro Abs 8.6 (*)    Lymphocytes Relative 9 (*)    All other components within normal limits  BASIC METABOLIC PANEL - Abnormal; Notable for the following:    Chloride 99 (*)    Glucose, Bld 121 (*)    BUN 39 (*)    Creatinine, Ser 1.79 (*)    GFR calc non Af Amer 43 (*)    GFR calc Af Amer 49 (*)    All other components within normal limits  TROPONIN I    Imaging Review Dg Chest 2 View  12/07/2014   CLINICAL DATA:  Fall with lower leg pain.  EXAM: CHEST  2 VIEW  COMPARISON:  10/27/2014 as well as 10/03/2014 and 08/29/2014  FINDINGS: Patient slightly rotated to the left. Lungs are adequately inflated without focal consolidation or effusion. There is stable cardiomegaly. Remainder of the exam is  unchanged.  IMPRESSION: No active cardiopulmonary disease.  Stable cardiomegaly.   Electronically Signed   By: Marin Olp M.D.   On: 12/07/2014 18:13   Dg Tibia/fibula Left  12/07/2014   CLINICAL DATA:  Fall.  Pain in lower leg.  EXAM: LEFT TIBIA AND FIBULA - 2 VIEW  COMPARISON:  None.  FINDINGS: There is a comminuted fracture of the proximal tibia, which extends from the level of the anterior tibial spines inferiorly, involving both medial lateral aspects. There is a small defect along the lateral femoral condyle. There is a fracture involving the proximal aspect of the fibula, minimally displaced. A nondisplaced probable fracture is noted the distal fibula. Distal tibia appears normal.  IMPRESSION: 1. Comminuted fracture of the proximal tibia involving proximal joint. 2. Osteochondral fragment of the lateral condyle of the femur. 3. Proximal and distal fractures of the fibula.   Electronically Signed   By: Nolon Nations M.D.   On: 12/07/2014 18:14   Dg Knee Complete 4 Views Left  12/07/2014   CLINICAL DATA:  Pain in lower leg after fall  EXAM: LEFT KNEE - COMPLETE 4+ VIEW  COMPARISON:  None.  FINDINGS: There is a comminuted minimally displaced proximal tibial metaphyseal fracture which extends longitudinally up to the tibial spines and probably also reaches the articular surface of the lateral plateau without step-off. There is a transverse minimally displaced proximal fibular fracture. There is fragmentation of the lateral femoral condyle which may also represent an acute fracture, mildly depressed. No bone lesion or bony destruction is evident to suggest a pathologic basis for the fracture. No acute soft tissue abnormality is evident.  IMPRESSION: Comminuted minimally displaced proximal tibial fracture which extends into the tibial spines and may also reach the lateral plateau articular surface. Minimally displaced proximal fibular fracture. Fragmented lateral femoral condyle, possibly also an acute  fracture.   Electronically Signed   By: Andreas Newport M.D.   On: 12/07/2014 18:12     EKG Interpretation   Date/Time:  Monday December 07 2014 17:22:02 EDT Ventricular Rate:  61 PR Interval:  189 QRS Duration: 95 QT Interval:  437 QTC Calculation: 440 R Axis:   51 Text Interpretation:  Sinus rhythm Low voltage, precordial leads Probable  anteroseptal infarct, old Abnormal ekg since last tracing no significant  change Confirmed by Sabra Heck  MD, BRIAN (  [id number]) on 12/07/2014 5:43:41 PM      MDM   Final diagnoses:  Fracture of proximal end of tibia, left, closed, initial encounter  Femoral condyle fracture, left, closed, initial encounter    patient with syncopal episode onto left knee. Recurrent episodes of dizzy spells. Hit head on the floor but did not lose consciousness. No blood thinner use. Denies any headache or neck pain.   Left knee has tenderness and deformity to the proximal tibia. Distal pulses are intact. No open wounds.   X-ray confirms proximal tibia fracture with displacement.  Compartments are becoming more firm. Discussed with Dr. Luna Glasgow who will evaluate for compartment syndrome.   patient seen by Dr. Luna Glasgow who does not feel he has compartment syndrome at this time. Recommends adding ice.  He feels patient needs to be transferred to [facility] for evaluation of his injury by orthopedics.   discussed with Dr. Berenice Primas. He states there is no trauma orthopedist available. He recommends transfer to [facility]  D/w Dr. Rayna Sexton at [facility] ED. She accepts patient for transfer.  She will have ortho evaluation and arrange for medical admission for syncope.  D/w Dr. Rayna Sexton who accepts patient to [facility] ED for ortho evaluation.  Ezequiel Essex, MD 12/07/14 [phone]

## 2014-12-08 DIAGNOSIS — I517 Cardiomegaly: Secondary | ICD-10-CM | POA: Diagnosis not present

## 2014-12-08 DIAGNOSIS — E1140 Type 2 diabetes mellitus with diabetic neuropathy, unspecified: Secondary | ICD-10-CM | POA: Diagnosis not present

## 2014-12-08 DIAGNOSIS — R9431 Abnormal electrocardiogram [ECG] [EKG]: Secondary | ICD-10-CM | POA: Diagnosis not present

## 2014-12-08 DIAGNOSIS — S82252A Displaced comminuted fracture of shaft of left tibia, initial encounter for closed fracture: Secondary | ICD-10-CM | POA: Diagnosis not present

## 2014-12-08 DIAGNOSIS — F329 Major depressive disorder, single episode, unspecified: Secondary | ICD-10-CM | POA: Diagnosis not present

## 2014-12-08 DIAGNOSIS — J9601 Acute respiratory failure with hypoxia: Secondary | ICD-10-CM | POA: Diagnosis not present

## 2014-12-08 DIAGNOSIS — R000 Tachycardia, unspecified: Secondary | ICD-10-CM | POA: Diagnosis not present

## 2014-12-08 DIAGNOSIS — S82842A Displaced bimalleolar fracture of left lower leg, initial encounter for closed fracture: Secondary | ICD-10-CM | POA: Diagnosis not present

## 2014-12-08 DIAGNOSIS — T148 Other injury of unspecified body region: Secondary | ICD-10-CM | POA: Diagnosis not present

## 2014-12-08 DIAGNOSIS — T79A22A Traumatic compartment syndrome of left lower extremity, initial encounter: Secondary | ICD-10-CM | POA: Diagnosis not present

## 2014-12-08 DIAGNOSIS — R55 Syncope and collapse: Secondary | ICD-10-CM | POA: Diagnosis not present

## 2014-12-08 DIAGNOSIS — N183 Chronic kidney disease, stage 3 (moderate): Secondary | ICD-10-CM | POA: Diagnosis not present

## 2014-12-08 DIAGNOSIS — S82142A Displaced bicondylar fracture of left tibia, initial encounter for closed fracture: Secondary | ICD-10-CM | POA: Diagnosis not present

## 2014-12-08 DIAGNOSIS — S82832A Other fracture of upper and lower end of left fibula, initial encounter for closed fracture: Secondary | ICD-10-CM | POA: Diagnosis not present

## 2014-12-08 DIAGNOSIS — M25062 Hemarthrosis, left knee: Secondary | ICD-10-CM | POA: Diagnosis not present

## 2014-12-08 DIAGNOSIS — K7030 Alcoholic cirrhosis of liver without ascites: Secondary | ICD-10-CM | POA: Diagnosis not present

## 2014-12-08 DIAGNOSIS — G4733 Obstructive sleep apnea (adult) (pediatric): Secondary | ICD-10-CM | POA: Diagnosis not present

## 2014-12-08 DIAGNOSIS — S82102A Unspecified fracture of upper end of left tibia, initial encounter for closed fracture: Secondary | ICD-10-CM | POA: Diagnosis not present

## 2014-12-08 DIAGNOSIS — J449 Chronic obstructive pulmonary disease, unspecified: Secondary | ICD-10-CM | POA: Diagnosis not present

## 2014-12-08 DIAGNOSIS — S82192A Other fracture of upper end of left tibia, initial encounter for closed fracture: Secondary | ICD-10-CM | POA: Diagnosis not present

## 2014-12-09 DIAGNOSIS — K7030 Alcoholic cirrhosis of liver without ascites: Secondary | ICD-10-CM | POA: Diagnosis not present

## 2014-12-09 DIAGNOSIS — S82142A Displaced bicondylar fracture of left tibia, initial encounter for closed fracture: Secondary | ICD-10-CM | POA: Diagnosis not present

## 2014-12-09 DIAGNOSIS — J9601 Acute respiratory failure with hypoxia: Secondary | ICD-10-CM | POA: Diagnosis not present

## 2014-12-09 DIAGNOSIS — T79A22D Traumatic compartment syndrome of left lower extremity, subsequent encounter: Secondary | ICD-10-CM | POA: Diagnosis not present

## 2014-12-09 DIAGNOSIS — G4733 Obstructive sleep apnea (adult) (pediatric): Secondary | ICD-10-CM | POA: Diagnosis not present

## 2014-12-09 DIAGNOSIS — Z481 Encounter for planned postprocedural wound closure: Secondary | ICD-10-CM | POA: Diagnosis not present

## 2014-12-09 DIAGNOSIS — J449 Chronic obstructive pulmonary disease, unspecified: Secondary | ICD-10-CM | POA: Diagnosis not present

## 2014-12-09 DIAGNOSIS — R55 Syncope and collapse: Secondary | ICD-10-CM | POA: Diagnosis not present

## 2014-12-09 DIAGNOSIS — S82842A Displaced bimalleolar fracture of left lower leg, initial encounter for closed fracture: Secondary | ICD-10-CM | POA: Diagnosis not present

## 2014-12-09 DIAGNOSIS — S81802A Unspecified open wound, left lower leg, initial encounter: Secondary | ICD-10-CM | POA: Diagnosis not present

## 2014-12-09 DIAGNOSIS — N183 Chronic kidney disease, stage 3 (moderate): Secondary | ICD-10-CM | POA: Diagnosis not present

## 2014-12-10 DIAGNOSIS — N183 Chronic kidney disease, stage 3 (moderate): Secondary | ICD-10-CM | POA: Diagnosis not present

## 2014-12-10 DIAGNOSIS — S82142A Displaced bicondylar fracture of left tibia, initial encounter for closed fracture: Secondary | ICD-10-CM | POA: Diagnosis not present

## 2014-12-10 DIAGNOSIS — J9601 Acute respiratory failure with hypoxia: Secondary | ICD-10-CM | POA: Diagnosis not present

## 2014-12-10 DIAGNOSIS — K7030 Alcoholic cirrhosis of liver without ascites: Secondary | ICD-10-CM | POA: Diagnosis not present

## 2014-12-10 DIAGNOSIS — T79A22D Traumatic compartment syndrome of left lower extremity, subsequent encounter: Secondary | ICD-10-CM | POA: Diagnosis not present

## 2014-12-10 DIAGNOSIS — R55 Syncope and collapse: Secondary | ICD-10-CM | POA: Diagnosis not present

## 2014-12-10 DIAGNOSIS — G4733 Obstructive sleep apnea (adult) (pediatric): Secondary | ICD-10-CM | POA: Diagnosis not present

## 2014-12-10 DIAGNOSIS — J449 Chronic obstructive pulmonary disease, unspecified: Secondary | ICD-10-CM | POA: Diagnosis not present

## 2014-12-11 DIAGNOSIS — D62 Acute posthemorrhagic anemia: Secondary | ICD-10-CM | POA: Diagnosis not present

## 2014-12-11 DIAGNOSIS — N183 Chronic kidney disease, stage 3 (moderate): Secondary | ICD-10-CM | POA: Diagnosis not present

## 2014-12-11 DIAGNOSIS — S81802A Unspecified open wound, left lower leg, initial encounter: Secondary | ICD-10-CM | POA: Diagnosis not present

## 2014-12-11 DIAGNOSIS — R55 Syncope and collapse: Secondary | ICD-10-CM | POA: Diagnosis not present

## 2014-12-11 DIAGNOSIS — J9601 Acute respiratory failure with hypoxia: Secondary | ICD-10-CM | POA: Diagnosis not present

## 2014-12-11 DIAGNOSIS — S82142A Displaced bicondylar fracture of left tibia, initial encounter for closed fracture: Secondary | ICD-10-CM | POA: Diagnosis not present

## 2014-12-11 DIAGNOSIS — K7030 Alcoholic cirrhosis of liver without ascites: Secondary | ICD-10-CM | POA: Diagnosis not present

## 2014-12-12 DIAGNOSIS — D62 Acute posthemorrhagic anemia: Secondary | ICD-10-CM | POA: Diagnosis not present

## 2014-12-12 DIAGNOSIS — N183 Chronic kidney disease, stage 3 (moderate): Secondary | ICD-10-CM | POA: Diagnosis not present

## 2014-12-12 DIAGNOSIS — S82142A Displaced bicondylar fracture of left tibia, initial encounter for closed fracture: Secondary | ICD-10-CM | POA: Diagnosis not present

## 2014-12-12 DIAGNOSIS — J9601 Acute respiratory failure with hypoxia: Secondary | ICD-10-CM | POA: Diagnosis not present

## 2014-12-12 DIAGNOSIS — K7030 Alcoholic cirrhosis of liver without ascites: Secondary | ICD-10-CM | POA: Diagnosis not present

## 2014-12-12 DIAGNOSIS — R55 Syncope and collapse: Secondary | ICD-10-CM | POA: Diagnosis not present

## 2014-12-13 DIAGNOSIS — N183 Chronic kidney disease, stage 3 (moderate): Secondary | ICD-10-CM | POA: Diagnosis not present

## 2014-12-13 DIAGNOSIS — S82142A Displaced bicondylar fracture of left tibia, initial encounter for closed fracture: Secondary | ICD-10-CM | POA: Diagnosis not present

## 2014-12-13 DIAGNOSIS — R55 Syncope and collapse: Secondary | ICD-10-CM | POA: Diagnosis not present

## 2014-12-13 DIAGNOSIS — R0602 Shortness of breath: Secondary | ICD-10-CM | POA: Diagnosis not present

## 2014-12-13 DIAGNOSIS — K7030 Alcoholic cirrhosis of liver without ascites: Secondary | ICD-10-CM | POA: Diagnosis not present

## 2014-12-13 DIAGNOSIS — J9601 Acute respiratory failure with hypoxia: Secondary | ICD-10-CM | POA: Diagnosis not present

## 2014-12-13 DIAGNOSIS — D62 Acute posthemorrhagic anemia: Secondary | ICD-10-CM | POA: Diagnosis not present

## 2014-12-14 DIAGNOSIS — R0602 Shortness of breath: Secondary | ICD-10-CM | POA: Diagnosis not present

## 2014-12-14 DIAGNOSIS — N183 Chronic kidney disease, stage 3 (moderate): Secondary | ICD-10-CM | POA: Diagnosis not present

## 2014-12-14 DIAGNOSIS — K7030 Alcoholic cirrhosis of liver without ascites: Secondary | ICD-10-CM | POA: Diagnosis not present

## 2014-12-14 DIAGNOSIS — R799 Abnormal finding of blood chemistry, unspecified: Secondary | ICD-10-CM | POA: Diagnosis not present

## 2014-12-14 DIAGNOSIS — D62 Acute posthemorrhagic anemia: Secondary | ICD-10-CM | POA: Diagnosis not present

## 2014-12-14 DIAGNOSIS — J9601 Acute respiratory failure with hypoxia: Secondary | ICD-10-CM | POA: Diagnosis not present

## 2014-12-14 DIAGNOSIS — R55 Syncope and collapse: Secondary | ICD-10-CM | POA: Diagnosis not present

## 2014-12-14 DIAGNOSIS — S82142A Displaced bicondylar fracture of left tibia, initial encounter for closed fracture: Secondary | ICD-10-CM | POA: Diagnosis not present

## 2014-12-15 DIAGNOSIS — R55 Syncope and collapse: Secondary | ICD-10-CM | POA: Diagnosis not present

## 2014-12-15 DIAGNOSIS — N183 Chronic kidney disease, stage 3 (moderate): Secondary | ICD-10-CM | POA: Diagnosis not present

## 2014-12-15 DIAGNOSIS — J9621 Acute and chronic respiratory failure with hypoxia: Secondary | ICD-10-CM | POA: Diagnosis not present

## 2014-12-15 DIAGNOSIS — K567 Ileus, unspecified: Secondary | ICD-10-CM | POA: Diagnosis not present

## 2014-12-15 DIAGNOSIS — K7030 Alcoholic cirrhosis of liver without ascites: Secondary | ICD-10-CM | POA: Diagnosis not present

## 2014-12-15 DIAGNOSIS — N179 Acute kidney failure, unspecified: Secondary | ICD-10-CM | POA: Diagnosis not present

## 2014-12-15 DIAGNOSIS — J9602 Acute respiratory failure with hypercapnia: Secondary | ICD-10-CM | POA: Diagnosis not present

## 2014-12-15 DIAGNOSIS — J9601 Acute respiratory failure with hypoxia: Secondary | ICD-10-CM | POA: Diagnosis not present

## 2014-12-16 DIAGNOSIS — N179 Acute kidney failure, unspecified: Secondary | ICD-10-CM | POA: Diagnosis not present

## 2014-12-16 DIAGNOSIS — K7030 Alcoholic cirrhosis of liver without ascites: Secondary | ICD-10-CM | POA: Diagnosis not present

## 2014-12-16 DIAGNOSIS — N183 Chronic kidney disease, stage 3 (moderate): Secondary | ICD-10-CM | POA: Diagnosis not present

## 2014-12-16 DIAGNOSIS — K567 Ileus, unspecified: Secondary | ICD-10-CM | POA: Diagnosis not present

## 2014-12-16 DIAGNOSIS — J9602 Acute respiratory failure with hypercapnia: Secondary | ICD-10-CM | POA: Diagnosis not present

## 2014-12-16 DIAGNOSIS — J9601 Acute respiratory failure with hypoxia: Secondary | ICD-10-CM | POA: Diagnosis not present

## 2014-12-16 DIAGNOSIS — J9621 Acute and chronic respiratory failure with hypoxia: Secondary | ICD-10-CM | POA: Diagnosis not present

## 2014-12-16 DIAGNOSIS — R55 Syncope and collapse: Secondary | ICD-10-CM | POA: Diagnosis not present

## 2014-12-17 DIAGNOSIS — F319 Bipolar disorder, unspecified: Secondary | ICD-10-CM | POA: Diagnosis present

## 2014-12-17 DIAGNOSIS — I517 Cardiomegaly: Secondary | ICD-10-CM | POA: Diagnosis not present

## 2014-12-17 DIAGNOSIS — R0902 Hypoxemia: Secondary | ICD-10-CM | POA: Diagnosis not present

## 2014-12-17 DIAGNOSIS — L7682 Other postprocedural complications of skin and subcutaneous tissue: Secondary | ICD-10-CM | POA: Diagnosis not present

## 2014-12-17 DIAGNOSIS — M6281 Muscle weakness (generalized): Secondary | ICD-10-CM | POA: Diagnosis not present

## 2014-12-17 DIAGNOSIS — S82142A Displaced bicondylar fracture of left tibia, initial encounter for closed fracture: Secondary | ICD-10-CM | POA: Diagnosis not present

## 2014-12-17 DIAGNOSIS — F339 Major depressive disorder, recurrent, unspecified: Secondary | ICD-10-CM | POA: Diagnosis not present

## 2014-12-17 DIAGNOSIS — R718 Other abnormality of red blood cells: Secondary | ICD-10-CM | POA: Diagnosis not present

## 2014-12-17 DIAGNOSIS — F17210 Nicotine dependence, cigarettes, uncomplicated: Secondary | ICD-10-CM | POA: Diagnosis present

## 2014-12-17 DIAGNOSIS — J449 Chronic obstructive pulmonary disease, unspecified: Secondary | ICD-10-CM | POA: Diagnosis present

## 2014-12-17 DIAGNOSIS — I10 Essential (primary) hypertension: Secondary | ICD-10-CM | POA: Diagnosis not present

## 2014-12-17 DIAGNOSIS — K5900 Constipation, unspecified: Secondary | ICD-10-CM | POA: Diagnosis not present

## 2014-12-17 DIAGNOSIS — J9611 Chronic respiratory failure with hypoxia: Secondary | ICD-10-CM | POA: Diagnosis not present

## 2014-12-17 DIAGNOSIS — R001 Bradycardia, unspecified: Secondary | ICD-10-CM | POA: Diagnosis not present

## 2014-12-17 DIAGNOSIS — Z7409 Other reduced mobility: Secondary | ICD-10-CM | POA: Diagnosis not present

## 2014-12-17 DIAGNOSIS — S82142D Displaced bicondylar fracture of left tibia, subsequent encounter for closed fracture with routine healing: Secondary | ICD-10-CM | POA: Diagnosis not present

## 2014-12-17 DIAGNOSIS — G8929 Other chronic pain: Secondary | ICD-10-CM | POA: Diagnosis present

## 2014-12-17 DIAGNOSIS — N390 Urinary tract infection, site not specified: Secondary | ICD-10-CM | POA: Diagnosis present

## 2014-12-17 DIAGNOSIS — I129 Hypertensive chronic kidney disease with stage 1 through stage 4 chronic kidney disease, or unspecified chronic kidney disease: Secondary | ICD-10-CM | POA: Diagnosis present

## 2014-12-17 DIAGNOSIS — R768 Other specified abnormal immunological findings in serum: Secondary | ICD-10-CM | POA: Diagnosis not present

## 2014-12-17 DIAGNOSIS — N189 Chronic kidney disease, unspecified: Secondary | ICD-10-CM | POA: Diagnosis not present

## 2014-12-17 DIAGNOSIS — R0682 Tachypnea, not elsewhere classified: Secondary | ICD-10-CM | POA: Diagnosis not present

## 2014-12-17 DIAGNOSIS — E46 Unspecified protein-calorie malnutrition: Secondary | ICD-10-CM | POA: Diagnosis not present

## 2014-12-17 DIAGNOSIS — K9189 Other postprocedural complications and disorders of digestive system: Secondary | ICD-10-CM | POA: Diagnosis not present

## 2014-12-17 DIAGNOSIS — Z8249 Family history of ischemic heart disease and other diseases of the circulatory system: Secondary | ICD-10-CM | POA: Diagnosis not present

## 2014-12-17 DIAGNOSIS — S82202D Unspecified fracture of shaft of left tibia, subsequent encounter for closed fracture with routine healing: Secondary | ICD-10-CM | POA: Diagnosis not present

## 2014-12-17 DIAGNOSIS — D649 Anemia, unspecified: Secondary | ICD-10-CM | POA: Diagnosis not present

## 2014-12-17 DIAGNOSIS — G4733 Obstructive sleep apnea (adult) (pediatric): Secondary | ICD-10-CM | POA: Diagnosis not present

## 2014-12-17 DIAGNOSIS — L8990 Pressure ulcer of unspecified site, unspecified stage: Secondary | ICD-10-CM | POA: Diagnosis present

## 2014-12-17 DIAGNOSIS — K7460 Unspecified cirrhosis of liver: Secondary | ICD-10-CM | POA: Diagnosis not present

## 2014-12-17 DIAGNOSIS — R55 Syncope and collapse: Secondary | ICD-10-CM | POA: Diagnosis not present

## 2014-12-17 DIAGNOSIS — R509 Fever, unspecified: Secondary | ICD-10-CM | POA: Diagnosis not present

## 2014-12-17 DIAGNOSIS — D696 Thrombocytopenia, unspecified: Secondary | ICD-10-CM | POA: Diagnosis not present

## 2014-12-17 DIAGNOSIS — K219 Gastro-esophageal reflux disease without esophagitis: Secondary | ICD-10-CM | POA: Diagnosis not present

## 2014-12-17 DIAGNOSIS — G4700 Insomnia, unspecified: Secondary | ICD-10-CM | POA: Diagnosis not present

## 2014-12-17 DIAGNOSIS — N183 Chronic kidney disease, stage 3 (moderate): Secondary | ICD-10-CM | POA: Diagnosis not present

## 2014-12-17 DIAGNOSIS — D62 Acute posthemorrhagic anemia: Secondary | ICD-10-CM | POA: Diagnosis not present

## 2014-12-17 DIAGNOSIS — Z6841 Body Mass Index (BMI) 40.0 and over, adult: Secondary | ICD-10-CM | POA: Diagnosis not present

## 2014-12-17 DIAGNOSIS — R5381 Other malaise: Secondary | ICD-10-CM | POA: Diagnosis not present

## 2014-12-17 DIAGNOSIS — R2681 Unsteadiness on feet: Secondary | ICD-10-CM | POA: Diagnosis not present

## 2014-12-17 DIAGNOSIS — K7030 Alcoholic cirrhosis of liver without ascites: Secondary | ICD-10-CM | POA: Diagnosis not present

## 2014-12-17 DIAGNOSIS — S82832D Other fracture of upper and lower end of left fibula, subsequent encounter for closed fracture with routine healing: Secondary | ICD-10-CM | POA: Diagnosis not present

## 2014-12-17 DIAGNOSIS — R262 Difficulty in walking, not elsewhere classified: Secondary | ICD-10-CM | POA: Diagnosis not present

## 2014-12-17 DIAGNOSIS — R531 Weakness: Secondary | ICD-10-CM | POA: Diagnosis present

## 2014-12-17 DIAGNOSIS — T79A22S Traumatic compartment syndrome of left lower extremity, sequela: Secondary | ICD-10-CM | POA: Diagnosis not present

## 2014-12-17 DIAGNOSIS — E119 Type 2 diabetes mellitus without complications: Secondary | ICD-10-CM | POA: Diagnosis not present

## 2014-12-17 DIAGNOSIS — J811 Chronic pulmonary edema: Secondary | ICD-10-CM | POA: Diagnosis not present

## 2014-12-17 DIAGNOSIS — R600 Localized edema: Secondary | ICD-10-CM | POA: Diagnosis not present

## 2014-12-18 DIAGNOSIS — J449 Chronic obstructive pulmonary disease, unspecified: Secondary | ICD-10-CM | POA: Diagnosis not present

## 2014-12-18 DIAGNOSIS — E46 Unspecified protein-calorie malnutrition: Secondary | ICD-10-CM | POA: Diagnosis not present

## 2014-12-18 DIAGNOSIS — G4733 Obstructive sleep apnea (adult) (pediatric): Secondary | ICD-10-CM | POA: Diagnosis not present

## 2014-12-18 DIAGNOSIS — J9611 Chronic respiratory failure with hypoxia: Secondary | ICD-10-CM | POA: Diagnosis not present

## 2014-12-18 DIAGNOSIS — D62 Acute posthemorrhagic anemia: Secondary | ICD-10-CM | POA: Diagnosis not present

## 2014-12-18 DIAGNOSIS — K5900 Constipation, unspecified: Secondary | ICD-10-CM | POA: Diagnosis not present

## 2014-12-18 DIAGNOSIS — R001 Bradycardia, unspecified: Secondary | ICD-10-CM | POA: Diagnosis not present

## 2014-12-18 DIAGNOSIS — K7030 Alcoholic cirrhosis of liver without ascites: Secondary | ICD-10-CM | POA: Diagnosis not present

## 2014-12-19 DIAGNOSIS — R001 Bradycardia, unspecified: Secondary | ICD-10-CM | POA: Diagnosis not present

## 2014-12-19 DIAGNOSIS — E46 Unspecified protein-calorie malnutrition: Secondary | ICD-10-CM | POA: Diagnosis not present

## 2014-12-19 DIAGNOSIS — K7030 Alcoholic cirrhosis of liver without ascites: Secondary | ICD-10-CM | POA: Diagnosis not present

## 2014-12-19 DIAGNOSIS — G4733 Obstructive sleep apnea (adult) (pediatric): Secondary | ICD-10-CM | POA: Diagnosis not present

## 2014-12-19 DIAGNOSIS — K5900 Constipation, unspecified: Secondary | ICD-10-CM | POA: Diagnosis not present

## 2014-12-19 DIAGNOSIS — J449 Chronic obstructive pulmonary disease, unspecified: Secondary | ICD-10-CM | POA: Diagnosis not present

## 2014-12-19 DIAGNOSIS — J9611 Chronic respiratory failure with hypoxia: Secondary | ICD-10-CM | POA: Diagnosis not present

## 2014-12-19 DIAGNOSIS — D62 Acute posthemorrhagic anemia: Secondary | ICD-10-CM | POA: Diagnosis not present

## 2014-12-20 DIAGNOSIS — E46 Unspecified protein-calorie malnutrition: Secondary | ICD-10-CM | POA: Diagnosis not present

## 2014-12-20 DIAGNOSIS — J449 Chronic obstructive pulmonary disease, unspecified: Secondary | ICD-10-CM | POA: Diagnosis not present

## 2014-12-20 DIAGNOSIS — K5900 Constipation, unspecified: Secondary | ICD-10-CM | POA: Diagnosis not present

## 2014-12-20 DIAGNOSIS — R001 Bradycardia, unspecified: Secondary | ICD-10-CM | POA: Diagnosis not present

## 2014-12-20 DIAGNOSIS — K7030 Alcoholic cirrhosis of liver without ascites: Secondary | ICD-10-CM | POA: Diagnosis not present

## 2014-12-20 DIAGNOSIS — J9611 Chronic respiratory failure with hypoxia: Secondary | ICD-10-CM | POA: Diagnosis not present

## 2014-12-20 DIAGNOSIS — D62 Acute posthemorrhagic anemia: Secondary | ICD-10-CM | POA: Diagnosis not present

## 2014-12-20 DIAGNOSIS — G4733 Obstructive sleep apnea (adult) (pediatric): Secondary | ICD-10-CM | POA: Diagnosis not present

## 2014-12-21 ENCOUNTER — Emergency Department (HOSPITAL_COMMUNITY): Payer: Medicare Other

## 2014-12-21 ENCOUNTER — Encounter (HOSPITAL_COMMUNITY): Payer: Self-pay | Admitting: *Deleted

## 2014-12-21 ENCOUNTER — Inpatient Hospital Stay (HOSPITAL_COMMUNITY)
Admission: EM | Admit: 2014-12-21 | Discharge: 2014-12-23 | DRG: 812 | Disposition: A | Discharge status: Skilled Nursing Facility | Payer: Medicare Other | Attending: Internal Medicine | Admitting: Internal Medicine

## 2014-12-21 ENCOUNTER — Ambulatory Visit (HOSPITAL_COMMUNITY)
Admission: RE | Admit: 2014-12-21 | Discharge: 2014-12-21 | Disposition: A | Discharge status: Home / Self Care | Payer: Medicare Other | Source: Ambulatory Visit | Attending: Internal Medicine | Admitting: Internal Medicine

## 2014-12-21 DIAGNOSIS — N183 Chronic kidney disease, stage 3 unspecified: Secondary | ICD-10-CM | POA: Diagnosis present

## 2014-12-21 DIAGNOSIS — I10 Essential (primary) hypertension: Secondary | ICD-10-CM | POA: Diagnosis not present

## 2014-12-21 DIAGNOSIS — J449 Chronic obstructive pulmonary disease, unspecified: Secondary | ICD-10-CM | POA: Diagnosis present

## 2014-12-21 DIAGNOSIS — Z6841 Body Mass Index (BMI) 40.0 and over, adult: Secondary | ICD-10-CM

## 2014-12-21 DIAGNOSIS — T79A22S Traumatic compartment syndrome of left lower extremity, sequela: Secondary | ICD-10-CM | POA: Diagnosis not present

## 2014-12-21 DIAGNOSIS — F17210 Nicotine dependence, cigarettes, uncomplicated: Secondary | ICD-10-CM | POA: Diagnosis present

## 2014-12-21 DIAGNOSIS — L8990 Pressure ulcer of unspecified site, unspecified stage: Secondary | ICD-10-CM | POA: Diagnosis present

## 2014-12-21 DIAGNOSIS — F319 Bipolar disorder, unspecified: Secondary | ICD-10-CM | POA: Diagnosis present

## 2014-12-21 DIAGNOSIS — I129 Hypertensive chronic kidney disease with stage 1 through stage 4 chronic kidney disease, or unspecified chronic kidney disease: Secondary | ICD-10-CM | POA: Diagnosis present

## 2014-12-21 DIAGNOSIS — J811 Chronic pulmonary edema: Secondary | ICD-10-CM | POA: Diagnosis not present

## 2014-12-21 DIAGNOSIS — M6281 Muscle weakness (generalized): Secondary | ICD-10-CM | POA: Diagnosis not present

## 2014-12-21 DIAGNOSIS — R0682 Tachypnea, not elsewhere classified: Secondary | ICD-10-CM | POA: Diagnosis not present

## 2014-12-21 DIAGNOSIS — R0902 Hypoxemia: Secondary | ICD-10-CM | POA: Diagnosis not present

## 2014-12-21 DIAGNOSIS — G8929 Other chronic pain: Secondary | ICD-10-CM | POA: Diagnosis present

## 2014-12-21 DIAGNOSIS — R262 Difficulty in walking, not elsewhere classified: Secondary | ICD-10-CM | POA: Diagnosis not present

## 2014-12-21 DIAGNOSIS — R279 Unspecified lack of coordination: Secondary | ICD-10-CM | POA: Diagnosis not present

## 2014-12-21 DIAGNOSIS — K7460 Unspecified cirrhosis of liver: Secondary | ICD-10-CM | POA: Diagnosis not present

## 2014-12-21 DIAGNOSIS — D539 Nutritional anemia, unspecified: Secondary | ICD-10-CM | POA: Diagnosis present

## 2014-12-21 DIAGNOSIS — R509 Fever, unspecified: Secondary | ICD-10-CM | POA: Diagnosis not present

## 2014-12-21 DIAGNOSIS — D500 Iron deficiency anemia secondary to blood loss (chronic): Secondary | ICD-10-CM | POA: Diagnosis not present

## 2014-12-21 DIAGNOSIS — N189 Chronic kidney disease, unspecified: Secondary | ICD-10-CM | POA: Diagnosis not present

## 2014-12-21 DIAGNOSIS — R55 Syncope and collapse: Secondary | ICD-10-CM | POA: Diagnosis not present

## 2014-12-21 DIAGNOSIS — D696 Thrombocytopenia, unspecified: Secondary | ICD-10-CM | POA: Diagnosis not present

## 2014-12-21 DIAGNOSIS — R2681 Unsteadiness on feet: Secondary | ICD-10-CM | POA: Diagnosis not present

## 2014-12-21 DIAGNOSIS — L7682 Other postprocedural complications of skin and subcutaneous tissue: Secondary | ICD-10-CM | POA: Diagnosis not present

## 2014-12-21 DIAGNOSIS — K219 Gastro-esophageal reflux disease without esophagitis: Secondary | ICD-10-CM | POA: Diagnosis not present

## 2014-12-21 DIAGNOSIS — R5381 Other malaise: Secondary | ICD-10-CM | POA: Diagnosis not present

## 2014-12-21 DIAGNOSIS — G4700 Insomnia, unspecified: Secondary | ICD-10-CM | POA: Diagnosis not present

## 2014-12-21 DIAGNOSIS — J9611 Chronic respiratory failure with hypoxia: Secondary | ICD-10-CM | POA: Diagnosis not present

## 2014-12-21 DIAGNOSIS — N390 Urinary tract infection, site not specified: Secondary | ICD-10-CM | POA: Diagnosis present

## 2014-12-21 DIAGNOSIS — E119 Type 2 diabetes mellitus without complications: Secondary | ICD-10-CM | POA: Diagnosis not present

## 2014-12-21 DIAGNOSIS — S82202D Unspecified fracture of shaft of left tibia, subsequent encounter for closed fracture with routine healing: Secondary | ICD-10-CM | POA: Diagnosis not present

## 2014-12-21 DIAGNOSIS — Z8249 Family history of ischemic heart disease and other diseases of the circulatory system: Secondary | ICD-10-CM | POA: Diagnosis not present

## 2014-12-21 DIAGNOSIS — G4733 Obstructive sleep apnea (adult) (pediatric): Secondary | ICD-10-CM | POA: Diagnosis not present

## 2014-12-21 DIAGNOSIS — D649 Anemia, unspecified: Principal | ICD-10-CM | POA: Diagnosis present

## 2014-12-21 DIAGNOSIS — Z743 Need for continuous supervision: Secondary | ICD-10-CM | POA: Diagnosis not present

## 2014-12-21 DIAGNOSIS — S82142D Displaced bicondylar fracture of left tibia, subsequent encounter for closed fracture with routine healing: Secondary | ICD-10-CM | POA: Diagnosis not present

## 2014-12-21 DIAGNOSIS — K9189 Other postprocedural complications and disorders of digestive system: Secondary | ICD-10-CM | POA: Diagnosis not present

## 2014-12-21 DIAGNOSIS — R718 Other abnormality of red blood cells: Secondary | ICD-10-CM | POA: Diagnosis not present

## 2014-12-21 DIAGNOSIS — F339 Major depressive disorder, recurrent, unspecified: Secondary | ICD-10-CM | POA: Diagnosis not present

## 2014-12-21 DIAGNOSIS — D62 Acute posthemorrhagic anemia: Secondary | ICD-10-CM | POA: Diagnosis not present

## 2014-12-21 DIAGNOSIS — R531 Weakness: Secondary | ICD-10-CM | POA: Diagnosis not present

## 2014-12-21 DIAGNOSIS — R600 Localized edema: Secondary | ICD-10-CM | POA: Diagnosis not present

## 2014-12-21 DIAGNOSIS — I517 Cardiomegaly: Secondary | ICD-10-CM | POA: Diagnosis not present

## 2014-12-21 DIAGNOSIS — S82832D Other fracture of upper and lower end of left fibula, subsequent encounter for closed fracture with routine healing: Secondary | ICD-10-CM | POA: Diagnosis not present

## 2014-12-21 LAB — COMPREHENSIVE METABOLIC PANEL
ALK PHOS: 84 U/L (ref 38–126)
ALT: 32 U/L (ref 17–63)
AST: 37 U/L (ref 15–41)
Albumin: 2.4 g/dL — ABNORMAL LOW (ref 3.5–5.0)
Anion gap: 9 (ref 5–15)
BUN: 27 mg/dL — AB (ref 6–20)
CALCIUM: 7.4 mg/dL — AB (ref 8.9–10.3)
CO2: 31 mmol/L (ref 22–32)
Chloride: 95 mmol/L — ABNORMAL LOW (ref 101–111)
Creatinine, Ser: 1.3 mg/dL — ABNORMAL HIGH (ref 0.61–1.24)
GFR calc non Af Amer: >60 mL/min (ref >60)
GLUCOSE: 129 mg/dL — AB (ref 65–99)
Potassium: 3.5 mmol/L (ref 3.5–5.1)
SODIUM: 135 mmol/L (ref 135–145)
TOTAL PROTEIN: 5.7 g/dL — AB (ref 6.5–8.1)
Total Bilirubin: 2 mg/dL — ABNORMAL HIGH (ref 0.3–1.2)

## 2014-12-21 LAB — I-STAT CG4 LACTIC ACID, ED: Lactic Acid, Venous: 0.69 mmol/L (ref 0.5–2.0)

## 2014-12-21 LAB — CBC WITH DIFFERENTIAL/PLATELET
BASOS ABS: 0 10*3/uL (ref 0.0–0.1)
BASOS PCT: 0 % (ref 0–1)
Eosinophils Absolute: 0.1 10*3/uL (ref 0.0–0.7)
Eosinophils Relative: 2 % (ref 0–5)
HEMATOCRIT: 22.9 % — AB (ref 39.0–52.0)
Hemoglobin: 7.2 g/dL — ABNORMAL LOW (ref 13.0–17.0)
Lymphocytes Relative: 16 % (ref 12–46)
Lymphs Abs: 0.9 10*3/uL (ref 0.7–4.0)
MCH: 34.1 pg — ABNORMAL HIGH (ref 26.0–34.0)
MCHC: 31.4 g/dL (ref 30.0–36.0)
MCV: 108.5 fL — AB (ref 78.0–100.0)
MONOS PCT: 9 % (ref 3–12)
Monocytes Absolute: 0.5 10*3/uL (ref 0.1–1.0)
NEUTROS ABS: 4 10*3/uL (ref 1.7–7.7)
Neutrophils Relative %: 73 % (ref 43–77)
Platelets: 180 10*3/uL (ref 150–400)
RBC: 2.11 MIL/uL — ABNORMAL LOW (ref 4.22–5.81)
RDW: 17.2 % — ABNORMAL HIGH (ref 11.5–15.5)
WBC: 5.5 10*3/uL (ref 4.0–10.5)

## 2014-12-21 LAB — URINE MICROSCOPIC-ADD ON

## 2014-12-21 LAB — TSH: TSH: 1.69 u[IU]/mL (ref 0.350–4.500)

## 2014-12-21 LAB — LIPASE, BLOOD: LIPASE: 25 U/L (ref 22–51)

## 2014-12-21 LAB — ABO/RH: ABO/RH(D): A POS

## 2014-12-21 LAB — URINALYSIS, ROUTINE W REFLEX MICROSCOPIC
Bilirubin Urine: NEGATIVE
GLUCOSE, UA: NEGATIVE mg/dL
Ketones, ur: NEGATIVE mg/dL
Leukocytes, UA: NEGATIVE
Nitrite: NEGATIVE
PH: 5.5 (ref 5.0–8.0)
Protein, ur: 30 mg/dL — AB
Specific Gravity, Urine: 1.025 (ref 1.005–1.030)
UROBILINOGEN UA: 4 mg/dL — AB (ref 0.0–1.0)

## 2014-12-21 LAB — RETICULOCYTES
RBC.: 2.04 MIL/uL — ABNORMAL LOW (ref 4.22–5.81)
RETIC CT PCT: 10.6 % — AB (ref 0.4–3.1)
Retic Count, Absolute: 216.2 10*3/uL — ABNORMAL HIGH (ref 19.0–186.0)

## 2014-12-21 LAB — PREPARE RBC (CROSSMATCH)

## 2014-12-21 MED ORDER — LACTULOSE 10 GM/15ML PO SOLN
20.0000 g | Freq: Three times a day (TID) | ORAL | Status: DC
  Administered 2014-12-21 – 2014-12-23 (×6): 20 g via ORAL
  Filled 2014-12-21 (×12): qty 30

## 2014-12-21 MED ORDER — TIOTROPIUM BROMIDE MONOHYDRATE 18 MCG IN CAPS
18.0000 ug | ORAL_CAPSULE | Freq: Every day | RESPIRATORY_TRACT | Status: DC
  Filled 2014-12-21: qty 5

## 2014-12-21 MED ORDER — TORSEMIDE 20 MG PO TABS
40.0000 mg | ORAL_TABLET | Freq: Every day | ORAL | Status: DC
  Administered 2014-12-22 – 2014-12-23 (×2): 40 mg via ORAL
  Filled 2014-12-21 (×2): qty 2

## 2014-12-21 MED ORDER — CALCIUM CITRATE 950 (200 CA) MG PO TABS
1.0000 | ORAL_TABLET | Freq: Every day | ORAL | Status: DC
  Administered 2014-12-22 – 2014-12-23 (×2): 200 mg via ORAL
  Filled 2014-12-21 (×4): qty 1

## 2014-12-21 MED ORDER — CITALOPRAM HYDROBROMIDE 20 MG PO TABS
ORAL_TABLET | ORAL | Status: AC
  Filled 2014-12-21: qty 2

## 2014-12-21 MED ORDER — ARIPIPRAZOLE 15 MG PO TABS: 30.0000 mg | ORAL_TABLET | Freq: Every day | ORAL | Status: DC

## 2014-12-21 MED ORDER — GABAPENTIN 300 MG PO CAPS
300.0000 mg | ORAL_CAPSULE | Freq: Three times a day (TID) | ORAL | Status: DC
  Administered 2014-12-21 – 2014-12-23 (×6): 300 mg via ORAL
  Filled 2014-12-21 (×6): qty 1

## 2014-12-21 MED ORDER — CITALOPRAM HYDROBROMIDE 20 MG PO TABS
40.0000 mg | ORAL_TABLET | Freq: Every day | ORAL | Status: DC
  Administered 2014-12-21 – 2014-12-22 (×2): 40 mg via ORAL
  Filled 2014-12-21: qty 2
  Filled 2014-12-21: qty 1
  Filled 2014-12-21 (×2): qty 2
  Filled 2014-12-21: qty 1

## 2014-12-21 MED ORDER — RIFAXIMIN 200 MG PO TABS
200.0000 mg | ORAL_TABLET | Freq: Every day | ORAL | Status: DC
  Administered 2014-12-22 – 2014-12-23 (×2): 200 mg via ORAL
  Filled 2014-12-21 (×4): qty 1

## 2014-12-21 MED ORDER — IPRATROPIUM-ALBUTEROL 0.5-2.5 (3) MG/3ML IN SOLN
3.0000 mL | Freq: Three times a day (TID) | RESPIRATORY_TRACT | Status: DC
  Administered 2014-12-21 – 2014-12-23 (×6): 3 mL via RESPIRATORY_TRACT
  Filled 2014-12-21 (×6): qty 3

## 2014-12-21 MED ORDER — SODIUM CHLORIDE 0.9 % IV SOLN: 10.0000 mL/h | Freq: Once | INTRAVENOUS | Status: DC

## 2014-12-21 MED ORDER — ONDANSETRON HCL 4 MG PO TABS: 4.0000 mg | ORAL_TABLET | Freq: Four times a day (QID) | ORAL | Status: DC | PRN

## 2014-12-21 MED ORDER — ENOXAPARIN SODIUM 40 MG/0.4ML ~~LOC~~ SOLN: 40.0000 mg | SUBCUTANEOUS | Status: DC

## 2014-12-21 MED ORDER — OXYCODONE-ACETAMINOPHEN 5-325 MG PO TABS
1.0000 | ORAL_TABLET | ORAL | Status: DC | PRN
  Administered 2014-12-21 – 2014-12-23 (×8): 1 via ORAL
  Filled 2014-12-21 (×8): qty 1

## 2014-12-21 MED ORDER — ALBUTEROL SULFATE (2.5 MG/3ML) 0.083% IN NEBU: 3.0000 mL | INHALATION_SOLUTION | Freq: Four times a day (QID) | RESPIRATORY_TRACT | Status: DC | PRN

## 2014-12-21 MED ORDER — TIOTROPIUM BROMIDE MONOHYDRATE 2.5 MCG/ACT IN AERS: 1.0000 | INHALATION_SPRAY | Freq: Every day | RESPIRATORY_TRACT | Status: DC

## 2014-12-21 MED ORDER — SODIUM CHLORIDE 0.9 % IV SOLN: INTRAVENOUS | Status: DC

## 2014-12-21 MED ORDER — SODIUM CHLORIDE 0.9 % IJ SOLN
3.0000 mL | Freq: Two times a day (BID) | INTRAMUSCULAR | Status: DC
Start: 2014-12-21 — End: 2014-12-23
  Administered 2014-12-21 – 2014-12-23 (×4): 3 mL via INTRAVENOUS

## 2014-12-21 MED ORDER — ARIPIPRAZOLE 10 MG PO TABS
30.0000 mg | ORAL_TABLET | Freq: Every day | ORAL | Status: DC
  Administered 2014-12-22 – 2014-12-23 (×2): 30 mg via ORAL
  Filled 2014-12-21 (×2): qty 3

## 2014-12-21 MED ORDER — CIPROFLOXACIN IN D5W 400 MG/200ML IV SOLN
400.0000 mg | Freq: Once | INTRAVENOUS | Status: AC
  Administered 2014-12-21: 400 mg via INTRAVENOUS
  Filled 2014-12-21: qty 200

## 2014-12-21 MED ORDER — NADOLOL 20 MG PO TABS
20.0000 mg | ORAL_TABLET | Freq: Every day | ORAL | Status: DC
  Administered 2014-12-22 – 2014-12-23 (×2): 20 mg via ORAL
  Filled 2014-12-21 (×4): qty 1

## 2014-12-21 MED ORDER — FENTANYL CITRATE (PF) 100 MCG/2ML IJ SOLN
50.0000 ug | Freq: Once | INTRAMUSCULAR | Status: AC
  Administered 2014-12-21: 50 ug via INTRAVENOUS
  Filled 2014-12-21: qty 2

## 2014-12-21 MED ORDER — SODIUM CHLORIDE 0.9 % IJ SOLN: 10.0000 mL | Freq: Two times a day (BID) | INTRAMUSCULAR | Status: DC

## 2014-12-21 MED ORDER — SODIUM CHLORIDE 0.9 % IV SOLN
INTRAVENOUS | Status: DC
  Administered 2014-12-21 – 2014-12-23 (×2): via INTRAVENOUS

## 2014-12-21 MED ORDER — SODIUM CHLORIDE 0.9 % IJ SOLN
10.0000 mL | INTRAMUSCULAR | Status: DC | PRN
  Administered 2014-12-21: 30 mL
  Filled 2014-12-21: qty 40

## 2014-12-21 MED ORDER — VITAMIN D 1000 UNITS PO TABS
1000.0000 [IU] | ORAL_TABLET | Freq: Every day | ORAL | Status: DC
  Administered 2014-12-22 – 2014-12-23 (×2): 1000 [IU] via ORAL
  Filled 2014-12-21 (×4): qty 1

## 2014-12-21 MED ORDER — LACTULOSE ENCEPHALOPATHY 10 GM/15ML PO SOLN: 20.0000 g | Freq: Three times a day (TID) | ORAL | Status: DC

## 2014-12-21 MED ORDER — ONDANSETRON HCL 4 MG/2ML IJ SOLN: 4.0000 mg | Freq: Four times a day (QID) | INTRAMUSCULAR | Status: DC | PRN

## 2014-12-21 MED ORDER — TRAZODONE HCL 50 MG PO TABS
150.0000 mg | ORAL_TABLET | Freq: Every day | ORAL | Status: DC
  Administered 2014-12-21 – 2014-12-22 (×2): 150 mg via ORAL
  Filled 2014-12-21 (×2): qty 3

## 2014-12-21 MED ORDER — ALBUTEROL SULFATE HFA 108 (90 BASE) MCG/ACT IN AERS
2.0000 | INHALATION_SPRAY | Freq: Four times a day (QID) | RESPIRATORY_TRACT | Status: DC | PRN
  Filled 2014-12-21: qty 6.7

## 2014-12-21 NOTE — ED Notes (Signed)
Xray here to do portable x-rays.

## 2014-12-21 NOTE — ED Notes (Signed)
Patient's left leg is wrapped from foot to upper thigh. Patient has a catheter that was placed today while in [facility]. Staff reports low output. Small amount of urine in bag is tea color.

## 2014-12-21 NOTE — ED Notes (Signed)
Patient tolerating Ginger ale.

## 2014-12-21 NOTE — ED Notes (Signed)
hospitalist at bedside

## 2014-12-21 NOTE — ED Notes (Signed)
Patient has signed consent form for blood products.

## 2014-12-21 NOTE — ED Notes (Signed)
MD at bedside. 

## 2014-12-21 NOTE — ED Notes (Signed)
Patient given ginger ale to drink 

## 2014-12-21 NOTE — Discharge Instructions (Signed)
PICC Insertion, Care After Refer to this sheet in the next few weeks. These instructions provide you with information on caring for yourself after your procedure. Your health care provider may also give you more specific instructions. Your treatment has been planned according to current medical practices, but problems sometimes occur. Call your health care provider if you have any problems or questions after your procedure. WHAT TO EXPECT AFTER THE PROCEDURE After your procedure, it is typical to have the following:  Mild discomfort at the insertion site. This should not last more than a day. HOME CARE INSTRUCTIONS  Rest at home for the remainder of the day after the procedure.  You may bend your arm and move it freely. If your PICC is near or at the bend of your elbow, avoid activity with repeated motion at the elbow.  Avoid lifting heavy objects as instructed by your health care provider.  Avoid using a crutch with the arm on the same side as your PICC. You may need to use a walker. Bandage Care  Keep your PICC bandage (dressing) clean and dry to prevent infection.  Ask your health care provider when you may shower. To keep the dressing dry, cover the PICC with plastic wrap and tape before showering. If the dressing does become wet, replace it right after the shower.  Do not soak in the bath, swim, or use hot tubs when you have a PICC.  Change the PICC dressing as instructed by your health care provider.  Change your PICC dressing if it becomes loose or wet. General PICC Care  Check the PICC insertion site daily for leakage, redness, swelling, or pain.  Flush the PICC as directed by your health care provider. Let your health care provider know right away if the PICC is difficult to flush or does not flush. Do not use force to flush the PICC.  Do not use a syringe that is less than 10 mL to flush the PICC.  Never pull or tug on the PICC.  Avoid blood pressure checks on the arm  with the PICC.  Keep your PICC identification card with you at all times.  Do not take the PICC out yourself. Only a trained health care professional should remove the PICC. SEEK MEDICAL CARE IF:  You have pain in your arm, ear, face, or teeth.  You have fever or chills.  You have drainage from the PICC insertion site.  You have redness or palpate a "cord" around the PICC insertion site.  You cannot flush the catheter. SEEK IMMEDIATE MEDICAL CARE IF:  You have swelling in the arm in which the PICC is inserted. Document Released: 02/19/2013 Document Revised: 05/06/2013 Document Reviewed: 02/19/2013 [facility] Patient Information 2015 [place], [place]. This information is not intended to replace advice given to you by your health care provider. Make sure you discuss any questions you have with your health care provider. PICC Home Guide A peripherally inserted central catheter (PICC) is a long, thin, flexible tube that is inserted into a vein in the upper arm. It is a form of intravenous (IV) access. It is considered to be a "central" line because the tip of the PICC ends in a large vein in your chest. This large vein is called the superior vena cava (SVC). The PICC tip ends in the SVC because there is a lot of blood flow in the SVC. This allows medicines and IV fluids to be quickly distributed throughout the body. The PICC is inserted using a  sterile technique by a specially trained nurse or physician. After the PICC is inserted, a chest X-ray exam is done to be sure it is in the correct place.  A PICC may be placed for different reasons, such as:  To give medicines and liquid nutrition that can only be given through a central line. Examples are:  Certain antibiotic treatments.  Chemotherapy.  Total parenteral nutrition (TPN).  To take frequent blood samples.  To give IV fluids and blood products.  If there is difficulty placing a peripheral intravenous (PIV) catheter. If taken care  of properly, a PICC can remain in place for several months. A PICC can also allow a person to go home from the hospital early. Medicine and PICC care can be managed at home by a family member or home health care team. [place] A PICC? Problems with a PICC can occasionally occur. These may include the following:  A blood clot (thrombus) forming in or at the tip of the PICC. This can cause the PICC to become clogged. A clot-dissolving medicine called tissue plasminogen activator (tPA) can be given through the PICC to help break up the clot.  Inflammation of the vein (phlebitis) in which the PICC is placed. Signs of inflammation may include redness, pain at the insertion site, red streaks, or being able to feel a "cord" in the vein where the PICC is located.  Infection in the PICC or at the insertion site. Signs of infection may include fever, chills, redness, swelling, or pus drainage from the PICC insertion site.  PICC movement (malposition). The PICC tip may move from its original position due to excessive physical activity, forceful coughing, sneezing, or vomiting.  A break or cut in the PICC. It is important to not use scissors near the PICC.  Nerve or tendon irritation or injury during PICC insertion. WHAT SHOULD I KEEP IN MIND ABOUT ACTIVITIES WHEN I HAVE A PICC?  You may bend your arm and move it freely. If your PICC is near or at the bend of your elbow, avoid activity with repeated motion at the elbow.  Rest at home for the remainder of the day following PICC line insertion.  Avoid lifting heavy objects as instructed by your health care provider.  Avoid using a crutch with the arm on the same side as your PICC. You may need to use a walker. WHAT SHOULD I KNOW ABOUT MY PICC DRESSING?  Keep your PICC bandage (dressing) clean and dry to prevent infection.  Ask your health care provider when you may shower. Ask your health care provider to teach you how to  wrap the PICC when you do take a shower.  Change the PICC dressing as instructed by your health care provider.  Change your PICC dressing if it becomes loose or wet. WHAT SHOULD I KNOW ABOUT PICC CARE?  Check the PICC insertion site daily for leakage, redness, swelling, or pain.  Do not take a bath, swim, or use hot tubs when you have a PICC. Cover PICC line with clear plastic wrap and tape to keep it dry while showering.  Flush the PICC as directed by your health care provider. Let your health care provider know right away if the PICC is difficult to flush or does not flush. Do not use force to flush the PICC.  Do not use a syringe that is less than 10 mL to flush the PICC.  Never pull or tug on the PICC.  Avoid blood pressure checks on the arm with the PICC.  Keep your PICC identification card with you at all times.  Do not take the PICC out yourself. Only a trained clinical professional should remove the PICC. SEEK IMMEDIATE MEDICAL CARE IF:  Your PICC is accidentally pulled all the way out. If this happens, cover the insertion site with a bandage or gauze dressing. Do not throw the PICC away. Your health care provider will need to inspect it.  Your PICC was tugged or pulled and has partially come out. Do not  push the PICC back in.  There is any type of drainage, redness, or swelling where the PICC enters the skin.  You cannot flush the PICC, it is difficult to flush, or the PICC leaks around the insertion site when it is flushed.  You hear a "flushing" sound when the PICC is flushed.  You have pain, discomfort, or numbness in your arm, shoulder, or jaw on the same side as the PICC.  You feel your heart "racing" or skipping beats.  You notice a hole or tear in the PICC.  You develop chills or a fever. MAKE SURE YOU:   Understand these instructions.  Will watch your condition.  Will get help right away if you are not doing well or get worse. Document Released:  11/05/2002 Document Revised: 09/15/2013 Document Reviewed: 01/06/2013 [facility] Patient Information 2015 [place], [place]. This information is not intended to replace advice given to you by your health care provider. Make sure you discuss any questions you have with your health care provider.

## 2014-12-21 NOTE — ED Provider Notes (Signed)
CSN: [id number]     Arrival date & time 12/21/14  1432 History   First MD Initiated Contact with Patient 12/21/14 1508     Chief Complaint  Patient presents with  . Weakness     (Consider location/radiation/quality/duration/timing/severity/associated sxs/prior Treatment) Patient is a 49 y.o. male presenting with weakness. The history is provided by the patient and the nursing home.  Weakness Pertinent negatives include no chest pain, no abdominal pain, no headaches and no shortness of breath.   patient sent over from [place] facility for hemoglobin the sevens. Patient was over in that outpatient surgical area have a PICC line placed which was done. On exactly clear why patient was to have the PICC line. Patient blood work done which showed a hemoglobin of 7.3 and sent here for eval. Patient status post fall from another nursing home on July 25 resulting in a wound to the back of his left calf and a proximal comminuted tibia fracture. This was evaluated in ED also seen by Dr. Luna Glasgow in ED patient was transferred to [facility] for open reduction internal fixation and for skin graft. Believe patient has been back at [facility] for about a week.  Past Medical History  Diagnosis Date  . COPD (chronic obstructive pulmonary disease)   . Cirrhosis sleep apnea  . Hypertension   . Renal disorder     stage II  . Sleep apnea   . Tobacco abuse   . Syncope   . Suicide attempt    Past Surgical History  Procedure Laterality Date  . Cholecystectomy     Family History  Problem Relation Age of Onset  . Heart disease Father   . Cancer Mother     breast   History  Substance Use Topics  . Smoking status: Current Every Day Smoker -- 0.50 packs/day    Types: Cigarettes  . Smokeless tobacco: Not on file  . Alcohol Use: No    Review of Systems  Constitutional: Positive for fever.  HENT: Negative for congestion.   Eyes: Negative for redness.  Respiratory: Negative for shortness of breath.    Cardiovascular: Positive for leg swelling. Negative for chest pain.  Gastrointestinal: Negative for abdominal pain.  Genitourinary: Negative for hematuria.  Skin: Positive for wound. Negative for rash.  Neurological: Positive for weakness. Negative for headaches.  Hematological: Does not bruise/bleed easily.  Psychiatric/Behavioral: Negative for confusion.      Allergies  Penicillins  Home Medications   Prior to Admission medications   Medication Sig Start Date End Date Taking? Authorizing Provider  ARIPiprazole (ABILIFY) 30 MG tablet Take 30 mg by mouth daily.  12/03/13  Yes Historical Provider, MD  calcium citrate (CALCITRATE - DOSED IN MG ELEMENTAL CALCIUM) 950 MG tablet Take 1 tablet by mouth daily.   Yes Historical Provider, MD  cholecalciferol (VITAMIN D) 1000 UNITS tablet Take 1,000 Units by mouth daily.   Yes Historical Provider, MD  citalopram (CELEXA) 40 MG tablet Take 40 mg by mouth at bedtime.  12/03/13  Yes Historical Provider, MD  enoxaparin (LOVENOX) 40 MG/0.4ML injection Inject 40 mg into the skin daily.   Yes Historical Provider, MD  Fe Bisgly-Succ-C-Thre-B12-FA (IRON-150 PO) Take 1 tablet by mouth daily.   Yes Historical Provider, MD  gabapentin (NEURONTIN) 300 MG capsule Take 300 mg by mouth 3 (three) times daily. 12/03/13  Yes Historical Provider, MD  HYDROmorphone (DILAUDID) 2 MG tablet Take 2 mg by mouth every 4 (four) hours as needed for moderate pain or severe pain.  Yes Historical Provider, MD  ipratropium-albuterol (DUONEB) 0.5-2.5 (3) MG/3ML SOLN Take 3 mLs by nebulization 3 (three) times daily. 11/01/14  Yes Rosita Fire, MD  lactulose, encephalopathy, (CHRONULAC) 10 GM/15ML SOLN Take 20 g by mouth 3 (three) times daily.   Yes Historical Provider, MD  nadolol (CORGARD) 20 MG tablet Take 20 mg by mouth daily.   Yes Historical Provider, MD  oxyCODONE-acetaminophen (PERCOCET/ROXICET) 5-325 MG per tablet Take 1 tablet by mouth every 4 (four) hours as needed for  moderate pain or severe pain.   Yes Historical Provider, MD  OXYGEN Inhale 3 L into the lungs daily.   Yes Historical Provider, MD  PROAIR HFA 108 (90 BASE) MCG/ACT inhaler Inhale 2 puffs into the lungs every 6 (six) hours as needed for wheezing or shortness of breath. Shortness of breath/wheeze 12/05/13  Yes Historical Provider, MD  rifaximin (XIFAXAN) 200 MG tablet Take 200 mg by mouth daily.   Yes Historical Provider, MD  Tiotropium Bromide Monohydrate 2.5 MCG/ACT AERS Inhale 1 capsule into the lungs daily.    Yes Historical Provider, MD  torsemide (DEMADEX) 20 MG tablet Take 40 mg by mouth daily.   Yes Historical Provider, MD  traZODone (DESYREL) 50 MG tablet Take 150 mg by mouth at bedtime.   Yes Historical Provider, MD   BP 105/46 mmHg  Pulse 63  Temp(Src) 97.5 F (36.4 C) (Oral)  Resp 16  Ht 6\' 3"  (1.905 m)  Wt 335 lb (151.955 kg)  BMI 41.87 kg/m2  SpO2 100% Physical Exam  Constitutional: He is oriented to person, place, and time. He appears well-developed and well-nourished. No distress.  HENT:  Head: Normocephalic and atraumatic.  Mouth/Throat: Oropharynx is clear and moist.  Eyes: Conjunctivae and EOM are normal. Pupils are equal, round, and reactive to light.  Neck: Normal range of motion.  Cardiovascular: Normal rate, regular rhythm and normal heart sounds.   No murmur heard. Pulmonary/Chest: Effort normal and breath sounds normal. No respiratory distress.  Abdominal: Soft. Bowel sounds are normal. There is no tenderness.  Musculoskeletal: Normal range of motion. He exhibits edema.  Left leg was significant swelling surgical incisions around the proximal part of the tibia 2 with staples still in place. No evidence of any erythema. Skin graft to the back of the left calf. Marked swelling throughout the leg no erythema. No purulent discharge.  Neurological: He is alert and oriented to person, place, and time. No cranial nerve deficit. He exhibits normal muscle tone.  Coordination normal.  Skin: Skin is warm. No rash noted.  Nursing note and vitals reviewed.   ED Course  Procedures (including critical care time) Labs Review Labs Reviewed  COMPREHENSIVE METABOLIC PANEL - Abnormal; Notable for the following:    Chloride 95 (*)    Glucose, Bld 129 (*)    BUN 27 (*)    Creatinine, Ser 1.30 (*)    Calcium 7.4 (*)    Total Protein 5.7 (*)    Albumin 2.4 (*)    Total Bilirubin 2.0 (*)    All other components within normal limits  CBC WITH DIFFERENTIAL/PLATELET - Abnormal; Notable for the following:    RBC 2.11 (*)    Hemoglobin 7.2 (*)    HCT 22.9 (*)    MCV 108.5 (*)    MCH 34.1 (*)    RDW 17.2 (*)    All other components within normal limits  URINALYSIS, ROUTINE W REFLEX MICROSCOPIC (NOT AT [facility]) - Abnormal; Notable for the following:  Color, Urine AMBER (*)    Hgb urine dipstick LARGE (*)    Protein, ur 30 (*)    Urobilinogen, UA 4.0 (*)    All other components within normal limits  URINE MICROSCOPIC-ADD ON - Abnormal; Notable for the following:    Squamous Epithelial / LPF FEW (*)    Bacteria, UA FEW (*)    Casts HYALINE CASTS (*)    Crystals CA OXALATE CRYSTALS (*)    All other components within normal limits  URINE CULTURE  CULTURE, BLOOD (ROUTINE X 2)  CULTURE, BLOOD (ROUTINE X 2)  LIPASE, BLOOD  I-STAT CG4 LACTIC ACID, ED  I-STAT CG4 LACTIC ACID, ED  PREPARE RBC (CROSSMATCH)  TYPE AND SCREEN   Results for orders placed or performed during the hospital encounter of 12/21/14  Comprehensive metabolic panel  Result Value Ref Range   Sodium 135 135 - 145 mmol/L   Potassium 3.5 3.5 - 5.1 mmol/L   Chloride 95 (L) 101 - 111 mmol/L   CO2 31 22 - 32 mmol/L   Glucose, Bld 129 (H) 65 - 99 mg/dL   BUN 27 (H) 6 - 20 mg/dL   Creatinine, Ser 1.30 (H) 0.61 - 1.24 mg/dL   Calcium 7.4 (L) 8.9 - 10.3 mg/dL   Total Protein 5.7 (L) 6.5 - 8.1 g/dL   Albumin 2.4 (L) 3.5 - 5.0 g/dL   AST 37 15 - 41 U/L   ALT 32 17 - 63 U/L   Alkaline  Phosphatase 84 38 - 126 U/L   Total Bilirubin 2.0 (H) 0.3 - 1.2 mg/dL   GFR calc non Af Amer >60 >60 mL/min   GFR calc Af Amer >60 >60 mL/min   Anion gap 9 5 - 15  Lipase, blood  Result Value Ref Range   Lipase 25 22 - 51 U/L  CBC with Differential/Platelet  Result Value Ref Range   WBC 5.5 4.0 - 10.5 K/uL   RBC 2.11 (L) 4.22 - 5.81 MIL/uL   Hemoglobin 7.2 (L) 13.0 - 17.0 g/dL   HCT 22.9 (L) 39.0 - 52.0 %   MCV 108.5 (H) 78.0 - 100.0 fL   MCH 34.1 (H) 26.0 - 34.0 pg   MCHC 31.4 30.0 - 36.0 g/dL   RDW 17.2 (H) 11.5 - 15.5 %   Platelets 180 150 - 400 K/uL   Neutrophils Relative % 73 43 - 77 %   Neutro Abs 4.0 1.7 - 7.7 K/uL   Lymphocytes Relative 16 12 - 46 %   Lymphs Abs 0.9 0.7 - 4.0 K/uL   Monocytes Relative 9 3 - 12 %   Monocytes Absolute 0.5 0.1 - 1.0 K/uL   Eosinophils Relative 2 0 - 5 %   Eosinophils Absolute 0.1 0.0 - 0.7 K/uL   Basophils Relative 0 0 - 1 %   Basophils Absolute 0.0 0.0 - 0.1 K/uL  Urinalysis, Routine w reflex microscopic (not at [facility])  Result Value Ref Range   Color, Urine AMBER (A) YELLOW   APPearance CLEAR CLEAR   Specific Gravity, Urine 1.025 1.005 - 1.030   pH 5.5 5.0 - 8.0   Glucose, UA NEGATIVE NEGATIVE mg/dL   Hgb urine dipstick LARGE (A) NEGATIVE   Bilirubin Urine NEGATIVE NEGATIVE   Ketones, ur NEGATIVE NEGATIVE mg/dL   Protein, ur 30 (A) NEGATIVE mg/dL   Urobilinogen, UA 4.0 (H) 0.0 - 1.0 mg/dL   Nitrite NEGATIVE NEGATIVE   Leukocytes, UA NEGATIVE NEGATIVE  Urine microscopic-add on  Result Value Ref  Range   Squamous Epithelial / LPF FEW (A) RARE   WBC, UA 11-20 <3 WBC/hpf   RBC / HPF 21-50 <3 RBC/hpf   Bacteria, UA FEW (A) RARE   Casts HYALINE CASTS (A) NEGATIVE   Crystals CA OXALATE CRYSTALS (A) NEGATIVE  I-Stat CG4 Lactic Acid, ED  Result Value Ref Range   Lactic Acid, Venous 0.69 0.5 - 2.0 mmol/L    Imaging Review Dg Tibia/fibula Left  12/21/2014   CLINICAL DATA:  49 year old male with recent history of fall with multiple  fractures in the left leg status post ORIF. Weakness.  EXAM: LEFT TIBIA AND FIBULA - 2 VIEW  COMPARISON:  12/07/2014.  FINDINGS: Again noted is a comminuted fracture of the proximal tibia predominantly involving the metadiaphysis, but with intra-articular extension through the tibial spine. Proximal fibular fracture also noted, but poorly visualized on today's radiographs. Osteochondral injury in the lateral femoral condyle again noted. Compared to the prior study there has been interval ORIF of the proximal tibial fractures, with medial and lateral plate and screw fixation devices in position. Multiple skin staples are noted along the medial aspect of the calf. Soft tissues appear diffusely swollen.  IMPRESSION: 1. Multiple fractures of the left lower extremity redemonstrated, with postoperative changes of ORIF in the left proximal tibia, as above. No acute complicating features.   Electronically Signed   By: Vinnie Langton M.D.   On: 12/21/2014 16:51   Dg Chest Port 1 View  12/21/2014   CLINICAL DATA:  50 year old male with weakness and history of recent fall a few weeks ago.  EXAM: PORTABLE CHEST - 1 VIEW  COMPARISON:  Prior chest x-ray 12/07/2014  FINDINGS: Stable cardiomegaly. Increased pulmonary vascular congestion now bordering on mild interstitial edema. No large pleural effusion or evidence of pneumothorax. The patient is rotated to the left. Examination is slightly limited by underpenetration. No acute osseous abnormality.  IMPRESSION: Cardiomegaly with pulmonary vascular congestion bordering on mild interstitial edema. Findings suggest early/mild CHF.   Electronically Signed   By: Jacqulynn Cadet M.D.   On: 12/21/2014 16:41     EKG Interpretation None      CRITICAL CARE Performed by: Fredia Sorrow Total critical care time: 30 Critical care time was exclusive of separately billable procedures and treating other patients. Critical care was necessary to treat or prevent imminent or  life-threatening deterioration. Critical care was time spent personally by me on the following activities: development of treatment plan with patient and/or surrogate as well as nursing, discussions with consultants, evaluation of patient's response to treatment, examination of patient, obtaining history from patient or surrogate, ordering and performing treatments and interventions, ordering and review of laboratory studies, ordering and review of radiographic studies, pulse oximetry and re-evaluation of patient's condition.    MDM   Final diagnoses:  Fever  Anemia, unspecified anemia type  UTI (lower urinary tract infection)    The patient with sniffing anemia will need slow blood transfusion. 2 units ordered. To be started here in the emergency department. Patient will be admitted to telemetry. Patient came from [place] home was sent over to have a PICC line placed which was done but exactly sure why this was required. Patient had a fall from another rest home back on July 25 resulting in a significant wound to the left calf and a fracture of the proximal tibia patient was seen by Dr. Luna Glasgow here in the ED felt that it was complicated and patient was transferred to [facility] where he had  an open reduction internal fixation, also appears patient has skin graft placed to the  left calf.   Patient's urine here is suggestive urinary tract infection hours probably had a Foley in place long-term. Urine culture sent will be started on Cipro.  Patient's blood pressure is been fine here patient talked about having potential fever but no fever here currently.    Fredia Sorrow, MD 12/21/14 [phone]

## 2014-12-21 NOTE — ED Notes (Signed)
Patient is from [place] and was brought in today to have a PICC line placed. Patient fell while staying at [facility], "a couple weeks ago" and has wound to left leg, patient reports leg "is broken in two places" and that is why he was moved to [place]. During visit today, patient was noticed to be very weak and hemoglobin 7.3. Patient brought here for eval.

## 2014-12-21 NOTE — H&P (Signed)
[facility] History and Physical  Jorge [name] VZD:[id number] DOB: July 11, 1965 DOA: 12/21/2014  Referring physician: ER PCP: Rosita Fire, MD   Chief Complaint: Weakness  HPI: Jorge [name] is a 49 y.o. male  This is a 49 year old man who is slightly drowsy from analgesia but has been given to him today and he presents because he is feeling somewhat weak. He had been brought into the hospital for PICC line placement and at some point today hemoglobin was done which was 7.3. He is now sent to the emergency room for further evaluation. He denies any hematemesis, rectal bleeding, hematuria. Approximately 2 weeks ago he had an accidental fall in another nursing home and sustained a comminuted fracture to the left lower leg affecting his tibia and fibula. He was evaluated in the emergency room by Dr. Luna Glasgow, orthopedics, and was eventually sent to [facility] for further management. At [facility] he had open reduction, internal fixation and skin graft. He has been at [facility] home for the last week or so. He is now being managed for further management.   Review of Systems:  Apart from symptoms above, all systems negative.  Past Medical History  Diagnosis Date  . COPD (chronic obstructive pulmonary disease)   . Cirrhosis sleep apnea  . Hypertension   . Renal disorder     stage II  . Sleep apnea   . Tobacco abuse   . Syncope   . Suicide attempt    Past Surgical History  Procedure Laterality Date  . Cholecystectomy     Social History:  reports that he has been smoking Cigarettes.  He has been smoking about 0.50 packs per day. He does not have any smokeless tobacco history on file. He reports that he does not drink alcohol or use illicit drugs.  Allergies  Allergen Reactions  . Penicillins Anaphylaxis    Patient states he is not allergic to anything;  Pt says it's his twin brother that has this allergy.    Family History  Problem Relation Age of Onset  . Heart  disease Father   . Cancer Mother     breast    Prior to Admission medications   Medication Sig Start Date End Date Taking? Authorizing Provider  ARIPiprazole (ABILIFY) 30 MG tablet Take 30 mg by mouth daily.  12/03/13  Yes Historical Provider, MD  calcium citrate (CALCITRATE - DOSED IN MG ELEMENTAL CALCIUM) 950 MG tablet Take 1 tablet by mouth daily.   Yes Historical Provider, MD  cholecalciferol (VITAMIN D) 1000 UNITS tablet Take 1,000 Units by mouth daily.   Yes Historical Provider, MD  citalopram (CELEXA) 40 MG tablet Take 40 mg by mouth at bedtime.  12/03/13  Yes Historical Provider, MD  enoxaparin (LOVENOX) 40 MG/0.4ML injection Inject 40 mg into the skin daily.   Yes Historical Provider, MD  Fe Bisgly-Succ-C-Thre-B12-FA (IRON-150 PO) Take 1 tablet by mouth daily.   Yes Historical Provider, MD  gabapentin (NEURONTIN) 300 MG capsule Take 300 mg by mouth 3 (three) times daily. 12/03/13  Yes Historical Provider, MD  HYDROmorphone (DILAUDID) 2 MG tablet Take 2 mg by mouth every 4 (four) hours as needed for moderate pain or severe pain.   Yes Historical Provider, MD  ipratropium-albuterol (DUONEB) 0.5-2.5 (3) MG/3ML SOLN Take 3 mLs by nebulization 3 (three) times daily. 11/01/14  Yes Rosita Fire, MD  lactulose, encephalopathy, (CHRONULAC) 10 GM/15ML SOLN Take 20 g by mouth 3 (three) times daily.   Yes Historical Provider, MD  nadolol (  CORGARD) 20 MG tablet Take 20 mg by mouth daily.   Yes Historical Provider, MD  oxyCODONE-acetaminophen (PERCOCET/ROXICET) 5-325 MG per tablet Take 1 tablet by mouth every 4 (four) hours as needed for moderate pain or severe pain.   Yes Historical Provider, MD  OXYGEN Inhale 3 L into the lungs daily.   Yes Historical Provider, MD  PROAIR HFA 108 (90 BASE) MCG/ACT inhaler Inhale 2 puffs into the lungs every 6 (six) hours as needed for wheezing or shortness of breath. Shortness of breath/wheeze 12/05/13  Yes Historical Provider, MD  rifaximin (XIFAXAN) 200 MG tablet  Take 200 mg by mouth daily.   Yes Historical Provider, MD  Tiotropium Bromide Monohydrate 2.5 MCG/ACT AERS Inhale 1 capsule into the lungs daily.    Yes Historical Provider, MD  torsemide (DEMADEX) 20 MG tablet Take 40 mg by mouth daily.   Yes Historical Provider, MD  traZODone (DESYREL) 50 MG tablet Take 150 mg by mouth at bedtime.   Yes Historical Provider, MD   Physical Exam: Filed Vitals:   12/21/14 1530 12/21/14 1600 12/21/14 1630 12/21/14 1820  BP: 107/59 106/56 105/46 96/58  Pulse:  63  58  Temp:    97.6 F (36.4 C)  TempSrc:    Oral  Resp:  13 16 13   Height:      Weight:      SpO2:  100%  94%    Wt Readings from Last 3 Encounters:  12/21/14 151.955 kg (335 lb)  12/07/14 149.687 kg (330 lb)  11/01/14 145.877 kg (321 lb 9.6 oz)    General:  Appears somewhat drowsy. However he is able to respond to my questions and commands. He clinically does not look pale. He is morbidly obese. He does not have any increased work of breathing. Eyes: PERRL, normal lids, irises & conjunctiva ENT: grossly normal hearing, lips & tongue Neck: no LAD, masses or thyromegaly Cardiovascular: RRR, no m/r/g. No LE edema. Telemetry: SR, no arrhythmias  Respiratory: CTA bilaterally, no w/r/r. Normal respiratory effort. Abdomen: soft, ntnd Skin: no rash or induration seen on limited exam Musculoskeletal: grossly normal tone BUE/BLE Psychiatric: grossly normal mood and affect, speech fluent and appropriate Neurologic: grossly non-focal.          Labs on Admission:  Basic Metabolic Panel:  Recent Labs Lab 12/21/14 1541  NA 135  K 3.5  CL 95*  CO2 31  GLUCOSE 129*  BUN 27*  CREATININE 1.30*  CALCIUM 7.4*   Liver Function Tests:  Recent Labs Lab 12/21/14 1541  AST 37  ALT 32  ALKPHOS 84  BILITOT 2.0*  PROT 5.7*  ALBUMIN 2.4*    Recent Labs Lab 12/21/14 1541  LIPASE 25   No results for input(s): AMMONIA in the last 168 hours. CBC:  Recent Labs Lab 12/21/14 1541  WBC  5.5  NEUTROABS 4.0  HGB 7.2*  HCT 22.9*  MCV 108.5*  PLT 180   Cardiac Enzymes: No results for input(s): CKTOTAL, CKMB, CKMBINDEX, TROPONINI in the last 168 hours.  BNP (last 3 results)  Recent Labs  10/27/14 1131  BNP 136.0*    ProBNP (last 3 results)  Recent Labs  03/18/14 1249  PROBNP 209.9*    CBG: No results for input(s): GLUCAP in the last 168 hours.  Radiological Exams on Admission: Dg Tibia/fibula Left  12/21/2014   CLINICAL DATA:  49 year old male with recent history of fall with multiple fractures in the left leg status post ORIF. Weakness.  EXAM: LEFT TIBIA AND  FIBULA - 2 VIEW  COMPARISON:  12/07/2014.  FINDINGS: Again noted is a comminuted fracture of the proximal tibia predominantly involving the metadiaphysis, but with intra-articular extension through the tibial spine. Proximal fibular fracture also noted, but poorly visualized on today's radiographs. Osteochondral injury in the lateral femoral condyle again noted. Compared to the prior study there has been interval ORIF of the proximal tibial fractures, with medial and lateral plate and screw fixation devices in position. Multiple skin staples are noted along the medial aspect of the calf. Soft tissues appear diffusely swollen.  IMPRESSION: 1. Multiple fractures of the left lower extremity redemonstrated, with postoperative changes of ORIF in the left proximal tibia, as above. No acute complicating features.   Electronically Signed   By: Vinnie Langton M.D.   On: 12/21/2014 16:51   Dg Chest Port 1 View  12/21/2014   CLINICAL DATA:  49 year old male with weakness and history of recent fall a few weeks ago.  EXAM: PORTABLE CHEST - 1 VIEW  COMPARISON:  Prior chest x-ray 12/07/2014  FINDINGS: Stable cardiomegaly. Increased pulmonary vascular congestion now bordering on mild interstitial edema. No large pleural effusion or evidence of pneumothorax. The patient is rotated to the left. Examination is slightly limited by  underpenetration. No acute osseous abnormality.  IMPRESSION: Cardiomegaly with pulmonary vascular congestion bordering on mild interstitial edema. Findings suggest early/mild CHF.   Electronically Signed   By: Jacqulynn Cadet M.D.   On: 12/21/2014 16:41      Assessment/Plan   1. Macrocytic anemia, symptomatic. He will require blood transfusion. His MCV is significantly elevated. This may reflect cirrhosis/liver disease or a deficiency or even hemolysis. We will check an anemia panel.  2. Chronic kidney disease. Stable. 3. Status post left leg fracture and postsurgical. I will request orthopedic to review for any further recommendations. 4. Chronic pain. I will reduce narcotic use in this patient who is somewhat drowsy at this point.  Further recommendations will depend on patient's hospital progress.   Code Status: Full code.   DVT Prophylaxis: Lovenox.  Family Communication: I discussed the plan with the patient at the bedside.   Disposition Plan: Back to the nursing home when medically stable.   Time spent: 60 minutes.  Doree Albee [facility] Pager [phone].

## 2014-12-21 NOTE — Progress Notes (Addendum)
Peripherally Inserted Central Catheter/Midline Placement  The IV Nurse has discussed with the patient and/or persons authorized to consent for the patient, the purpose of this procedure and the potential benefits and risks involved with this procedure.  The benefits include less needle sticks, lab draws from the catheter and patient may be discharged home with the catheter.  Risks include, but not limited to, infection, bleeding, blood clot (thrombus formation), and puncture of an artery; nerve damage and irregular heat beat.  Alternatives to this procedure were also discussed.  PICC/Midline Placement Documentation  PICC / Midline Double Lumen 12/21/14 PICC Right Cephalic 41 cm 0 cm (Active)  Indication for Insertion or Continuance of Line Administration of hyperosmolar/irritating solutions (i.e. TPN, Vancomycin, etc.);Home intravenous therapies (PICC only) 12/21/2014  1:00 PM  Exposed Catheter (cm) 0 cm 12/21/2014  1:00 PM  Site Assessment Clean;Dry;Intact 12/21/2014  1:00 PM  Lumen #1 Status Flushed;Blood return noted;Capped (Central line) 12/21/2014  1:00 PM  Lumen #2 Status Flushed;Capped (Central line);Blood return noted 12/21/2014  1:00 PM  Dressing Type Transparent;Securing device 12/21/2014  1:00 PM  Dressing Status Clean;Dry;Intact;Antimicrobial disc in place 12/21/2014  1:00 PM  Line Care Connections checked and tightened 12/21/2014  1:00 PM  Line Adjustment (NICU/IV Team Only) No 12/21/2014  1:00 PM  Dressing Intervention New dressing 12/21/2014  1:00 PM  Dressing Change Due 12/28/14 12/21/2014  1:00 PM    PICC tip verified using ECG technology. PICC ready for use. Spoke with Rodena Piety, NP who spoke with Dr. Maudie Mercury regarding admission of this pt to hospital. Will transfer to ED via stretcher after giving report to ED, RN   Jossie Ng, Essie Hart 12/21/2014, 1:54 PM

## 2014-12-21 NOTE — ED Notes (Signed)
Report given to [facility] RN by Gerda Diss RN. Patient ok to be transported to floor after shift change at [facility] per [employer]. Lawrence RN made aware of left leg wound. Wounds are covered by xeroform and will be redressed after assessed by floor nurse upon arrival. Patient is pain free at this time.

## 2014-12-22 LAB — COMPREHENSIVE METABOLIC PANEL
ALT: 30 U/L (ref 17–63)
AST: 34 U/L (ref 15–41)
Albumin: 2.3 g/dL — ABNORMAL LOW (ref 3.5–5.0)
Alkaline Phosphatase: 80 U/L (ref 38–126)
Anion gap: 9 (ref 5–15)
BUN: 22 mg/dL — ABNORMAL HIGH (ref 6–20)
CALCIUM: 7.4 mg/dL — AB (ref 8.9–10.3)
CO2: 34 mmol/L — AB (ref 22–32)
Chloride: 96 mmol/L — ABNORMAL LOW (ref 101–111)
Creatinine, Ser: 1.1 mg/dL (ref 0.61–1.24)
GFR calc Af Amer: >60 mL/min (ref >60)
GFR calc non Af Amer: >60 mL/min (ref >60)
GLUCOSE: 159 mg/dL — AB (ref 65–99)
Potassium: 3.2 mmol/L — ABNORMAL LOW (ref 3.5–5.1)
Sodium: 139 mmol/L (ref 135–145)
Total Bilirubin: 3 mg/dL — ABNORMAL HIGH (ref 0.3–1.2)
Total Protein: 5.7 g/dL — ABNORMAL LOW (ref 6.5–8.1)

## 2014-12-22 LAB — CBC
HCT: 25.4 % — ABNORMAL LOW (ref 39.0–52.0)
HEMOGLOBIN: 8.2 g/dL — AB (ref 13.0–17.0)
MCH: 33.9 pg (ref 26.0–34.0)
MCHC: 32.3 g/dL (ref 30.0–36.0)
MCV: 105 fL — AB (ref 78.0–100.0)
PLATELETS: 162 10*3/uL (ref 150–400)
RBC: 2.42 MIL/uL — ABNORMAL LOW (ref 4.22–5.81)
RDW: 19 % — AB (ref 11.5–15.5)
WBC: 4.2 10*3/uL (ref 4.0–10.5)

## 2014-12-22 LAB — FERRITIN: FERRITIN: 166 ng/mL (ref 24–336)

## 2014-12-22 LAB — IRON AND TIBC
IRON: 235 ug/dL — AB (ref 45–182)
Saturation Ratios: 77 % — ABNORMAL HIGH (ref 17.9–39.5)
TIBC: 305 ug/dL (ref 250–450)
UIBC: 70 ug/dL

## 2014-12-22 LAB — FOLATE: FOLATE: 10.6 ng/mL (ref >5.9)

## 2014-12-22 LAB — VITAMIN B12: Vitamin B-12: 1683 pg/mL — ABNORMAL HIGH (ref 180–914)

## 2014-12-22 LAB — MRSA PCR SCREENING: MRSA BY PCR: POSITIVE — AB

## 2014-12-22 MED ORDER — CHLORHEXIDINE GLUCONATE CLOTH 2 % EX PADS: 6.0000 | MEDICATED_PAD | Freq: Every day | CUTANEOUS | Status: DC

## 2014-12-22 MED ORDER — MUPIROCIN 2 % EX OINT
1.0000 "application " | TOPICAL_OINTMENT | Freq: Two times a day (BID) | CUTANEOUS | Status: DC
  Administered 2014-12-22 – 2014-12-23 (×2): 1 via NASAL
  Filled 2014-12-22: qty 22

## 2014-12-22 MED ORDER — ENOXAPARIN SODIUM 80 MG/0.8ML ~~LOC~~ SOLN
80.0000 mg | SUBCUTANEOUS | Status: DC
  Administered 2014-12-22: 80 mg via SUBCUTANEOUS
  Filled 2014-12-22: qty 0.8

## 2014-12-22 NOTE — Progress Notes (Signed)
B/P 89/48 - OK to give blood per Dr Shanon Brow.

## 2014-12-22 NOTE — Clinical Social Work Placement (Signed)
   CLINICAL SOCIAL WORK PLACEMENT  NOTE  Date:  12/22/2014  Patient Details  Name: [facility] MRN: [id number] Date of Birth: 1965-09-21  Clinical Social Work is seeking post-discharge placement for this patient at the [place] level of care (*CSW will initial, date and re-position this form in  chart as items are completed):  Yes   Patient/family provided with [place] Work Department's list of facilities offering this level of care within the geographic area requested by the patient (or if unable, by the patient's family).  Yes   Patient/family informed of their freedom to choose among providers that offer the needed level of care, that participate in Medicare, Medicaid or managed care program needed by the patient, have an available bed and are willing to accept the patient.  Yes   Patient/family informed of [facility]'s ownership interest in [facility] and [facility], as well as of the fact that they are under no obligation to receive care at these facilities.  PASRR submitted to EDS on       PASRR number received on       Existing PASRR number confirmed on 12/22/14     FL2 transmitted to all facilities in geographic area requested by pt/family on 12/22/14     FL2 transmitted to all facilities within larger geographic area on       Patient informed that his/her managed care company has contracts with or will negotiate with certain facilities, including the following:            Patient/family informed of bed offers received.  Patient chooses bed at       Physician recommends and patient chooses bed at      Patient to be transferred to   on  .  Patient to be transferred to facility by       Patient family notified on   of transfer.  Name of family member notified:        PHYSICIAN       Additional Comment:    _______________________________________________ Salome Arnt, LCSW 12/22/2014, 10:17  AM [phone]

## 2014-12-22 NOTE — Progress Notes (Signed)
Positive MRSA

## 2014-12-22 NOTE — Progress Notes (Signed)
Subjective: He is here because of anemia. He had a syncopal episode and a comminuted fracture to his left lower leg about 2 weeks ago. He had surgery done at [facility]. He says he's been having syncope and I'm not sure if that was workup at [facility] or not so I will review those records. He says he still has some pain. His pain is fairly well controlled and he is undergoing a blood transfusion now  Objective: Vital signs in last 24 hours: Temp:  [97.5 F (36.4 C)-98.5 F (36.9 C)] 97.6 F (36.4 C) (08/09 0529) Pulse Rate:  [57-65] 60 (08/09 0529) Resp:  [12-22] 18 (08/09 0529) BP: (89-107)/(40-62) 89/40 mmHg (08/09 0529) SpO2:  [88 %-100 %] 95 % (08/09 0736) Weight:  [151.955 kg (335 lb)-154.2 kg (339 lb 15.2 oz)] 154.2 kg (339 lb 15.2 oz) (08/08 1938) Weight change:  Last BM Date: 12/21/14  Intake/Output from previous day: 08/08 0701 - 08/09 0700 In: -  Out: 1500 [Urine:1500]  PHYSICAL EXAM General appearance: alert and moderate distress Resp: clear to auscultation bilaterally Cardio: regular rate and rhythm, S1, S2 normal, no murmur, click, rub or gallop GI: soft, non-tender; bowel sounds normal; no masses,  no organomegaly Extremities: extremities normal, atraumatic, no cyanosis or edema  Lab Results:  Results for orders placed or performed during the hospital encounter of 12/21/14 (from the past 48 hour(s))  Prepare RBC     Status: None   Collection Time: 12/21/14  3:40 PM  Result Value Ref Range   Order Confirmation ORDER PROCESSED BY BLOOD BANK   Type and screen     Status: None (Preliminary result)   Collection Time: 12/21/14  3:40 PM  Result Value Ref Range   ABO/RH(D) A POS    Antibody Screen POS    Sample Expiration 12/24/2014    Antibody Identification ANTI FYA (Duffy a)    DAT, IgG NEG    Unit Number [id number]    Blood Component Type RED CELLS,LR    Unit division 00    Status of Unit ISSUED    Donor AG Type NEGATIVE FOR DUFFY A ANTIGEN    Transfusion  Status OK TO TRANSFUSE    Crossmatch Result COMPATIBLE    Unit Number [id number]    Blood Component Type RED CELLS,LR    Unit division 00    Status of Unit ALLOCATED    Donor AG Type NEGATIVE FOR DUFFY A ANTIGEN    Transfusion Status OK TO TRANSFUSE    Crossmatch Result COMPATIBLE   TSH     Status: None   Collection Time: 12/21/14  3:40 PM  Result Value Ref Range   TSH 1.690 0.350 - 4.500 uIU/mL  ABO/Rh     Status: None   Collection Time: 12/21/14  3:40 PM  Result Value Ref Range   ABO/RH(D) A POS   Comprehensive metabolic panel     Status: Abnormal   Collection Time: 12/21/14  3:41 PM  Result Value Ref Range   Sodium 135 135 - 145 mmol/L   Potassium 3.5 3.5 - 5.1 mmol/L   Chloride 95 (L) 101 - 111 mmol/L   CO2 31 22 - 32 mmol/L   Glucose, Bld 129 (H) 65 - 99 mg/dL   BUN 27 (H) 6 - 20 mg/dL   Creatinine, Ser 1.30 (H) 0.61 - 1.24 mg/dL   Calcium 7.4 (L) 8.9 - 10.3 mg/dL   Total Protein 5.7 (L) 6.5 - 8.1 g/dL   Albumin 2.4 (L) 3.5 - 5.0  g/dL   AST 37 15 - 41 U/L   ALT 32 17 - 63 U/L   Alkaline Phosphatase 84 38 - 126 U/L   Total Bilirubin 2.0 (H) 0.3 - 1.2 mg/dL   GFR calc non Af Amer >60 >60 mL/min   GFR calc Af Amer >60 >60 mL/min    Comment: (NOTE) The eGFR has been calculated using the CKD EPI equation. This calculation has not been validated in all clinical situations. eGFR's persistently <60 mL/min signify possible Chronic Kidney Disease.    Anion gap 9 5 - 15  Lipase, blood     Status: None   Collection Time: 12/21/14  3:41 PM  Result Value Ref Range   Lipase 25 22 - 51 U/L  CBC with Differential/Platelet     Status: Abnormal   Collection Time: 12/21/14  3:41 PM  Result Value Ref Range   WBC 5.5 4.0 - 10.5 K/uL   RBC 2.11 (L) 4.22 - 5.81 MIL/uL   Hemoglobin 7.2 (L) 13.0 - 17.0 g/dL   HCT 22.9 (L) 39.0 - 52.0 %   MCV 108.5 (H) 78.0 - 100.0 fL   MCH 34.1 (H) 26.0 - 34.0 pg   MCHC 31.4 30.0 - 36.0 g/dL   RDW 17.2 (H) 11.5 - 15.5 %   Platelets 180 150 -  400 K/uL   Neutrophils Relative % 73 43 - 77 %   Neutro Abs 4.0 1.7 - 7.7 K/uL   Lymphocytes Relative 16 12 - 46 %   Lymphs Abs 0.9 0.7 - 4.0 K/uL   Monocytes Relative 9 3 - 12 %   Monocytes Absolute 0.5 0.1 - 1.0 K/uL   Eosinophils Relative 2 0 - 5 %   Eosinophils Absolute 0.1 0.0 - 0.7 K/uL   Basophils Relative 0 0 - 1 %   Basophils Absolute 0.0 0.0 - 0.1 K/uL  Reticulocytes     Status: Abnormal   Collection Time: 12/21/14  3:41 PM  Result Value Ref Range   Retic Ct Pct 10.6 (H) 0.4 - 3.1 %   RBC. 2.04 (L) 4.22 - 5.81 MIL/uL   Retic Count, Manual 216.2 (H) 19.0 - 186.0 K/uL  I-Stat CG4 Lactic Acid, ED     Status: None   Collection Time: 12/21/14  3:52 PM  Result Value Ref Range   Lactic Acid, Venous 0.69 0.5 - 2.0 mmol/L  Urinalysis, Routine w reflex microscopic (not at [facility])     Status: Abnormal   Collection Time: 12/21/14  4:18 PM  Result Value Ref Range   Color, Urine AMBER (A) YELLOW    Comment: BIOCHEMICALS MAY BE AFFECTED BY COLOR   APPearance CLEAR CLEAR   Specific Gravity, Urine 1.025 1.005 - 1.030   pH 5.5 5.0 - 8.0   Glucose, UA NEGATIVE NEGATIVE mg/dL   Hgb urine dipstick LARGE (A) NEGATIVE   Bilirubin Urine NEGATIVE NEGATIVE   Ketones, ur NEGATIVE NEGATIVE mg/dL   Protein, ur 30 (A) NEGATIVE mg/dL   Urobilinogen, UA 4.0 (H) 0.0 - 1.0 mg/dL   Nitrite NEGATIVE NEGATIVE   Leukocytes, UA NEGATIVE NEGATIVE  Urine microscopic-add on     Status: Abnormal   Collection Time: 12/21/14  4:18 PM  Result Value Ref Range   Squamous Epithelial / LPF FEW (A) RARE   WBC, UA 11-20 <3 WBC/hpf   RBC / HPF 21-50 <3 RBC/hpf   Bacteria, UA FEW (A) RARE   Casts HYALINE CASTS (A) NEGATIVE   Crystals CA OXALATE CRYSTALS (A) NEGATIVE  MRSA PCR Screening     Status: Abnormal   Collection Time: 12/22/14  1:10 AM  Result Value Ref Range   MRSA by PCR POSITIVE (A) NEGATIVE    Comment:        The GeneXpert MRSA Assay (FDA approved for NASAL specimens only), is one component of  a comprehensive MRSA colonization surveillance program. It is not intended to diagnose MRSA infection nor to guide or monitor treatment for MRSA infections.     ABGS No results for input(s): PHART, PO2ART, TCO2, HCO3 in the last 72 hours.  Invalid input(s): PCO2 CULTURES Recent Results (from the past 240 hour(s))  MRSA PCR Screening     Status: Abnormal   Collection Time: 12/22/14  1:10 AM  Result Value Ref Range Status   MRSA by PCR POSITIVE (A) NEGATIVE Final    Comment:        The GeneXpert MRSA Assay (FDA approved for NASAL specimens only), is one component of a comprehensive MRSA colonization surveillance program. It is not intended to diagnose MRSA infection nor to guide or monitor treatment for MRSA infections.    Studies/Results: Dg Tibia/fibula Left  12/21/2014   CLINICAL DATA:  49 year old male with recent history of fall with multiple fractures in the left leg status post ORIF. Weakness.  EXAM: LEFT TIBIA AND FIBULA - 2 VIEW  COMPARISON:  12/07/2014.  FINDINGS: Again noted is a comminuted fracture of the proximal tibia predominantly involving the metadiaphysis, but with intra-articular extension through the tibial spine. Proximal fibular fracture also noted, but poorly visualized on today's radiographs. Osteochondral injury in the lateral femoral condyle again noted. Compared to the prior study there has been interval ORIF of the proximal tibial fractures, with medial and lateral plate and screw fixation devices in position. Multiple skin staples are noted along the medial aspect of the calf. Soft tissues appear diffusely swollen.  IMPRESSION: 1. Multiple fractures of the left lower extremity redemonstrated, with postoperative changes of ORIF in the left proximal tibia, as above. No acute complicating features.   Electronically Signed   By: Vinnie Langton M.D.   On: 12/21/2014 16:51   Dg Chest Port 1 View  12/21/2014   CLINICAL DATA:  49 year old male with weakness and  history of recent fall a few weeks ago.  EXAM: PORTABLE CHEST - 1 VIEW  COMPARISON:  Prior chest x-ray 12/07/2014  FINDINGS: Stable cardiomegaly. Increased pulmonary vascular congestion now bordering on mild interstitial edema. No large pleural effusion or evidence of pneumothorax. The patient is rotated to the left. Examination is slightly limited by underpenetration. No acute osseous abnormality.  IMPRESSION: Cardiomegaly with pulmonary vascular congestion bordering on mild interstitial edema. Findings suggest early/mild CHF.   Electronically Signed   By: Jacqulynn Cadet M.D.   On: 12/21/2014 16:41    Medications:  Prior to Admission:  Prescriptions prior to admission  Medication Sig Dispense Refill Last Dose  . ARIPiprazole (ABILIFY) 30 MG tablet Take 30 mg by mouth daily.    12/21/2014 at [place]  . calcium citrate (CALCITRATE - DOSED IN MG ELEMENTAL CALCIUM) 950 MG tablet Take 1 tablet by mouth daily.   12/21/2014 at [place]  . cholecalciferol (VITAMIN D) 1000 UNITS tablet Take 1,000 Units by mouth daily.   12/21/2014 at [place]  . citalopram (CELEXA) 40 MG tablet Take 40 mg by mouth at bedtime.    12/21/2014 at [place]  . enoxaparin (LOVENOX) 40 MG/0.4ML injection Inject 40 mg into the skin daily.   12/20/2014 at 1700  .  Fe Bisgly-Succ-C-Thre-B12-FA (IRON-150 PO) Take 1 tablet by mouth daily.   12/21/2014 at 900a  . gabapentin (NEURONTIN) 300 MG capsule Take 300 mg by mouth 3 (three) times daily.   12/21/2014 at 1300  . HYDROmorphone (DILAUDID) 2 MG tablet Take 2 mg by mouth every 4 (four) hours as needed for moderate pain or severe pain.   12/21/2014 at 626a  . ipratropium-albuterol (DUONEB) 0.5-2.5 (3) MG/3ML SOLN Take 3 mLs by nebulization 3 (three) times daily. 360 mL 3 unknown  . lactulose, encephalopathy, (CHRONULAC) 10 GM/15ML SOLN Take 20 g by mouth 3 (three) times daily.   12/21/2014 at 1300  . nadolol (CORGARD) 20 MG tablet Take 20 mg by mouth daily.   12/21/2014 at [place]  . oxyCODONE-acetaminophen  (PERCOCET/ROXICET) 5-325 MG per tablet Take 1 tablet by mouth every 4 (four) hours as needed for moderate pain or severe pain.   12/20/2014 at Unknown time  . OXYGEN Inhale 3 L into the lungs daily.   12/21/2014 at Unknown time  . PROAIR HFA 108 (90 BASE) MCG/ACT inhaler Inhale 2 puffs into the lungs every 6 (six) hours as needed for wheezing or shortness of breath. Shortness of breath/wheeze   unknown  . rifaximin (XIFAXAN) 200 MG tablet Take 200 mg by mouth daily.   12/21/2014 at [place]  . Tiotropium Bromide Monohydrate 2.5 MCG/ACT AERS Inhale 1 capsule into the lungs daily.    12/20/2014 at 1700  . torsemide (DEMADEX) 20 MG tablet Take 40 mg by mouth daily.   12/21/2014 at [place]  . traZODone (DESYREL) 50 MG tablet Take 150 mg by mouth at bedtime.   12/20/2014 at 2100   Scheduled: . ARIPiprazole  30 mg Oral Daily  . calcium citrate  1 tablet Oral Daily  . cholecalciferol  1,000 Units Oral Daily  . citalopram  40 mg Oral QHS  . enoxaparin (LOVENOX) injection  40 mg Subcutaneous Q24H  . gabapentin  300 mg Oral TID  . ipratropium-albuterol  3 mL Nebulization TID  . lactulose  20 g Oral TID  . nadolol  20 mg Oral Daily  . rifaximin  200 mg Oral Daily  . sodium chloride  3 mL Intravenous Q12H  . torsemide  40 mg Oral Daily  . traZODone  150 mg Oral QHS   Continuous: . sodium chloride 50 mL/hr at 12/21/14 2351   DXA:JOINOMVEH, ondansetron **OR** ondansetron (ZOFRAN) IV, oxyCODONE-acetaminophen  Assesment: He was admitted with anemia. Anemia profile is pending. He will have stools for blood. He has multiple other medical problems including chronic pain. Currently his pain is at least fairly well controlled.  He has COPD at baseline and up until recently has still been smoking. He says his breathing is okay now.  He has hypertension which is stable.  He has chronic kidney disease and is being treated with IV fluids and the blood transfusion  He has cirrhosis of the liver which is apparently  stable Active Problems:   Cirrhosis   CKD (chronic kidney disease)   Essential hypertension   Chronic pain   Anemia    Plan: Continue with blood transfusion etc. Recheck hemoglobin after blood transfusion done. Review the records from [facility]    LOS: 1 day   Jorge [name] L 12/22/2014, 8:55 AM

## 2014-12-22 NOTE — Clinical Social Work Note (Signed)
Clinical Social Work Assessment  Patient Details  Name: Jorge [name] MRN: [id number] Date of Birth: [birth year]  Date of referral:  12/22/14               Reason for consult:  Facility Placement                Permission sought to share information with:    Permission granted to share information::     Name::        Agency::     Relationship::     Contact Information:     Housing/Transportation Living arrangements for the past 2 months:  [place] of Information:  Patient, Facility Patient Interpreter Needed:  None Criminal Activity/Legal Involvement Pertinent to Current Situation/Hospitalization:  No - Comment as needed Significant Relationships:  None Lives with:  Facility Resident Do you feel safe going back to the place where you live?  Yes Need for family participation in patient care:  No (Coment)  Care giving concerns:  Pt is resident at [name].   Social Worker assessment / plan:  CSW met with pt at bedside. Pt alert and oriented and well known to CSW. Pt reports he has been a resident at [facility] for about a week after leg fracture. Pt had been at [name] prior to this and plans to return there when able. He has very limited support and primarily stays in touch with [facility]. Pt indicates his therapy has not been going well so far. Pt wants CSW to look for new SNF placement if available. He is willing to go to any other facility in [name]. Per Jorge [name] at facility, okay for return if needed.   Employment status:  Disabled (Comment on whether or not currently receiving Disability) Insurance information:  Medicare PT Recommendations:  Not assessed at this time Information / Referral to community resources:  [place]  Patient/Family's Response to care:  Pt requests new facility as just has not been happy at [facility].    Patient/Family's Understanding of and Emotional Response to Diagnosis, Current Treatment, and Prognosis:  Pt  aware of admission diagnosis. He shared frustration due to fracture and his desire to return to [facility] as soon as possible. Support provided.   Emotional Assessment Appearance:  Appears older than stated age Attitude/Demeanor/Rapport:  Other (Cooperative) Affect (typically observed):  Appropriate Orientation:  Oriented to Self, Oriented to Place, Oriented to  Time, Oriented to Situation Alcohol / Substance use:  Not Applicable Psych involvement (Current and /or in the community):  No (Comment)  Discharge Needs  Concerns to be addressed:  Discharge Planning Concerns Readmission within the last 30 days:  Yes Current discharge risk:  Chronically ill Barriers to Discharge:  Continued Medical Work up   Jorge [name], Jorge [name] 12/22/2014, 9:57 AM [phone]

## 2014-12-22 NOTE — Care Management Note (Signed)
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 Anticipate pt will return to facility at discharge.  Action/Plan: CSW is aware and will arrange discharge to facility when medically stable.  Expected Discharge Date:                  Expected Discharge Plan:  Skilled Nursing Facility  In-House Referral:  Clinical Social Work  Discharge planning Services  CM Consult  Post Acute Care Choice:  NA Choice offered to:  NA  DME Arranged:    DME Agency:     HH Arranged:    [place] Agency:     Status of Service:  Completed, signed off  Medicare Important Message Given:    Date Medicare IM Given:    Medicare IM give by:    Date Additional Medicare IM Given:    Additional Medicare Important Message give by:     If discussed at [place] of Stay Meetings, dates discussed:    Additional Comments:  Joylene Draft, RN 12/22/2014, 2:17 PM

## 2014-12-23 DIAGNOSIS — B998 Other infectious disease: Secondary | ICD-10-CM | POA: Diagnosis not present

## 2014-12-23 DIAGNOSIS — D539 Nutritional anemia, unspecified: Secondary | ICD-10-CM | POA: Diagnosis not present

## 2014-12-23 DIAGNOSIS — M6281 Muscle weakness (generalized): Secondary | ICD-10-CM | POA: Diagnosis not present

## 2014-12-23 DIAGNOSIS — L8990 Pressure ulcer of unspecified site, unspecified stage: Secondary | ICD-10-CM | POA: Diagnosis not present

## 2014-12-23 DIAGNOSIS — Z7401 Bed confinement status: Secondary | ICD-10-CM | POA: Diagnosis not present

## 2014-12-23 DIAGNOSIS — Z9981 Dependence on supplemental oxygen: Secondary | ICD-10-CM | POA: Diagnosis not present

## 2014-12-23 DIAGNOSIS — T79A22D Traumatic compartment syndrome of left lower extremity, subsequent encounter: Secondary | ICD-10-CM | POA: Diagnosis not present

## 2014-12-23 DIAGNOSIS — R001 Bradycardia, unspecified: Secondary | ICD-10-CM | POA: Diagnosis not present

## 2014-12-23 DIAGNOSIS — S82202A Unspecified fracture of shaft of left tibia, initial encounter for closed fracture: Secondary | ICD-10-CM | POA: Diagnosis not present

## 2014-12-23 DIAGNOSIS — G4733 Obstructive sleep apnea (adult) (pediatric): Secondary | ICD-10-CM | POA: Diagnosis not present

## 2014-12-23 DIAGNOSIS — R55 Syncope and collapse: Secondary | ICD-10-CM | POA: Diagnosis not present

## 2014-12-23 DIAGNOSIS — Z9889 Other specified postprocedural states: Secondary | ICD-10-CM | POA: Diagnosis not present

## 2014-12-23 DIAGNOSIS — J449 Chronic obstructive pulmonary disease, unspecified: Secondary | ICD-10-CM | POA: Diagnosis not present

## 2014-12-23 DIAGNOSIS — R609 Edema, unspecified: Secondary | ICD-10-CM | POA: Diagnosis not present

## 2014-12-23 DIAGNOSIS — G4700 Insomnia, unspecified: Secondary | ICD-10-CM | POA: Diagnosis not present

## 2014-12-23 DIAGNOSIS — L98419 Non-pressure chronic ulcer of buttock with unspecified severity: Secondary | ICD-10-CM | POA: Diagnosis not present

## 2014-12-23 DIAGNOSIS — R5381 Other malaise: Secondary | ICD-10-CM | POA: Diagnosis not present

## 2014-12-23 DIAGNOSIS — J441 Chronic obstructive pulmonary disease with (acute) exacerbation: Secondary | ICD-10-CM | POA: Diagnosis not present

## 2014-12-23 DIAGNOSIS — S82142D Displaced bicondylar fracture of left tibia, subsequent encounter for closed fracture with routine healing: Secondary | ICD-10-CM | POA: Diagnosis not present

## 2014-12-23 DIAGNOSIS — D500 Iron deficiency anemia secondary to blood loss (chronic): Secondary | ICD-10-CM | POA: Diagnosis not present

## 2014-12-23 DIAGNOSIS — Z79899 Other long term (current) drug therapy: Secondary | ICD-10-CM | POA: Diagnosis not present

## 2014-12-23 DIAGNOSIS — R6889 Other general symptoms and signs: Secondary | ICD-10-CM | POA: Diagnosis not present

## 2014-12-23 DIAGNOSIS — Z9181 History of falling: Secondary | ICD-10-CM | POA: Diagnosis not present

## 2014-12-23 DIAGNOSIS — R197 Diarrhea, unspecified: Secondary | ICD-10-CM | POA: Diagnosis not present

## 2014-12-23 DIAGNOSIS — J9611 Chronic respiratory failure with hypoxia: Secondary | ICD-10-CM | POA: Diagnosis not present

## 2014-12-23 DIAGNOSIS — R262 Difficulty in walking, not elsewhere classified: Secondary | ICD-10-CM | POA: Diagnosis not present

## 2014-12-23 DIAGNOSIS — X58XXXD Exposure to other specified factors, subsequent encounter: Secondary | ICD-10-CM | POA: Diagnosis not present

## 2014-12-23 DIAGNOSIS — Z743 Need for continuous supervision: Secondary | ICD-10-CM | POA: Diagnosis not present

## 2014-12-23 DIAGNOSIS — R062 Wheezing: Secondary | ICD-10-CM | POA: Diagnosis not present

## 2014-12-23 DIAGNOSIS — R700 Elevated erythrocyte sedimentation rate: Secondary | ICD-10-CM | POA: Diagnosis not present

## 2014-12-23 DIAGNOSIS — G8929 Other chronic pain: Secondary | ICD-10-CM | POA: Diagnosis not present

## 2014-12-23 DIAGNOSIS — Z7901 Long term (current) use of anticoagulants: Secondary | ICD-10-CM | POA: Diagnosis not present

## 2014-12-23 DIAGNOSIS — R2681 Unsteadiness on feet: Secondary | ICD-10-CM | POA: Diagnosis not present

## 2014-12-23 DIAGNOSIS — A4902 Methicillin resistant Staphylococcus aureus infection, unspecified site: Secondary | ICD-10-CM | POA: Diagnosis not present

## 2014-12-23 DIAGNOSIS — R05 Cough: Secondary | ICD-10-CM | POA: Diagnosis not present

## 2014-12-23 DIAGNOSIS — M1711 Unilateral primary osteoarthritis, right knee: Secondary | ICD-10-CM | POA: Diagnosis not present

## 2014-12-23 DIAGNOSIS — M879 Osteonecrosis, unspecified: Secondary | ICD-10-CM | POA: Diagnosis not present

## 2014-12-23 DIAGNOSIS — I129 Hypertensive chronic kidney disease with stage 1 through stage 4 chronic kidney disease, or unspecified chronic kidney disease: Secondary | ICD-10-CM | POA: Diagnosis not present

## 2014-12-23 DIAGNOSIS — D62 Acute posthemorrhagic anemia: Secondary | ICD-10-CM | POA: Diagnosis not present

## 2014-12-23 DIAGNOSIS — F339 Major depressive disorder, recurrent, unspecified: Secondary | ICD-10-CM | POA: Diagnosis not present

## 2014-12-23 DIAGNOSIS — S82192D Other fracture of upper end of left tibia, subsequent encounter for closed fracture with routine healing: Secondary | ICD-10-CM | POA: Diagnosis not present

## 2014-12-23 DIAGNOSIS — K219 Gastro-esophageal reflux disease without esophagitis: Secondary | ICD-10-CM | POA: Diagnosis not present

## 2014-12-23 DIAGNOSIS — E46 Unspecified protein-calorie malnutrition: Secondary | ICD-10-CM | POA: Diagnosis not present

## 2014-12-23 DIAGNOSIS — M86172 Other acute osteomyelitis, left ankle and foot: Secondary | ICD-10-CM | POA: Diagnosis not present

## 2014-12-23 DIAGNOSIS — T79A22S Traumatic compartment syndrome of left lower extremity, sequela: Secondary | ICD-10-CM | POA: Diagnosis not present

## 2014-12-23 DIAGNOSIS — M79672 Pain in left foot: Secondary | ICD-10-CM | POA: Diagnosis not present

## 2014-12-23 DIAGNOSIS — A499 Bacterial infection, unspecified: Secondary | ICD-10-CM | POA: Diagnosis not present

## 2014-12-23 DIAGNOSIS — E785 Hyperlipidemia, unspecified: Secondary | ICD-10-CM | POA: Diagnosis not present

## 2014-12-23 DIAGNOSIS — R718 Other abnormality of red blood cells: Secondary | ICD-10-CM | POA: Diagnosis not present

## 2014-12-23 DIAGNOSIS — Z880 Allergy status to penicillin: Secondary | ICD-10-CM | POA: Diagnosis not present

## 2014-12-23 DIAGNOSIS — F319 Bipolar disorder, unspecified: Secondary | ICD-10-CM | POA: Diagnosis not present

## 2014-12-23 DIAGNOSIS — I872 Venous insufficiency (chronic) (peripheral): Secondary | ICD-10-CM | POA: Diagnosis not present

## 2014-12-23 DIAGNOSIS — M25561 Pain in right knee: Secondary | ICD-10-CM | POA: Diagnosis not present

## 2014-12-23 DIAGNOSIS — M79602 Pain in left arm: Secondary | ICD-10-CM | POA: Diagnosis not present

## 2014-12-23 DIAGNOSIS — K7030 Alcoholic cirrhosis of liver without ascites: Secondary | ICD-10-CM | POA: Diagnosis not present

## 2014-12-23 DIAGNOSIS — M810 Age-related osteoporosis without current pathological fracture: Secondary | ICD-10-CM | POA: Diagnosis not present

## 2014-12-23 DIAGNOSIS — S72002A Fracture of unspecified part of neck of left femur, initial encounter for closed fracture: Secondary | ICD-10-CM | POA: Diagnosis not present

## 2014-12-23 DIAGNOSIS — D696 Thrombocytopenia, unspecified: Secondary | ICD-10-CM | POA: Diagnosis not present

## 2014-12-23 DIAGNOSIS — D649 Anemia, unspecified: Secondary | ICD-10-CM | POA: Diagnosis not present

## 2014-12-23 DIAGNOSIS — K5900 Constipation, unspecified: Secondary | ICD-10-CM | POA: Diagnosis not present

## 2014-12-23 DIAGNOSIS — E119 Type 2 diabetes mellitus without complications: Secondary | ICD-10-CM | POA: Diagnosis not present

## 2014-12-23 DIAGNOSIS — R600 Localized edema: Secondary | ICD-10-CM | POA: Diagnosis not present

## 2014-12-23 DIAGNOSIS — L7682 Other postprocedural complications of skin and subcutaneous tissue: Secondary | ICD-10-CM | POA: Diagnosis not present

## 2014-12-23 DIAGNOSIS — M7989 Other specified soft tissue disorders: Secondary | ICD-10-CM | POA: Diagnosis not present

## 2014-12-23 DIAGNOSIS — R2230 Localized swelling, mass and lump, unspecified upper limb: Secondary | ICD-10-CM | POA: Diagnosis not present

## 2014-12-23 DIAGNOSIS — K7460 Unspecified cirrhosis of liver: Secondary | ICD-10-CM | POA: Diagnosis not present

## 2014-12-23 DIAGNOSIS — M25572 Pain in left ankle and joints of left foot: Secondary | ICD-10-CM | POA: Diagnosis not present

## 2014-12-23 DIAGNOSIS — S82832D Other fracture of upper and lower end of left fibula, subsequent encounter for closed fracture with routine healing: Secondary | ICD-10-CM | POA: Diagnosis not present

## 2014-12-23 DIAGNOSIS — R279 Unspecified lack of coordination: Secondary | ICD-10-CM | POA: Diagnosis not present

## 2014-12-23 DIAGNOSIS — E860 Dehydration: Secondary | ICD-10-CM | POA: Diagnosis not present

## 2014-12-23 DIAGNOSIS — E1122 Type 2 diabetes mellitus with diabetic chronic kidney disease: Secondary | ICD-10-CM | POA: Diagnosis not present

## 2014-12-23 DIAGNOSIS — K9189 Other postprocedural complications and disorders of digestive system: Secondary | ICD-10-CM | POA: Diagnosis not present

## 2014-12-23 DIAGNOSIS — N189 Chronic kidney disease, unspecified: Secondary | ICD-10-CM | POA: Diagnosis not present

## 2014-12-23 DIAGNOSIS — I10 Essential (primary) hypertension: Secondary | ICD-10-CM | POA: Diagnosis not present

## 2014-12-23 DIAGNOSIS — Z87891 Personal history of nicotine dependence: Secondary | ICD-10-CM | POA: Diagnosis not present

## 2014-12-23 DIAGNOSIS — R0789 Other chest pain: Secondary | ICD-10-CM | POA: Diagnosis not present

## 2014-12-23 DIAGNOSIS — N183 Chronic kidney disease, stage 3 (moderate): Secondary | ICD-10-CM | POA: Diagnosis not present

## 2014-12-23 DIAGNOSIS — S86992A Other injury of unspecified muscle(s) and tendon(s) at lower leg level, left leg, initial encounter: Secondary | ICD-10-CM | POA: Diagnosis not present

## 2014-12-23 DIAGNOSIS — M869 Osteomyelitis, unspecified: Secondary | ICD-10-CM | POA: Diagnosis not present

## 2014-12-23 LAB — URINE CULTURE: Culture: 5000

## 2014-12-23 LAB — BASIC METABOLIC PANEL
ANION GAP: 11 (ref 5–15)
BUN: 18 mg/dL (ref 6–20)
CHLORIDE: 95 mmol/L — AB (ref 101–111)
CO2: 32 mmol/L (ref 22–32)
Calcium: 7.5 mg/dL — ABNORMAL LOW (ref 8.9–10.3)
Creatinine, Ser: 1.15 mg/dL (ref 0.61–1.24)
GFR calc non Af Amer: >60 mL/min (ref >60)
GLUCOSE: 192 mg/dL — AB (ref 65–99)
Potassium: 3.5 mmol/L (ref 3.5–5.1)
Sodium: 138 mmol/L (ref 135–145)

## 2014-12-23 LAB — CBC WITH DIFFERENTIAL/PLATELET
Basophils Absolute: 0 10*3/uL (ref 0.0–0.1)
Basophils Relative: 0 % (ref 0–1)
Eosinophils Absolute: 0.1 10*3/uL (ref 0.0–0.7)
Eosinophils Relative: 2 % (ref 0–5)
HEMATOCRIT: 25.7 % — AB (ref 39.0–52.0)
HEMOGLOBIN: 8.2 g/dL — AB (ref 13.0–17.0)
LYMPHS ABS: 0.7 10*3/uL (ref 0.7–4.0)
Lymphocytes Relative: 16 % (ref 12–46)
MCH: 33.7 pg (ref 26.0–34.0)
MCHC: 31.9 g/dL (ref 30.0–36.0)
MCV: 105.8 fL — AB (ref 78.0–100.0)
MONO ABS: 0.3 10*3/uL (ref 0.1–1.0)
Monocytes Relative: 7 % (ref 3–12)
NEUTROS ABS: 3.5 10*3/uL (ref 1.7–7.7)
Neutrophils Relative %: 75 % (ref 43–77)
Platelets: 162 10*3/uL (ref 150–400)
RBC: 2.43 MIL/uL — ABNORMAL LOW (ref 4.22–5.81)
RDW: 19.1 % — AB (ref 11.5–15.5)
WBC: 4.7 10*3/uL (ref 4.0–10.5)

## 2014-12-23 MED ORDER — HEPARIN SOD (PORK) LOCK FLUSH 100 UNIT/ML IV SOLN
250.0000 [IU] | INTRAVENOUS | Status: AC | PRN
  Administered 2014-12-23: 250 [IU]
  Filled 2014-12-23: qty 5

## 2014-12-23 MED ORDER — ENOXAPARIN SODIUM 80 MG/0.8ML ~~LOC~~ SOLN: 80.0000 mg | SUBCUTANEOUS | Status: DC

## 2014-12-23 MED ORDER — VANCOMYCIN HCL 10 G IV SOLR
2500.0000 mg | Freq: Once | INTRAVENOUS | Status: AC
  Administered 2014-12-23: 2500 mg via INTRAVENOUS
  Filled 2014-12-23: qty 2500

## 2014-12-23 MED ORDER — MUPIROCIN 2 % EX OINT: 1.0000 "application " | TOPICAL_OINTMENT | Freq: Two times a day (BID) | CUTANEOUS | Status: DC

## 2014-12-23 MED ORDER — HYDROMORPHONE HCL 2 MG PO TABS
2.0000 mg | ORAL_TABLET | ORAL | Status: DC | PRN
  Administered 2014-12-23 (×2): 2 mg via ORAL
  Filled 2014-12-23 (×2): qty 1

## 2014-12-23 MED ORDER — HEPARIN SOD (PORK) LOCK FLUSH 100 UNIT/ML IV SOLN
250.0000 [IU] | INTRAVENOUS | Status: AC | PRN
Start: 2014-12-23 — End: 2014-12-23
  Administered 2014-12-23: 250 [IU]

## 2014-12-23 MED ORDER — VANCOMYCIN HCL 10 G IV SOLR
1500.0000 mg | Freq: Two times a day (BID) | INTRAVENOUS | Status: DC
  Filled 2014-12-23 (×2): qty 1500

## 2014-12-23 NOTE — Progress Notes (Signed)
[facility] called and reported that wound cultures were positive for MRSA, notified Dr Luan Pulling at this time.

## 2014-12-23 NOTE — Clinical Social Work Note (Signed)
CSW facilitated discharge.  CSW notified facility of patient's discharge today.  CSW called [facility] and received no answer. The voicemail box was full thus preventing CSW from leaving a message regarding discharge.  CSW called the number listed for patient's brother, Trajan [place], and was advised that Rodman Key Kopinski has not worked at [employer] in over two years.   CSW faxed clinicals to facility and arranged transportation.  CSW signing off.   Ihor Gully, [place]

## 2014-12-23 NOTE — Progress Notes (Addendum)
Report called to Dina at [facility].  Double lumen PICC heparin flushed/locked. Will continue to monitor pt until leaves the floor.

## 2014-12-23 NOTE — Clinical Social Work Note (Signed)
Edwena Felty from [facility] came and asked to see patient. CSW called patient's room and inquired as to whether he wanted to have a visit form [place]. Patient was agreeable for to the visit. Edwena Felty returned to [place] after visit and indicated that patient wanted to speak with CSW.  CSW met with patient who indicated that he wanted to return to [facility]. CSW inquired as to what made patient change his mind.  Patient indicated that it was closer to his family who lived in the [facility] area (patient had indicated this morning that he did not have family). CSW stressed the importance of patient making his own decision that he felt comfortable with.  Patient again stated that he wanted to go back to [facility].  CSW notified Claiborne Billings at [facility] that patient was going to return to [facility].  CSW notified Debbie at [facility] of patient's discharge. CSW faxed clinicals to [facility].  Ihor Gully, [place]

## 2014-12-23 NOTE — Consult Note (Signed)
WOC wound consult note Reason for Consult: Wounds on left LE resulting from fall and subsequent fracture(s).  Is s/p skin graft to distal wound, proximal wound is closely approximated with sutures. Patient's feet with long and curved toenails.  Will benefit from podiatry consult as well as follow up from Plastic Surgeon performing graft procedure at [facility] in [place]. Patient is not conversive and is a poor historian regarding this event or the subsequent care her received at [facility]. Wound type:traumatic and surgical Pressure Ulcer POA: No Measurement: Distal wound with skin graft measures 13cm x 6.5cm x 0.4cm and presents with 80% "take" of graft and 20% red tissue at periphery of wound.  Proximal wound is closely approximated with sutures and measures 6cm. Wound bed:As described above. Drainage (amount, consistency, odor) Scant light yellow drainage from distal wound (graft). No odor. Periwound: Ecchymotic (resolving), edematous. Dressing procedure/placement/frequency: I will implement conservative orders for the care of both left LE wounds; cleansing with saline and applying a betadine swabstick to the approximated incision and covering the graft with xeroform gauze to ensure antimicrobial and therapeutically moist environment as well as padding and protection from injury and infection. Thank you for this consult.  The [place] nursing team will not follow, but will remain available to this patient, the nursing and medical teams.  Please re-consult if needed. Thanks, Maudie Flakes, MSN, RN, [place], [place], [place] [phone])

## 2014-12-23 NOTE — Care Management Important Message (Signed)
Important Message  Patient Details  Name: Jorge [name] MRN: [id number] Date of Birth: 06-03-1965   Medicare Important Message Given:  N/A - LOS <3 / Initial given by admissions    Joylene Draft, RN 12/23/2014, 1:06 PM

## 2014-12-23 NOTE — Progress Notes (Addendum)
Subjective: He says he still having significant pain. He has had a blood transfusion but hemoglobin levels pending this morning. His anemia profile did not show evidence of iron deficiency anemia folic acid deficiency or vitamin B-12 deficiency. when he left [facility] his hemoglobin level was around 8-1/2. He did have echocardiogram while he was there but I can't find results for cardiac monitoring etc.  Objective: Vital signs in last 24 hours: Temp:  [97.6 F (36.4 C)-98.1 F (36.7 C)] 98 F (36.7 C) (08/10 0659) Pulse Rate:  [60-72] 60 (08/10 0659) Resp:  [18-20] 20 (08/10 0659) BP: (97-115)/(51-59) 113/56 mmHg (08/10 0659) SpO2:  [95 %-98 %] 97 % (08/10 0729) Weight change:  Last BM Date: 12/22/14  Intake/Output from previous day: 08/09 0701 - 08/10 0700 In: 815 [P.O.:480; Blood:335] Out: 3100 [Urine:3100]  PHYSICAL EXAM General appearance: alert, cooperative, mild distress and morbidly obese Resp: clear to auscultation bilaterally Cardio: regular rate and rhythm, S1, S2 normal, no murmur, click, rub or gallop GI: soft, non-tender; bowel sounds normal; no masses,  no organomegaly Extremities: His surgical wound looks okay.  Lab Results:  Results for orders placed or performed during the hospital encounter of 12/21/14 (from the past 48 hour(s))  Prepare RBC     Status: None   Collection Time: 12/21/14  3:40 PM  Result Value Ref Range   Order Confirmation ORDER PROCESSED BY BLOOD BANK   Type and screen     Status: None (Preliminary result)   Collection Time: 12/21/14  3:40 PM  Result Value Ref Range   ABO/RH(D) A POS    Antibody Screen POS    Sample Expiration 12/24/2014    Antibody Identification ANTI FYA (Duffy a)    DAT, IgG NEG    PT AG Type      NEGATIVE FOR DUFFY A ANTIGEN Performed at [facility]    Unit Number [id number]    Blood Component Type RED CELLS,LR    Unit division 00    Status of Unit ISSUED    Donor AG Type NEGATIVE FOR DUFFY A ANTIGEN     Transfusion Status OK TO TRANSFUSE    Crossmatch Result COMPATIBLE    Unit Number [id number]    Blood Component Type RED CELLS,LR    Unit division 00    Status of Unit ALLOCATED    Donor AG Type NEGATIVE FOR DUFFY A ANTIGEN    Transfusion Status OK TO TRANSFUSE    Crossmatch Result COMPATIBLE   TSH     Status: None   Collection Time: 12/21/14  3:40 PM  Result Value Ref Range   TSH 1.690 0.350 - 4.500 uIU/mL  ABO/Rh     Status: None   Collection Time: 12/21/14  3:40 PM  Result Value Ref Range   ABO/RH(D) A POS   Comprehensive metabolic panel     Status: Abnormal   Collection Time: 12/21/14  3:41 PM  Result Value Ref Range   Sodium 135 135 - 145 mmol/L   Potassium 3.5 3.5 - 5.1 mmol/L   Chloride 95 (L) 101 - 111 mmol/L   CO2 31 22 - 32 mmol/L   Glucose, Bld 129 (H) 65 - 99 mg/dL   BUN 27 (H) 6 - 20 mg/dL   Creatinine, Ser 1.30 (H) 0.61 - 1.24 mg/dL   Calcium 7.4 (L) 8.9 - 10.3 mg/dL   Total Protein 5.7 (L) 6.5 - 8.1 g/dL   Albumin 2.4 (L) 3.5 - 5.0 g/dL   AST 37 15 -  41 U/L   ALT 32 17 - 63 U/L   Alkaline Phosphatase 84 38 - 126 U/L   Total Bilirubin 2.0 (H) 0.3 - 1.2 mg/dL   GFR calc non Af Amer >60 >60 mL/min   GFR calc Af Amer >60 >60 mL/min    Comment: (NOTE) The eGFR has been calculated using the CKD EPI equation. This calculation has not been validated in all clinical situations. eGFR's persistently <60 mL/min signify possible Chronic Kidney Disease.    Anion gap 9 5 - 15  Lipase, blood     Status: None   Collection Time: 12/21/14  3:41 PM  Result Value Ref Range   Lipase 25 22 - 51 U/L  CBC with Differential/Platelet     Status: Abnormal   Collection Time: 12/21/14  3:41 PM  Result Value Ref Range   WBC 5.5 4.0 - 10.5 K/uL   RBC 2.11 (L) 4.22 - 5.81 MIL/uL   Hemoglobin 7.2 (L) 13.0 - 17.0 g/dL   HCT 22.9 (L) 39.0 - 52.0 %   MCV 108.5 (H) 78.0 - 100.0 fL   MCH 34.1 (H) 26.0 - 34.0 pg   MCHC 31.4 30.0 - 36.0 g/dL   RDW 17.2 (H) 11.5 - 15.5 %    Platelets 180 150 - 400 K/uL   Neutrophils Relative % 73 43 - 77 %   Neutro Abs 4.0 1.7 - 7.7 K/uL   Lymphocytes Relative 16 12 - 46 %   Lymphs Abs 0.9 0.7 - 4.0 K/uL   Monocytes Relative 9 3 - 12 %   Monocytes Absolute 0.5 0.1 - 1.0 K/uL   Eosinophils Relative 2 0 - 5 %   Eosinophils Absolute 0.1 0.0 - 0.7 K/uL   Basophils Relative 0 0 - 1 %   Basophils Absolute 0.0 0.0 - 0.1 K/uL  Culture, blood (routine x 2)     Status: None (Preliminary result)   Collection Time: 12/21/14  3:41 PM  Result Value Ref Range   Specimen Description BLOOD PICC LINE DRAWN BY RN S.H.    Special Requests BOTTLES DRAWN AEROBIC AND ANAEROBIC 6CC    Culture NO GROWTH < 24 HOURS    Report Status PENDING   Reticulocytes     Status: Abnormal   Collection Time: 12/21/14  3:41 PM  Result Value Ref Range   Retic Ct Pct 10.6 (H) 0.4 - 3.1 %   RBC. 2.04 (L) 4.22 - 5.81 MIL/uL   Retic Count, Manual 216.2 (H) 19.0 - 186.0 K/uL  Culture, blood (routine x 2)     Status: None (Preliminary result)   Collection Time: 12/21/14  3:48 PM  Result Value Ref Range   Specimen Description BLOOD LEFT HAND    Special Requests BOTTLES DRAWN AEROBIC AND ANAEROBIC 6CC    Culture NO GROWTH < 24 HOURS    Report Status PENDING   I-Stat CG4 Lactic Acid, ED     Status: None   Collection Time: 12/21/14  3:52 PM  Result Value Ref Range   Lactic Acid, Venous 0.69 0.5 - 2.0 mmol/L  Urinalysis, Routine w reflex microscopic (not at [facility])     Status: Abnormal   Collection Time: 12/21/14  4:18 PM  Result Value Ref Range   Color, Urine AMBER (A) YELLOW    Comment: BIOCHEMICALS MAY BE AFFECTED BY COLOR   APPearance CLEAR CLEAR   Specific Gravity, Urine 1.025 1.005 - 1.030   pH 5.5 5.0 - 8.0   Glucose, UA NEGATIVE NEGATIVE mg/dL  Hgb urine dipstick LARGE (A) NEGATIVE   Bilirubin Urine NEGATIVE NEGATIVE   Ketones, ur NEGATIVE NEGATIVE mg/dL   Protein, ur 30 (A) NEGATIVE mg/dL   Urobilinogen, UA 4.0 (H) 0.0 - 1.0 mg/dL   Nitrite  NEGATIVE NEGATIVE   Leukocytes, UA NEGATIVE NEGATIVE  Urine culture     Status: None (Preliminary result)   Collection Time: 12/21/14  4:18 PM  Result Value Ref Range   Specimen Description URINE, CATHETERIZED    Special Requests NONE    Culture      NO GROWTH < 24 HOURS Performed at [facility]    Report Status PENDING   Urine microscopic-add on     Status: Abnormal   Collection Time: 12/21/14  4:18 PM  Result Value Ref Range   Squamous Epithelial / LPF FEW (A) RARE   WBC, UA 11-20 <3 WBC/hpf   RBC / HPF 21-50 <3 RBC/hpf   Bacteria, UA FEW (A) RARE   Casts HYALINE CASTS (A) NEGATIVE   Crystals CA OXALATE CRYSTALS (A) NEGATIVE  MRSA PCR Screening     Status: Abnormal   Collection Time: 12/22/14  1:10 AM  Result Value Ref Range   MRSA by PCR POSITIVE (A) NEGATIVE    Comment:        The GeneXpert MRSA Assay (FDA approved for NASAL specimens only), is one component of a comprehensive MRSA colonization surveillance program. It is not intended to diagnose MRSA infection nor to guide or monitor treatment for MRSA infections.   Comprehensive metabolic panel     Status: Abnormal   Collection Time: 12/22/14 11:40 AM  Result Value Ref Range   Sodium 139 135 - 145 mmol/L   Potassium 3.2 (L) 3.5 - 5.1 mmol/L   Chloride 96 (L) 101 - 111 mmol/L   CO2 34 (H) 22 - 32 mmol/L   Glucose, Bld 159 (H) 65 - 99 mg/dL   BUN 22 (H) 6 - 20 mg/dL   Creatinine, Ser 1.10 0.61 - 1.24 mg/dL   Calcium 7.4 (L) 8.9 - 10.3 mg/dL   Total Protein 5.7 (L) 6.5 - 8.1 g/dL   Albumin 2.3 (L) 3.5 - 5.0 g/dL   AST 34 15 - 41 U/L   ALT 30 17 - 63 U/L   Alkaline Phosphatase 80 38 - 126 U/L   Total Bilirubin 3.0 (H) 0.3 - 1.2 mg/dL   GFR calc non Af Amer >60 >60 mL/min   GFR calc Af Amer >60 >60 mL/min    Comment: (NOTE) The eGFR has been calculated using the CKD EPI equation. This calculation has not been validated in all clinical situations. eGFR's persistently <60 mL/min signify possible  Chronic Kidney Disease.    Anion gap 9 5 - 15  CBC     Status: Abnormal   Collection Time: 12/22/14 11:40 AM  Result Value Ref Range   WBC 4.2 4.0 - 10.5 K/uL   RBC 2.42 (L) 4.22 - 5.81 MIL/uL   Hemoglobin 8.2 (L) 13.0 - 17.0 g/dL   HCT 25.4 (L) 39.0 - 52.0 %   MCV 105.0 (H) 78.0 - 100.0 fL   MCH 33.9 26.0 - 34.0 pg   MCHC 32.3 30.0 - 36.0 g/dL   RDW 19.0 (H) 11.5 - 15.5 %   Platelets 162 150 - 400 K/uL  Vitamin B12     Status: Abnormal   Collection Time: 12/22/14 11:40 AM  Result Value Ref Range   Vitamin B-12 1683 (H) 180 - 914 pg/mL  Comment: (NOTE) This assay is not validated for testing neonatal or myeloproliferative syndrome specimens for Vitamin B12 levels. Performed at [facility]   Folate     Status: None   Collection Time: 12/22/14 11:40 AM  Result Value Ref Range   Folate 10.6 >5.9 ng/mL    Comment: Performed at [facility]  Iron and TIBC     Status: Abnormal   Collection Time: 12/22/14 11:40 AM  Result Value Ref Range   Iron 235 (H) 45 - 182 ug/dL   TIBC 305 250 - 450 ug/dL   Saturation Ratios 77 (H) 17.9 - 39.5 %   UIBC 70 ug/dL    Comment: Performed at [facility]  Ferritin     Status: None   Collection Time: 12/22/14 11:40 AM  Result Value Ref Range   Ferritin 166 24 - 336 ng/mL    Comment: Performed at [facility]    ABGS No results for input(s): PHART, PO2ART, TCO2, HCO3 in the last 72 hours.  Invalid input(s): PCO2 CULTURES Recent Results (from the past 240 hour(s))  Culture, blood (routine x 2)     Status: None (Preliminary result)   Collection Time: 12/21/14  3:41 PM  Result Value Ref Range Status   Specimen Description BLOOD PICC LINE DRAWN BY RN S.H.  Final   Special Requests BOTTLES DRAWN AEROBIC AND ANAEROBIC 6CC  Final   Culture NO GROWTH < 24 HOURS  Final   Report Status PENDING  Incomplete  Culture, blood (routine x 2)     Status: None (Preliminary result)   Collection Time: 12/21/14  3:48 PM   Result Value Ref Range Status   Specimen Description BLOOD LEFT HAND  Final   Special Requests BOTTLES DRAWN AEROBIC AND ANAEROBIC 6CC  Final   Culture NO GROWTH < 24 HOURS  Final   Report Status PENDING  Incomplete  Urine culture     Status: None (Preliminary result)   Collection Time: 12/21/14  4:18 PM  Result Value Ref Range Status   Specimen Description URINE, CATHETERIZED  Final   Special Requests NONE  Final   Culture   Final    NO GROWTH < 24 HOURS Performed at [facility]    Report Status PENDING  Incomplete  MRSA PCR Screening     Status: Abnormal   Collection Time: 12/22/14  1:10 AM  Result Value Ref Range Status   MRSA by PCR POSITIVE (A) NEGATIVE Final    Comment:        The GeneXpert MRSA Assay (FDA approved for NASAL specimens only), is one component of a comprehensive MRSA colonization surveillance program. It is not intended to diagnose MRSA infection nor to guide or monitor treatment for MRSA infections.    Studies/Results: Dg Tibia/fibula Left  12/21/2014   CLINICAL DATA:  49 year old male with recent history of fall with multiple fractures in the left leg status post ORIF. Weakness.  EXAM: LEFT TIBIA AND FIBULA - 2 VIEW  COMPARISON:  12/07/2014.  FINDINGS: Again noted is a comminuted fracture of the proximal tibia predominantly involving the metadiaphysis, but with intra-articular extension through the tibial spine. Proximal fibular fracture also noted, but poorly visualized on today's radiographs. Osteochondral injury in the lateral femoral condyle again noted. Compared to the prior study there has been interval ORIF of the proximal tibial fractures, with medial and lateral plate and screw fixation devices in position. Multiple skin staples are noted along the medial aspect of the calf.  Soft tissues appear diffusely swollen.  IMPRESSION: 1. Multiple fractures of the left lower extremity redemonstrated, with postoperative changes of ORIF in the left  proximal tibia, as above. No acute complicating features.   Electronically Signed   By: Vinnie Langton M.D.   On: 12/21/2014 16:51   Dg Chest Port 1 View  12/21/2014   CLINICAL DATA:  49 year old male with weakness and history of recent fall a few weeks ago.  EXAM: PORTABLE CHEST - 1 VIEW  COMPARISON:  Prior chest x-ray 12/07/2014  FINDINGS: Stable cardiomegaly. Increased pulmonary vascular congestion now bordering on mild interstitial edema. No large pleural effusion or evidence of pneumothorax. The patient is rotated to the left. Examination is slightly limited by underpenetration. No acute osseous abnormality.  IMPRESSION: Cardiomegaly with pulmonary vascular congestion bordering on mild interstitial edema. Findings suggest early/mild CHF.   Electronically Signed   By: Jacqulynn Cadet M.D.   On: 12/21/2014 16:41    Medications:  Prior to Admission:  Prescriptions prior to admission  Medication Sig Dispense Refill Last Dose  . ARIPiprazole (ABILIFY) 30 MG tablet Take 30 mg by mouth daily.    12/21/2014 at [place]  . calcium citrate (CALCITRATE - DOSED IN MG ELEMENTAL CALCIUM) 950 MG tablet Take 1 tablet by mouth daily.   12/21/2014 at [place]  . cholecalciferol (VITAMIN D) 1000 UNITS tablet Take 1,000 Units by mouth daily.   12/21/2014 at [place]  . citalopram (CELEXA) 40 MG tablet Take 40 mg by mouth at bedtime.    12/21/2014 at [place]  . enoxaparin (LOVENOX) 40 MG/0.4ML injection Inject 40 mg into the skin daily.   12/20/2014 at 1700  . Fe Bisgly-Succ-C-Thre-B12-FA (IRON-150 PO) Take 1 tablet by mouth daily.   12/21/2014 at 900a  . gabapentin (NEURONTIN) 300 MG capsule Take 300 mg by mouth 3 (three) times daily.   12/21/2014 at 1300  . HYDROmorphone (DILAUDID) 2 MG tablet Take 2 mg by mouth every 4 (four) hours as needed for moderate pain or severe pain.   12/21/2014 at 626a  . ipratropium-albuterol (DUONEB) 0.5-2.5 (3) MG/3ML SOLN Take 3 mLs by nebulization 3 (three) times daily. 360 mL 3 unknown  . lactulose,  encephalopathy, (CHRONULAC) 10 GM/15ML SOLN Take 20 g by mouth 3 (three) times daily.   12/21/2014 at 1300  . nadolol (CORGARD) 20 MG tablet Take 20 mg by mouth daily.   12/21/2014 at [place]  . oxyCODONE-acetaminophen (PERCOCET/ROXICET) 5-325 MG per tablet Take 1 tablet by mouth every 4 (four) hours as needed for moderate pain or severe pain.   12/20/2014 at Unknown time  . OXYGEN Inhale 3 L into the lungs daily.   12/21/2014 at Unknown time  . PROAIR HFA 108 (90 BASE) MCG/ACT inhaler Inhale 2 puffs into the lungs every 6 (six) hours as needed for wheezing or shortness of breath. Shortness of breath/wheeze   unknown  . rifaximin (XIFAXAN) 200 MG tablet Take 200 mg by mouth daily.   12/21/2014 at [place]  . Tiotropium Bromide Monohydrate 2.5 MCG/ACT AERS Inhale 1 capsule into the lungs daily.    12/20/2014 at 1700  . torsemide (DEMADEX) 20 MG tablet Take 40 mg by mouth daily.   12/21/2014 at [place]  . traZODone (DESYREL) 50 MG tablet Take 150 mg by mouth at bedtime.   12/20/2014 at 2100   Scheduled: . ARIPiprazole  30 mg Oral Daily  . calcium citrate  1 tablet Oral Daily  . Chlorhexidine Gluconate Cloth  6 each Topical Q0600  . cholecalciferol  1,000 Units Oral Daily  . citalopram  40 mg Oral QHS  . enoxaparin (LOVENOX) injection  80 mg Subcutaneous Q24H  . gabapentin  300 mg Oral TID  . ipratropium-albuterol  3 mL Nebulization TID  . lactulose  20 g Oral TID  . mupirocin ointment  1 application Nasal BID  . nadolol  20 mg Oral Daily  . rifaximin  200 mg Oral Daily  . sodium chloride  3 mL Intravenous Q12H  . torsemide  40 mg Oral Daily  . traZODone  150 mg Oral QHS   Continuous: . sodium chloride 50 mL/hr at 12/23/14 0531   LUD:APTCKFWBL, HYDROmorphone, ondansetron **OR** ondansetron (ZOFRAN) IV  Assesment: He was admitted with symptomatic anemia. He seems to have some element of anemia of chronic disease but some of it may be related to acute blood loss when he had his surgery about 10 days ago. I don't  think we can make a definitive diagnosis of what his anemia is from. I believe it is multi-factorial  He has cirrhosis of the liver and could have some element of anemia from that and he is noted to have large red blood cells on his CBC but his folic acid and T-02 levels are normal  He has chronic kidney disease and his basic metabolic profile this morning is pending  \He had severe fracture of his left leg requiring surgery and then he required a fasciotomy.  He has at least moderately severe COPD at baseline but is doing okay with his breathing  He has hypertension which is pretty well controlled  He has had episodes of syncope and I think he'll probably need to have an event monitor as an outpatient Active Problems:   Cirrhosis   CKD (chronic kidney disease)   Essential hypertension   Chronic pain   Anemia   Pressure ulcer    Plan: Depending on the results of his hemoglobin level etc. he may be able to go back to his skilled care facility for further rehabilitation    LOS: 2 days   Ocie Stanzione L 12/23/2014, 8:21 AM

## 2014-12-23 NOTE — Discharge Summary (Addendum)
Physician Discharge Summary  Patient ID: Jorge [name] [facility] MRN: [id number] DOB/AGE: 11-07-1965 49 y.o. Primary Care Physician:FANTA,TESFAYE, MD Admit date: 12/21/2014 Discharge date: 12/23/2014    Discharge Diagnoses:  Symptomatic multi-factorial anemia Active Problems:   Cirrhosis   CKD (chronic kidney disease)   Essential hypertension   Chronic pain   Anemia   Pressure ulcer   Syncope  recent comminuted fracture of the tibial plateau and fibula Recent episode of compartment syndrome requiring fasciotomy COPD Bipolar disease    Medication List    ASK your doctor about these medications        ARIPiprazole 30 MG tablet  Commonly known as:  ABILIFY  Take 30 mg by mouth daily.     calcium citrate 950 MG tablet  Commonly known as:  CALCITRATE - dosed in mg elemental calcium  Take 1 tablet by mouth daily.     cholecalciferol 1000 UNITS tablet  Commonly known as:  VITAMIN D  Take 1,000 Units by mouth daily.     citalopram 40 MG tablet  Commonly known as:  CELEXA  Take 40 mg by mouth at bedtime.     enoxaparin 40 MG/0.4ML injection  Commonly known as:  LOVENOX  Inject 40 mg into the skin daily.     gabapentin 300 MG capsule  Commonly known as:  NEURONTIN  Take 300 mg by mouth 3 (three) times daily.     HYDROmorphone 2 MG tablet  Commonly known as:  DILAUDID  Take 2 mg by mouth every 4 (four) hours as needed for moderate pain or severe pain.     ipratropium-albuterol 0.5-2.5 (3) MG/3ML Soln  Commonly known as:  DUONEB  Take 3 mLs by nebulization 3 (three) times daily.     IRON-150 PO  Take 1 tablet by mouth daily.     lactulose (encephalopathy) 10 GM/15ML Soln  Commonly known as:  CHRONULAC  Take 20 g by mouth 3 (three) times daily.     nadolol 20 MG tablet  Commonly known as:  CORGARD  Take 20 mg by mouth daily.     oxyCODONE-acetaminophen 5-325 MG per tablet  Commonly known as:  PERCOCET/ROXICET  Take 1 tablet by mouth every 4 (four) hours as needed  for moderate pain or severe pain.     OXYGEN  Inhale 3 L into the lungs daily.     PROAIR HFA 108 (90 BASE) MCG/ACT inhaler  Generic drug:  albuterol  Inhale 2 puffs into the lungs every 6 (six) hours as needed for wheezing or shortness of breath. Shortness of breath/wheeze     rifaximin 200 MG tablet  Commonly known as:  XIFAXAN  Take 200 mg by mouth daily.     Tiotropium Bromide Monohydrate 2.5 MCG/ACT Aers  Inhale 1 capsule into the lungs daily.     torsemide 20 MG tablet  Commonly known as:  DEMADEX  Take 40 mg by mouth daily.     traZODone 50 MG tablet  Commonly known as:  DESYREL  Take 150 mg by mouth at bedtime.        Discharged Condition: Improved    Consults:none  Significant Diagnostic Studies: Dg Chest 2 View  12/07/2014   CLINICAL DATA:  Fall with lower leg pain.  EXAM: CHEST  2 VIEW  COMPARISON:  10/27/2014 as well as 10/03/2014 and 08/29/2014  FINDINGS: Patient slightly rotated to the left. Lungs are adequately inflated without focal consolidation or effusion. There is stable cardiomegaly. Remainder of the exam is unchanged.  IMPRESSION: No active cardiopulmonary disease.  Stable cardiomegaly.   Electronically Signed   By: Marin Olp M.D.   On: 12/07/2014 18:13   Dg Tibia/fibula Left  12/21/2014   CLINICAL DATA:  49 year old male with recent history of fall with multiple fractures in the left leg status post ORIF. Weakness.  EXAM: LEFT TIBIA AND FIBULA - 2 VIEW  COMPARISON:  12/07/2014.  FINDINGS: Again noted is a comminuted fracture of the proximal tibia predominantly involving the metadiaphysis, but with intra-articular extension through the tibial spine. Proximal fibular fracture also noted, but poorly visualized on today's radiographs. Osteochondral injury in the lateral femoral condyle again noted. Compared to the prior study there has been interval ORIF of the proximal tibial fractures, with medial and lateral plate and screw fixation devices in position.  Multiple skin staples are noted along the medial aspect of the calf. Soft tissues appear diffusely swollen.  IMPRESSION: 1. Multiple fractures of the left lower extremity redemonstrated, with postoperative changes of ORIF in the left proximal tibia, as above. No acute complicating features.   Electronically Signed   By: Vinnie Langton M.D.   On: 12/21/2014 16:51   Dg Tibia/fibula Left  12/07/2014   CLINICAL DATA:  Fall.  Pain in lower leg.  EXAM: LEFT TIBIA AND FIBULA - 2 VIEW  COMPARISON:  None.  FINDINGS: There is a comminuted fracture of the proximal tibia, which extends from the level of the anterior tibial spines inferiorly, involving both medial lateral aspects. There is a small defect along the lateral femoral condyle. There is a fracture involving the proximal aspect of the fibula, minimally displaced. A nondisplaced probable fracture is noted the distal fibula. Distal tibia appears normal.  IMPRESSION: 1. Comminuted fracture of the proximal tibia involving proximal joint. 2. Osteochondral fragment of the lateral condyle of the femur. 3. Proximal and distal fractures of the fibula.   Electronically Signed   By: Nolon Nations M.D.   On: 12/07/2014 18:14   Dg Chest Port 1 View  12/21/2014   CLINICAL DATA:  49 year old male with weakness and history of recent fall a few weeks ago.  EXAM: PORTABLE CHEST - 1 VIEW  COMPARISON:  Prior chest x-ray 12/07/2014  FINDINGS: Stable cardiomegaly. Increased pulmonary vascular congestion now bordering on mild interstitial edema. No large pleural effusion or evidence of pneumothorax. The patient is rotated to the left. Examination is slightly limited by underpenetration. No acute osseous abnormality.  IMPRESSION: Cardiomegaly with pulmonary vascular congestion bordering on mild interstitial edema. Findings suggest early/mild CHF.   Electronically Signed   By: Jacqulynn Cadet M.D.   On: 12/21/2014 16:41   Dg Knee Complete 4 Views Left  12/07/2014   CLINICAL  DATA:  Pain in lower leg after fall  EXAM: LEFT KNEE - COMPLETE 4+ VIEW  COMPARISON:  None.  FINDINGS: There is a comminuted minimally displaced proximal tibial metaphyseal fracture which extends longitudinally up to the tibial spines and probably also reaches the articular surface of the lateral plateau without step-off. There is a transverse minimally displaced proximal fibular fracture. There is fragmentation of the lateral femoral condyle which may also represent an acute fracture, mildly depressed. No bone lesion or bony destruction is evident to suggest a pathologic basis for the fracture. No acute soft tissue abnormality is evident.  IMPRESSION: Comminuted minimally displaced proximal tibial fracture which extends into the tibial spines and may also reach the lateral plateau articular surface. Minimally displaced proximal fibular fracture. Fragmented lateral femoral condyle, possibly also  an acute fracture.   Electronically Signed   By: Andreas Newport M.D.   On: 12/07/2014 18:12    Lab Results: Basic Metabolic Panel:  Recent Labs  12/21/14 1541 12/22/14 1140  NA 135 139  K 3.5 3.2*  CL 95* 96*  CO2 31 34*  GLUCOSE 129* 159*  BUN 27* 22*  CREATININE 1.30* 1.10  CALCIUM 7.4* 7.4*   Liver Function Tests:  Recent Labs  12/21/14 1541 12/22/14 1140  AST 37 34  ALT 32 30  ALKPHOS 84 80  BILITOT 2.0* 3.0*  PROT 5.7* 5.7*  ALBUMIN 2.4* 2.3*     CBC:  Recent Labs  12/21/14 1541 12/22/14 1140  WBC 5.5 4.2  NEUTROABS 4.0  --   HGB 7.2* 8.2*  HCT 22.9* 25.4*  MCV 108.5* 105.0*  PLT 180 162    Recent Results (from the past 240 hour(s))  Culture, blood (routine x 2)     Status: None (Preliminary result)   Collection Time: 12/21/14  3:41 PM  Result Value Ref Range Status   Specimen Description BLOOD PICC LINE DRAWN BY RN S.H.  Final   Special Requests BOTTLES DRAWN AEROBIC AND ANAEROBIC 6CC  Final   Culture NO GROWTH < 24 HOURS  Final   Report Status PENDING   Incomplete  Culture, blood (routine x 2)     Status: None (Preliminary result)   Collection Time: 12/21/14  3:48 PM  Result Value Ref Range Status   Specimen Description BLOOD LEFT HAND  Final   Special Requests BOTTLES DRAWN AEROBIC AND ANAEROBIC 6CC  Final   Culture NO GROWTH < 24 HOURS  Final   Report Status PENDING  Incomplete  Urine culture     Status: None (Preliminary result)   Collection Time: 12/21/14  4:18 PM  Result Value Ref Range Status   Specimen Description URINE, CATHETERIZED  Final   Special Requests NONE  Final   Culture   Final    NO GROWTH < 24 HOURS Performed at [facility]    Report Status PENDING  Incomplete  MRSA PCR Screening     Status: Abnormal   Collection Time: 12/22/14  1:10 AM  Result Value Ref Range Status   MRSA by PCR POSITIVE (A) NEGATIVE Final    Comment:        The GeneXpert MRSA Assay (FDA approved for NASAL specimens only), is one component of a comprehensive MRSA colonization surveillance program. It is not intended to diagnose MRSA infection nor to guide or monitor treatment for MRSA infections.      Hospital Course: He was brought from the skilled care facility because of weakness. He was sent for PICC line placement and then he was noted to have a hemoglobin level of 7.3. He was sent to the emergency department. He has a complicated medical history including cirrhosis of the liver, bipolar disease, COPD, hypertension and a recent fall with comminuted fracture to the left lower leg involving his tibia and fibula the required open reduction internal fixation skin grafting and later required a fasciotomy. He had been sent to a nursing home for rehabilitation. Because of his anemia he was brought to the hospital. He received blood transfusion. He had a syncopal episode apparently the caused his fall and fracture and he had echocardiogram while he was at [facility] that did not show a cause of this. His history sounds like it could be  cardiac so he will  require a event monitor as an outpatient.  He has improved after transfusion. He had anemia profile that is very nonspecific. He does not show evidence of iron deficiency anemia folic acid deficiency or vitamin B-12 deficiency. It is felt that he has multifactorial anemia and this includes his acute injury potentially some blood loss from that perhaps chronic disease from his cirrhosis. He is back at baseline at the time of discharge  Discharge Exam: Blood pressure 113/56, pulse 60, temperature 98 F (36.7 C), temperature source Oral, resp. rate 20, height 6\' 3"  (1.905 m), weight 154.2 kg (339 lb 15.2 oz), SpO2 97 %. He is awake and alert. His chest is relatively clear. He has a surgical wound below his left knee and has fasciotomy scar on his left calf medially his heart is regular  Disposition: Return to skilled care facility. Resume his rehabilitation. He has a PICC line in place now and this can be used to draw blood. He needs a CBC on 12/24/2014 and further laboratory work determined by his facility physician After the above was dictated  His SNF called that he has a wound culture positive for MRSA. He was given 2.5 grams of iv vancomycin and will need 1.5 gram iv q12h at the facility for at least 10 days. He needs monitoring and dose adjustment by facility pharmacy.     Signed: Adrienne Delay L   12/23/2014, 8:27 AM

## 2014-12-23 NOTE — Care Management Note (Signed)
Case Management Note  Patient Details  Name: Samaj [facility] MRN: [id number] Date of Birth: [birth year]  Subjective/Objective:                    Action/Plan:   Expected Discharge Date:                  Expected Discharge Plan:  Skilled Nursing Facility  In-House Referral:  Clinical Social Work  Discharge planning Services  CM Consult  Post Acute Care Choice:  NA Choice offered to:  NA  DME Arranged:    DME Agency:     HH Arranged:    [place] Agency:     Status of Service:  Completed, signed off  Medicare Important Message Given:    Date Medicare IM Given:    Medicare IM give by:    Date Additional Medicare IM Given:    Additional Medicare Important Message give by:     If discussed at [place] of Stay Meetings, dates discussed:    Additional Comments: Pt discharged to [facility] today. CSW to arrange discharge to facility. Christinia Gully [place], RN 12/23/2014, 1:05 PM

## 2014-12-23 NOTE — Progress Notes (Signed)
ANTIBIOTIC CONSULT NOTE - INITIAL  Pharmacy Consult for vancomycin Indication: wound infection  Allergies  Allergen Reactions  . Penicillins Anaphylaxis    Patient states he is not allergic to anything;  Pt says it's his twin brother that has this allergy.    Patient Measurements: Height: 6\' 3"  (190.5 cm) Weight: (!) 339 lb 15.2 oz (154.2 kg) IBW/kg (Calculated) : 84.5   Vital Signs: Temp: 98 F (36.7 C) (08/10 0659) Temp Source: Oral (08/10 0659) BP: 113/56 mmHg (08/10 0659) Pulse Rate: 60 (08/10 0659) Intake/Output from previous day: 08/09 0701 - 08/10 0700 In: 815 [P.O.:480; Blood:335] Out: 3100 [Urine:3100] Intake/Output from this shift: Total I/O In: 370 [P.O.:360; I.V.:10] Out: -   Labs:  Recent Labs  12/21/14 1541 12/22/14 1140 12/23/14 0846  WBC 5.5 4.2 4.7  HGB 7.2* 8.2* 8.2*  PLT 180 162 162  CREATININE 1.30* 1.10 1.15   Estimated Creatinine Clearance: 122.2 mL/min (by C-G formula based on Cr of 1.15). No results for input(s): VANCOTROUGH, VANCOPEAK, VANCORANDOM, GENTTROUGH, GENTPEAK, GENTRANDOM, TOBRATROUGH, TOBRAPEAK, TOBRARND, AMIKACINPEAK, AMIKACINTROU, AMIKACIN in the last 72 hours.   Microbiology: Recent Results (from the past 720 hour(s))  Culture, blood (routine x 2)     Status: None (Preliminary result)   Collection Time: 12/21/14  3:41 PM  Result Value Ref Range Status   Specimen Description BLOOD PICC LINE DRAWN BY RN S.H.  Final   Special Requests BOTTLES DRAWN AEROBIC AND ANAEROBIC 6CC  Final   Culture NO GROWTH < 24 HOURS  Final   Report Status PENDING  Incomplete  Culture, blood (routine x 2)     Status: None (Preliminary result)   Collection Time: 12/21/14  3:48 PM  Result Value Ref Range Status   Specimen Description BLOOD LEFT HAND  Final   Special Requests BOTTLES DRAWN AEROBIC AND ANAEROBIC 6CC  Final   Culture NO GROWTH < 24 HOURS  Final   Report Status PENDING  Incomplete  Urine culture     Status: None (Preliminary  result)   Collection Time: 12/21/14  4:18 PM  Result Value Ref Range Status   Specimen Description URINE, CATHETERIZED  Final   Special Requests NONE  Final   Culture   Final    NO GROWTH < 24 HOURS Performed at [facility]    Report Status PENDING  Incomplete  MRSA PCR Screening     Status: Abnormal   Collection Time: 12/22/14  1:10 AM  Result Value Ref Range Status   MRSA by PCR POSITIVE (A) NEGATIVE Final    Comment:        The GeneXpert MRSA Assay (FDA approved for NASAL specimens only), is one component of a comprehensive MRSA colonization surveillance program. It is not intended to diagnose MRSA infection nor to guide or monitor treatment for MRSA infections.     Medical History: Past Medical History  Diagnosis Date  . COPD (chronic obstructive pulmonary disease)   . Cirrhosis sleep apnea  . Hypertension   . Renal disorder     stage II  . Sleep apnea   . Tobacco abuse   . Syncope   . Suicide attempt     Medications:  Prescriptions prior to admission  Medication Sig Dispense Refill Last Dose  . ARIPiprazole (ABILIFY) 30 MG tablet Take 30 mg by mouth daily.    12/21/2014 at [place]  . calcium citrate (CALCITRATE - DOSED IN MG ELEMENTAL CALCIUM) 950 MG tablet Take 1 tablet by mouth daily.   12/21/2014  at [place]  . cholecalciferol (VITAMIN D) 1000 UNITS tablet Take 1,000 Units by mouth daily.   12/21/2014 at [place]  . citalopram (CELEXA) 40 MG tablet Take 40 mg by mouth at bedtime.    12/21/2014 at [place]  . enoxaparin (LOVENOX) 40 MG/0.4ML injection Inject 40 mg into the skin daily.   12/20/2014 at 1700  . Fe Bisgly-Succ-C-Thre-B12-FA (IRON-150 PO) Take 1 tablet by mouth daily.   12/21/2014 at 900a  . gabapentin (NEURONTIN) 300 MG capsule Take 300 mg by mouth 3 (three) times daily.   12/21/2014 at 1300  . HYDROmorphone (DILAUDID) 2 MG tablet Take 2 mg by mouth every 4 (four) hours as needed for moderate pain or severe pain.   12/21/2014 at 626a  . ipratropium-albuterol (DUONEB)  0.5-2.5 (3) MG/3ML SOLN Take 3 mLs by nebulization 3 (three) times daily. 360 mL 3 unknown  . lactulose, encephalopathy, (CHRONULAC) 10 GM/15ML SOLN Take 20 g by mouth 3 (three) times daily.   12/21/2014 at 1300  . nadolol (CORGARD) 20 MG tablet Take 20 mg by mouth daily.   12/21/2014 at [place]  . oxyCODONE-acetaminophen (PERCOCET/ROXICET) 5-325 MG per tablet Take 1 tablet by mouth every 4 (four) hours as needed for moderate pain or severe pain.   12/20/2014 at Unknown time  . OXYGEN Inhale 3 L into the lungs daily.   12/21/2014 at Unknown time  . PROAIR HFA 108 (90 BASE) MCG/ACT inhaler Inhale 2 puffs into the lungs every 6 (six) hours as needed for wheezing or shortness of breath. Shortness of breath/wheeze   unknown  . rifaximin (XIFAXAN) 200 MG tablet Take 200 mg by mouth daily.   12/21/2014 at [place]  . Tiotropium Bromide Monohydrate 2.5 MCG/ACT AERS Inhale 1 capsule into the lungs daily.    12/20/2014 at 1700  . torsemide (DEMADEX) 20 MG tablet Take 40 mg by mouth daily.   12/21/2014 at [place]  . traZODone (DESYREL) 50 MG tablet Take 150 mg by mouth at bedtime.   12/20/2014 at 2100   Assessment: 49 yo man with MRSA in wound culture from nursing home.  His CrCl >100.    Goal of Therapy:  Vancomycin trough level 10-15 mcg/ml  Plan:  Vancomycin 2500 mg IV X 1 then 1500 mg IV q12 hours per obesity nomogram. F/U renal function, cultures and clinical course  Thanks for allowing pharmacy to be a part of this patient's care.  Excell Seltzer, PharmD Clinical Pharmacist

## 2014-12-24 DIAGNOSIS — K7030 Alcoholic cirrhosis of liver without ascites: Secondary | ICD-10-CM | POA: Diagnosis not present

## 2014-12-24 DIAGNOSIS — N189 Chronic kidney disease, unspecified: Secondary | ICD-10-CM | POA: Diagnosis not present

## 2014-12-24 DIAGNOSIS — L7682 Other postprocedural complications of skin and subcutaneous tissue: Secondary | ICD-10-CM | POA: Diagnosis not present

## 2014-12-24 DIAGNOSIS — G8929 Other chronic pain: Secondary | ICD-10-CM | POA: Diagnosis not present

## 2014-12-24 DIAGNOSIS — A4902 Methicillin resistant Staphylococcus aureus infection, unspecified site: Secondary | ICD-10-CM | POA: Diagnosis not present

## 2014-12-24 LAB — TYPE AND SCREEN
ABO/RH(D): A POS
ANTIBODY SCREEN: POSITIVE
DAT, IgG: NEGATIVE
DONOR AG TYPE: NEGATIVE
Donor AG Type: NEGATIVE
PT AG Type: NEGATIVE
Unit division: 0
Unit division: 0

## 2014-12-25 DIAGNOSIS — L7682 Other postprocedural complications of skin and subcutaneous tissue: Secondary | ICD-10-CM | POA: Diagnosis not present

## 2014-12-25 DIAGNOSIS — N189 Chronic kidney disease, unspecified: Secondary | ICD-10-CM | POA: Diagnosis not present

## 2014-12-25 DIAGNOSIS — G8929 Other chronic pain: Secondary | ICD-10-CM | POA: Diagnosis not present

## 2014-12-25 DIAGNOSIS — K7030 Alcoholic cirrhosis of liver without ascites: Secondary | ICD-10-CM | POA: Diagnosis not present

## 2014-12-25 DIAGNOSIS — A4902 Methicillin resistant Staphylococcus aureus infection, unspecified site: Secondary | ICD-10-CM | POA: Diagnosis not present

## 2014-12-26 DIAGNOSIS — L7682 Other postprocedural complications of skin and subcutaneous tissue: Secondary | ICD-10-CM | POA: Diagnosis not present

## 2014-12-26 DIAGNOSIS — N189 Chronic kidney disease, unspecified: Secondary | ICD-10-CM | POA: Diagnosis not present

## 2014-12-26 DIAGNOSIS — A4902 Methicillin resistant Staphylococcus aureus infection, unspecified site: Secondary | ICD-10-CM | POA: Diagnosis not present

## 2014-12-26 DIAGNOSIS — G8929 Other chronic pain: Secondary | ICD-10-CM | POA: Diagnosis not present

## 2014-12-26 LAB — CULTURE, BLOOD (ROUTINE X 2)
CULTURE: NO GROWTH
CULTURE: NO GROWTH

## 2014-12-28 DIAGNOSIS — E46 Unspecified protein-calorie malnutrition: Secondary | ICD-10-CM | POA: Diagnosis not present

## 2014-12-28 DIAGNOSIS — R001 Bradycardia, unspecified: Secondary | ICD-10-CM | POA: Diagnosis not present

## 2014-12-28 DIAGNOSIS — J9611 Chronic respiratory failure with hypoxia: Secondary | ICD-10-CM | POA: Diagnosis not present

## 2014-12-28 DIAGNOSIS — K7030 Alcoholic cirrhosis of liver without ascites: Secondary | ICD-10-CM | POA: Diagnosis not present

## 2014-12-28 DIAGNOSIS — D62 Acute posthemorrhagic anemia: Secondary | ICD-10-CM | POA: Diagnosis not present

## 2014-12-28 DIAGNOSIS — K5900 Constipation, unspecified: Secondary | ICD-10-CM | POA: Diagnosis not present

## 2014-12-28 DIAGNOSIS — J449 Chronic obstructive pulmonary disease, unspecified: Secondary | ICD-10-CM | POA: Diagnosis not present

## 2014-12-28 DIAGNOSIS — G4733 Obstructive sleep apnea (adult) (pediatric): Secondary | ICD-10-CM | POA: Diagnosis not present

## 2014-12-29 ENCOUNTER — Other Ambulatory Visit (HOSPITAL_COMMUNITY): Payer: Self-pay | Admitting: Internal Medicine

## 2014-12-29 DIAGNOSIS — S82832D Other fracture of upper and lower end of left fibula, subsequent encounter for closed fracture with routine healing: Principal | ICD-10-CM

## 2014-12-29 DIAGNOSIS — D62 Acute posthemorrhagic anemia: Secondary | ICD-10-CM | POA: Diagnosis not present

## 2014-12-29 DIAGNOSIS — S82302D Unspecified fracture of lower end of left tibia, subsequent encounter for closed fracture with routine healing: Secondary | ICD-10-CM

## 2014-12-29 DIAGNOSIS — G4733 Obstructive sleep apnea (adult) (pediatric): Secondary | ICD-10-CM | POA: Diagnosis not present

## 2014-12-29 DIAGNOSIS — R001 Bradycardia, unspecified: Secondary | ICD-10-CM | POA: Diagnosis not present

## 2014-12-29 DIAGNOSIS — J449 Chronic obstructive pulmonary disease, unspecified: Secondary | ICD-10-CM | POA: Diagnosis not present

## 2014-12-29 DIAGNOSIS — E46 Unspecified protein-calorie malnutrition: Secondary | ICD-10-CM | POA: Diagnosis not present

## 2014-12-29 DIAGNOSIS — J9611 Chronic respiratory failure with hypoxia: Secondary | ICD-10-CM | POA: Diagnosis not present

## 2014-12-29 DIAGNOSIS — K5900 Constipation, unspecified: Secondary | ICD-10-CM | POA: Diagnosis not present

## 2014-12-29 DIAGNOSIS — R700 Elevated erythrocyte sedimentation rate: Secondary | ICD-10-CM | POA: Diagnosis not present

## 2014-12-29 DIAGNOSIS — K7030 Alcoholic cirrhosis of liver without ascites: Secondary | ICD-10-CM | POA: Diagnosis not present

## 2014-12-31 DIAGNOSIS — N183 Chronic kidney disease, stage 3 (moderate): Secondary | ICD-10-CM | POA: Diagnosis not present

## 2014-12-31 DIAGNOSIS — R001 Bradycardia, unspecified: Secondary | ICD-10-CM | POA: Diagnosis not present

## 2014-12-31 DIAGNOSIS — R700 Elevated erythrocyte sedimentation rate: Secondary | ICD-10-CM | POA: Diagnosis not present

## 2014-12-31 DIAGNOSIS — D62 Acute posthemorrhagic anemia: Secondary | ICD-10-CM | POA: Diagnosis not present

## 2015-01-02 DIAGNOSIS — D539 Nutritional anemia, unspecified: Secondary | ICD-10-CM | POA: Diagnosis not present

## 2015-01-02 DIAGNOSIS — N189 Chronic kidney disease, unspecified: Secondary | ICD-10-CM | POA: Diagnosis not present

## 2015-01-02 DIAGNOSIS — A4902 Methicillin resistant Staphylococcus aureus infection, unspecified site: Secondary | ICD-10-CM | POA: Diagnosis not present

## 2015-01-02 DIAGNOSIS — R700 Elevated erythrocyte sedimentation rate: Secondary | ICD-10-CM | POA: Diagnosis not present

## 2015-01-04 DIAGNOSIS — L7682 Other postprocedural complications of skin and subcutaneous tissue: Secondary | ICD-10-CM | POA: Diagnosis not present

## 2015-01-04 DIAGNOSIS — G8929 Other chronic pain: Secondary | ICD-10-CM | POA: Diagnosis not present

## 2015-01-04 DIAGNOSIS — A4902 Methicillin resistant Staphylococcus aureus infection, unspecified site: Secondary | ICD-10-CM | POA: Diagnosis not present

## 2015-01-04 DIAGNOSIS — E860 Dehydration: Secondary | ICD-10-CM | POA: Diagnosis not present

## 2015-01-04 DIAGNOSIS — N189 Chronic kidney disease, unspecified: Secondary | ICD-10-CM | POA: Diagnosis not present

## 2015-01-04 DIAGNOSIS — R700 Elevated erythrocyte sedimentation rate: Secondary | ICD-10-CM | POA: Diagnosis not present

## 2015-01-04 DIAGNOSIS — R5381 Other malaise: Secondary | ICD-10-CM | POA: Diagnosis not present

## 2015-01-06 ENCOUNTER — Ambulatory Visit (HOSPITAL_COMMUNITY)
Admission: RE | Admit: 2015-01-06 | Discharge: 2015-01-06 | Disposition: A | Discharge status: Home / Self Care | Payer: Medicare Other | Source: Ambulatory Visit | Attending: Internal Medicine | Admitting: Internal Medicine

## 2015-01-06 DIAGNOSIS — G8929 Other chronic pain: Secondary | ICD-10-CM | POA: Diagnosis not present

## 2015-01-06 DIAGNOSIS — A4902 Methicillin resistant Staphylococcus aureus infection, unspecified site: Secondary | ICD-10-CM | POA: Diagnosis not present

## 2015-01-06 DIAGNOSIS — R700 Elevated erythrocyte sedimentation rate: Secondary | ICD-10-CM | POA: Diagnosis not present

## 2015-01-06 DIAGNOSIS — L7682 Other postprocedural complications of skin and subcutaneous tissue: Secondary | ICD-10-CM | POA: Diagnosis not present

## 2015-01-06 DIAGNOSIS — E860 Dehydration: Secondary | ICD-10-CM | POA: Diagnosis not present

## 2015-01-06 DIAGNOSIS — R5381 Other malaise: Secondary | ICD-10-CM | POA: Diagnosis not present

## 2015-01-06 DIAGNOSIS — L98419 Non-pressure chronic ulcer of buttock with unspecified severity: Secondary | ICD-10-CM | POA: Diagnosis not present

## 2015-01-06 DIAGNOSIS — N189 Chronic kidney disease, unspecified: Secondary | ICD-10-CM | POA: Diagnosis not present

## 2015-01-08 DIAGNOSIS — G8929 Other chronic pain: Secondary | ICD-10-CM | POA: Diagnosis not present

## 2015-01-08 DIAGNOSIS — E860 Dehydration: Secondary | ICD-10-CM | POA: Diagnosis not present

## 2015-01-08 DIAGNOSIS — R700 Elevated erythrocyte sedimentation rate: Secondary | ICD-10-CM | POA: Diagnosis not present

## 2015-01-08 DIAGNOSIS — A4902 Methicillin resistant Staphylococcus aureus infection, unspecified site: Secondary | ICD-10-CM | POA: Diagnosis not present

## 2015-01-08 DIAGNOSIS — R5381 Other malaise: Secondary | ICD-10-CM | POA: Diagnosis not present

## 2015-01-08 DIAGNOSIS — L7682 Other postprocedural complications of skin and subcutaneous tissue: Secondary | ICD-10-CM | POA: Diagnosis not present

## 2015-01-08 DIAGNOSIS — N189 Chronic kidney disease, unspecified: Secondary | ICD-10-CM | POA: Diagnosis not present

## 2015-01-10 DIAGNOSIS — E860 Dehydration: Secondary | ICD-10-CM | POA: Diagnosis not present

## 2015-01-10 DIAGNOSIS — R700 Elevated erythrocyte sedimentation rate: Secondary | ICD-10-CM | POA: Diagnosis not present

## 2015-01-10 DIAGNOSIS — G8929 Other chronic pain: Secondary | ICD-10-CM | POA: Diagnosis not present

## 2015-01-10 DIAGNOSIS — R5381 Other malaise: Secondary | ICD-10-CM | POA: Diagnosis not present

## 2015-01-10 DIAGNOSIS — L7682 Other postprocedural complications of skin and subcutaneous tissue: Secondary | ICD-10-CM | POA: Diagnosis not present

## 2015-01-10 DIAGNOSIS — N189 Chronic kidney disease, unspecified: Secondary | ICD-10-CM | POA: Diagnosis not present

## 2015-01-10 DIAGNOSIS — A4902 Methicillin resistant Staphylococcus aureus infection, unspecified site: Secondary | ICD-10-CM | POA: Diagnosis not present

## 2015-01-12 DIAGNOSIS — N189 Chronic kidney disease, unspecified: Secondary | ICD-10-CM | POA: Diagnosis not present

## 2015-01-12 DIAGNOSIS — R700 Elevated erythrocyte sedimentation rate: Secondary | ICD-10-CM | POA: Diagnosis not present

## 2015-01-12 DIAGNOSIS — G4733 Obstructive sleep apnea (adult) (pediatric): Secondary | ICD-10-CM | POA: Diagnosis not present

## 2015-01-12 DIAGNOSIS — D539 Nutritional anemia, unspecified: Secondary | ICD-10-CM | POA: Diagnosis not present

## 2015-01-12 DIAGNOSIS — A4902 Methicillin resistant Staphylococcus aureus infection, unspecified site: Secondary | ICD-10-CM | POA: Diagnosis not present

## 2015-01-14 DIAGNOSIS — R700 Elevated erythrocyte sedimentation rate: Secondary | ICD-10-CM | POA: Diagnosis not present

## 2015-01-14 DIAGNOSIS — G4733 Obstructive sleep apnea (adult) (pediatric): Secondary | ICD-10-CM | POA: Diagnosis not present

## 2015-01-14 DIAGNOSIS — N189 Chronic kidney disease, unspecified: Secondary | ICD-10-CM | POA: Diagnosis not present

## 2015-01-14 DIAGNOSIS — D539 Nutritional anemia, unspecified: Secondary | ICD-10-CM | POA: Diagnosis not present

## 2015-01-14 DIAGNOSIS — A4902 Methicillin resistant Staphylococcus aureus infection, unspecified site: Secondary | ICD-10-CM | POA: Diagnosis not present

## 2015-01-15 DIAGNOSIS — R05 Cough: Secondary | ICD-10-CM | POA: Diagnosis not present

## 2015-01-16 DIAGNOSIS — N189 Chronic kidney disease, unspecified: Secondary | ICD-10-CM | POA: Diagnosis not present

## 2015-01-16 DIAGNOSIS — A4902 Methicillin resistant Staphylococcus aureus infection, unspecified site: Secondary | ICD-10-CM | POA: Diagnosis not present

## 2015-01-16 DIAGNOSIS — D539 Nutritional anemia, unspecified: Secondary | ICD-10-CM | POA: Diagnosis not present

## 2015-01-16 DIAGNOSIS — G4733 Obstructive sleep apnea (adult) (pediatric): Secondary | ICD-10-CM | POA: Diagnosis not present

## 2015-01-16 DIAGNOSIS — R700 Elevated erythrocyte sedimentation rate: Secondary | ICD-10-CM | POA: Diagnosis not present

## 2015-01-18 DIAGNOSIS — D539 Nutritional anemia, unspecified: Secondary | ICD-10-CM | POA: Diagnosis not present

## 2015-01-18 DIAGNOSIS — R700 Elevated erythrocyte sedimentation rate: Secondary | ICD-10-CM | POA: Diagnosis not present

## 2015-01-18 DIAGNOSIS — G4733 Obstructive sleep apnea (adult) (pediatric): Secondary | ICD-10-CM | POA: Diagnosis not present

## 2015-01-18 DIAGNOSIS — N189 Chronic kidney disease, unspecified: Secondary | ICD-10-CM | POA: Diagnosis not present

## 2015-01-19 ENCOUNTER — Ambulatory Visit (HOSPITAL_COMMUNITY)
Admission: RE | Admit: 2015-01-19 | Discharge: 2015-01-19 | Disposition: A | Discharge status: Home / Self Care | Payer: Medicare Other | Source: Ambulatory Visit | Attending: Internal Medicine | Admitting: Internal Medicine

## 2015-01-19 DIAGNOSIS — B998 Other infectious disease: Secondary | ICD-10-CM | POA: Insufficient documentation

## 2015-01-19 NOTE — Discharge Instructions (Signed)
PICC Insertion, Care After  Refer to this sheet in the next few weeks. These instructions provide you with information on caring for yourself after your procedure. Your health care provider may also give you more specific instructions. Your treatment has been planned according to current medical practices, but problems sometimes occur. Call your health care provider if you have any problems or questions after your procedure.  WHAT TO EXPECT AFTER THE PROCEDURE  After your procedure, it is typical to have the following:   Mild discomfort at the insertion site. This should not last more than a day.  HOME CARE INSTRUCTIONS   Rest at home for the remainder of the day after the procedure.   You may bend your arm and move it freely. If your PICC is near or at the bend of your elbow, avoid activity with repeated motion at the elbow.   Avoid lifting heavy objects as instructed by your health care provider.   Avoid using a crutch with the arm on the same side as your PICC. You may need to use a walker.  Bandage Care   Keep your PICC bandage (dressing) clean and dry to prevent infection.   Ask your health care provider when you may shower. To keep the dressing dry, cover the PICC with plastic wrap and tape before showering. If the dressing does become wet, replace it right after the shower.   Do not soak in the bath, swim, or use hot tubs when you have a PICC.   Change the PICC dressing as instructed by your health care provider.   Change your PICC dressing if it becomes loose or wet.  General PICC Care   Check the PICC insertion site daily for leakage, redness, swelling, or pain.   Flush the PICC as directed by your health care provider. Let your health care provider know right away if the PICC is difficult to flush or does not flush. Do not use force to flush the PICC.   Do not use a syringe that is less than 10 mL to flush the PICC.   Never pull or tug on the PICC.   Avoid blood pressure checks on the arm  with the PICC.   Keep your PICC identification card with you at all times.   Do not take the PICC out yourself. Only a trained health care professional should remove the PICC.  SEEK MEDICAL CARE IF:   You have pain in your arm, ear, face, or teeth.   You have fever or chills.   You have drainage from the PICC insertion site.   You have redness or palpate a "cord" around the PICC insertion site.   You cannot flush the catheter.  SEEK IMMEDIATE MEDICAL CARE IF:   You have swelling in the arm in which the PICC is inserted.  Document Released: 02/19/2013 Document Revised: 05/06/2013 Document Reviewed: 02/19/2013  ExitCare Patient Information 2015 [employer]. This information is not intended to replace advice given to you by your health care provider. Make sure you discuss any questions you have with your health care provider.

## 2015-01-19 NOTE — Progress Notes (Signed)
Peripherally Inserted Central Catheter/Midline Placement  The IV Nurse has discussed with the patient and/or persons authorized to consent for the patient, the purpose of this procedure and the potential benefits and risks involved with this procedure.  The benefits include less needle sticks, lab draws from the catheter and patient may be discharged home with the catheter.  Risks include, but not limited to, infection, bleeding, blood clot (thrombus formation), and puncture of an artery; nerve damage and irregular heat beat.  Alternatives to this procedure were also discussed.  PICC/Midline Placement Documentation  PICC / Midline Single Lumen 01/19/15 PICC Right Cephalic 41 cm 0 cm (Active)  Indication for Insertion or Continuance of Line Prolonged intravenous therapies 01/19/2015 10:49 AM  Exposed Catheter (cm) 0 cm 01/19/2015 10:49 AM  Site Assessment Clean;Dry;Intact 01/19/2015 10:49 AM  Line Status Saline locked;Blood return noted 01/19/2015 10:49 AM  Dressing Type Transparent 01/19/2015 10:49 AM  Dressing Status Clean;Dry;Intact;Antimicrobial disc in place 01/19/2015 10:49 AM  Line Care Connections checked and tightened 01/19/2015 10:49 AM  Dressing Intervention New dressing 01/19/2015 10:49 AM  Dressing Change Due 01/26/15 01/19/2015 10:49 AM     PICC / Midline Double Lumen 12/21/14 PICC Right Cephalic 41 cm 0 cm (Active)       Hillery Jacks 01/19/2015, 10:50 AM

## 2015-01-20 DIAGNOSIS — L98419 Non-pressure chronic ulcer of buttock with unspecified severity: Secondary | ICD-10-CM | POA: Diagnosis not present

## 2015-01-21 DIAGNOSIS — R700 Elevated erythrocyte sedimentation rate: Secondary | ICD-10-CM | POA: Diagnosis not present

## 2015-01-21 DIAGNOSIS — N189 Chronic kidney disease, unspecified: Secondary | ICD-10-CM | POA: Diagnosis not present

## 2015-01-21 DIAGNOSIS — S82202A Unspecified fracture of shaft of left tibia, initial encounter for closed fracture: Secondary | ICD-10-CM | POA: Diagnosis not present

## 2015-01-21 DIAGNOSIS — D539 Nutritional anemia, unspecified: Secondary | ICD-10-CM | POA: Diagnosis not present

## 2015-01-25 DIAGNOSIS — N189 Chronic kidney disease, unspecified: Secondary | ICD-10-CM | POA: Diagnosis not present

## 2015-01-25 DIAGNOSIS — A4902 Methicillin resistant Staphylococcus aureus infection, unspecified site: Secondary | ICD-10-CM | POA: Diagnosis not present

## 2015-01-25 DIAGNOSIS — L7682 Other postprocedural complications of skin and subcutaneous tissue: Secondary | ICD-10-CM | POA: Diagnosis not present

## 2015-01-25 DIAGNOSIS — D539 Nutritional anemia, unspecified: Secondary | ICD-10-CM | POA: Diagnosis not present

## 2015-01-28 ENCOUNTER — Ambulatory Visit
Admission: RE | Admit: 2015-01-28 | Discharge: 2015-01-28 | Disposition: A | Discharge status: Home / Self Care | Payer: Medicare Other | Source: Ambulatory Visit | Attending: Internal Medicine | Admitting: Internal Medicine

## 2015-01-28 DIAGNOSIS — S82302D Unspecified fracture of lower end of left tibia, subsequent encounter for closed fracture with routine healing: Secondary | ICD-10-CM

## 2015-01-28 DIAGNOSIS — R600 Localized edema: Secondary | ICD-10-CM | POA: Diagnosis not present

## 2015-01-28 DIAGNOSIS — S82832D Other fracture of upper and lower end of left fibula, subsequent encounter for closed fracture with routine healing: Principal | ICD-10-CM

## 2015-01-28 MED ORDER — GADOBENATE DIMEGLUMINE 529 MG/ML IV SOLN
20.0000 mL | Freq: Once | INTRAVENOUS | Status: AC | PRN
  Administered 2015-01-28: 20 mL via INTRAVENOUS

## 2015-01-29 DIAGNOSIS — S82142D Displaced bicondylar fracture of left tibia, subsequent encounter for closed fracture with routine healing: Secondary | ICD-10-CM | POA: Diagnosis not present

## 2015-01-29 DIAGNOSIS — S82832D Other fracture of upper and lower end of left fibula, subsequent encounter for closed fracture with routine healing: Secondary | ICD-10-CM | POA: Diagnosis not present

## 2015-01-29 DIAGNOSIS — S82192D Other fracture of upper end of left tibia, subsequent encounter for closed fracture with routine healing: Secondary | ICD-10-CM | POA: Diagnosis not present

## 2015-01-31 DIAGNOSIS — N189 Chronic kidney disease, unspecified: Secondary | ICD-10-CM | POA: Diagnosis not present

## 2015-01-31 DIAGNOSIS — R700 Elevated erythrocyte sedimentation rate: Secondary | ICD-10-CM | POA: Diagnosis not present

## 2015-01-31 DIAGNOSIS — A4902 Methicillin resistant Staphylococcus aureus infection, unspecified site: Secondary | ICD-10-CM | POA: Diagnosis not present

## 2015-01-31 DIAGNOSIS — D539 Nutritional anemia, unspecified: Secondary | ICD-10-CM | POA: Diagnosis not present

## 2015-02-02 DIAGNOSIS — R05 Cough: Secondary | ICD-10-CM | POA: Diagnosis not present

## 2015-02-02 DIAGNOSIS — R062 Wheezing: Secondary | ICD-10-CM | POA: Diagnosis not present

## 2015-02-03 DIAGNOSIS — R700 Elevated erythrocyte sedimentation rate: Secondary | ICD-10-CM | POA: Diagnosis not present

## 2015-02-03 DIAGNOSIS — R05 Cough: Secondary | ICD-10-CM | POA: Diagnosis not present

## 2015-02-03 DIAGNOSIS — R062 Wheezing: Secondary | ICD-10-CM | POA: Diagnosis not present

## 2015-02-03 DIAGNOSIS — J441 Chronic obstructive pulmonary disease with (acute) exacerbation: Secondary | ICD-10-CM | POA: Diagnosis not present

## 2015-02-03 DIAGNOSIS — L7682 Other postprocedural complications of skin and subcutaneous tissue: Secondary | ICD-10-CM | POA: Diagnosis not present

## 2015-02-04 DIAGNOSIS — Z743 Need for continuous supervision: Secondary | ICD-10-CM | POA: Diagnosis not present

## 2015-02-04 DIAGNOSIS — M7989 Other specified soft tissue disorders: Secondary | ICD-10-CM | POA: Diagnosis not present

## 2015-02-04 DIAGNOSIS — G4733 Obstructive sleep apnea (adult) (pediatric): Secondary | ICD-10-CM | POA: Diagnosis not present

## 2015-02-04 DIAGNOSIS — I872 Venous insufficiency (chronic) (peripheral): Secondary | ICD-10-CM | POA: Diagnosis not present

## 2015-02-04 DIAGNOSIS — R279 Unspecified lack of coordination: Secondary | ICD-10-CM | POA: Diagnosis not present

## 2015-02-04 DIAGNOSIS — N183 Chronic kidney disease, stage 3 (moderate): Secondary | ICD-10-CM | POA: Diagnosis not present

## 2015-02-04 DIAGNOSIS — I129 Hypertensive chronic kidney disease with stage 1 through stage 4 chronic kidney disease, or unspecified chronic kidney disease: Secondary | ICD-10-CM | POA: Diagnosis not present

## 2015-02-04 DIAGNOSIS — K7460 Unspecified cirrhosis of liver: Secondary | ICD-10-CM | POA: Diagnosis not present

## 2015-02-04 DIAGNOSIS — J449 Chronic obstructive pulmonary disease, unspecified: Secondary | ICD-10-CM | POA: Diagnosis not present

## 2015-02-06 DIAGNOSIS — J441 Chronic obstructive pulmonary disease with (acute) exacerbation: Secondary | ICD-10-CM | POA: Diagnosis not present

## 2015-02-06 DIAGNOSIS — R700 Elevated erythrocyte sedimentation rate: Secondary | ICD-10-CM | POA: Diagnosis not present

## 2015-02-06 DIAGNOSIS — R05 Cough: Secondary | ICD-10-CM | POA: Diagnosis not present

## 2015-02-06 DIAGNOSIS — R062 Wheezing: Secondary | ICD-10-CM | POA: Diagnosis not present

## 2015-02-06 DIAGNOSIS — L7682 Other postprocedural complications of skin and subcutaneous tissue: Secondary | ICD-10-CM | POA: Diagnosis not present

## 2015-02-09 DIAGNOSIS — A4902 Methicillin resistant Staphylococcus aureus infection, unspecified site: Secondary | ICD-10-CM | POA: Diagnosis not present

## 2015-02-09 DIAGNOSIS — N189 Chronic kidney disease, unspecified: Secondary | ICD-10-CM | POA: Diagnosis not present

## 2015-02-09 DIAGNOSIS — D539 Nutritional anemia, unspecified: Secondary | ICD-10-CM | POA: Diagnosis not present

## 2015-02-09 DIAGNOSIS — R700 Elevated erythrocyte sedimentation rate: Secondary | ICD-10-CM | POA: Diagnosis not present

## 2015-02-13 DIAGNOSIS — A4902 Methicillin resistant Staphylococcus aureus infection, unspecified site: Secondary | ICD-10-CM | POA: Diagnosis not present

## 2015-02-13 DIAGNOSIS — N189 Chronic kidney disease, unspecified: Secondary | ICD-10-CM | POA: Diagnosis not present

## 2015-02-13 DIAGNOSIS — R700 Elevated erythrocyte sedimentation rate: Secondary | ICD-10-CM | POA: Diagnosis not present

## 2015-02-13 DIAGNOSIS — D539 Nutritional anemia, unspecified: Secondary | ICD-10-CM | POA: Diagnosis not present

## 2015-02-15 ENCOUNTER — Other Ambulatory Visit (HOSPITAL_COMMUNITY): Payer: Self-pay | Admitting: Internal Medicine

## 2015-02-15 DIAGNOSIS — D539 Nutritional anemia, unspecified: Secondary | ICD-10-CM | POA: Diagnosis not present

## 2015-02-15 DIAGNOSIS — M86172 Other acute osteomyelitis, left ankle and foot: Secondary | ICD-10-CM

## 2015-02-15 DIAGNOSIS — R700 Elevated erythrocyte sedimentation rate: Secondary | ICD-10-CM | POA: Diagnosis not present

## 2015-02-15 DIAGNOSIS — N189 Chronic kidney disease, unspecified: Secondary | ICD-10-CM | POA: Diagnosis not present

## 2015-02-15 DIAGNOSIS — A4902 Methicillin resistant Staphylococcus aureus infection, unspecified site: Secondary | ICD-10-CM | POA: Diagnosis not present

## 2015-02-18 ENCOUNTER — Encounter (HOSPITAL_COMMUNITY): Payer: Self-pay

## 2015-02-18 ENCOUNTER — Other Ambulatory Visit (HOSPITAL_COMMUNITY): Payer: Medicare Other

## 2015-02-18 ENCOUNTER — Encounter (HOSPITAL_COMMUNITY)
Admission: RE | Admit: 2015-02-18 | Discharge: 2015-02-18 | Disposition: A | Discharge status: Home / Self Care | Payer: No Typology Code available for payment source | Source: Ambulatory Visit | Attending: Internal Medicine | Admitting: Internal Medicine

## 2015-02-18 DIAGNOSIS — Z743 Need for continuous supervision: Secondary | ICD-10-CM | POA: Diagnosis not present

## 2015-02-18 DIAGNOSIS — M79672 Pain in left foot: Secondary | ICD-10-CM | POA: Diagnosis not present

## 2015-02-18 DIAGNOSIS — Z9981 Dependence on supplemental oxygen: Secondary | ICD-10-CM | POA: Diagnosis not present

## 2015-02-18 DIAGNOSIS — M25572 Pain in left ankle and joints of left foot: Secondary | ICD-10-CM | POA: Diagnosis not present

## 2015-02-18 DIAGNOSIS — R700 Elevated erythrocyte sedimentation rate: Secondary | ICD-10-CM | POA: Diagnosis not present

## 2015-02-18 DIAGNOSIS — N189 Chronic kidney disease, unspecified: Secondary | ICD-10-CM | POA: Diagnosis not present

## 2015-02-18 DIAGNOSIS — D539 Nutritional anemia, unspecified: Secondary | ICD-10-CM | POA: Diagnosis not present

## 2015-02-18 DIAGNOSIS — M86172 Other acute osteomyelitis, left ankle and foot: Secondary | ICD-10-CM

## 2015-02-18 DIAGNOSIS — A4902 Methicillin resistant Staphylococcus aureus infection, unspecified site: Secondary | ICD-10-CM | POA: Diagnosis not present

## 2015-02-18 DIAGNOSIS — R279 Unspecified lack of coordination: Secondary | ICD-10-CM | POA: Diagnosis not present

## 2015-02-18 DIAGNOSIS — Z7401 Bed confinement status: Secondary | ICD-10-CM | POA: Diagnosis not present

## 2015-02-18 HISTORY — DX: Type 2 diabetes mellitus without complications: E11.9

## 2015-02-18 HISTORY — DX: Disorder of kidney and ureter, unspecified: N28.9

## 2015-02-18 MED ORDER — TECHNETIUM TC 99M MEDRONATE IV KIT
25.0000 | PACK | Freq: Once | INTRAVENOUS | Status: AC | PRN
  Administered 2015-02-18: 25.6 via INTRAVENOUS

## 2015-02-23 DIAGNOSIS — K7030 Alcoholic cirrhosis of liver without ascites: Secondary | ICD-10-CM | POA: Diagnosis not present

## 2015-02-23 DIAGNOSIS — J9611 Chronic respiratory failure with hypoxia: Secondary | ICD-10-CM | POA: Diagnosis not present

## 2015-02-23 DIAGNOSIS — E1122 Type 2 diabetes mellitus with diabetic chronic kidney disease: Secondary | ICD-10-CM | POA: Diagnosis not present

## 2015-02-23 DIAGNOSIS — J449 Chronic obstructive pulmonary disease, unspecified: Secondary | ICD-10-CM | POA: Diagnosis not present

## 2015-02-23 DIAGNOSIS — N183 Chronic kidney disease, stage 3 (moderate): Secondary | ICD-10-CM | POA: Diagnosis not present

## 2015-02-23 DIAGNOSIS — M810 Age-related osteoporosis without current pathological fracture: Secondary | ICD-10-CM | POA: Diagnosis not present

## 2015-02-23 DIAGNOSIS — F339 Major depressive disorder, recurrent, unspecified: Secondary | ICD-10-CM | POA: Diagnosis not present

## 2015-02-23 DIAGNOSIS — Z9181 History of falling: Secondary | ICD-10-CM | POA: Diagnosis not present

## 2015-02-24 DIAGNOSIS — N189 Chronic kidney disease, unspecified: Secondary | ICD-10-CM | POA: Diagnosis not present

## 2015-02-24 DIAGNOSIS — L7682 Other postprocedural complications of skin and subcutaneous tissue: Secondary | ICD-10-CM | POA: Diagnosis not present

## 2015-02-24 DIAGNOSIS — G8929 Other chronic pain: Secondary | ICD-10-CM | POA: Diagnosis not present

## 2015-02-24 DIAGNOSIS — D539 Nutritional anemia, unspecified: Secondary | ICD-10-CM | POA: Diagnosis not present

## 2015-02-25 ENCOUNTER — Inpatient Hospital Stay (HOSPITAL_COMMUNITY): Admission: RE | Admit: 2015-02-25 | Payer: Medicare Other | Source: Ambulatory Visit

## 2015-02-25 ENCOUNTER — Other Ambulatory Visit (HOSPITAL_COMMUNITY): Payer: Self-pay | Admitting: Internal Medicine

## 2015-02-25 DIAGNOSIS — T148XXA Other injury of unspecified body region, initial encounter: Secondary | ICD-10-CM

## 2015-03-01 ENCOUNTER — Ambulatory Visit (HOSPITAL_COMMUNITY)
Admission: RE | Admit: 2015-03-01 | Discharge: 2015-03-01 | Disposition: A | Discharge status: Home / Self Care | Payer: Medicare Other | Source: Ambulatory Visit | Attending: Internal Medicine | Admitting: Internal Medicine

## 2015-03-01 DIAGNOSIS — X58XXXD Exposure to other specified factors, subsequent encounter: Secondary | ICD-10-CM | POA: Diagnosis not present

## 2015-03-01 DIAGNOSIS — S82192D Other fracture of upper end of left tibia, subsequent encounter for closed fracture with routine healing: Secondary | ICD-10-CM | POA: Diagnosis not present

## 2015-03-01 DIAGNOSIS — S72002A Fracture of unspecified part of neck of left femur, initial encounter for closed fracture: Secondary | ICD-10-CM | POA: Diagnosis not present

## 2015-03-01 DIAGNOSIS — Z743 Need for continuous supervision: Secondary | ICD-10-CM | POA: Diagnosis not present

## 2015-03-01 DIAGNOSIS — R279 Unspecified lack of coordination: Secondary | ICD-10-CM | POA: Diagnosis not present

## 2015-03-01 DIAGNOSIS — T148XXA Other injury of unspecified body region, initial encounter: Secondary | ICD-10-CM

## 2015-03-01 DIAGNOSIS — R609 Edema, unspecified: Secondary | ICD-10-CM | POA: Diagnosis not present

## 2015-03-02 DIAGNOSIS — D539 Nutritional anemia, unspecified: Secondary | ICD-10-CM | POA: Diagnosis not present

## 2015-03-02 DIAGNOSIS — L7682 Other postprocedural complications of skin and subcutaneous tissue: Secondary | ICD-10-CM | POA: Diagnosis not present

## 2015-03-02 DIAGNOSIS — A4902 Methicillin resistant Staphylococcus aureus infection, unspecified site: Secondary | ICD-10-CM | POA: Diagnosis not present

## 2015-03-02 DIAGNOSIS — N189 Chronic kidney disease, unspecified: Secondary | ICD-10-CM | POA: Diagnosis not present

## 2015-03-02 DIAGNOSIS — R700 Elevated erythrocyte sedimentation rate: Secondary | ICD-10-CM | POA: Diagnosis not present

## 2015-03-05 DIAGNOSIS — N189 Chronic kidney disease, unspecified: Secondary | ICD-10-CM | POA: Diagnosis not present

## 2015-03-05 DIAGNOSIS — D539 Nutritional anemia, unspecified: Secondary | ICD-10-CM | POA: Diagnosis not present

## 2015-03-05 DIAGNOSIS — R700 Elevated erythrocyte sedimentation rate: Secondary | ICD-10-CM | POA: Diagnosis not present

## 2015-03-05 DIAGNOSIS — L7682 Other postprocedural complications of skin and subcutaneous tissue: Secondary | ICD-10-CM | POA: Diagnosis not present

## 2015-03-05 DIAGNOSIS — A4902 Methicillin resistant Staphylococcus aureus infection, unspecified site: Secondary | ICD-10-CM | POA: Diagnosis not present

## 2015-03-11 DIAGNOSIS — R197 Diarrhea, unspecified: Secondary | ICD-10-CM | POA: Diagnosis not present

## 2015-03-11 DIAGNOSIS — M810 Age-related osteoporosis without current pathological fracture: Secondary | ICD-10-CM | POA: Diagnosis not present

## 2015-03-13 DIAGNOSIS — M7989 Other specified soft tissue disorders: Secondary | ICD-10-CM | POA: Diagnosis not present

## 2015-03-15 DIAGNOSIS — R2230 Localized swelling, mass and lump, unspecified upper limb: Secondary | ICD-10-CM | POA: Diagnosis not present

## 2015-03-15 DIAGNOSIS — M79602 Pain in left arm: Secondary | ICD-10-CM | POA: Diagnosis not present

## 2015-03-17 DIAGNOSIS — A4902 Methicillin resistant Staphylococcus aureus infection, unspecified site: Secondary | ICD-10-CM | POA: Diagnosis not present

## 2015-03-17 DIAGNOSIS — J441 Chronic obstructive pulmonary disease with (acute) exacerbation: Secondary | ICD-10-CM | POA: Diagnosis not present

## 2015-03-17 DIAGNOSIS — R700 Elevated erythrocyte sedimentation rate: Secondary | ICD-10-CM | POA: Diagnosis not present

## 2015-03-17 DIAGNOSIS — D539 Nutritional anemia, unspecified: Secondary | ICD-10-CM | POA: Diagnosis not present

## 2015-03-17 DIAGNOSIS — L7682 Other postprocedural complications of skin and subcutaneous tissue: Secondary | ICD-10-CM | POA: Diagnosis not present

## 2015-03-17 NOTE — Addendum Note (Signed)
Encounter addended by: Gerre Couch, RN on: 03/17/2015  1:39 PM<BR>     Documentation filed: Arn Medal VN

## 2015-03-21 DIAGNOSIS — A4902 Methicillin resistant Staphylococcus aureus infection, unspecified site: Secondary | ICD-10-CM | POA: Diagnosis not present

## 2015-03-21 DIAGNOSIS — R700 Elevated erythrocyte sedimentation rate: Secondary | ICD-10-CM | POA: Diagnosis not present

## 2015-03-21 DIAGNOSIS — L7682 Other postprocedural complications of skin and subcutaneous tissue: Secondary | ICD-10-CM | POA: Diagnosis not present

## 2015-03-21 DIAGNOSIS — J441 Chronic obstructive pulmonary disease with (acute) exacerbation: Secondary | ICD-10-CM | POA: Diagnosis not present

## 2015-03-21 DIAGNOSIS — D539 Nutritional anemia, unspecified: Secondary | ICD-10-CM | POA: Diagnosis not present

## 2015-03-22 DIAGNOSIS — N189 Chronic kidney disease, unspecified: Secondary | ICD-10-CM | POA: Diagnosis not present

## 2015-03-22 DIAGNOSIS — R0789 Other chest pain: Secondary | ICD-10-CM | POA: Diagnosis not present

## 2015-03-22 DIAGNOSIS — R700 Elevated erythrocyte sedimentation rate: Secondary | ICD-10-CM | POA: Diagnosis not present

## 2015-03-22 DIAGNOSIS — D539 Nutritional anemia, unspecified: Secondary | ICD-10-CM | POA: Diagnosis not present

## 2015-03-22 DIAGNOSIS — D62 Acute posthemorrhagic anemia: Secondary | ICD-10-CM | POA: Diagnosis not present

## 2015-03-23 DIAGNOSIS — D62 Acute posthemorrhagic anemia: Secondary | ICD-10-CM | POA: Diagnosis not present

## 2015-03-23 DIAGNOSIS — R700 Elevated erythrocyte sedimentation rate: Secondary | ICD-10-CM | POA: Diagnosis not present

## 2015-03-23 DIAGNOSIS — N189 Chronic kidney disease, unspecified: Secondary | ICD-10-CM | POA: Diagnosis not present

## 2015-03-23 DIAGNOSIS — R0789 Other chest pain: Secondary | ICD-10-CM | POA: Diagnosis not present

## 2015-03-23 DIAGNOSIS — D539 Nutritional anemia, unspecified: Secondary | ICD-10-CM | POA: Diagnosis not present

## 2015-03-26 DIAGNOSIS — S82832D Other fracture of upper and lower end of left fibula, subsequent encounter for closed fracture with routine healing: Secondary | ICD-10-CM | POA: Diagnosis not present

## 2015-03-26 DIAGNOSIS — S82142D Displaced bicondylar fracture of left tibia, subsequent encounter for closed fracture with routine healing: Secondary | ICD-10-CM | POA: Diagnosis not present

## 2015-03-26 DIAGNOSIS — Z9889 Other specified postprocedural states: Secondary | ICD-10-CM | POA: Diagnosis not present

## 2015-03-26 DIAGNOSIS — Z880 Allergy status to penicillin: Secondary | ICD-10-CM | POA: Diagnosis not present

## 2015-03-26 DIAGNOSIS — I129 Hypertensive chronic kidney disease with stage 1 through stage 4 chronic kidney disease, or unspecified chronic kidney disease: Secondary | ICD-10-CM | POA: Diagnosis not present

## 2015-03-26 DIAGNOSIS — Z87891 Personal history of nicotine dependence: Secondary | ICD-10-CM | POA: Diagnosis not present

## 2015-03-26 DIAGNOSIS — N183 Chronic kidney disease, stage 3 (moderate): Secondary | ICD-10-CM | POA: Diagnosis not present

## 2015-03-26 DIAGNOSIS — R05 Cough: Secondary | ICD-10-CM | POA: Diagnosis not present

## 2015-03-26 DIAGNOSIS — Z79899 Other long term (current) drug therapy: Secondary | ICD-10-CM | POA: Diagnosis not present

## 2015-03-26 DIAGNOSIS — Z7901 Long term (current) use of anticoagulants: Secondary | ICD-10-CM | POA: Diagnosis not present

## 2015-03-26 DIAGNOSIS — E1122 Type 2 diabetes mellitus with diabetic chronic kidney disease: Secondary | ICD-10-CM | POA: Diagnosis not present

## 2015-03-29 DIAGNOSIS — M6281 Muscle weakness (generalized): Secondary | ICD-10-CM | POA: Diagnosis not present

## 2015-03-29 DIAGNOSIS — D649 Anemia, unspecified: Secondary | ICD-10-CM | POA: Diagnosis not present

## 2015-03-30 DIAGNOSIS — M6281 Muscle weakness (generalized): Secondary | ICD-10-CM | POA: Diagnosis not present

## 2015-03-30 DIAGNOSIS — S86992A Other injury of unspecified muscle(s) and tendon(s) at lower leg level, left leg, initial encounter: Secondary | ICD-10-CM | POA: Diagnosis not present

## 2015-03-30 DIAGNOSIS — I10 Essential (primary) hypertension: Secondary | ICD-10-CM | POA: Diagnosis not present

## 2015-03-30 DIAGNOSIS — G8929 Other chronic pain: Secondary | ICD-10-CM | POA: Diagnosis not present

## 2015-03-30 DIAGNOSIS — R6889 Other general symptoms and signs: Secondary | ICD-10-CM | POA: Diagnosis not present

## 2015-03-30 DIAGNOSIS — N183 Chronic kidney disease, stage 3 (moderate): Secondary | ICD-10-CM | POA: Diagnosis not present

## 2015-03-30 DIAGNOSIS — D649 Anemia, unspecified: Secondary | ICD-10-CM | POA: Diagnosis not present

## 2015-03-31 DIAGNOSIS — M6281 Muscle weakness (generalized): Secondary | ICD-10-CM | POA: Diagnosis not present

## 2015-03-31 DIAGNOSIS — D649 Anemia, unspecified: Secondary | ICD-10-CM | POA: Diagnosis not present

## 2015-04-01 DIAGNOSIS — M6281 Muscle weakness (generalized): Secondary | ICD-10-CM | POA: Diagnosis not present

## 2015-04-01 DIAGNOSIS — D649 Anemia, unspecified: Secondary | ICD-10-CM | POA: Diagnosis not present

## 2015-04-02 DIAGNOSIS — D649 Anemia, unspecified: Secondary | ICD-10-CM | POA: Diagnosis not present

## 2015-04-02 DIAGNOSIS — M6281 Muscle weakness (generalized): Secondary | ICD-10-CM | POA: Diagnosis not present

## 2015-04-05 DIAGNOSIS — D649 Anemia, unspecified: Secondary | ICD-10-CM | POA: Diagnosis not present

## 2015-04-05 DIAGNOSIS — M6281 Muscle weakness (generalized): Secondary | ICD-10-CM | POA: Diagnosis not present

## 2015-04-06 DIAGNOSIS — R6889 Other general symptoms and signs: Secondary | ICD-10-CM | POA: Diagnosis not present

## 2015-04-06 DIAGNOSIS — D649 Anemia, unspecified: Secondary | ICD-10-CM | POA: Diagnosis not present

## 2015-04-06 DIAGNOSIS — M6281 Muscle weakness (generalized): Secondary | ICD-10-CM | POA: Diagnosis not present

## 2015-04-07 DIAGNOSIS — M6281 Muscle weakness (generalized): Secondary | ICD-10-CM | POA: Diagnosis not present

## 2015-04-07 DIAGNOSIS — D649 Anemia, unspecified: Secondary | ICD-10-CM | POA: Diagnosis not present

## 2015-04-07 DIAGNOSIS — I10 Essential (primary) hypertension: Secondary | ICD-10-CM | POA: Diagnosis not present

## 2015-04-07 DIAGNOSIS — S82202A Unspecified fracture of shaft of left tibia, initial encounter for closed fracture: Secondary | ICD-10-CM | POA: Diagnosis not present

## 2015-04-07 DIAGNOSIS — N183 Chronic kidney disease, stage 3 (moderate): Secondary | ICD-10-CM | POA: Diagnosis not present

## 2015-04-07 DIAGNOSIS — S86992A Other injury of unspecified muscle(s) and tendon(s) at lower leg level, left leg, initial encounter: Secondary | ICD-10-CM | POA: Diagnosis not present

## 2015-04-08 DIAGNOSIS — M6281 Muscle weakness (generalized): Secondary | ICD-10-CM | POA: Diagnosis not present

## 2015-04-08 DIAGNOSIS — D649 Anemia, unspecified: Secondary | ICD-10-CM | POA: Diagnosis not present

## 2015-04-09 DIAGNOSIS — M6281 Muscle weakness (generalized): Secondary | ICD-10-CM | POA: Diagnosis not present

## 2015-04-09 DIAGNOSIS — D649 Anemia, unspecified: Secondary | ICD-10-CM | POA: Diagnosis not present

## 2015-04-12 DIAGNOSIS — M6281 Muscle weakness (generalized): Secondary | ICD-10-CM | POA: Diagnosis not present

## 2015-04-12 DIAGNOSIS — D649 Anemia, unspecified: Secondary | ICD-10-CM | POA: Diagnosis not present

## 2015-04-13 DIAGNOSIS — D649 Anemia, unspecified: Secondary | ICD-10-CM | POA: Diagnosis not present

## 2015-04-13 DIAGNOSIS — M6281 Muscle weakness (generalized): Secondary | ICD-10-CM | POA: Diagnosis not present

## 2015-04-13 DIAGNOSIS — R6889 Other general symptoms and signs: Secondary | ICD-10-CM | POA: Diagnosis not present

## 2015-04-14 DIAGNOSIS — M6281 Muscle weakness (generalized): Secondary | ICD-10-CM | POA: Diagnosis not present

## 2015-04-14 DIAGNOSIS — D649 Anemia, unspecified: Secondary | ICD-10-CM | POA: Diagnosis not present

## 2015-04-15 DIAGNOSIS — E785 Hyperlipidemia, unspecified: Secondary | ICD-10-CM | POA: Diagnosis not present

## 2015-04-15 DIAGNOSIS — G4733 Obstructive sleep apnea (adult) (pediatric): Secondary | ICD-10-CM | POA: Diagnosis not present

## 2015-04-15 DIAGNOSIS — R600 Localized edema: Secondary | ICD-10-CM | POA: Diagnosis not present

## 2015-04-15 DIAGNOSIS — R718 Other abnormality of red blood cells: Secondary | ICD-10-CM | POA: Diagnosis not present

## 2015-04-15 DIAGNOSIS — M879 Osteonecrosis, unspecified: Secondary | ICD-10-CM | POA: Diagnosis not present

## 2015-04-15 DIAGNOSIS — G8929 Other chronic pain: Secondary | ICD-10-CM | POA: Diagnosis not present

## 2015-04-15 DIAGNOSIS — M25561 Pain in right knee: Secondary | ICD-10-CM | POA: Diagnosis not present

## 2015-04-15 DIAGNOSIS — R700 Elevated erythrocyte sedimentation rate: Secondary | ICD-10-CM | POA: Diagnosis not present

## 2015-04-15 DIAGNOSIS — J449 Chronic obstructive pulmonary disease, unspecified: Secondary | ICD-10-CM | POA: Diagnosis not present

## 2015-04-15 DIAGNOSIS — J9611 Chronic respiratory failure with hypoxia: Secondary | ICD-10-CM | POA: Diagnosis not present

## 2015-04-15 DIAGNOSIS — E119 Type 2 diabetes mellitus without complications: Secondary | ICD-10-CM | POA: Diagnosis not present

## 2015-04-15 DIAGNOSIS — I10 Essential (primary) hypertension: Secondary | ICD-10-CM | POA: Diagnosis not present

## 2015-04-15 DIAGNOSIS — K219 Gastro-esophageal reflux disease without esophagitis: Secondary | ICD-10-CM | POA: Diagnosis not present

## 2015-04-15 DIAGNOSIS — L8990 Pressure ulcer of unspecified site, unspecified stage: Secondary | ICD-10-CM | POA: Diagnosis not present

## 2015-04-15 DIAGNOSIS — A499 Bacterial infection, unspecified: Secondary | ICD-10-CM | POA: Diagnosis not present

## 2015-04-15 DIAGNOSIS — S82142D Displaced bicondylar fracture of left tibia, subsequent encounter for closed fracture with routine healing: Secondary | ICD-10-CM | POA: Diagnosis not present

## 2015-04-15 DIAGNOSIS — D696 Thrombocytopenia, unspecified: Secondary | ICD-10-CM | POA: Diagnosis not present

## 2015-04-15 DIAGNOSIS — D62 Acute posthemorrhagic anemia: Secondary | ICD-10-CM | POA: Diagnosis not present

## 2015-04-15 DIAGNOSIS — R262 Difficulty in walking, not elsewhere classified: Secondary | ICD-10-CM | POA: Diagnosis not present

## 2015-04-15 DIAGNOSIS — R6889 Other general symptoms and signs: Secondary | ICD-10-CM | POA: Diagnosis not present

## 2015-04-15 DIAGNOSIS — K9189 Other postprocedural complications and disorders of digestive system: Secondary | ICD-10-CM | POA: Diagnosis not present

## 2015-04-15 DIAGNOSIS — M1711 Unilateral primary osteoarthritis, right knee: Secondary | ICD-10-CM | POA: Diagnosis not present

## 2015-04-15 DIAGNOSIS — R2681 Unsteadiness on feet: Secondary | ICD-10-CM | POA: Diagnosis not present

## 2015-04-15 DIAGNOSIS — F339 Major depressive disorder, recurrent, unspecified: Secondary | ICD-10-CM | POA: Diagnosis not present

## 2015-04-15 DIAGNOSIS — F319 Bipolar disorder, unspecified: Secondary | ICD-10-CM | POA: Diagnosis not present

## 2015-04-15 DIAGNOSIS — M6281 Muscle weakness (generalized): Secondary | ICD-10-CM | POA: Diagnosis not present

## 2015-04-15 DIAGNOSIS — R55 Syncope and collapse: Secondary | ICD-10-CM | POA: Diagnosis not present

## 2015-04-15 DIAGNOSIS — S82832D Other fracture of upper and lower end of left fibula, subsequent encounter for closed fracture with routine healing: Secondary | ICD-10-CM | POA: Diagnosis not present

## 2015-04-15 DIAGNOSIS — T79A22S Traumatic compartment syndrome of left lower extremity, sequela: Secondary | ICD-10-CM | POA: Diagnosis not present

## 2015-04-15 DIAGNOSIS — D649 Anemia, unspecified: Secondary | ICD-10-CM | POA: Diagnosis not present

## 2015-04-15 DIAGNOSIS — K7460 Unspecified cirrhosis of liver: Secondary | ICD-10-CM | POA: Diagnosis not present

## 2015-04-15 DIAGNOSIS — G4700 Insomnia, unspecified: Secondary | ICD-10-CM | POA: Diagnosis not present

## 2015-04-15 DIAGNOSIS — M869 Osteomyelitis, unspecified: Secondary | ICD-10-CM | POA: Diagnosis present

## 2015-04-15 DIAGNOSIS — N183 Chronic kidney disease, stage 3 (moderate): Secondary | ICD-10-CM | POA: Diagnosis not present

## 2015-04-16 DIAGNOSIS — D649 Anemia, unspecified: Secondary | ICD-10-CM | POA: Diagnosis not present

## 2015-04-16 DIAGNOSIS — M6281 Muscle weakness (generalized): Secondary | ICD-10-CM | POA: Diagnosis not present

## 2015-04-19 DIAGNOSIS — D649 Anemia, unspecified: Secondary | ICD-10-CM | POA: Diagnosis not present

## 2015-04-19 DIAGNOSIS — M6281 Muscle weakness (generalized): Secondary | ICD-10-CM | POA: Diagnosis not present

## 2015-04-20 DIAGNOSIS — D649 Anemia, unspecified: Secondary | ICD-10-CM | POA: Diagnosis not present

## 2015-04-20 DIAGNOSIS — R6889 Other general symptoms and signs: Secondary | ICD-10-CM | POA: Diagnosis not present

## 2015-04-20 DIAGNOSIS — M6281 Muscle weakness (generalized): Secondary | ICD-10-CM | POA: Diagnosis not present

## 2015-04-21 DIAGNOSIS — M6281 Muscle weakness (generalized): Secondary | ICD-10-CM | POA: Diagnosis not present

## 2015-04-21 DIAGNOSIS — D649 Anemia, unspecified: Secondary | ICD-10-CM | POA: Diagnosis not present

## 2015-04-22 DIAGNOSIS — M6281 Muscle weakness (generalized): Secondary | ICD-10-CM | POA: Diagnosis not present

## 2015-04-22 DIAGNOSIS — D649 Anemia, unspecified: Secondary | ICD-10-CM | POA: Diagnosis not present

## 2015-04-23 DIAGNOSIS — D649 Anemia, unspecified: Secondary | ICD-10-CM | POA: Diagnosis not present

## 2015-04-23 DIAGNOSIS — M6281 Muscle weakness (generalized): Secondary | ICD-10-CM | POA: Diagnosis not present

## 2015-04-26 DIAGNOSIS — M6281 Muscle weakness (generalized): Secondary | ICD-10-CM | POA: Diagnosis not present

## 2015-04-26 DIAGNOSIS — D649 Anemia, unspecified: Secondary | ICD-10-CM | POA: Diagnosis not present

## 2015-04-27 DIAGNOSIS — D649 Anemia, unspecified: Secondary | ICD-10-CM | POA: Diagnosis not present

## 2015-04-27 DIAGNOSIS — M6281 Muscle weakness (generalized): Secondary | ICD-10-CM | POA: Diagnosis not present

## 2015-04-28 DIAGNOSIS — M6281 Muscle weakness (generalized): Secondary | ICD-10-CM | POA: Diagnosis not present

## 2015-04-28 DIAGNOSIS — D649 Anemia, unspecified: Secondary | ICD-10-CM | POA: Diagnosis not present

## 2015-04-29 DIAGNOSIS — D649 Anemia, unspecified: Secondary | ICD-10-CM | POA: Diagnosis not present

## 2015-04-29 DIAGNOSIS — M6281 Muscle weakness (generalized): Secondary | ICD-10-CM | POA: Diagnosis not present

## 2015-04-30 DIAGNOSIS — D649 Anemia, unspecified: Secondary | ICD-10-CM | POA: Diagnosis not present

## 2015-04-30 DIAGNOSIS — M6281 Muscle weakness (generalized): Secondary | ICD-10-CM | POA: Diagnosis not present

## 2015-05-03 ENCOUNTER — Ambulatory Visit (INDEPENDENT_AMBULATORY_CARE_PROVIDER_SITE_OTHER): Payer: Medicare Other | Admitting: Internal Medicine

## 2015-05-03 ENCOUNTER — Encounter: Payer: Self-pay | Admitting: Internal Medicine

## 2015-05-03 VITALS — BP 126/72 | HR 57 | Temp 98.8°F

## 2015-05-03 DIAGNOSIS — M869 Osteomyelitis, unspecified: Secondary | ICD-10-CM

## 2015-05-03 DIAGNOSIS — R6889 Other general symptoms and signs: Secondary | ICD-10-CM | POA: Diagnosis not present

## 2015-05-03 DIAGNOSIS — D649 Anemia, unspecified: Secondary | ICD-10-CM | POA: Diagnosis not present

## 2015-05-03 DIAGNOSIS — M6281 Muscle weakness (generalized): Secondary | ICD-10-CM | POA: Diagnosis not present

## 2015-05-03 LAB — BASIC METABOLIC PANEL
BUN: 14 mg/dL (ref 7–25)
CALCIUM: 8.6 mg/dL (ref 8.6–10.3)
CO2: 37 mmol/L — AB (ref 20–31)
Chloride: 97 mmol/L — ABNORMAL LOW (ref 98–110)
Creat: 1.13 mg/dL (ref 0.70–1.33)
Glucose, Bld: 120 mg/dL — ABNORMAL HIGH (ref 65–99)
Potassium: 4.4 mmol/L (ref 3.5–5.3)
SODIUM: 141 mmol/L (ref 135–146)

## 2015-05-03 LAB — CBC WITH DIFFERENTIAL/PLATELET
BASOS ABS: 0 10*3/uL (ref 0.0–0.1)
BASOS PCT: 1 % (ref 0–1)
Eosinophils Absolute: 0.1 10*3/uL (ref 0.0–0.7)
Eosinophils Relative: 2 % (ref 0–5)
HCT: 34.9 % — ABNORMAL LOW (ref 39.0–52.0)
HEMOGLOBIN: 11.3 g/dL — AB (ref 13.0–17.0)
Lymphocytes Relative: 14 % (ref 12–46)
Lymphs Abs: 0.6 10*3/uL — ABNORMAL LOW (ref 0.7–4.0)
MCH: 31.2 pg (ref 26.0–34.0)
MCHC: 32.4 g/dL (ref 30.0–36.0)
MCV: 96.4 fL (ref 78.0–100.0)
MONOS PCT: 8 % (ref 3–12)
MPV: 9.3 fL (ref 8.6–12.4)
Monocytes Absolute: 0.3 10*3/uL (ref 0.1–1.0)
NEUTROS ABS: 3 10*3/uL (ref 1.7–7.7)
NEUTROS PCT: 75 % (ref 43–77)
PLATELETS: 119 10*3/uL — AB (ref 150–400)
RBC: 3.62 MIL/uL — AB (ref 4.22–5.81)
RDW: 14.4 % (ref 11.5–15.5)
WBC: 4 10*3/uL (ref 4.0–10.5)

## 2015-05-03 LAB — C-REACTIVE PROTEIN: CRP: 2.4 mg/dL — AB (ref <0.60)

## 2015-05-03 MED ORDER — DOXYCYCLINE HYCLATE 100 MG PO TABS
100.0000 mg | ORAL_TABLET | Freq: Two times a day (BID) | ORAL | Status: DC
Start: 2015-05-03 — End: 2015-07-16

## 2015-05-03 NOTE — Progress Notes (Deleted)
Patient ID: Jorge [name] [facility], male   DOB: Oct 11, 1965, 49 y.o.   MRN: [id number]

## 2015-05-03 NOTE — Progress Notes (Signed)
Subjective:    Patient ID: Jorge [name] [facility], male    DOB: 1965-08-26, 49 y.o.   MRN: [id number]  HPI 49yo M with COPD on supp O2, CKD, cirrhosis c/b hepatic encephalopathy. He sustained left tib fracture requiring hardware fixation, c/b compartment syndrome s/p fasciotomy. He was treated for deep tissue infection/ osteomyelitis with 8 wk of vancomycin and clindamycin. 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 He finished IV therapy but does not appear to have been placed on further suppressive antibiotics. He has had leg CT in October which showed HW intact. His sed rate 111. His leg is well healed, still has pain, swelling to his leg. He is still deconditioned from significant hospitalization late summer/fall. Allergies  Allergen Reactions  . Penicillins Anaphylaxis    Patient states he is not allergic to anything;  Pt says it's his twin brother that has this allergy.   Active Ambulatory Problems    Diagnosis Date Noted  . COPD exacerbation ([place]) 08/29/2014  . Chest pain 08/29/2014  . Cirrhosis ([place])   . Hypertension   . CKD (chronic kidney disease) 08/29/2014  . Acute respiratory failure with hypoxia ([place]) 08/29/2014  . Pain in the chest   . Essential hypertension   . Insomnia 10/03/2014  . Chronic pain 10/03/2014  . GERD without esophagitis 10/03/2014  . Chronic obstructive pulmonary disease with acute exacerbation ([place])   . Renal insufficiency   . Sleep apnea   . Tobacco abuse   . Thrombocytopenia ([place]) 10/27/2014  . Hyperglycemia 10/27/2014  . COPD with exacerbation ([place]) 10/27/2014  . Acute respiratory failure ([place]) 10/27/2014  . Anemia 12/21/2014  . Pressure ulcer 12/23/2014  . Syncope 12/23/2014   Resolved Ambulatory Problems    Diagnosis Date Noted  . No Resolved Ambulatory Problems   Past Medical History  Diagnosis Date  . COPD (chronic obstructive pulmonary disease) ([place])   . Renal disorder   . Suicide attempt ([place])   . Diabetes mellitus  without complication [facility])    Social History  Substance Use Topics  . Smoking status: Current Every Day Smoker -- 0.50 packs/day    Types: Cigarettes  . Smokeless tobacco: None  . Alcohol Use: No  family history includes Cancer in his mother; Heart disease in his father.  Review of Systems  Easily deconditioned with shortness of breath with exertion. Left leg swelling unchanged. Left leg pain. 10 point ros is otherwise negative     Objective:   Physical Exam BP 126/72 mmHg  Pulse 57  Temp(Src) 98.8 F (37.1 C) (Oral) Physical Exam  Constitutional: He is oriented to person, place, and time. He is wearing supplemental oxygen. Morbid obesity. No distress. Chronically ill appearing HENT:  Mouth/Throat: Oropharynx is clear and moist. No oropharyngeal exudate.  Cardiovascular: Normal rate, regular rhythm and normal heart sounds. Exam reveals no gallop and no friction rub.  No murmur heard.  Pulmonary/Chest: Effort normal and breath sounds normal. No respiratory distress. He has no wheezes.  Abdominal: Soft. Bowel sounds are normal. He exhibits no distension. There is no tenderness.  Lymphadenopathy:  He has no cervical adenopathy.  Neurological: He is alert and oriented to person, place, and time.  Skin: Skin is warm and dry. Hyperpigmentation bilateral lower extremity, venous stasis changes. Skin graft to left calf. Psychiatric: He has a normal mood and affect. His behavior is normal.        Assessment & Plan:  Left leg osteomyelitis = will check cbc  with diff, bmp, and sed rate and crp. Will start on doxycycline 100mg  bid x 2 months as empiric trx for chronic osteo.  Health maintenance = will check hiv and hep c

## 2015-05-04 DIAGNOSIS — M6281 Muscle weakness (generalized): Secondary | ICD-10-CM | POA: Diagnosis not present

## 2015-05-04 DIAGNOSIS — D649 Anemia, unspecified: Secondary | ICD-10-CM | POA: Diagnosis not present

## 2015-05-04 LAB — HEPATITIS C ANTIBODY: HCV AB: NEGATIVE

## 2015-05-04 LAB — SEDIMENTATION RATE: SED RATE: 82 mm/h — AB (ref 0–20)

## 2015-05-05 DIAGNOSIS — M6281 Muscle weakness (generalized): Secondary | ICD-10-CM | POA: Diagnosis not present

## 2015-05-05 DIAGNOSIS — D649 Anemia, unspecified: Secondary | ICD-10-CM | POA: Diagnosis not present

## 2015-05-06 DIAGNOSIS — M6281 Muscle weakness (generalized): Secondary | ICD-10-CM | POA: Diagnosis not present

## 2015-05-06 DIAGNOSIS — D649 Anemia, unspecified: Secondary | ICD-10-CM | POA: Diagnosis not present

## 2015-05-07 DIAGNOSIS — D649 Anemia, unspecified: Secondary | ICD-10-CM | POA: Diagnosis not present

## 2015-05-07 DIAGNOSIS — M6281 Muscle weakness (generalized): Secondary | ICD-10-CM | POA: Diagnosis not present

## 2015-05-10 DIAGNOSIS — M6281 Muscle weakness (generalized): Secondary | ICD-10-CM | POA: Diagnosis not present

## 2015-05-10 DIAGNOSIS — D649 Anemia, unspecified: Secondary | ICD-10-CM | POA: Diagnosis not present

## 2015-05-11 DIAGNOSIS — M6281 Muscle weakness (generalized): Secondary | ICD-10-CM | POA: Diagnosis not present

## 2015-05-11 DIAGNOSIS — D649 Anemia, unspecified: Secondary | ICD-10-CM | POA: Diagnosis not present

## 2015-05-12 DIAGNOSIS — M6281 Muscle weakness (generalized): Secondary | ICD-10-CM | POA: Diagnosis not present

## 2015-05-12 DIAGNOSIS — D649 Anemia, unspecified: Secondary | ICD-10-CM | POA: Diagnosis not present

## 2015-05-13 DIAGNOSIS — M6281 Muscle weakness (generalized): Secondary | ICD-10-CM | POA: Diagnosis not present

## 2015-05-13 DIAGNOSIS — M1711 Unilateral primary osteoarthritis, right knee: Secondary | ICD-10-CM | POA: Diagnosis not present

## 2015-05-13 DIAGNOSIS — D649 Anemia, unspecified: Secondary | ICD-10-CM | POA: Diagnosis not present

## 2015-05-14 DIAGNOSIS — M25561 Pain in right knee: Secondary | ICD-10-CM | POA: Diagnosis not present

## 2015-05-14 DIAGNOSIS — D649 Anemia, unspecified: Secondary | ICD-10-CM | POA: Diagnosis not present

## 2015-05-14 DIAGNOSIS — N183 Chronic kidney disease, stage 3 (moderate): Secondary | ICD-10-CM | POA: Diagnosis not present

## 2015-05-14 DIAGNOSIS — M6281 Muscle weakness (generalized): Secondary | ICD-10-CM | POA: Diagnosis not present

## 2015-05-14 DIAGNOSIS — E119 Type 2 diabetes mellitus without complications: Secondary | ICD-10-CM | POA: Diagnosis not present

## 2015-05-14 DIAGNOSIS — R700 Elevated erythrocyte sedimentation rate: Secondary | ICD-10-CM | POA: Diagnosis not present

## 2015-05-14 DIAGNOSIS — I10 Essential (primary) hypertension: Secondary | ICD-10-CM | POA: Diagnosis not present

## 2015-05-14 DIAGNOSIS — E785 Hyperlipidemia, unspecified: Secondary | ICD-10-CM | POA: Diagnosis not present

## 2015-05-16 DIAGNOSIS — R600 Localized edema: Secondary | ICD-10-CM | POA: Diagnosis not present

## 2015-05-16 DIAGNOSIS — J449 Chronic obstructive pulmonary disease, unspecified: Secondary | ICD-10-CM | POA: Diagnosis not present

## 2015-05-16 DIAGNOSIS — D696 Thrombocytopenia, unspecified: Secondary | ICD-10-CM | POA: Diagnosis not present

## 2015-05-16 DIAGNOSIS — M25561 Pain in right knee: Secondary | ICD-10-CM | POA: Diagnosis not present

## 2015-05-16 DIAGNOSIS — N183 Chronic kidney disease, stage 3 (moderate): Secondary | ICD-10-CM | POA: Diagnosis not present

## 2015-05-16 DIAGNOSIS — G8929 Other chronic pain: Secondary | ICD-10-CM | POA: Diagnosis not present

## 2015-05-16 DIAGNOSIS — J9611 Chronic respiratory failure with hypoxia: Secondary | ICD-10-CM | POA: Diagnosis not present

## 2015-05-16 DIAGNOSIS — S82142D Displaced bicondylar fracture of left tibia, subsequent encounter for closed fracture with routine healing: Secondary | ICD-10-CM | POA: Diagnosis not present

## 2015-05-16 DIAGNOSIS — D62 Acute posthemorrhagic anemia: Secondary | ICD-10-CM | POA: Diagnosis not present

## 2015-05-16 DIAGNOSIS — R262 Difficulty in walking, not elsewhere classified: Secondary | ICD-10-CM | POA: Diagnosis not present

## 2015-05-16 DIAGNOSIS — D649 Anemia, unspecified: Secondary | ICD-10-CM | POA: Diagnosis not present

## 2015-05-16 DIAGNOSIS — M6281 Muscle weakness (generalized): Secondary | ICD-10-CM | POA: Diagnosis not present

## 2015-05-16 DIAGNOSIS — M879 Osteonecrosis, unspecified: Secondary | ICD-10-CM | POA: Diagnosis not present

## 2015-05-16 DIAGNOSIS — R55 Syncope and collapse: Secondary | ICD-10-CM | POA: Diagnosis not present

## 2015-05-16 DIAGNOSIS — E119 Type 2 diabetes mellitus without complications: Secondary | ICD-10-CM | POA: Diagnosis not present

## 2015-05-16 DIAGNOSIS — K219 Gastro-esophageal reflux disease without esophagitis: Secondary | ICD-10-CM | POA: Diagnosis not present

## 2015-05-16 DIAGNOSIS — T79A22S Traumatic compartment syndrome of left lower extremity, sequela: Secondary | ICD-10-CM | POA: Diagnosis not present

## 2015-05-16 DIAGNOSIS — G4733 Obstructive sleep apnea (adult) (pediatric): Secondary | ICD-10-CM | POA: Diagnosis not present

## 2015-05-16 DIAGNOSIS — F319 Bipolar disorder, unspecified: Secondary | ICD-10-CM | POA: Diagnosis not present

## 2015-05-16 DIAGNOSIS — G4700 Insomnia, unspecified: Secondary | ICD-10-CM | POA: Diagnosis not present

## 2015-05-16 DIAGNOSIS — A499 Bacterial infection, unspecified: Secondary | ICD-10-CM | POA: Diagnosis not present

## 2015-05-16 DIAGNOSIS — F339 Major depressive disorder, recurrent, unspecified: Secondary | ICD-10-CM | POA: Diagnosis not present

## 2015-05-16 DIAGNOSIS — R718 Other abnormality of red blood cells: Secondary | ICD-10-CM | POA: Diagnosis not present

## 2015-05-16 DIAGNOSIS — K7460 Unspecified cirrhosis of liver: Secondary | ICD-10-CM | POA: Diagnosis not present

## 2015-05-16 DIAGNOSIS — S82832D Other fracture of upper and lower end of left fibula, subsequent encounter for closed fracture with routine healing: Secondary | ICD-10-CM | POA: Diagnosis not present

## 2015-05-16 DIAGNOSIS — L8990 Pressure ulcer of unspecified site, unspecified stage: Secondary | ICD-10-CM | POA: Diagnosis not present

## 2015-05-16 DIAGNOSIS — R2681 Unsteadiness on feet: Secondary | ICD-10-CM | POA: Diagnosis not present

## 2015-05-16 DIAGNOSIS — K9189 Other postprocedural complications and disorders of digestive system: Secondary | ICD-10-CM | POA: Diagnosis not present

## 2015-06-07 DIAGNOSIS — M25561 Pain in right knee: Secondary | ICD-10-CM | POA: Diagnosis not present

## 2015-06-07 DIAGNOSIS — M879 Osteonecrosis, unspecified: Secondary | ICD-10-CM | POA: Diagnosis not present

## 2015-06-16 DIAGNOSIS — D649 Anemia, unspecified: Secondary | ICD-10-CM | POA: Diagnosis not present

## 2015-06-16 DIAGNOSIS — R262 Difficulty in walking, not elsewhere classified: Secondary | ICD-10-CM | POA: Diagnosis not present

## 2015-06-16 DIAGNOSIS — R2681 Unsteadiness on feet: Secondary | ICD-10-CM | POA: Diagnosis not present

## 2015-06-16 DIAGNOSIS — S82142D Displaced bicondylar fracture of left tibia, subsequent encounter for closed fracture with routine healing: Secondary | ICD-10-CM | POA: Diagnosis not present

## 2015-06-16 DIAGNOSIS — M6281 Muscle weakness (generalized): Secondary | ICD-10-CM | POA: Diagnosis not present

## 2015-06-16 DIAGNOSIS — S82832D Other fracture of upper and lower end of left fibula, subsequent encounter for closed fracture with routine healing: Secondary | ICD-10-CM | POA: Diagnosis not present

## 2015-07-06 ENCOUNTER — Ambulatory Visit: Payer: Medicare Other | Admitting: Internal Medicine

## 2015-07-12 DIAGNOSIS — D649 Anemia, unspecified: Secondary | ICD-10-CM | POA: Diagnosis not present

## 2015-07-12 DIAGNOSIS — A499 Bacterial infection, unspecified: Secondary | ICD-10-CM | POA: Diagnosis not present

## 2015-07-12 DIAGNOSIS — R0682 Tachypnea, not elsewhere classified: Secondary | ICD-10-CM | POA: Diagnosis not present

## 2015-07-13 ENCOUNTER — Ambulatory Visit (INDEPENDENT_AMBULATORY_CARE_PROVIDER_SITE_OTHER): Payer: Medicare Other | Admitting: Internal Medicine

## 2015-07-13 VITALS — BP 109/70 | HR 62 | Temp 98.3°F | Ht 75.0 in | Wt 359.8 lb

## 2015-07-13 DIAGNOSIS — M869 Osteomyelitis, unspecified: Secondary | ICD-10-CM

## 2015-07-13 DIAGNOSIS — A499 Bacterial infection, unspecified: Secondary | ICD-10-CM | POA: Diagnosis not present

## 2015-07-13 DIAGNOSIS — E119 Type 2 diabetes mellitus without complications: Secondary | ICD-10-CM | POA: Diagnosis not present

## 2015-07-13 DIAGNOSIS — D649 Anemia, unspecified: Secondary | ICD-10-CM | POA: Diagnosis not present

## 2015-07-13 DIAGNOSIS — D464 Refractory anemia, unspecified: Secondary | ICD-10-CM | POA: Diagnosis not present

## 2015-07-13 NOTE — Progress Notes (Signed)
RFV: follow up on HW infection to left leg Subjective:    Patient ID: Jorge [name], male    DOB: [birth year], 50 y.o.   MRN: [id number]  HPI 50yo M with HTN, COPD on sup O2, sleep apnea, CKD, who has had HW placement to left lower leg, complicated by osteomyelitis. We had seen him in [facility] 2016 where he was given 2 months of doxycycline for chronic suppression. His labs did show improvement in inflammatory markers. In the meantime, he was found to have osteonecrosis of right knee for which he needs knee replacement. He is currently wearing a brace wihtout much improvement  Allergies  Allergen Reactions  . Penicillins Anaphylaxis    Patient states he is not allergic to anything;  Pt says it's his twin brother that has this allergy.   Current Outpatient Prescriptions on File Prior to Visit  Medication Sig Dispense Refill  . ARIPiprazole (ABILIFY) 30 MG tablet Take 20 mg by mouth daily.     Marland Kitchen atenolol (TENORMIN) 25 MG tablet Take by mouth daily.    . calcium citrate (CALCITRATE - DOSED IN MG ELEMENTAL CALCIUM) 950 MG tablet Take 1 tablet by mouth daily.    . cholecalciferol (VITAMIN D) 1000 UNITS tablet Take 1,000 Units by mouth daily. Reported on 05/03/2015    . citalopram (CELEXA) 40 MG tablet Take 40 mg by mouth at bedtime.     Marland Kitchen Fe Bisgly-Succ-C-Thre-B12-FA (IRON-150 PO) Take 1 tablet by mouth daily.    Marland Kitchen gabapentin (NEURONTIN) 300 MG capsule Take 300 mg by mouth 3 (three) times daily.    Marland Kitchen ipratropium-albuterol (DUONEB) 0.5-2.5 (3) MG/3ML SOLN Take 3 mLs by nebulization 3 (three) times daily. 360 mL 3  . lactulose, encephalopathy, (CHRONULAC) 10 GM/15ML SOLN Take 20 g by mouth 3 (three) times daily.    . OXYGEN Inhale 3 L into the lungs daily.    Marland Kitchen PROAIR HFA 108 (90 BASE) MCG/ACT inhaler Inhale 2 puffs into the lungs every 6 (six) hours as needed for wheezing or shortness of breath. Shortness of breath/wheeze    . rifaximin (XIFAXAN) 200 MG tablet Take 200 mg by mouth daily.    Marland Kitchen  torsemide (DEMADEX) 20 MG tablet Take 40 mg by mouth daily.    . traZODone (DESYREL) 50 MG tablet Take 150 mg by mouth at bedtime.    Marland Kitchen doxycycline (VIBRA-TABS) 100 MG tablet Take 1 tablet (100 mg total) by mouth 2 (two) times daily. (Patient not taking: Reported on 07/13/2015) 60 tablet 2  . enoxaparin (LOVENOX) 80 MG/0.8ML injection Inject 0.8 mLs (80 mg total) into the skin daily. (Patient not taking: Reported on 05/03/2015) 0 Syringe   . HYDROmorphone (DILAUDID) 2 MG tablet Take 2 mg by mouth every 4 (four) hours as needed for moderate pain or severe pain. Reported on 07/13/2015    . mupirocin ointment (BACTROBAN) 2 % Place 1 application into the nose 2 (two) times daily. (Patient not taking: Reported on 07/13/2015) 22 g 0  . nadolol (CORGARD) 20 MG tablet Take 20 mg by mouth daily. Reported on 07/13/2015    . traMADol (ULTRAM) 50 MG tablet Take 50 mg by mouth 2 (two) times daily. Reported on 07/13/2015     No current facility-administered medications on file prior to visit.   Active Ambulatory Problems    Diagnosis Date Noted  . COPD exacerbation ([place]) 08/29/2014  . Chest pain 08/29/2014  . Cirrhosis ([place])   . Hypertension   . CKD (chronic kidney disease) 08/29/2014  .  Acute respiratory failure with hypoxia ([place]) 08/29/2014  . Pain in the chest   . Essential hypertension   . Insomnia 10/03/2014  . Chronic pain 10/03/2014  . GERD without esophagitis 10/03/2014  . Chronic obstructive pulmonary disease with acute exacerbation ([place])   . Renal insufficiency   . Sleep apnea   . Tobacco abuse   . Thrombocytopenia ([place]) 10/27/2014  . Hyperglycemia 10/27/2014  . COPD with exacerbation ([place]) 10/27/2014  . Acute respiratory failure ([place]) 10/27/2014  . Anemia 12/21/2014  . Pressure ulcer 12/23/2014  . Syncope 12/23/2014   Resolved Ambulatory Problems    Diagnosis Date Noted  . No Resolved Ambulatory Problems   Past Medical History  Diagnosis Date  . COPD (chronic obstructive pulmonary  disease) ([place])   . Renal disorder   . Suicide attempt ([place])   . Diabetes mellitus without complication [facility])    Social History  Substance Use Topics  . Smoking status: Current Every Day Smoker -- 0.50 packs/day    Types: Cigarettes  . Smokeless tobacco: Not on file  . Alcohol Use: No   family history includes Cancer in his mother; Heart disease in his father.  Review of Systems Review of Systems  Constitutional: Negative for fever, chills, diaphoresis, activity change, appetite change, fatigue and unexpected weight change.  HENT: Negative for congestion, sore throat, rhinorrhea, sneezing, trouble swallowing and sinus pressure.  Eyes: Negative for photophobia and visual disturbance.  Respiratory: Negative for cough,but has shortness of breath at rest with little acitivity since he is weaning him selft off of sup oxygen. Cardiovascular: Negative for chest pain, palpitations and leg swelling.  Gastrointestinal: Negative for nausea, vomiting, abdominal pain, diarrhea, constipation, blood in stool, abdominal distention and anal bleeding.  Genitourinary: Negative for dysuria, hematuria, flank pain and difficulty urinating.  Musculoskeletal: + right knee pain  Skin: Negative for color change, pallor, rash and wound.  Neurological: Negative for dizziness, tremors, weakness and light-headedness.  Hematological: Negative for adenopathy. Does not bruise/bleed easily.  Psychiatric/Behavioral: Negative for behavioral problems, confusion, sleep disturbance, dysphoric mood, decreased concentration and agitation.       Objective:   Physical Exam BP 109/70 mmHg  Pulse 62  Temp(Src) 98.3 F (36.8 C) (Oral)  Ht 6\' 3"  (1.905 m)  Wt 359 lb 12.8 oz (163.204 kg)  BMI 44.97 kg/m2 Physical Exam  Constitutional: He is oriented to person, place, and time. He appears well-developed and well-nourished. No distress.  HENT:  Mouth/Throat: Oropharynx is clear and moist. No oropharyngeal exudate.    Cardiovascular: Normal rate, regular rhythm and normal heart sounds. Exam reveals no gallop and no friction rub.  No murmur heard.  Pulmonary/Chest: Effort normal and breath sounds normal. No respiratory distress. He has no wheezes.  Abdominal: Soft. Bowel sounds are normal. He exhibits no distension. There is no tenderness.  Lymphadenopathy:  He has no cervical adenopathy.  Neurological: He is alert and oriented to person, place, and time.  Skin: Skin to left leg shows defect to left calf from prior surgery. Ruddy possibly due to venous stasis changes. Pitting edema +1. No erythema. No drainage. No excessive wormth Psychiatric: He has a normal mood and affect. His behavior is normal.   Lab Results  Component Value Date   ESRSEDRATE 35* 05/03/2015   i have reviewed notes and labs with sed rate down to 48 at end of January         Assessment & Plan:  Left leg chronic osteomyelitis = since sed rate is still >  30, would recommend to continue his current abtx for the next 4-6wk  New knee arthroplasty = recommend to have patient be seen by orthopedics, and may benefit from presurgical evaulation  Copd = will need supplemental o2 if he has hypoxia

## 2015-07-14 DIAGNOSIS — D649 Anemia, unspecified: Secondary | ICD-10-CM | POA: Diagnosis not present

## 2015-07-14 DIAGNOSIS — M6281 Muscle weakness (generalized): Secondary | ICD-10-CM | POA: Diagnosis not present

## 2015-07-16 MED ORDER — DOXYCYCLINE HYCLATE 100 MG PO TABS: 100.0000 mg | ORAL_TABLET | Freq: Two times a day (BID) | ORAL | Status: DC

## 2015-07-19 DIAGNOSIS — R600 Localized edema: Secondary | ICD-10-CM | POA: Diagnosis not present

## 2015-07-19 DIAGNOSIS — M79669 Pain in unspecified lower leg: Secondary | ICD-10-CM | POA: Diagnosis not present

## 2015-07-20 DIAGNOSIS — G4730 Sleep apnea, unspecified: Secondary | ICD-10-CM | POA: Diagnosis not present

## 2015-07-20 DIAGNOSIS — I10 Essential (primary) hypertension: Secondary | ICD-10-CM | POA: Diagnosis not present

## 2015-07-20 DIAGNOSIS — M1711 Unilateral primary osteoarthritis, right knee: Secondary | ICD-10-CM | POA: Diagnosis not present

## 2015-07-20 DIAGNOSIS — J449 Chronic obstructive pulmonary disease, unspecified: Secondary | ICD-10-CM | POA: Diagnosis not present

## 2015-07-20 DIAGNOSIS — M7989 Other specified soft tissue disorders: Secondary | ICD-10-CM | POA: Diagnosis not present

## 2015-07-22 ENCOUNTER — Other Ambulatory Visit (HOSPITAL_COMMUNITY): Payer: Self-pay | Admitting: Respiratory Therapy

## 2015-07-23 ENCOUNTER — Other Ambulatory Visit (HOSPITAL_COMMUNITY): Payer: Self-pay | Admitting: Respiratory Therapy

## 2015-07-23 DIAGNOSIS — M869 Osteomyelitis, unspecified: Principal | ICD-10-CM

## 2015-07-23 DIAGNOSIS — E1169 Type 2 diabetes mellitus with other specified complication: Secondary | ICD-10-CM

## 2015-07-24 DIAGNOSIS — D649 Anemia, unspecified: Secondary | ICD-10-CM | POA: Diagnosis not present

## 2015-07-24 DIAGNOSIS — R4182 Altered mental status, unspecified: Secondary | ICD-10-CM | POA: Diagnosis not present

## 2015-07-24 DIAGNOSIS — I10 Essential (primary) hypertension: Secondary | ICD-10-CM | POA: Diagnosis not present

## 2015-07-24 DIAGNOSIS — N390 Urinary tract infection, site not specified: Secondary | ICD-10-CM | POA: Diagnosis not present

## 2015-07-28 DIAGNOSIS — N183 Chronic kidney disease, stage 3 (moderate): Secondary | ICD-10-CM | POA: Diagnosis not present

## 2015-07-28 DIAGNOSIS — J449 Chronic obstructive pulmonary disease, unspecified: Secondary | ICD-10-CM | POA: Diagnosis not present

## 2015-07-28 DIAGNOSIS — I129 Hypertensive chronic kidney disease with stage 1 through stage 4 chronic kidney disease, or unspecified chronic kidney disease: Secondary | ICD-10-CM | POA: Diagnosis not present

## 2015-07-28 DIAGNOSIS — E1122 Type 2 diabetes mellitus with diabetic chronic kidney disease: Secondary | ICD-10-CM | POA: Diagnosis not present

## 2015-07-28 DIAGNOSIS — Z880 Allergy status to penicillin: Secondary | ICD-10-CM | POA: Diagnosis not present

## 2015-07-28 DIAGNOSIS — S82832D Other fracture of upper and lower end of left fibula, subsequent encounter for closed fracture with routine healing: Secondary | ICD-10-CM | POA: Diagnosis not present

## 2015-07-28 DIAGNOSIS — Z7951 Long term (current) use of inhaled steroids: Secondary | ICD-10-CM | POA: Diagnosis not present

## 2015-07-28 DIAGNOSIS — Z87891 Personal history of nicotine dependence: Secondary | ICD-10-CM | POA: Diagnosis not present

## 2015-07-28 DIAGNOSIS — Z4789 Encounter for other orthopedic aftercare: Secondary | ICD-10-CM | POA: Diagnosis not present

## 2015-07-28 DIAGNOSIS — M25462 Effusion, left knee: Secondary | ICD-10-CM | POA: Diagnosis not present

## 2015-07-28 DIAGNOSIS — S82142D Displaced bicondylar fracture of left tibia, subsequent encounter for closed fracture with routine healing: Secondary | ICD-10-CM | POA: Diagnosis not present

## 2015-07-30 ENCOUNTER — Ambulatory Visit (HOSPITAL_COMMUNITY)
Admission: RE | Admit: 2015-07-30 | Discharge: 2015-07-30 | Disposition: A | Discharge status: Home / Self Care | Payer: Medicare Other | Source: Ambulatory Visit | Attending: Internal Medicine | Admitting: Internal Medicine

## 2015-07-30 DIAGNOSIS — M869 Osteomyelitis, unspecified: Secondary | ICD-10-CM | POA: Diagnosis not present

## 2015-07-30 DIAGNOSIS — Z01818 Encounter for other preprocedural examination: Secondary | ICD-10-CM | POA: Insufficient documentation

## 2015-07-30 MED ORDER — ALBUTEROL SULFATE (2.5 MG/3ML) 0.083% IN NEBU
2.5000 mg | INHALATION_SOLUTION | Freq: Once | RESPIRATORY_TRACT | Status: AC
  Administered 2015-07-30: 2.5 mg via RESPIRATORY_TRACT

## 2015-08-01 LAB — PULMONARY FUNCTION TEST
DL/VA % pred: 93 %
DL/VA: 4.58 ml/min/mmHg/L
DLCO unc % pred: 48 %
DLCO unc: 18.97 ml/min/mmHg
FEF 25-75 Post: 1 L/sec
FEF 25-75 Pre: 0.91 L/sec
FEF2575-%CHANGE-POST: 10 %
FEF2575-%Pred-Post: 25 %
FEF2575-%Pred-Pre: 23 %
FEV1-%Change-Post: 3 %
FEV1-%PRED-POST: 35 %
FEV1-%PRED-PRE: 34 %
FEV1-POST: 1.63 L
FEV1-PRE: 1.57 L
FEV1FVC-%CHANGE-POST: 1 %
FEV1FVC-%Pred-Pre: 85 %
FEV6-%Change-Post: 3 %
FEV6-%PRED-PRE: 41 %
FEV6-%Pred-Post: 43 %
FEV6-POST: 2.45 L
FEV6-PRE: 2.37 L
FEV6FVC-%Change-Post: 0 %
FEV6FVC-%PRED-POST: 102 %
FEV6FVC-%PRED-PRE: 102 %
FVC-%CHANGE-POST: 2 %
FVC-%PRED-PRE: 40 %
FVC-%Pred-Post: 41 %
FVC-POST: 2.45 L
FVC-PRE: 2.4 L
POST FEV6/FVC RATIO: 100 %
Post FEV1/FVC ratio: 67 %
Pre FEV1/FVC ratio: 66 %
Pre FEV6/FVC Ratio: 99 %
RV % PRED: 155 %
RV: 3.64 L
TLC % pred: 70 %
TLC: 5.6 L

## 2015-08-12 DIAGNOSIS — J449 Chronic obstructive pulmonary disease, unspecified: Secondary | ICD-10-CM | POA: Diagnosis not present

## 2015-08-12 DIAGNOSIS — M2550 Pain in unspecified joint: Secondary | ICD-10-CM | POA: Diagnosis not present

## 2015-08-12 DIAGNOSIS — R4182 Altered mental status, unspecified: Secondary | ICD-10-CM | POA: Diagnosis not present

## 2015-08-14 DIAGNOSIS — D649 Anemia, unspecified: Secondary | ICD-10-CM | POA: Diagnosis not present

## 2015-08-14 DIAGNOSIS — S82142D Displaced bicondylar fracture of left tibia, subsequent encounter for closed fracture with routine healing: Secondary | ICD-10-CM | POA: Diagnosis not present

## 2015-08-14 DIAGNOSIS — M6281 Muscle weakness (generalized): Secondary | ICD-10-CM | POA: Diagnosis not present

## 2015-08-16 DIAGNOSIS — D649 Anemia, unspecified: Secondary | ICD-10-CM | POA: Diagnosis not present

## 2015-08-16 DIAGNOSIS — S82142D Displaced bicondylar fracture of left tibia, subsequent encounter for closed fracture with routine healing: Secondary | ICD-10-CM | POA: Diagnosis not present

## 2015-08-16 DIAGNOSIS — M6281 Muscle weakness (generalized): Secondary | ICD-10-CM | POA: Diagnosis not present

## 2015-08-17 DIAGNOSIS — S82142D Displaced bicondylar fracture of left tibia, subsequent encounter for closed fracture with routine healing: Secondary | ICD-10-CM | POA: Diagnosis not present

## 2015-08-17 DIAGNOSIS — M6281 Muscle weakness (generalized): Secondary | ICD-10-CM | POA: Diagnosis not present

## 2015-08-17 DIAGNOSIS — D649 Anemia, unspecified: Secondary | ICD-10-CM | POA: Diagnosis not present

## 2015-08-18 DIAGNOSIS — M6281 Muscle weakness (generalized): Secondary | ICD-10-CM | POA: Diagnosis not present

## 2015-08-18 DIAGNOSIS — D649 Anemia, unspecified: Secondary | ICD-10-CM | POA: Diagnosis not present

## 2015-08-18 DIAGNOSIS — S82142D Displaced bicondylar fracture of left tibia, subsequent encounter for closed fracture with routine healing: Secondary | ICD-10-CM | POA: Diagnosis not present

## 2015-08-19 DIAGNOSIS — D649 Anemia, unspecified: Secondary | ICD-10-CM | POA: Diagnosis not present

## 2015-08-19 DIAGNOSIS — S82142D Displaced bicondylar fracture of left tibia, subsequent encounter for closed fracture with routine healing: Secondary | ICD-10-CM | POA: Diagnosis not present

## 2015-08-19 DIAGNOSIS — M6281 Muscle weakness (generalized): Secondary | ICD-10-CM | POA: Diagnosis not present

## 2015-08-19 DIAGNOSIS — I509 Heart failure, unspecified: Secondary | ICD-10-CM | POA: Diagnosis not present

## 2015-08-20 DIAGNOSIS — D649 Anemia, unspecified: Secondary | ICD-10-CM | POA: Diagnosis not present

## 2015-08-20 DIAGNOSIS — M6281 Muscle weakness (generalized): Secondary | ICD-10-CM | POA: Diagnosis not present

## 2015-08-20 DIAGNOSIS — S82142D Displaced bicondylar fracture of left tibia, subsequent encounter for closed fracture with routine healing: Secondary | ICD-10-CM | POA: Diagnosis not present

## 2015-08-23 DIAGNOSIS — M6281 Muscle weakness (generalized): Secondary | ICD-10-CM | POA: Diagnosis not present

## 2015-08-23 DIAGNOSIS — R4182 Altered mental status, unspecified: Secondary | ICD-10-CM | POA: Diagnosis not present

## 2015-08-23 DIAGNOSIS — D649 Anemia, unspecified: Secondary | ICD-10-CM | POA: Diagnosis not present

## 2015-08-23 DIAGNOSIS — R0602 Shortness of breath: Secondary | ICD-10-CM | POA: Diagnosis not present

## 2015-08-23 DIAGNOSIS — J449 Chronic obstructive pulmonary disease, unspecified: Secondary | ICD-10-CM | POA: Diagnosis not present

## 2015-08-23 DIAGNOSIS — S82142D Displaced bicondylar fracture of left tibia, subsequent encounter for closed fracture with routine healing: Secondary | ICD-10-CM | POA: Diagnosis not present

## 2015-08-24 DIAGNOSIS — M6281 Muscle weakness (generalized): Secondary | ICD-10-CM | POA: Diagnosis not present

## 2015-08-24 DIAGNOSIS — S82142D Displaced bicondylar fracture of left tibia, subsequent encounter for closed fracture with routine healing: Secondary | ICD-10-CM | POA: Diagnosis not present

## 2015-08-24 DIAGNOSIS — D649 Anemia, unspecified: Secondary | ICD-10-CM | POA: Diagnosis not present

## 2015-08-25 DIAGNOSIS — S82142D Displaced bicondylar fracture of left tibia, subsequent encounter for closed fracture with routine healing: Secondary | ICD-10-CM | POA: Diagnosis not present

## 2015-08-25 DIAGNOSIS — D649 Anemia, unspecified: Secondary | ICD-10-CM | POA: Diagnosis not present

## 2015-08-25 DIAGNOSIS — M6281 Muscle weakness (generalized): Secondary | ICD-10-CM | POA: Diagnosis not present

## 2015-08-26 DIAGNOSIS — D649 Anemia, unspecified: Secondary | ICD-10-CM | POA: Diagnosis not present

## 2015-08-26 DIAGNOSIS — M6281 Muscle weakness (generalized): Secondary | ICD-10-CM | POA: Diagnosis not present

## 2015-08-26 DIAGNOSIS — S82142D Displaced bicondylar fracture of left tibia, subsequent encounter for closed fracture with routine healing: Secondary | ICD-10-CM | POA: Diagnosis not present

## 2015-08-27 DIAGNOSIS — M6281 Muscle weakness (generalized): Secondary | ICD-10-CM | POA: Diagnosis not present

## 2015-08-27 DIAGNOSIS — D649 Anemia, unspecified: Secondary | ICD-10-CM | POA: Diagnosis not present

## 2015-08-27 DIAGNOSIS — S82142D Displaced bicondylar fracture of left tibia, subsequent encounter for closed fracture with routine healing: Secondary | ICD-10-CM | POA: Diagnosis not present

## 2015-08-30 DIAGNOSIS — M6281 Muscle weakness (generalized): Secondary | ICD-10-CM | POA: Diagnosis not present

## 2015-08-30 DIAGNOSIS — D649 Anemia, unspecified: Secondary | ICD-10-CM | POA: Diagnosis not present

## 2015-08-30 DIAGNOSIS — S82142D Displaced bicondylar fracture of left tibia, subsequent encounter for closed fracture with routine healing: Secondary | ICD-10-CM | POA: Diagnosis not present

## 2015-08-31 DIAGNOSIS — M6281 Muscle weakness (generalized): Secondary | ICD-10-CM | POA: Diagnosis not present

## 2015-08-31 DIAGNOSIS — D649 Anemia, unspecified: Secondary | ICD-10-CM | POA: Diagnosis not present

## 2015-08-31 DIAGNOSIS — S82142D Displaced bicondylar fracture of left tibia, subsequent encounter for closed fracture with routine healing: Secondary | ICD-10-CM | POA: Diagnosis not present

## 2015-09-01 DIAGNOSIS — M1711 Unilateral primary osteoarthritis, right knee: Secondary | ICD-10-CM | POA: Diagnosis not present

## 2015-09-01 DIAGNOSIS — I10 Essential (primary) hypertension: Secondary | ICD-10-CM | POA: Diagnosis not present

## 2015-09-01 DIAGNOSIS — J449 Chronic obstructive pulmonary disease, unspecified: Secondary | ICD-10-CM | POA: Diagnosis not present

## 2015-09-01 DIAGNOSIS — D649 Anemia, unspecified: Secondary | ICD-10-CM | POA: Diagnosis not present

## 2015-09-01 DIAGNOSIS — G4733 Obstructive sleep apnea (adult) (pediatric): Secondary | ICD-10-CM | POA: Diagnosis not present

## 2015-09-01 DIAGNOSIS — S82142D Displaced bicondylar fracture of left tibia, subsequent encounter for closed fracture with routine healing: Secondary | ICD-10-CM | POA: Diagnosis not present

## 2015-09-01 DIAGNOSIS — M6281 Muscle weakness (generalized): Secondary | ICD-10-CM | POA: Diagnosis not present

## 2015-09-02 DIAGNOSIS — S82142D Displaced bicondylar fracture of left tibia, subsequent encounter for closed fracture with routine healing: Secondary | ICD-10-CM | POA: Diagnosis not present

## 2015-09-02 DIAGNOSIS — D649 Anemia, unspecified: Secondary | ICD-10-CM | POA: Diagnosis not present

## 2015-09-02 DIAGNOSIS — M6281 Muscle weakness (generalized): Secondary | ICD-10-CM | POA: Diagnosis not present

## 2015-09-03 DIAGNOSIS — S82142D Displaced bicondylar fracture of left tibia, subsequent encounter for closed fracture with routine healing: Secondary | ICD-10-CM | POA: Diagnosis not present

## 2015-09-03 DIAGNOSIS — D649 Anemia, unspecified: Secondary | ICD-10-CM | POA: Diagnosis not present

## 2015-09-03 DIAGNOSIS — M6281 Muscle weakness (generalized): Secondary | ICD-10-CM | POA: Diagnosis not present

## 2015-09-06 DIAGNOSIS — D649 Anemia, unspecified: Secondary | ICD-10-CM | POA: Diagnosis not present

## 2015-09-06 DIAGNOSIS — S82142D Displaced bicondylar fracture of left tibia, subsequent encounter for closed fracture with routine healing: Secondary | ICD-10-CM | POA: Diagnosis not present

## 2015-09-06 DIAGNOSIS — M6281 Muscle weakness (generalized): Secondary | ICD-10-CM | POA: Diagnosis not present

## 2015-09-07 ENCOUNTER — Ambulatory Visit (INDEPENDENT_AMBULATORY_CARE_PROVIDER_SITE_OTHER): Payer: Medicare Other | Admitting: Internal Medicine

## 2015-09-07 ENCOUNTER — Encounter: Payer: Self-pay | Admitting: Internal Medicine

## 2015-09-07 VITALS — BP 125/74 | HR 64 | Temp 97.8°F

## 2015-09-07 DIAGNOSIS — M869 Osteomyelitis, unspecified: Secondary | ICD-10-CM | POA: Diagnosis not present

## 2015-09-07 NOTE — Progress Notes (Signed)
RFV: follow up visit for left leg osteomyelitis s/p HW removal Subjective:    Patient ID: Jorge [name], male    DOB: 09-Aug-1965, 50 y.o.   MRN: [id number]  HPI 50yo M with hx of diabetes, HTN, obesity, COPD, OSA, being followed for left leg osteomyelitis following tibia fracture requiring HW placement, debridement. He was initially treated with IV vanco x 8 wks followed by doxycycline for chronic suppression. At last visit, sed rate at 48, where we extended his oral doxy for addn 6 wk, for which he is here for follow up. His left leg is completely healed. [facility] for rehab, and optimization of fluid status, and COPD. He follows with pulmonologist in [place]. He wears his cpap in evening for roughly 6 hr but he reports still feels fatigued in the am, and feel that he needs additional oxygen.he denies fever, chills, nightsweats. Does have right knee pain which is to be evaluated for TKA. He does have lower extremity swelling for which he is on diuretics.  Allergies  Allergen Reactions  . Penicillins Anaphylaxis    Patient states he is not allergic to anything;  Pt says it's his twin brother that has this allergy.   Current Outpatient Prescriptions on File Prior to Visit  Medication Sig Dispense Refill  . ARIPiprazole (ABILIFY) 30 MG tablet Take 20 mg by mouth daily.     Marland Kitchen aspirin 81 MG tablet Take 81 mg by mouth daily.    Marland Kitchen atenolol (TENORMIN) 25 MG tablet Take by mouth daily.    . calcium citrate (CALCITRATE - DOSED IN MG ELEMENTAL CALCIUM) 950 MG tablet Take 1 tablet by mouth daily.    . cholecalciferol (VITAMIN D) 1000 UNITS tablet Take 1,000 Units by mouth daily. Reported on 05/03/2015    . citalopram (CELEXA) 40 MG tablet Take 40 mg by mouth at bedtime.     Marland Kitchen doxycycline (VIBRA-TABS) 100 MG tablet Take 1 tablet (100 mg total) by mouth 2 (two) times daily. 60 tablet 1  . enoxaparin (LOVENOX) 80 MG/0.8ML injection Inject 0.8 mLs (80 mg total) into the  skin daily. (Patient not taking: Reported on 05/03/2015) 0 Syringe   . Fe Bisgly-Succ-C-Thre-B12-FA (IRON-150 PO) Take 1 tablet by mouth daily.    Marland Kitchen gabapentin (NEURONTIN) 300 MG capsule Take 300 mg by mouth 3 (three) times daily.    Marland Kitchen guaiFENesin (MUCINEX) 600 MG 12 hr tablet Take 600 mg by mouth 2 (two) times daily. PRN cough and congestion    . HYDROcodone-acetaminophen (NORCO/VICODIN) 5-325 MG tablet Take 1 tablet by mouth every 6 (six) hours as needed for moderate pain.    Marland Kitchen HYDROmorphone (DILAUDID) 2 MG tablet Take 2 mg by mouth every 4 (four) hours as needed for moderate pain or severe pain. Reported on 07/13/2015    . ipratropium-albuterol (DUONEB) 0.5-2.5 (3) MG/3ML SOLN Take 3 mLs by nebulization 3 (three) times daily. 360 mL 3  . lactulose, encephalopathy, (CHRONULAC) 10 GM/15ML SOLN Take 20 g by mouth 3 (three) times daily.    . mupirocin ointment (BACTROBAN) 2 % Place 1 application into the nose 2 (two) times daily. (Patient not taking: Reported on 07/13/2015) 22 g 0  . nadolol (CORGARD) 20 MG tablet Take 20 mg by mouth daily. Reported on 07/13/2015    . OXYGEN Inhale 3 L into the lungs daily.    . polyethylene glycol (MIRALAX / GLYCOLAX) packet Take 17 g by mouth daily. PRN constipation    .  PROAIR HFA 108 (90 BASE) MCG/ACT inhaler Inhale 2 puffs into the lungs every 6 (six) hours as needed for wheezing or shortness of breath. Shortness of breath/wheeze    . rifaximin (XIFAXAN) 200 MG tablet Take 200 mg by mouth daily.    . sennosides-docusate sodium (SENOKOT-S) 8.6-50 MG tablet Take 1 tablet by mouth daily. PRN constipation    . torsemide (DEMADEX) 20 MG tablet Take 40 mg by mouth daily.    . traMADol (ULTRAM) 50 MG tablet Take 50 mg by mouth 2 (two) times daily. Reported on 07/13/2015    . traZODone (DESYREL) 50 MG tablet Take 150 mg by mouth at bedtime.     No current facility-administered medications on file prior to visit.   Active Ambulatory Problems    Diagnosis Date Noted  .  COPD exacerbation ([place]) 08/29/2014  . Chest pain 08/29/2014  . Cirrhosis ([place])   . Hypertension   . CKD (chronic kidney disease) 08/29/2014  . Acute respiratory failure with hypoxia ([place]) 08/29/2014  . Pain in the chest   . Essential hypertension   . Insomnia 10/03/2014  . Chronic pain 10/03/2014  . GERD without esophagitis 10/03/2014  . Chronic obstructive pulmonary disease with acute exacerbation ([place])   . Renal insufficiency   . Sleep apnea   . Tobacco abuse   . Thrombocytopenia ([place]) 10/27/2014  . Hyperglycemia 10/27/2014  . COPD with exacerbation ([place]) 10/27/2014  . Acute respiratory failure ([place]) 10/27/2014  . Anemia 12/21/2014  . Pressure ulcer 12/23/2014  . Syncope 12/23/2014   Resolved Ambulatory Problems    Diagnosis Date Noted  . No Resolved Ambulatory Problems   Past Medical History  Diagnosis Date  . COPD (chronic obstructive pulmonary disease) ([place])   . Renal disorder   . Suicide attempt ([place])   . Diabetes mellitus without complication ([place])       Review of Systems +fatigue despite wearing cpap, swelling of lower extremities, otherwise 10 point ros is negative    Objective:   Physical Exam BP 125/74 mmHg  Pulse 64  Temp(Src) 97.8 F (36.6 C) (Oral)  Wt  Left lower leg well healed, skin graft. No warmth. Has hyperpigmentation for venous stasis. Dry skin. No open lesions Right lower leg +2 pitting edema       Assessment & Plan:  Osteomyelitis of left lower leg = will check sed rate. If normalized can stop taking doxycycline. For now will keep doxy for 4 wk. Will call snf at [phone] for further instructions  Copd/osa = recommend to move up appt with pulmonology to see if further optimization is needed for upcoming knee replacement (right TKA)

## 2015-09-08 LAB — SEDIMENTATION RATE: Sed Rate: 36 mm/hr — ABNORMAL HIGH (ref 0–20)

## 2015-09-10 ENCOUNTER — Encounter (HOSPITAL_COMMUNITY): Payer: Self-pay | Admitting: Emergency Medicine

## 2015-09-10 ENCOUNTER — Emergency Department (HOSPITAL_COMMUNITY)
Admission: EM | Admit: 2015-09-10 | Discharge: 2015-09-11 | Disposition: A | Discharge status: Home / Self Care | Payer: Medicare Other | Attending: Emergency Medicine | Admitting: Emergency Medicine

## 2015-09-10 DIAGNOSIS — Z7984 Long term (current) use of oral hypoglycemic drugs: Secondary | ICD-10-CM | POA: Diagnosis not present

## 2015-09-10 DIAGNOSIS — R739 Hyperglycemia, unspecified: Secondary | ICD-10-CM | POA: Diagnosis not present

## 2015-09-10 DIAGNOSIS — D649 Anemia, unspecified: Secondary | ICD-10-CM | POA: Diagnosis not present

## 2015-09-10 DIAGNOSIS — Z79891 Long term (current) use of opiate analgesic: Secondary | ICD-10-CM | POA: Insufficient documentation

## 2015-09-10 DIAGNOSIS — E860 Dehydration: Secondary | ICD-10-CM | POA: Diagnosis not present

## 2015-09-10 DIAGNOSIS — E1165 Type 2 diabetes mellitus with hyperglycemia: Secondary | ICD-10-CM | POA: Insufficient documentation

## 2015-09-10 DIAGNOSIS — F17210 Nicotine dependence, cigarettes, uncomplicated: Secondary | ICD-10-CM | POA: Diagnosis not present

## 2015-09-10 DIAGNOSIS — I10 Essential (primary) hypertension: Secondary | ICD-10-CM | POA: Diagnosis not present

## 2015-09-10 DIAGNOSIS — Z7982 Long term (current) use of aspirin: Secondary | ICD-10-CM | POA: Diagnosis not present

## 2015-09-10 DIAGNOSIS — J449 Chronic obstructive pulmonary disease, unspecified: Secondary | ICD-10-CM | POA: Insufficient documentation

## 2015-09-10 DIAGNOSIS — R7309 Other abnormal glucose: Secondary | ICD-10-CM | POA: Diagnosis not present

## 2015-09-10 DIAGNOSIS — Z79899 Other long term (current) drug therapy: Secondary | ICD-10-CM | POA: Diagnosis not present

## 2015-09-10 DIAGNOSIS — N183 Chronic kidney disease, stage 3 (moderate): Secondary | ICD-10-CM | POA: Diagnosis not present

## 2015-09-10 LAB — URINALYSIS, ROUTINE W REFLEX MICROSCOPIC
Bilirubin Urine: NEGATIVE
Glucose, UA: 500 mg/dL — AB
Hgb urine dipstick: NEGATIVE
Ketones, ur: NEGATIVE mg/dL
LEUKOCYTES UA: NEGATIVE
Nitrite: NEGATIVE
PROTEIN: NEGATIVE mg/dL
SPECIFIC GRAVITY, URINE: 1.01 (ref 1.005–1.030)
pH: 6 (ref 5.0–8.0)

## 2015-09-10 LAB — CBC
HEMATOCRIT: 41.2 % (ref 39.0–52.0)
HEMOGLOBIN: 13.3 g/dL (ref 13.0–17.0)
MCH: 33.1 pg (ref 26.0–34.0)
MCHC: 32.3 g/dL (ref 30.0–36.0)
MCV: 102.5 fL — AB (ref 78.0–100.0)
Platelets: 116 10*3/uL — ABNORMAL LOW (ref 150–400)
RBC: 4.02 MIL/uL — ABNORMAL LOW (ref 4.22–5.81)
RDW: 14.7 % (ref 11.5–15.5)
WBC: 5.9 10*3/uL (ref 4.0–10.5)

## 2015-09-10 LAB — BASIC METABOLIC PANEL
ANION GAP: 10 (ref 5–15)
BUN: 24 mg/dL — AB (ref 6–20)
CHLORIDE: 90 mmol/L — AB (ref 101–111)
CO2: 37 mmol/L — ABNORMAL HIGH (ref 22–32)
Calcium: 8.8 mg/dL — ABNORMAL LOW (ref 8.9–10.3)
Creatinine, Ser: 1.54 mg/dL — ABNORMAL HIGH (ref 0.61–1.24)
GFR calc Af Amer: 59 mL/min — ABNORMAL LOW (ref >60)
GFR calc non Af Amer: 51 mL/min — ABNORMAL LOW (ref >60)
GLUCOSE: 433 mg/dL — AB (ref 65–99)
POTASSIUM: 3.8 mmol/L (ref 3.5–5.1)
Sodium: 137 mmol/L (ref 135–145)

## 2015-09-10 LAB — CBG MONITORING, ED
GLUCOSE-CAPILLARY: 376 mg/dL — AB (ref 65–99)
GLUCOSE-CAPILLARY: 331 mg/dL — AB (ref 65–99)

## 2015-09-10 MED ORDER — SODIUM CHLORIDE 0.9 % IV BOLUS (SEPSIS)
2000.0000 mL | Freq: Once | INTRAVENOUS | Status: AC
  Administered 2015-09-10: 2000 mL via INTRAVENOUS

## 2015-09-10 MED ORDER — METFORMIN HCL 500 MG PO TABS: 500.0000 mg | ORAL_TABLET | Freq: Two times a day (BID) | ORAL | Status: DC

## 2015-09-10 MED ORDER — METFORMIN HCL 500 MG PO TABS
500.0000 mg | ORAL_TABLET | Freq: Once | ORAL | Status: AC
  Administered 2015-09-10: 500 mg via ORAL
  Filled 2015-09-10: qty 1

## 2015-09-10 NOTE — ED Provider Notes (Signed)
CSN: [id number]     Arrival date & time 09/10/15  2033 History  By signing my name below, I, Dora Sims, attest that this documentation has been prepared under the direction and in the presence of physician practitioner, Daleen Bo, MD. Electronically Signed: Dora Sims, Scribe. 09/10/2015. 9:09 PM.     Chief Complaint  Patient presents with  . Hyperglycemia    The history is provided by the patient. No language interpreter was used.     HPI Comments: Jorge [name] is a 50 y.o. male with h/o DM, cirrhosis, COPD, and HTN who presents to the Emergency Department complaining of severe hyperglycemia since earlier this morning. Pt lives at [facility] (since September 2016) and notes that his blood was drawn this morning; his blood sugar was measured at 465 and he was sent to the ER. He states that he has been drinking a lot of soda lately. He has not changed his medications recently. Pt is in a wheelchair because he broke his lower left leg in August 2016 and had surgery. He reports that he occasionally uses CPAP at night. Pt notes bilateral knee pain at baseline. He denies nausea, vomiting, and fever.   Past Medical History  Diagnosis Date  . COPD (chronic obstructive pulmonary disease) ([place])   . Cirrhosis ([place]) sleep apnea  . Hypertension   . Renal disorder     stage II  . Sleep apnea   . Tobacco abuse   . Syncope   . Suicide attempt ([place])   . Diabetes mellitus without complication ([place])   . Renal insufficiency    Past Surgical History  Procedure Laterality Date  . Cholecystectomy    . Leg surgery Left    Family History  Problem Relation Age of Onset  . Heart disease Father   . Cancer Mother     breast   Social History  Substance Use Topics  . Smoking status: Current Every Day Smoker -- 0.50 packs/day    Types: Cigarettes  . Smokeless tobacco: None     Comment: slowing down  . Alcohol Use: No    Review of Systems  All other  systems reviewed and are negative.  Allergies  Penicillins  Home Medications   Prior to Admission medications   Medication Sig Start Date End Date Taking? Authorizing Provider  ARIPiprazole (ABILIFY) 20 MG tablet Take 20 mg by mouth daily.   Yes Historical Provider, MD  aspirin 81 MG tablet Take 81 mg by mouth daily.   Yes Historical Provider, MD  atenolol (TENORMIN) 25 MG tablet Take 25 mg by mouth daily.    Yes Historical Provider, MD  calcium citrate (CALCITRATE - DOSED IN MG ELEMENTAL CALCIUM) 950 MG tablet Take 1 tablet by mouth daily.   Yes Historical Provider, MD  cholecalciferol (VITAMIN D) 1000 UNITS tablet Take 1,000 Units by mouth daily. Reported on 05/03/2015   Yes Historical Provider, MD  citalopram (CELEXA) 40 MG tablet Take 40 mg by mouth at bedtime.  12/03/13  Yes Historical Provider, MD  doxycycline (VIBRA-TABS) 100 MG tablet Take 1 tablet (100 mg total) by mouth 2 (two) times daily. 07/16/15  Yes Carlyle Basques, MD  Fe Bisgly-Succ-C-Thre-B12-FA (IRON-150 PO) Take 1 tablet by mouth daily.   Yes Historical Provider, MD  gabapentin (NEURONTIN) 300 MG capsule Take 300 mg by mouth 3 (three) times daily. 12/03/13  Yes Historical Provider, MD  guaiFENesin (MUCINEX) 600 MG 12 hr tablet Take 600 mg by mouth 2 (two)  times daily.    Yes Historical Provider, MD  HYDROcodone-acetaminophen (NORCO/VICODIN) 5-325 MG tablet Take 1 tablet by mouth every 6 (six) hours.    Yes Historical Provider, MD  ipratropium-albuterol (DUONEB) 0.5-2.5 (3) MG/3ML SOLN Take 3 mLs by nebulization 3 (three) times daily. 11/01/14  Yes Rosita Fire, MD  lactulose, encephalopathy, (CHRONULAC) 10 GM/15ML SOLN Take 14.5 g by mouth 3 (three) times daily. 16mls 3 times daily   Yes Historical Provider, MD  Melatonin 5 MG CAPS Take 1 capsule by mouth at bedtime.   Yes Historical Provider, MD  OXYGEN Inhale 3 L into the lungs daily as needed (for breathing).    Yes Historical Provider, MD  polyethylene glycol (MIRALAX /  GLYCOLAX) packet Take 17 g by mouth daily as needed for mild constipation or moderate constipation.    Yes Historical Provider, MD  PROAIR HFA 108 (90 BASE) MCG/ACT inhaler Inhale 2 puffs into the lungs every 6 (six) hours as needed for wheezing or shortness of breath. Shortness of breath/wheeze 12/05/13  Yes Historical Provider, MD  rifaximin (XIFAXAN) 200 MG tablet Take 200 mg by mouth daily.   Yes Historical Provider, MD  sennosides-docusate sodium (SENOKOT-S) 8.6-50 MG tablet Take 1 tablet by mouth daily as needed for constipation.    Yes Historical Provider, MD  torsemide (DEMADEX) 20 MG tablet Take 40 mg by mouth 2 (two) times daily.    Yes Historical Provider, MD  traZODone (DESYREL) 50 MG tablet Take 150 mg by mouth at bedtime.   Yes Historical Provider, MD  umeclidinium bromide (INCRUSE ELLIPTA) 62.5 MCG/INH AEPB Inhale 1 puff into the lungs daily.   Yes Historical Provider, MD  metFORMIN (GLUCOPHAGE) 500 MG tablet Take 1 tablet (500 mg total) by mouth 2 (two) times daily with a meal. 09/10/15   Daleen Bo, MD   BP 116/59 mmHg  Pulse 63  Temp(Src) 98.7 F (37.1 C) (Oral)  Resp 24  Ht 6\' 3"  (1.905 m)  Wt 380 lb (172.367 kg)  BMI 47.50 kg/m2  SpO2 93% Physical Exam  Constitutional: He is oriented to person, place, and time. He appears well-developed and well-nourished.  HENT:  Head: Normocephalic and atraumatic.  Right Ear: External ear normal.  Left Ear: External ear normal.  Eyes: Conjunctivae and EOM are normal. Pupils are equal, round, and reactive to light.  Neck: Normal range of motion and phonation normal. Neck supple.  Cardiovascular: Normal rate, regular rhythm and normal heart sounds.   Pulmonary/Chest: Effort normal. He has wheezes. He exhibits no bony tenderness.  Pulmonary: bilateral and expiatory wheezes.  Abdominal: Soft. There is tenderness in the epigastric area.  Mild mid-epigastric tenderness.  Musculoskeletal: Normal range of motion.  Neurological: He is  alert and oriented to person, place, and time. No cranial nerve deficit or sensory deficit. He exhibits normal muscle tone. Coordination normal.  Skin: Skin is warm, dry and intact.  Psychiatric: He has a normal mood and affect. His behavior is normal. Judgment and thought content normal.  Nursing note and vitals reviewed.   ED Course  Procedures (including critical care time)  DIAGNOSTIC STUDIES: Oxygen Saturation is 91% on [place], low by my interpretation.    COORDINATION OF CARE: 9:09 PM Discussed treatment plan with pt at bedside and pt agreed to plan.  Medications  sodium chloride 0.9 % bolus 2,000 mL (0 mLs Intravenous Stopped 09/10/15 2302)  metFORMIN (GLUCOPHAGE) tablet 500 mg (500 mg Oral Given 09/10/15 2323)    Patient Vitals for the past 24 hrs:  BP Temp Temp src Pulse Resp SpO2 Height Weight  09/10/15 2330 116/59 mmHg - - 63 24 93 % - -  09/10/15 2300 107/57 mmHg - - 74 24 (!) 89 % - -  09/10/15 2230 (!) 105/48 mmHg - - 70 - 92 % - -  09/10/15 2200 - - - - 22 - - -  09/10/15 2130 (!) 113/52 mmHg - - 70 22 91 % - -  09/10/15 2100 112/56 mmHg - - 71 - (!) 89 % - -  09/10/15 2043 (!) 118/54 mmHg 98.7 F (37.1 C) Oral 72 24 91 % 6\' 3"  (1.905 m) (!) 380 lb (172.367 kg)  09/10/15 2040 (!) 118/54 mmHg 98.7 F (37.1 C) Oral 77 18 91 % 6\' 3"  (1.905 m) (!) 380 lb (172.367 kg)    Labs Review Labs Reviewed  BASIC METABOLIC PANEL - Abnormal; Notable for the following:    Chloride 90 (*)    CO2 37 (*)    Glucose, Bld 433 (*)    BUN 24 (*)    Creatinine, Ser 1.54 (*)    Calcium 8.8 (*)    GFR calc non Af Amer 51 (*)    GFR calc Af Amer 59 (*)    All other components within normal limits  CBC - Abnormal; Notable for the following:    RBC 4.02 (*)    MCV 102.5 (*)    Platelets 116 (*)    All other components within normal limits  URINALYSIS, ROUTINE W REFLEX MICROSCOPIC (NOT AT [facility]) - Abnormal; Notable for the following:    Glucose, UA 500 (*)    All other components within  normal limits  CBG MONITORING, ED - Abnormal; Notable for the following:    Glucose-Capillary 376 (*)    All other components within normal limits  CBG MONITORING, ED - Abnormal; Notable for the following:    Glucose-Capillary 331 (*)    All other components within normal limits    Imaging Review No results found. I have personally reviewed and evaluated these images and lab results as part of my medical decision-making.    MDM   Final diagnoses:  Hyperglycemia  Dehydration    Hyperglycemia with pre-existing borderline diabetic status. Patient with poor dietary compliance, and marked obesity. Suspect prolonged hyperglycemic state with secondary elevation of creatinine. Anion gap is normal. Doubt serous bacterial infection or metabolic instability.  Nursing Notes Reviewed/ Care Coordinated Applicable Imaging Reviewed Interpretation of Laboratory Data incorporated into ED treatment  The patient appears reasonably screened and/or stabilized for discharge and I doubt any other medical condition or other [facility] requiring further screening, evaluation, or treatment in the ED at this time prior to discharge.  Plan: Home Medications- Metformin; Home Treatments- low carbohydrate diet, and drink plenty of fluids; return here if the recommended treatment, does not improve the symptoms; Recommended follow up- PCP follow-up one week     I personally performed the services described in this documentation, which was scribed in my presence. The recorded information has been reviewed and is accurate.      Daleen Bo, MD 09/11/15 [phone]

## 2015-09-10 NOTE — Discharge Instructions (Signed)
Be careful about all sugar, carbohydrate, intake. Avoid any sodas, which are not, "diet". Avoid all sweets, candies, cakes and cookies.   Dehydration, Adult Dehydration is a condition in which you do not have enough fluid or water in your body. It happens when you take in less fluid than you lose. Vital organs such as the kidneys, brain, and heart cannot function without a proper amount of fluids. Any loss of fluids from the body can cause dehydration.  Dehydration can range from mild to severe. This condition should be treated right away to help prevent it from becoming severe. CAUSES  This condition may be caused by:  Vomiting.  Diarrhea.  Excessive sweating, such as when exercising in hot or humid weather.  Not drinking enough fluid during strenuous exercise or during an illness.  Excessive urine output.  Fever.  Certain medicines. RISK FACTORS This condition is more likely to develop in:  People who are taking certain medicines that cause the body to lose excess fluid (diuretics).   People who have a chronic illness, such as diabetes, that may increase urination.  Older adults.   People who live at high altitudes.   People who participate in endurance sports.  SYMPTOMS  Mild Dehydration  Thirst.  Dry lips.  Slightly dry mouth.  Dry, warm skin. Moderate Dehydration  Very dry mouth.   Muscle cramps.   Dark urine and decreased urine production.   Decreased tear production.   Headache.   Light-headedness, especially when you stand up from a sitting position.  Severe Dehydration  Changes in skin.   Cold and clammy skin.   Skin does not spring back quickly when lightly pinched and released.   Changes in body fluids.   Extreme thirst.   No tears.   Not able to sweat when body temperature is high, such as in hot weather.   Minimal urine production.   Changes in vital signs.   Rapid, weak pulse (more than 100 beats per  minute when you are sitting still).   Rapid breathing.   Low blood pressure.   Other changes.   Sunken eyes.   Cold hands and feet.   Confusion.  Lethargy and difficulty being awakened.  Fainting (syncope).   Short-term weight loss.   Unconsciousness. DIAGNOSIS  This condition may be diagnosed based on your symptoms. You may also have tests to determine how severe your dehydration is. These tests may include:   Urine tests.   Blood tests.  TREATMENT  Treatment for this condition depends on the severity. Mild or moderate dehydration can often be treated at home. Treatment should be started right away. Do not wait until dehydration becomes severe. Severe dehydration needs to be treated at the hospital. Treatment for Mild Dehydration  Drinking plenty of water to replace the fluid you have lost.   Replacing minerals in your blood (electrolytes) that you may have lost.  Treatment for Moderate Dehydration  Consuming oral rehydration solution (ORS). Treatment for Severe Dehydration  Receiving fluid through an IV tube.   Receiving electrolyte solution through a feeding tube that is passed through your nose and into your stomach (nasogastric tube or NG tube).  Correcting any abnormalities in electrolytes. HOME CARE INSTRUCTIONS   Drink enough fluid to keep your urine clear or pale yellow.   Drink water or fluid slowly by taking small sips. You can also try sucking on ice cubes.  Have food or beverages that contain electrolytes. Examples include bananas and sports drinks.  Take  over-the-counter and prescription medicines only as told by your health care provider.   Prepare ORS according to the manufacturer's instructions. Take sips of ORS every 5 minutes until your urine returns to normal.  If you have vomiting or diarrhea, continue to try to drink water, ORS, or both.   If you have diarrhea, avoid:   Beverages that contain caffeine.   Fruit  juice.   Milk.   Carbonated soft drinks.  Do not take salt tablets. This can lead to the condition of having too much sodium in your body (hypernatremia).  SEEK MEDICAL CARE IF:  You cannot eat or drink without vomiting.  You have had moderate diarrhea during a period of more than 24 hours.  You have a fever. SEEK IMMEDIATE MEDICAL CARE IF:   You have extreme thirst.  You have severe diarrhea.  You have not urinated in 6-8 hours, or you have urinated only a small amount of very dark urine.  You have shriveled skin.  You are dizzy, confused, or both.   This information is not intended to replace advice given to you by your health care provider. Make sure you discuss any questions you have with your health care provider.   Document Released: 05/01/2005 Document Revised: 01/20/2015 Document Reviewed: 09/16/2014 Elsevier Interactive Patient Education 2016 [place].  Hyperglycemia Hyperglycemia occurs when the glucose (sugar) in your blood is too high. Hyperglycemia can happen for many reasons, but it most often happens to people who do not know they have diabetes or are not managing their diabetes properly.  CAUSES  Whether you have diabetes or not, there are other causes of hyperglycemia. Hyperglycemia can occur when you have diabetes, but it can also occur in other situations that you might not be as aware of, such as: Diabetes  If you have diabetes and are having problems controlling your blood glucose, hyperglycemia could occur because of some of the following reasons:  Not following your meal plan.  Not taking your diabetes medications or not taking it properly.  Exercising less or doing less activity than you normally do.  Being sick. Pre-diabetes  This cannot be ignored. Before people develop Type 2 diabetes, they almost always have "pre-diabetes." This is when your blood glucose levels are higher than normal, but not yet high enough to be diagnosed as  diabetes. Research has shown that some long-term damage to the body, especially the heart and circulatory system, may already be occurring during pre-diabetes. If you take action to manage your blood glucose when you have pre-diabetes, you may delay or prevent Type 2 diabetes from developing. Stress  If you have diabetes, you may be "diet" controlled or on oral medications or insulin to control your diabetes. However, you may find that your blood glucose is higher than usual in the hospital whether you have diabetes or not. This is often referred to as "stress hyperglycemia." Stress can elevate your blood glucose. This happens because of hormones put out by the body during times of stress. If stress has been the cause of your high blood glucose, it can be followed regularly by your caregiver. That way he/she can make sure your hyperglycemia does not continue to get worse or progress to diabetes. Steroids  Steroids are medications that act on the infection fighting system (immune system) to block inflammation or infection. One side effect can be a rise in blood glucose. Most people can produce enough extra insulin to allow for this rise, but for those who cannot, steroids  make blood glucose levels go even higher. It is not unusual for steroid treatments to "uncover" diabetes that is developing. It is not always possible to determine if the hyperglycemia will go away after the steroids are stopped. A special blood test called an A1c is sometimes done to determine if your blood glucose was elevated before the steroids were started. SYMPTOMS  Thirsty.  Frequent urination.  Dry mouth.  Blurred vision.  Tired or fatigue.  Weakness.  Sleepy.  Tingling in feet or leg. DIAGNOSIS  Diagnosis is made by monitoring blood glucose in one or all of the following ways:  A1c test. This is a chemical found in your blood.  Fingerstick blood glucose monitoring.  Laboratory results. TREATMENT  First,  knowing the cause of the hyperglycemia is important before the hyperglycemia can be treated. Treatment may include, but is not be limited to:  Education.  Change or adjustment in medications.  Change or adjustment in meal plan.  Treatment for an illness, infection, etc.  More frequent blood glucose monitoring.  Change in exercise plan.  Decreasing or stopping steroids.  Lifestyle changes. HOME CARE INSTRUCTIONS   Test your blood glucose as directed.  Exercise regularly. Your caregiver will give you instructions about exercise. Pre-diabetes or diabetes which comes on with stress is helped by exercising.  Eat wholesome, balanced meals. Eat often and at regular, fixed times. Your caregiver or nutritionist will give you a meal plan to guide your sugar intake.  Being at an ideal weight is important. If needed, losing as little as 10 to 15 pounds may help improve blood glucose levels. SEEK MEDICAL CARE IF:   You have questions about medicine, activity, or diet.  You continue to have symptoms (problems such as increased thirst, urination, or weight gain). SEEK IMMEDIATE MEDICAL CARE IF:   You are vomiting or have diarrhea.  Your breath smells fruity.  You are breathing faster or slower.  You are very sleepy or incoherent.  You have numbness, tingling, or pain in your feet or hands.  You have chest pain.  Your symptoms get worse even though you have been following your caregiver's orders.  If you have any other questions or concerns.   This information is not intended to replace advice given to you by your health care provider. Make sure you discuss any questions you have with your health care provider.   Document Released: 10/25/2000 Document Revised: 07/24/2011 Document Reviewed: 01/05/2015 Elsevier Interactive Patient Education 2016 [place] for Diabetes Mellitus Carbohydrate counting is a method for keeping track of the amount of  carbohydrates you eat. Eating carbohydrates naturally increases the level of sugar (glucose) in your blood, so it is important for you to know the amount that is okay for you to have in every meal. Carbohydrate counting helps keep the level of glucose in your blood within normal limits. The amount of carbohydrates allowed is different for every person. A dietitian can help you calculate the amount that is right for you. Once you know the amount of carbohydrates you can have, you can count the carbohydrates in the foods you want to eat. Carbohydrates are found in the following foods: Grains, such as breads and cereals. Dried beans and soy products. Starchy vegetables, such as potatoes, peas, and corn. Fruit and fruit juices. Milk and yogurt. Sweets and snack foods, such as cake, cookies, candy, chips, soft drinks, and fruit drinks. CARBOHYDRATE COUNTING There are two ways to count the carbohydrates in your food.  You can use either of the methods or a combination of both. Reading the "Nutrition Facts" on [place] The "Nutrition Facts" is an area that is included on the labels of almost all packaged food and beverages in the [place]. It includes the serving size of that food or beverage and information about the nutrients in each serving of the food, including the grams (g) of carbohydrate per serving.  Decide the number of servings of this food or beverage that you will be able to eat or drink. Multiply that number of servings by the number of grams of carbohydrate that is listed on the label for that serving. The total will be the amount of carbohydrates you will be having when you eat or drink this food or beverage. Learning Standard Serving Sizes of Food When you eat food that is not packaged or does not include "Nutrition Facts" on the label, you need to measure the servings in order to count the amount of carbohydrates.A serving of most carbohydrate-rich foods contains about 15 g of  carbohydrates. The following list includes serving sizes of carbohydrate-rich foods that provide 15 g ofcarbohydrate per serving:  1 slice of bread (1 oz) or 1 six-inch tortilla.   of a hamburger bun or English muffin. 4-6 crackers.  cup unsweetened dry cereal.   cup hot cereal.  cup rice or pasta.   cup mashed potatoes or  of a large baked potato. 1 cup fresh fruit or one small piece of fruit.   cup canned or frozen fruit or fruit juice. 1 cup milk.  cup plain fat-free yogurt or yogurt sweetened with artificial sweeteners.  cup cooked dried beans or starchy vegetable, such as peas, corn, or potatoes.  Decide the number of standard-size servings that you will eat. Multiply that number of servings by 15 (the grams of carbohydrates in that serving). For example, if you eat 2 cups of strawberries, you will have eaten 2 servings and 30 g of carbohydrates (2 servings x 15 g = 30 g). For foods such as soups and casseroles, in which more than one food is mixed in, you will need to count the carbohydrates in each food that is included. EXAMPLE OF CARBOHYDRATE COUNTING Sample Dinner 3 oz chicken breast.  cup of brown rice.  cup of corn. 1 cup milk.  1 cup strawberries with sugar-free whipped topping.  Carbohydrate Calculation Step 1: Identify the foods that contain carbohydrates:  Rice.  Corn.  Milk.  Strawberries. Step 2:Calculate the number of servings eaten of each:  2 servings of rice.  1 serving of corn.  1 serving of milk.  1 serving of strawberries. Step 3: Multiply each of those number of servings by 15 g:  2 servings of rice x 15 g = 30 g.  1 serving of corn x 15 g = 15 g.  1 serving of milk x 15 g = 15 g.  1 serving of strawberries x 15 g = 15 g. Step 4: Add together all of the amounts to find the total grams of carbohydrates eaten: 30 g + 15 g + 15 g + 15 g = 75 g.   This information is not intended to replace advice given to you by your health  care provider. Make sure you discuss any questions you have with your health care provider.   Document Released: 05/01/2005 Document Revised: 05/22/2014 Document Reviewed: 03/28/2013 Elsevier Interactive Patient Education [employer].

## 2015-09-10 NOTE — ED Notes (Signed)
PT states understanding of care given and follow up instructions.   

## 2015-09-10 NOTE — ED Notes (Addendum)
Pt was unaware of being diabetic, blood was drawn at [facility] this morning and found to be hypergylcemic.  Sent here for further evalutation.    Pt has history of DM but pt states "that was a long time ago and it went away"

## 2015-09-11 DIAGNOSIS — R279 Unspecified lack of coordination: Secondary | ICD-10-CM | POA: Diagnosis not present

## 2015-09-11 DIAGNOSIS — Z743 Need for continuous supervision: Secondary | ICD-10-CM | POA: Diagnosis not present

## 2015-09-13 DIAGNOSIS — D464 Refractory anemia, unspecified: Secondary | ICD-10-CM | POA: Diagnosis not present

## 2015-09-13 DIAGNOSIS — D649 Anemia, unspecified: Secondary | ICD-10-CM | POA: Diagnosis not present

## 2015-09-13 DIAGNOSIS — N183 Chronic kidney disease, stage 3 (moderate): Secondary | ICD-10-CM | POA: Diagnosis not present

## 2015-09-13 DIAGNOSIS — I10 Essential (primary) hypertension: Secondary | ICD-10-CM | POA: Diagnosis not present

## 2015-09-13 DIAGNOSIS — E119 Type 2 diabetes mellitus without complications: Secondary | ICD-10-CM | POA: Diagnosis not present

## 2015-09-14 DIAGNOSIS — F411 Generalized anxiety disorder: Secondary | ICD-10-CM | POA: Diagnosis not present

## 2015-09-14 DIAGNOSIS — F3132 Bipolar disorder, current episode depressed, moderate: Secondary | ICD-10-CM | POA: Diagnosis not present

## 2015-09-21 DIAGNOSIS — F411 Generalized anxiety disorder: Secondary | ICD-10-CM | POA: Diagnosis not present

## 2015-09-21 DIAGNOSIS — F3132 Bipolar disorder, current episode depressed, moderate: Secondary | ICD-10-CM | POA: Diagnosis not present

## 2015-09-30 DIAGNOSIS — E119 Type 2 diabetes mellitus without complications: Secondary | ICD-10-CM | POA: Diagnosis not present

## 2015-09-30 DIAGNOSIS — F17290 Nicotine dependence, other tobacco product, uncomplicated: Secondary | ICD-10-CM | POA: Diagnosis not present

## 2015-09-30 DIAGNOSIS — J449 Chronic obstructive pulmonary disease, unspecified: Secondary | ICD-10-CM | POA: Diagnosis not present

## 2015-09-30 DIAGNOSIS — G4733 Obstructive sleep apnea (adult) (pediatric): Secondary | ICD-10-CM | POA: Diagnosis not present

## 2015-10-05 DIAGNOSIS — F3132 Bipolar disorder, current episode depressed, moderate: Secondary | ICD-10-CM | POA: Diagnosis not present

## 2015-10-05 DIAGNOSIS — F411 Generalized anxiety disorder: Secondary | ICD-10-CM | POA: Diagnosis not present

## 2015-10-07 DIAGNOSIS — F17200 Nicotine dependence, unspecified, uncomplicated: Secondary | ICD-10-CM | POA: Diagnosis not present

## 2015-10-07 DIAGNOSIS — I10 Essential (primary) hypertension: Secondary | ICD-10-CM | POA: Diagnosis not present

## 2015-10-07 DIAGNOSIS — G4733 Obstructive sleep apnea (adult) (pediatric): Secondary | ICD-10-CM | POA: Diagnosis not present

## 2015-10-07 DIAGNOSIS — J449 Chronic obstructive pulmonary disease, unspecified: Secondary | ICD-10-CM | POA: Diagnosis not present

## 2015-10-11 DIAGNOSIS — R4182 Altered mental status, unspecified: Secondary | ICD-10-CM | POA: Diagnosis not present

## 2015-10-11 DIAGNOSIS — F17290 Nicotine dependence, other tobacco product, uncomplicated: Secondary | ICD-10-CM | POA: Diagnosis not present

## 2015-10-11 DIAGNOSIS — M2550 Pain in unspecified joint: Secondary | ICD-10-CM | POA: Diagnosis not present

## 2015-10-11 DIAGNOSIS — N189 Chronic kidney disease, unspecified: Secondary | ICD-10-CM | POA: Diagnosis not present

## 2015-10-11 DIAGNOSIS — M6281 Muscle weakness (generalized): Secondary | ICD-10-CM | POA: Diagnosis not present

## 2015-10-11 DIAGNOSIS — D539 Nutritional anemia, unspecified: Secondary | ICD-10-CM | POA: Diagnosis not present

## 2015-10-11 DIAGNOSIS — F319 Bipolar disorder, unspecified: Secondary | ICD-10-CM | POA: Diagnosis not present

## 2015-10-13 DIAGNOSIS — L97819 Non-pressure chronic ulcer of other part of right lower leg with unspecified severity: Secondary | ICD-10-CM | POA: Diagnosis not present

## 2015-10-19 DIAGNOSIS — F3132 Bipolar disorder, current episode depressed, moderate: Secondary | ICD-10-CM | POA: Diagnosis not present

## 2015-10-19 DIAGNOSIS — F411 Generalized anxiety disorder: Secondary | ICD-10-CM | POA: Diagnosis not present

## 2015-10-19 DIAGNOSIS — L97819 Non-pressure chronic ulcer of other part of right lower leg with unspecified severity: Secondary | ICD-10-CM | POA: Diagnosis not present

## 2015-10-20 DIAGNOSIS — L97819 Non-pressure chronic ulcer of other part of right lower leg with unspecified severity: Secondary | ICD-10-CM | POA: Diagnosis not present

## 2015-10-29 DIAGNOSIS — L97819 Non-pressure chronic ulcer of other part of right lower leg with unspecified severity: Secondary | ICD-10-CM | POA: Diagnosis not present

## 2015-11-02 DIAGNOSIS — E1121 Type 2 diabetes mellitus with diabetic nephropathy: Secondary | ICD-10-CM | POA: Diagnosis not present

## 2015-11-02 DIAGNOSIS — R062 Wheezing: Secondary | ICD-10-CM | POA: Diagnosis not present

## 2015-11-02 DIAGNOSIS — R600 Localized edema: Secondary | ICD-10-CM | POA: Diagnosis not present

## 2015-11-02 DIAGNOSIS — F17290 Nicotine dependence, other tobacco product, uncomplicated: Secondary | ICD-10-CM | POA: Diagnosis not present

## 2015-11-05 DIAGNOSIS — L97819 Non-pressure chronic ulcer of other part of right lower leg with unspecified severity: Secondary | ICD-10-CM | POA: Diagnosis not present

## 2015-11-06 DIAGNOSIS — F411 Generalized anxiety disorder: Secondary | ICD-10-CM | POA: Diagnosis not present

## 2015-11-06 DIAGNOSIS — F319 Bipolar disorder, unspecified: Secondary | ICD-10-CM | POA: Diagnosis not present

## 2015-11-06 DIAGNOSIS — F331 Major depressive disorder, recurrent, moderate: Secondary | ICD-10-CM | POA: Diagnosis not present

## 2015-11-08 DIAGNOSIS — I10 Essential (primary) hypertension: Secondary | ICD-10-CM | POA: Diagnosis not present

## 2015-11-08 DIAGNOSIS — G4733 Obstructive sleep apnea (adult) (pediatric): Secondary | ICD-10-CM | POA: Diagnosis not present

## 2015-11-08 DIAGNOSIS — J449 Chronic obstructive pulmonary disease, unspecified: Secondary | ICD-10-CM | POA: Diagnosis not present

## 2015-11-08 DIAGNOSIS — M1711 Unilateral primary osteoarthritis, right knee: Secondary | ICD-10-CM | POA: Diagnosis not present

## 2015-11-09 DIAGNOSIS — F3132 Bipolar disorder, current episode depressed, moderate: Secondary | ICD-10-CM | POA: Diagnosis not present

## 2015-11-09 DIAGNOSIS — F411 Generalized anxiety disorder: Secondary | ICD-10-CM | POA: Diagnosis not present

## 2015-11-09 DIAGNOSIS — L84 Corns and callosities: Secondary | ICD-10-CM | POA: Diagnosis not present

## 2015-11-09 DIAGNOSIS — E1051 Type 1 diabetes mellitus with diabetic peripheral angiopathy without gangrene: Secondary | ICD-10-CM | POA: Diagnosis not present

## 2015-11-10 DIAGNOSIS — I10 Essential (primary) hypertension: Secondary | ICD-10-CM | POA: Diagnosis not present

## 2015-11-10 DIAGNOSIS — E119 Type 2 diabetes mellitus without complications: Secondary | ICD-10-CM | POA: Diagnosis not present

## 2015-11-10 DIAGNOSIS — D649 Anemia, unspecified: Secondary | ICD-10-CM | POA: Diagnosis not present

## 2015-11-10 DIAGNOSIS — J449 Chronic obstructive pulmonary disease, unspecified: Secondary | ICD-10-CM | POA: Diagnosis not present

## 2015-11-16 DIAGNOSIS — L97819 Non-pressure chronic ulcer of other part of right lower leg with unspecified severity: Secondary | ICD-10-CM | POA: Diagnosis not present

## 2015-11-23 DIAGNOSIS — F411 Generalized anxiety disorder: Secondary | ICD-10-CM | POA: Diagnosis not present

## 2015-11-23 DIAGNOSIS — L97819 Non-pressure chronic ulcer of other part of right lower leg with unspecified severity: Secondary | ICD-10-CM | POA: Diagnosis not present

## 2015-11-23 DIAGNOSIS — F3132 Bipolar disorder, current episode depressed, moderate: Secondary | ICD-10-CM | POA: Diagnosis not present

## 2015-11-26 DIAGNOSIS — F319 Bipolar disorder, unspecified: Secondary | ICD-10-CM | POA: Diagnosis not present

## 2015-11-26 DIAGNOSIS — F411 Generalized anxiety disorder: Secondary | ICD-10-CM | POA: Diagnosis not present

## 2015-11-26 DIAGNOSIS — F331 Major depressive disorder, recurrent, moderate: Secondary | ICD-10-CM | POA: Diagnosis not present

## 2015-12-07 ENCOUNTER — Other Ambulatory Visit (HOSPITAL_COMMUNITY): Payer: Self-pay | Admitting: Respiratory Therapy

## 2015-12-07 DIAGNOSIS — I10 Essential (primary) hypertension: Secondary | ICD-10-CM | POA: Diagnosis not present

## 2015-12-07 DIAGNOSIS — M1711 Unilateral primary osteoarthritis, right knee: Secondary | ICD-10-CM | POA: Diagnosis not present

## 2015-12-07 DIAGNOSIS — J449 Chronic obstructive pulmonary disease, unspecified: Secondary | ICD-10-CM | POA: Diagnosis not present

## 2015-12-07 DIAGNOSIS — R0602 Shortness of breath: Secondary | ICD-10-CM

## 2015-12-07 DIAGNOSIS — L97819 Non-pressure chronic ulcer of other part of right lower leg with unspecified severity: Secondary | ICD-10-CM | POA: Diagnosis not present

## 2015-12-07 DIAGNOSIS — G4733 Obstructive sleep apnea (adult) (pediatric): Secondary | ICD-10-CM | POA: Diagnosis not present

## 2015-12-10 ENCOUNTER — Ambulatory Visit (HOSPITAL_COMMUNITY)
Admission: RE | Admit: 2015-12-10 | Discharge: 2015-12-10 | Disposition: A | Discharge status: Home / Self Care | Payer: Medicare Other | Source: Ambulatory Visit | Attending: Pulmonary Disease | Admitting: Pulmonary Disease

## 2015-12-10 DIAGNOSIS — E872 Acidosis: Secondary | ICD-10-CM | POA: Diagnosis not present

## 2015-12-10 DIAGNOSIS — J9622 Acute and chronic respiratory failure with hypercapnia: Secondary | ICD-10-CM | POA: Diagnosis not present

## 2015-12-10 DIAGNOSIS — N183 Chronic kidney disease, stage 3 (moderate): Secondary | ICD-10-CM | POA: Diagnosis not present

## 2015-12-10 DIAGNOSIS — J449 Chronic obstructive pulmonary disease, unspecified: Secondary | ICD-10-CM

## 2015-12-10 DIAGNOSIS — J9621 Acute and chronic respiratory failure with hypoxia: Secondary | ICD-10-CM | POA: Diagnosis not present

## 2015-12-10 DIAGNOSIS — R0602 Shortness of breath: Secondary | ICD-10-CM | POA: Insufficient documentation

## 2015-12-10 DIAGNOSIS — N179 Acute kidney failure, unspecified: Secondary | ICD-10-CM | POA: Diagnosis not present

## 2015-12-10 DIAGNOSIS — E1122 Type 2 diabetes mellitus with diabetic chronic kidney disease: Secondary | ICD-10-CM | POA: Diagnosis not present

## 2015-12-10 DIAGNOSIS — J441 Chronic obstructive pulmonary disease with (acute) exacerbation: Secondary | ICD-10-CM | POA: Diagnosis not present

## 2015-12-10 DIAGNOSIS — M2550 Pain in unspecified joint: Secondary | ICD-10-CM | POA: Diagnosis not present

## 2015-12-10 DIAGNOSIS — D696 Thrombocytopenia, unspecified: Secondary | ICD-10-CM | POA: Diagnosis not present

## 2015-12-10 DIAGNOSIS — E119 Type 2 diabetes mellitus without complications: Secondary | ICD-10-CM | POA: Diagnosis not present

## 2015-12-10 LAB — BLOOD GAS, ARTERIAL
ACID-BASE EXCESS: 11.2 mmol/L — AB (ref 0.0–2.0)
BICARBONATE: 33.8 meq/L — AB (ref 20.0–24.0)
FIO2: 0.21
O2 Saturation: 86.7 %
PH ART: 7.475 — AB (ref 7.350–7.450)
pCO2 arterial: 48.8 mmHg — ABNORMAL HIGH (ref 35.0–45.0)
pO2, Arterial: 51.6 mmHg — ABNORMAL LOW (ref 80.0–100.0)

## 2015-12-10 MED ORDER — ALBUTEROL SULFATE (2.5 MG/3ML) 0.083% IN NEBU
2.5000 mg | INHALATION_SOLUTION | Freq: Once | RESPIRATORY_TRACT | Status: AC
  Administered 2015-12-10: 2.5 mg via RESPIRATORY_TRACT

## 2015-12-12 DIAGNOSIS — I509 Heart failure, unspecified: Secondary | ICD-10-CM | POA: Diagnosis not present

## 2015-12-12 DIAGNOSIS — I517 Cardiomegaly: Secondary | ICD-10-CM | POA: Diagnosis not present

## 2015-12-12 LAB — PULMONARY FUNCTION TEST
DL/VA % pred: 115 %
DL/VA: 5.64 ml/min/mmHg/L
DLCO UNC % PRED: 53 %
DLCO UNC: 20.79 ml/min/mmHg
DLCO cor % pred: 54 %
DLCO cor: 21.15 ml/min/mmHg
FEF 25-75 PRE: 0.75 L/s
FEF 25-75 Post: 0.97 L/sec
FEF2575-%Change-Post: 29 %
FEF2575-%Pred-Post: 25 %
FEF2575-%Pred-Pre: 19 %
FEV1-%Change-Post: 9 %
FEV1-%PRED-POST: 34 %
FEV1-%PRED-PRE: 31 %
FEV1-POST: 1.55 L
FEV1-PRE: 1.42 L
FEV1FVC-%Change-Post: 0 %
FEV1FVC-%Pred-Pre: 80 %
FEV6-%CHANGE-POST: 8 %
FEV6-%PRED-POST: 44 %
FEV6-%Pred-Pre: 40 %
FEV6-PRE: 2.29 L
FEV6-Post: 2.5 L
FEV6FVC-%CHANGE-POST: 0 %
FEV6FVC-%PRED-PRE: 104 %
FEV6FVC-%Pred-Post: 103 %
FVC-%Change-Post: 9 %
FVC-%Pred-Post: 42 %
FVC-%Pred-Pre: 39 %
FVC-Post: 2.52 L
FVC-Pre: 2.29 L
POST FEV1/FVC RATIO: 62 %
POST FEV6/FVC RATIO: 99 %
PRE FEV1/FVC RATIO: 62 %
Pre FEV6/FVC Ratio: 100 %
RV % PRED: 156 %
RV: 3.67 L
TLC % pred: 73 %
TLC: 5.83 L

## 2015-12-13 ENCOUNTER — Emergency Department (HOSPITAL_COMMUNITY): Payer: Medicare Other

## 2015-12-13 ENCOUNTER — Encounter (HOSPITAL_COMMUNITY): Payer: Self-pay | Admitting: Cardiology

## 2015-12-13 ENCOUNTER — Inpatient Hospital Stay (HOSPITAL_COMMUNITY)
Admission: EM | Admit: 2015-12-13 | Discharge: 2015-12-19 | DRG: 190 | Disposition: A | Discharge status: Skilled Nursing Facility | Payer: Medicare Other | Attending: Family Medicine | Admitting: Family Medicine

## 2015-12-13 DIAGNOSIS — Z7401 Bed confinement status: Secondary | ICD-10-CM | POA: Diagnosis not present

## 2015-12-13 DIAGNOSIS — E1165 Type 2 diabetes mellitus with hyperglycemia: Secondary | ICD-10-CM | POA: Diagnosis present

## 2015-12-13 DIAGNOSIS — K7030 Alcoholic cirrhosis of liver without ascites: Secondary | ICD-10-CM | POA: Diagnosis present

## 2015-12-13 DIAGNOSIS — Z7982 Long term (current) use of aspirin: Secondary | ICD-10-CM

## 2015-12-13 DIAGNOSIS — E1122 Type 2 diabetes mellitus with diabetic chronic kidney disease: Secondary | ICD-10-CM | POA: Diagnosis present

## 2015-12-13 DIAGNOSIS — J449 Chronic obstructive pulmonary disease, unspecified: Secondary | ICD-10-CM | POA: Diagnosis present

## 2015-12-13 DIAGNOSIS — E872 Acidosis: Secondary | ICD-10-CM | POA: Diagnosis present

## 2015-12-13 DIAGNOSIS — N183 Chronic kidney disease, stage 3 unspecified: Secondary | ICD-10-CM | POA: Diagnosis present

## 2015-12-13 DIAGNOSIS — D696 Thrombocytopenia, unspecified: Secondary | ICD-10-CM | POA: Diagnosis not present

## 2015-12-13 DIAGNOSIS — J441 Chronic obstructive pulmonary disease with (acute) exacerbation: Secondary | ICD-10-CM | POA: Diagnosis not present

## 2015-12-13 DIAGNOSIS — J9621 Acute and chronic respiratory failure with hypoxia: Secondary | ICD-10-CM

## 2015-12-13 DIAGNOSIS — Z8249 Family history of ischemic heart disease and other diseases of the circulatory system: Secondary | ICD-10-CM | POA: Diagnosis not present

## 2015-12-13 DIAGNOSIS — G4733 Obstructive sleep apnea (adult) (pediatric): Secondary | ICD-10-CM | POA: Diagnosis present

## 2015-12-13 DIAGNOSIS — I509 Heart failure, unspecified: Secondary | ICD-10-CM | POA: Diagnosis not present

## 2015-12-13 DIAGNOSIS — Z6841 Body Mass Index (BMI) 40.0 and over, adult: Secondary | ICD-10-CM | POA: Diagnosis not present

## 2015-12-13 DIAGNOSIS — Z7951 Long term (current) use of inhaled steroids: Secondary | ICD-10-CM

## 2015-12-13 DIAGNOSIS — R0602 Shortness of breath: Secondary | ICD-10-CM | POA: Diagnosis not present

## 2015-12-13 DIAGNOSIS — E662 Morbid (severe) obesity with alveolar hypoventilation: Secondary | ICD-10-CM | POA: Diagnosis present

## 2015-12-13 DIAGNOSIS — K7460 Unspecified cirrhosis of liver: Secondary | ICD-10-CM | POA: Diagnosis not present

## 2015-12-13 DIAGNOSIS — I129 Hypertensive chronic kidney disease with stage 1 through stage 4 chronic kidney disease, or unspecified chronic kidney disease: Secondary | ICD-10-CM | POA: Diagnosis present

## 2015-12-13 DIAGNOSIS — J9622 Acute and chronic respiratory failure with hypercapnia: Secondary | ICD-10-CM | POA: Diagnosis present

## 2015-12-13 DIAGNOSIS — R0682 Tachypnea, not elsewhere classified: Secondary | ICD-10-CM | POA: Diagnosis not present

## 2015-12-13 DIAGNOSIS — F319 Bipolar disorder, unspecified: Secondary | ICD-10-CM | POA: Diagnosis present

## 2015-12-13 DIAGNOSIS — Z87891 Personal history of nicotine dependence: Secondary | ICD-10-CM | POA: Diagnosis not present

## 2015-12-13 DIAGNOSIS — J9690 Respiratory failure, unspecified, unspecified whether with hypoxia or hypercapnia: Secondary | ICD-10-CM | POA: Diagnosis present

## 2015-12-13 DIAGNOSIS — Z809 Family history of malignant neoplasm, unspecified: Secondary | ICD-10-CM | POA: Diagnosis not present

## 2015-12-13 DIAGNOSIS — T380X5A Adverse effect of glucocorticoids and synthetic analogues, initial encounter: Secondary | ICD-10-CM | POA: Diagnosis present

## 2015-12-13 DIAGNOSIS — F17210 Nicotine dependence, cigarettes, uncomplicated: Secondary | ICD-10-CM | POA: Diagnosis not present

## 2015-12-13 DIAGNOSIS — K219 Gastro-esophageal reflux disease without esophagitis: Secondary | ICD-10-CM | POA: Diagnosis present

## 2015-12-13 DIAGNOSIS — Z79891 Long term (current) use of opiate analgesic: Secondary | ICD-10-CM

## 2015-12-13 DIAGNOSIS — R05 Cough: Secondary | ICD-10-CM | POA: Diagnosis not present

## 2015-12-13 DIAGNOSIS — J9612 Chronic respiratory failure with hypercapnia: Secondary | ICD-10-CM | POA: Diagnosis present

## 2015-12-13 DIAGNOSIS — N179 Acute kidney failure, unspecified: Secondary | ICD-10-CM | POA: Diagnosis present

## 2015-12-13 DIAGNOSIS — Z9981 Dependence on supplemental oxygen: Secondary | ICD-10-CM | POA: Diagnosis not present

## 2015-12-13 DIAGNOSIS — R0689 Other abnormalities of breathing: Secondary | ICD-10-CM

## 2015-12-13 DIAGNOSIS — E119 Type 2 diabetes mellitus without complications: Secondary | ICD-10-CM

## 2015-12-13 DIAGNOSIS — Z794 Long term (current) use of insulin: Secondary | ICD-10-CM | POA: Diagnosis not present

## 2015-12-13 DIAGNOSIS — Z66 Do not resuscitate: Secondary | ICD-10-CM

## 2015-12-13 DIAGNOSIS — R062 Wheezing: Secondary | ICD-10-CM | POA: Diagnosis not present

## 2015-12-13 DIAGNOSIS — I10 Essential (primary) hypertension: Secondary | ICD-10-CM | POA: Diagnosis present

## 2015-12-13 LAB — CBC WITH DIFFERENTIAL/PLATELET
BASOS ABS: 0 10*3/uL (ref 0.0–0.1)
BASOS PCT: 0 %
Eosinophils Absolute: 0.1 10*3/uL (ref 0.0–0.7)
Eosinophils Relative: 0 %
HEMATOCRIT: 39.2 % (ref 39.0–52.0)
HEMOGLOBIN: 12.6 g/dL — AB (ref 13.0–17.0)
Lymphocytes Relative: 9 %
Lymphs Abs: 1.1 10*3/uL (ref 0.7–4.0)
MCH: 34.1 pg — ABNORMAL HIGH (ref 26.0–34.0)
MCHC: 32.1 g/dL (ref 30.0–36.0)
MCV: 106.2 fL — ABNORMAL HIGH (ref 78.0–100.0)
Monocytes Absolute: 0.8 10*3/uL (ref 0.1–1.0)
Monocytes Relative: 7 %
NEUTROS ABS: 9.9 10*3/uL — AB (ref 1.7–7.7)
NEUTROS PCT: 83 %
Platelets: 106 10*3/uL — ABNORMAL LOW (ref 150–400)
RBC: 3.69 MIL/uL — AB (ref 4.22–5.81)
RDW: 15.1 % (ref 11.5–15.5)
WBC: 11.9 10*3/uL — AB (ref 4.0–10.5)

## 2015-12-13 LAB — AMMONIA: Ammonia: 37 umol/L — ABNORMAL HIGH (ref 9–35)

## 2015-12-13 LAB — CBC
HEMATOCRIT: 37.2 % — AB (ref 39.0–52.0)
Hemoglobin: 12.1 g/dL — ABNORMAL LOW (ref 13.0–17.0)
MCH: 34.4 pg — ABNORMAL HIGH (ref 26.0–34.0)
MCHC: 32.5 g/dL (ref 30.0–36.0)
MCV: 105.7 fL — AB (ref 78.0–100.0)
PLATELETS: 106 10*3/uL — AB (ref 150–400)
RBC: 3.52 MIL/uL — AB (ref 4.22–5.81)
RDW: 15 % (ref 11.5–15.5)
WBC: 7.3 10*3/uL (ref 4.0–10.5)

## 2015-12-13 LAB — COMPREHENSIVE METABOLIC PANEL
ALK PHOS: 111 U/L (ref 38–126)
ALT: 54 U/L (ref 17–63)
ANION GAP: 10 (ref 5–15)
AST: 40 U/L (ref 15–41)
Albumin: 3.3 g/dL — ABNORMAL LOW (ref 3.5–5.0)
BILIRUBIN TOTAL: 2.4 mg/dL — AB (ref 0.3–1.2)
BUN: 39 mg/dL — ABNORMAL HIGH (ref 6–20)
CALCIUM: 8 mg/dL — AB (ref 8.9–10.3)
CO2: 35 mmol/L — AB (ref 22–32)
Chloride: 92 mmol/L — ABNORMAL LOW (ref 101–111)
Creatinine, Ser: 1.75 mg/dL — ABNORMAL HIGH (ref 0.61–1.24)
GFR, EST AFRICAN AMERICAN: 50 mL/min — AB (ref >60)
GFR, EST NON AFRICAN AMERICAN: 43 mL/min — AB (ref >60)
Glucose, Bld: 184 mg/dL — ABNORMAL HIGH (ref 65–99)
Potassium: 3.8 mmol/L (ref 3.5–5.1)
SODIUM: 137 mmol/L (ref 135–145)
TOTAL PROTEIN: 7.7 g/dL (ref 6.5–8.1)

## 2015-12-13 LAB — BLOOD GAS, ARTERIAL
ACID-BASE EXCESS: 9.6 mmol/L — AB (ref 0.0–2.0)
ACID-BASE EXCESS: 9.6 mmol/L — AB (ref 0.0–2.0)
Acid-Base Excess: 9.8 mmol/L — ABNORMAL HIGH (ref 0.0–2.0)
BICARBONATE: 31.5 meq/L — AB (ref 20.0–24.0)
Bicarbonate: 31.3 mEq/L — ABNORMAL HIGH (ref 20.0–24.0)
Bicarbonate: 32.5 mEq/L — ABNORMAL HIGH (ref 20.0–24.0)
DRAWN BY: 234301
Drawn by: 234301
Drawn by: 23534
Expiratory PAP: 8
FIO2: 0.5
INSPIRATORY PAP: 20
Mode: POSITIVE
O2 CONTENT: 5 L/min
O2 CONTENT: 50 L/min
O2 Content: 4 L/min
O2 SAT: 91.1 %
O2 SAT: 87.7 %
O2 Saturation: 98.4 %
PCO2 ART: 65 mmHg — AB (ref 35.0–45.0)
PO2 ART: 118 mmHg — AB (ref 80.0–100.0)
pCO2 arterial: 68.6 mmHg (ref 35.0–45.0)
pCO2 arterial: 56.4 mmHg — ABNORMAL HIGH (ref 35.0–45.0)
pH, Arterial: 7.334 — ABNORMAL LOW (ref 7.350–7.450)
pH, Arterial: 7.353 (ref 7.350–7.450)
pH, Arterial: 7.407 (ref 7.350–7.450)
pO2, Arterial: 67.9 mmHg — ABNORMAL LOW (ref 80.0–100.0)
pO2, Arterial: 58.5 mmHg — ABNORMAL LOW (ref 80.0–100.0)

## 2015-12-13 LAB — MRSA PCR SCREENING: MRSA BY PCR: NEGATIVE

## 2015-12-13 LAB — CREATININE, SERUM
Creatinine, Ser: 1.98 mg/dL — ABNORMAL HIGH (ref 0.61–1.24)
GFR calc non Af Amer: 37 mL/min — ABNORMAL LOW (ref >60)
GFR, EST AFRICAN AMERICAN: 43 mL/min — AB (ref >60)

## 2015-12-13 LAB — GLUCOSE, CAPILLARY
Glucose-Capillary: 258 mg/dL — ABNORMAL HIGH (ref 65–99)
Glucose-Capillary: 331 mg/dL — ABNORMAL HIGH (ref 65–99)
Glucose-Capillary: 350 mg/dL — ABNORMAL HIGH (ref 65–99)

## 2015-12-13 LAB — BRAIN NATRIURETIC PEPTIDE: B NATRIURETIC PEPTIDE 5: 124 pg/mL — AB (ref 0.0–100.0)

## 2015-12-13 LAB — TROPONIN I

## 2015-12-13 MED ORDER — METHYLPREDNISOLONE SODIUM SUCC 125 MG IJ SOLR: 125.0000 mg | Freq: Once | INTRAMUSCULAR | Status: DC

## 2015-12-13 MED ORDER — INSULIN GLARGINE 100 UNIT/ML ~~LOC~~ SOLN
20.0000 [IU] | Freq: Every day | SUBCUTANEOUS | Status: DC
  Administered 2015-12-13 – 2015-12-14 (×2): 20 [IU] via SUBCUTANEOUS
  Filled 2015-12-13 (×3): qty 0.2

## 2015-12-13 MED ORDER — GABAPENTIN 300 MG PO CAPS
300.0000 mg | ORAL_CAPSULE | Freq: Three times a day (TID) | ORAL | Status: DC
  Administered 2015-12-13 – 2015-12-19 (×17): 300 mg via ORAL
  Filled 2015-12-13 (×17): qty 1

## 2015-12-13 MED ORDER — ACETAMINOPHEN 325 MG PO TABS
650.0000 mg | ORAL_TABLET | ORAL | Status: DC | PRN
  Administered 2015-12-16: 650 mg via ORAL
  Filled 2015-12-13: qty 2

## 2015-12-13 MED ORDER — ATENOLOL 25 MG PO TABS
25.0000 mg | ORAL_TABLET | Freq: Every day | ORAL | Status: DC
  Administered 2015-12-14 – 2015-12-19 (×5): 25 mg via ORAL
  Filled 2015-12-13 (×5): qty 1

## 2015-12-13 MED ORDER — GUAIFENESIN ER 600 MG PO TB12
1200.0000 mg | ORAL_TABLET | Freq: Two times a day (BID) | ORAL | Status: DC
  Administered 2015-12-13 – 2015-12-19 (×12): 1200 mg via ORAL
  Filled 2015-12-13 (×12): qty 2

## 2015-12-13 MED ORDER — RIFAXIMIN 200 MG PO TABS
200.0000 mg | ORAL_TABLET | Freq: Every day | ORAL | Status: DC
  Administered 2015-12-14 – 2015-12-19 (×6): 200 mg via ORAL
  Filled 2015-12-13 (×8): qty 1

## 2015-12-13 MED ORDER — ASPIRIN 81 MG PO CHEW
81.0000 mg | CHEWABLE_TABLET | Freq: Every day | ORAL | Status: DC
  Administered 2015-12-14 – 2015-12-19 (×6): 81 mg via ORAL
  Filled 2015-12-13 (×6): qty 1

## 2015-12-13 MED ORDER — ARIPIPRAZOLE 10 MG PO TABS
20.0000 mg | ORAL_TABLET | Freq: Every day | ORAL | Status: DC
  Administered 2015-12-14 – 2015-12-19 (×6): 20 mg via ORAL
  Filled 2015-12-13 (×6): qty 2

## 2015-12-13 MED ORDER — IPRATROPIUM-ALBUTEROL 0.5-2.5 (3) MG/3ML IN SOLN
3.0000 mL | Freq: Four times a day (QID) | RESPIRATORY_TRACT | Status: DC
  Administered 2015-12-13 – 2015-12-19 (×23): 3 mL via RESPIRATORY_TRACT
  Filled 2015-12-13 (×23): qty 3

## 2015-12-13 MED ORDER — METHYLPREDNISOLONE SODIUM SUCC 125 MG IJ SOLR
60.0000 mg | Freq: Four times a day (QID) | INTRAMUSCULAR | Status: DC
  Administered 2015-12-13 – 2015-12-17 (×15): 60 mg via INTRAVENOUS
  Filled 2015-12-13 (×15): qty 2

## 2015-12-13 MED ORDER — HEPARIN SODIUM (PORCINE) 5000 UNIT/ML IJ SOLN
5000.0000 [IU] | Freq: Three times a day (TID) | INTRAMUSCULAR | Status: DC
  Administered 2015-12-13 – 2015-12-19 (×17): 5000 [IU] via SUBCUTANEOUS
  Filled 2015-12-13 (×17): qty 1

## 2015-12-13 MED ORDER — FLUTICASONE FUROATE-VILANTEROL 100-25 MCG/INH IN AEPB
1.0000 | INHALATION_SPRAY | Freq: Every day | RESPIRATORY_TRACT | Status: DC
  Administered 2015-12-14 – 2015-12-19 (×5): 1 via RESPIRATORY_TRACT
  Filled 2015-12-13 (×2): qty 28

## 2015-12-13 MED ORDER — CHLORHEXIDINE GLUCONATE 0.12 % MT SOLN
15.0000 mL | Freq: Two times a day (BID) | OROMUCOSAL | Status: DC
  Administered 2015-12-13 – 2015-12-19 (×11): 15 mL via OROMUCOSAL
  Filled 2015-12-13 (×10): qty 15

## 2015-12-13 MED ORDER — AZITHROMYCIN 250 MG PO TABS
250.0000 mg | ORAL_TABLET | Freq: Every day | ORAL | Status: AC
  Administered 2015-12-14 – 2015-12-17 (×4): 250 mg via ORAL
  Filled 2015-12-13 (×4): qty 1

## 2015-12-13 MED ORDER — CITALOPRAM HYDROBROMIDE 20 MG PO TABS
40.0000 mg | ORAL_TABLET | Freq: Every day | ORAL | Status: DC
  Administered 2015-12-13 – 2015-12-18 (×6): 40 mg via ORAL
  Filled 2015-12-13 (×6): qty 2

## 2015-12-13 MED ORDER — CETYLPYRIDINIUM CHLORIDE 0.05 % MT LIQD
7.0000 mL | Freq: Two times a day (BID) | OROMUCOSAL | Status: DC
  Administered 2015-12-13 – 2015-12-19 (×5): 7 mL via OROMUCOSAL

## 2015-12-13 MED ORDER — INSULIN ASPART 100 UNIT/ML ~~LOC~~ SOLN
0.0000 [IU] | Freq: Three times a day (TID) | SUBCUTANEOUS | Status: DC
  Administered 2015-12-13: 15 [IU] via SUBCUTANEOUS

## 2015-12-13 MED ORDER — LACTULOSE 10 GM/15ML PO SOLN
14.5000 g | Freq: Three times a day (TID) | ORAL | Status: DC
  Administered 2015-12-13 – 2015-12-19 (×17): 14.5 g via ORAL
  Filled 2015-12-13 (×18): qty 30

## 2015-12-13 MED ORDER — SODIUM CHLORIDE 0.9 % IV SOLN: 250.0000 mL | INTRAVENOUS | Status: DC | PRN

## 2015-12-13 MED ORDER — FUROSEMIDE 10 MG/ML IJ SOLN
60.0000 mg | Freq: Two times a day (BID) | INTRAMUSCULAR | Status: DC
  Administered 2015-12-13 – 2015-12-15 (×4): 60 mg via INTRAVENOUS
  Filled 2015-12-13 (×4): qty 6

## 2015-12-13 MED ORDER — SODIUM CHLORIDE 0.9% FLUSH: 3.0000 mL | INTRAVENOUS | Status: DC | PRN

## 2015-12-13 MED ORDER — ALBUTEROL SULFATE (2.5 MG/3ML) 0.083% IN NEBU
2.5000 mg | INHALATION_SOLUTION | RESPIRATORY_TRACT | Status: DC | PRN
  Administered 2015-12-17: 2.5 mg via RESPIRATORY_TRACT
  Filled 2015-12-13: qty 3

## 2015-12-13 MED ORDER — IPRATROPIUM-ALBUTEROL 0.5-2.5 (3) MG/3ML IN SOLN
3.0000 mL | Freq: Once | RESPIRATORY_TRACT | Status: AC
  Administered 2015-12-13: 3 mL via RESPIRATORY_TRACT
  Filled 2015-12-13: qty 3

## 2015-12-13 MED ORDER — INSULIN ASPART 100 UNIT/ML ~~LOC~~ SOLN
0.0000 [IU] | SUBCUTANEOUS | Status: DC
  Administered 2015-12-13 – 2015-12-14 (×4): 20 [IU] via SUBCUTANEOUS
  Administered 2015-12-14: 7 [IU] via SUBCUTANEOUS
  Administered 2015-12-14: 11 [IU] via SUBCUTANEOUS
  Administered 2015-12-14 – 2015-12-15 (×2): 7 [IU] via SUBCUTANEOUS
  Administered 2015-12-15 (×3): 20 [IU] via SUBCUTANEOUS
  Administered 2015-12-15: 11 [IU] via SUBCUTANEOUS
  Administered 2015-12-15: 20 [IU] via SUBCUTANEOUS
  Administered 2015-12-15 – 2015-12-16 (×3): 15 [IU] via SUBCUTANEOUS

## 2015-12-13 MED ORDER — ONDANSETRON HCL 4 MG/2ML IJ SOLN: 4.0000 mg | Freq: Four times a day (QID) | INTRAMUSCULAR | Status: DC | PRN

## 2015-12-13 MED ORDER — TRAZODONE HCL 50 MG PO TABS
150.0000 mg | ORAL_TABLET | Freq: Every day | ORAL | Status: DC
  Administered 2015-12-13 – 2015-12-18 (×6): 150 mg via ORAL
  Filled 2015-12-13 (×6): qty 3

## 2015-12-13 MED ORDER — FUROSEMIDE 10 MG/ML IJ SOLN
80.0000 mg | Freq: Once | INTRAMUSCULAR | Status: AC
  Administered 2015-12-13: 80 mg via INTRAVENOUS
  Filled 2015-12-13: qty 8

## 2015-12-13 MED ORDER — SODIUM CHLORIDE 0.9% FLUSH
3.0000 mL | Freq: Two times a day (BID) | INTRAVENOUS | Status: DC
  Administered 2015-12-14 – 2015-12-19 (×11): 3 mL via INTRAVENOUS

## 2015-12-13 MED ORDER — AZITHROMYCIN 250 MG PO TABS
500.0000 mg | ORAL_TABLET | Freq: Every day | ORAL | Status: AC
  Administered 2015-12-13: 500 mg via ORAL
  Filled 2015-12-13: qty 2

## 2015-12-13 NOTE — ED Notes (Signed)
CRITICAL VALUE ALERT  Critical value received:  Ph 7.33, pc02 68.6, P02 67.9, Bicarb 31.3, sat 91%  Date of notification:  12/13/2015  Time of notification:  [id number]  Critical value read back:Yes.    Nurse who received alert:  [facility]  MD notified (1st page):  Dr. Rogene Houston  Time of first page: [phone]  MD notified (2nd page):Dr. Rogene Houston  Time of second page:  Responding MD:  Dr. Rogene Houston  Time MD responded:  [phone]

## 2015-12-13 NOTE — H&P (Signed)
History and Physical    Jorge [name] [id number] DOB: October 17, 1965 DOA: 12/13/2015  PCP: Hilbert Corrigan, MD   Patient coming from: [facility]  Chief Complaint: shortness of breath  HPI: Jorge [name] is a 50 y.o. male with medical history significant of multiple medical problems, including COPD, cirrhosis, who is non ambulatory and resides at a SNF. He is brought in with shortness of breath. He is unable to contribute to history due to somnolence so history is obtained from chart. He comes to ED with shortness of breath for the past 1-2 weeks. He has associated cough, congestion headache and bilateral eye discharge. He uses oxygen as needed at home. He also has chronic bilateral leg swelling. No chest pain was reported  ED Course: in ED he received neb treatments, lasix and steroids. Chest xray indicated pulmonary edema. BNP was minimally elevated, although this may be low due to body habitus. He initially improved with neb treatments, but then became somnolent. ABG was done which did show some CO2 retention. He was started on bipap and referred for admission.  Review of Systems: could not assess due to mental status  Past Medical History:  Diagnosis Date  . Cirrhosis ([place]) sleep apnea  . COPD (chronic obstructive pulmonary disease) ([place])   . Diabetes mellitus without complication ([place])   . Hypertension   . Renal disorder    stage II  . Renal insufficiency   . Sleep apnea   . Suicide attempt ([place])   . Syncope   . Tobacco abuse     Past Surgical History:  Procedure Laterality Date  . CHOLECYSTECTOMY    . LEG SURGERY Left      reports that he quit smoking about 4 weeks ago. His smoking use included Cigarettes. He smoked 0.50 packs per day. He has never used smokeless tobacco. He reports that he does not drink alcohol or use drugs.  Allergies  Allergen Reactions  . Penicillins Anaphylaxis    Patient states he is not allergic to anything;  Pt says it's his twin  brother that has this allergy.    Family History  Problem Relation Age of Onset  . Heart disease Father   . Cancer Mother     breast     Prior to Admission medications   Medication Sig Start Date End Date Taking? Authorizing Provider  ARIPiprazole (ABILIFY) 20 MG tablet Take 20 mg by mouth daily.   Yes Historical Provider, MD  aspirin 81 MG tablet Take 81 mg by mouth daily.   Yes Historical Provider, MD  atenolol (TENORMIN) 25 MG tablet Take 25 mg by mouth daily.    Yes Historical Provider, MD  calcium citrate (CALCITRATE - DOSED IN MG ELEMENTAL CALCIUM) 950 MG tablet Take 1 tablet by mouth daily.   Yes Historical Provider, MD  cholecalciferol (VITAMIN D) 1000 UNITS tablet Take 1,000 Units by mouth daily. Reported on 05/03/2015   Yes Historical Provider, MD  citalopram (CELEXA) 40 MG tablet Take 40 mg by mouth at bedtime.  12/03/13  Yes Historical Provider, MD  Fe Bisgly-Succ-C-Thre-B12-FA (IRON-150 PO) Take 1 tablet by mouth daily.   Yes Historical Provider, MD  fluticasone furoate-vilanterol (BREO ELLIPTA) 100-25 MCG/INH AEPB Inhale 1 puff into the lungs daily.   Yes Historical Provider, MD  gabapentin (NEURONTIN) 300 MG capsule Take 300 mg by mouth 3 (three) times daily. 12/03/13  Yes Historical Provider, MD  guaiFENesin (MUCINEX) 600 MG 12 hr tablet Take 600 mg by mouth 2 (two) times  daily.    Yes Historical Provider, MD  insulin aspart (NOVOLOG FLEXPEN) 100 UNIT/ML FlexPen Inject 2-10 Units into the skin 4 (four) times daily -  before meals and at bedtime. 150-200=2units Call np/md if less than 70 201-250=4units 251-300=6units 301-350=8units 351-400=10units Greater than 400 call np and md   Yes Historical Provider, MD  Insulin Glargine (LANTUS SOLOSTAR) 100 UNIT/ML Solostar Pen Inject 20 Units into the skin at bedtime.   Yes Historical Provider, MD  ipratropium-albuterol (DUONEB) 0.5-2.5 (3) MG/3ML SOLN Take 3 mLs by nebulization 3 (three) times daily. 11/01/14  Yes Rosita Fire, MD   lactulose, encephalopathy, (CHRONULAC) 10 GM/15ML SOLN Take 14.5 g by mouth 3 (three) times daily. 89mls 3 times daily   Yes Historical Provider, MD  Melatonin 5 MG CAPS Take 1 capsule by mouth at bedtime.   Yes Historical Provider, MD  metFORMIN (GLUCOPHAGE) 500 MG tablet Take 1 tablet (500 mg total) by mouth 2 (two) times daily with a meal. 09/10/15  Yes Daleen Bo, MD  OXYGEN Inhale 3 L into the lungs daily as needed (for breathing).    Yes Historical Provider, MD  rifaximin (XIFAXAN) 200 MG tablet Take 200 mg by mouth daily.   Yes Historical Provider, MD  torsemide (DEMADEX) 20 MG tablet Take 40 mg by mouth 2 (two) times daily.    Yes Historical Provider, MD  traZODone (DESYREL) 50 MG tablet Take 150 mg by mouth at bedtime.   Yes Historical Provider, MD  HYDROcodone-acetaminophen (NORCO/VICODIN) 5-325 MG tablet Take 1 tablet by mouth every 6 (six) hours.     Historical Provider, MD  polyethylene glycol (MIRALAX / GLYCOLAX) packet Take 17 g by mouth daily as needed for mild constipation or moderate constipation.     Historical Provider, MD  PROAIR HFA 108 (90 BASE) MCG/ACT inhaler Inhale 2 puffs into the lungs every 6 (six) hours as needed for wheezing or shortness of breath. Shortness of breath/wheeze 12/05/13   Historical Provider, MD  sennosides-docusate sodium (SENOKOT-S) 8.6-50 MG tablet Take 1 tablet by mouth daily as needed for constipation.     Historical Provider, MD    Physical Exam: Vitals:   12/13/15 1443 12/13/15 1500 12/13/15 1536 12/13/15 1540  BP:  110/78    Pulse: 78     Resp: 20 20    Temp:   98.8 F (37.1 C)   TempSrc:   Axillary   SpO2: 92%     Weight:    (!) 168.5 kg (371 lb 7.6 oz)  Height:    6\' 3"  (1.905 m)      Constitutional: NAD, calm, comfortable Vitals:   12/13/15 1443 12/13/15 1500 12/13/15 1536 12/13/15 1540  BP:  110/78    Pulse: 78     Resp: 20 20    Temp:   98.8 F (37.1 C)   TempSrc:   Axillary   SpO2: 92%     Weight:    (!) 168.5 kg  (371 lb 7.6 oz)  Height:    6\' 3"  (1.905 m)   Eyes: PERRL, lids and conjunctivae normal ENMT: could not assess due to bipap mask  Neck: normal, supple, no masses, no thyromegaly Respiratory: clear to auscultation bilaterally, no wheezing, no crackles. Normal respiratory effort. No accessory muscle use.  Cardiovascular: Regular rate and rhythm, no murmurs / rubs / gallops. 1+ extremity edema. 2+ pedal pulses. No carotid bruits.  Abdomen: no tenderness, no masses palpated. Obese. No hepatosplenomegaly. Bowel sounds positive.  Musculoskeletal: no clubbing / cyanosis. No  joint deformity upper and lower extremities. Good ROM, no contractures. Normal muscle tone.  Skin: no rashes, lesions, ulcers. No induration Neurologic: limited due to somnolence. No gross deficits Psychiatric: cannot assess due to mental status  Labs on Admission: I have personally reviewed following labs and imaging studies  CBC:  Recent Labs Lab 12/13/15 0853  WBC 11.9*  NEUTROABS 9.9*  HGB 12.6*  HCT 39.2  MCV 106.2*  PLT [id number]*   Basic Metabolic Panel:  Recent Labs Lab 12/13/15 0853  NA 137  K 3.8  CL 92*  CO2 35*  GLUCOSE 184*  BUN 39*  CREATININE 1.75*  CALCIUM 8.0*   GFR: Estimated Creatinine Clearance: 83.4 mL/min (by C-G formula based on SCr of 1.75 mg/dL). Liver Function Tests:  Recent Labs Lab 12/13/15 0853  AST 40  ALT 54  ALKPHOS 111  BILITOT 2.4*  PROT 7.7  ALBUMIN 3.3*   No results for input(s): LIPASE, AMYLASE in the last 168 hours. No results for input(s): AMMONIA in the last 168 hours. Coagulation Profile: No results for input(s): INR, PROTIME in the last 168 hours. Cardiac Enzymes:  Recent Labs Lab 12/13/15 0901  TROPONINI <0.03   BNP (last 3 results) No results for input(s): PROBNP in the last 8760 hours. HbA1C: No results for input(s): HGBA1C in the last 72 hours. CBG:  Recent Labs Lab 12/13/15 1548  GLUCAP 331*   Lipid Profile: No results for input(s):  CHOL, HDL, LDLCALC, TRIG, CHOLHDL, LDLDIRECT in the last 72 hours. Thyroid Function Tests: No results for input(s): TSH, T4TOTAL, FREET4, T3FREE, THYROIDAB in the last 72 hours. Anemia Panel: No results for input(s): VITAMINB12, FOLATE, FERRITIN, TIBC, IRON, RETICCTPCT in the last 72 hours. Urine analysis:    Component Value Date/Time   COLORURINE YELLOW 09/10/2015 2121   APPEARANCEUR CLEAR 09/10/2015 2121   APPEARANCEUR Clear 03/25/2013 1630   LABSPEC 1.010 09/10/2015 2121   LABSPEC 1.023 03/25/2013 1630   PHURINE 6.0 09/10/2015 2121   GLUCOSEU 500 (A) 09/10/2015 2121   GLUCOSEU Negative 03/25/2013 1630   HGBUR NEGATIVE 09/10/2015 2121   BILIRUBINUR NEGATIVE 09/10/2015 2121   BILIRUBINUR Negative 03/25/2013 [place] 09/10/2015 2121   PROTEINUR NEGATIVE 09/10/2015 2121   UROBILINOGEN 4.0 (H) 12/21/2014 1618   NITRITE NEGATIVE 09/10/2015 2121   LEUKOCYTESUR NEGATIVE 09/10/2015 2121   LEUKOCYTESUR Negative 03/25/2013 1630   Sepsis Labs: !!!!!!!!!!!!!!!!!!!!!!!!!!!!!!!!!!!!!!!!!!!! @LABRCNTIP (procalcitonin:4,lacticidven:4) )No results found for this or any previous visit (from the past 240 hour(s)).   Radiological Exams on Admission: Dg Chest 1 View  Result Date: 12/13/2015 CLINICAL DATA:  Shortness of breath and cough for 2 weeks EXAM: CHEST 1 VIEW COMPARISON:  December 21, 2014 FINDINGS: There is slight generalized interstitial pulmonary edema. No airspace consolidation. There is cardiomegaly with pulmonary venous hypertension. No adenopathy. No bone lesions. IMPRESSION: Pulmonary vascular congestion with mild interstitial edema. Suspect a degree of congestive heart failure. No airspace consolidation. Electronically Signed   By: Lowella Grip III M.D.   On: 12/13/2015 09:11   EKG: Independently reviewed. Sinus rhythm, no acute changes  Assessment/Plan Active Problems:   COPD exacerbation (HCC)   Cirrhosis (HCC)   CKD (chronic kidney disease) stage 3, GFR  30-59 ml/min   Essential hypertension   GERD without esophagitis   Thrombocytopenia (HCC)   Acute on chronic respiratory failure with hypoxia (HCC)   DNR (do not resuscitate)   Diabetes mellitus ([place])   CHF (congestive heart failure) ([place])   Respiratory failure ([place])    1.Acute CHF.  Repeat echo has been ordered. CXR indicates pulmonary edema. He has been started on lasix. bnp may be falsely lower in the setting of morbid obesity. Continue to monitor intake and output. He is already on BB. Not a candidate for ACE due to renal dysfunction  2. COPD exacerbation. Start on steroids, nebs and antibiotics. Pulmonary hygiene  3. Acute on chronic respiratory failure with hypoxia and hypercapnea. Likely related to CHF/COPD. Started on bipap with elevated pCO2. Continue current treatments and try to wean oxygen. Pulmonology consult  4. Cirrhosis, per chart, alcoholic cirrhosis. Continue on lactulose, xifaxan. Will check ammonia level since he is still somnolent.  5. Diabetes. Continue basal insulin and start on SSI. Hold oral agents  6. CKD stage 3. Creatinine appears to be near baseline. Continue monitor with diuresis  7. Thrombocytopenia. Likely related to cirrhosis. Continue to follow. No evidence of bleeding.  8. HTN. Continue outpatient regimen.    DVT prophylaxis: heparin Code Status: DNR Family Communication: no family present Disposition Plan: discharge back to SNF when improved Consults called: Pulmonology Admission status: inpatient, SDU   Tamalyn Wadsworth MD [facility] Pager [phone]  If 7PM-7AM, please contact night-coverage [email or web address] Password [facility]  12/13/2015, 5:43 PM

## 2015-12-13 NOTE — Progress Notes (Signed)
[place] Progress Note Patient Name: Jorge [name] [facility] DOB: October 25, 1965 MRN: [id number]   Date of Service  12/13/2015  HPI/Events of Note  COPD exac BIPAP, stable on camera check  eICU Interventions  No eICU intervention needed     Intervention Category Evaluation Type: New Patient Evaluation  Simonne Maffucci 12/13/2015, 4:03 PM

## 2015-12-13 NOTE — ED Notes (Signed)
Pt resting with eyes shut.  On bi pap .  No distress.

## 2015-12-13 NOTE — ED Provider Notes (Signed)
[facility] DEPT Provider Note   CSN: [id number] Arrival date & time: 12/13/15  [id number]  First MD Initiated Contact with Patient 12/13/15 0840     By signing my name below, I, Emmanuella Mensah, attest that this documentation has been prepared under the direction and in the presence of Fredia Sorrow, MD. Electronically Signed: Judithann Sauger, ED Scribe. 12/13/15. 10:04 AM.    History   Chief Complaint Chief Complaint  Patient presents with  . Shortness of Breath    HPI Comments: Jorge [name] is a 50 y.o. male with a hx of COPD, hypertension, and DM  brought in by ambulance from [place], who presents to the Emergency Department complaining of ongoing gradually worsening moderate SOB onset 2 weeks ago, worsening yesterday. Pt reports associated non-productive cough, congestion, subjective fever, headache, bilateral eye discharge and nausea. Pt was given an albuterol treatment and 125 mg Solu-medrol by EMS en route which provided mild relief. Pt states that he uses 2 L of home O2 but pt is currently on 4L. No other alleviating factors noted. He presents with chronic bilateral leg swelling. Pt denies any chest pain or vomiting.     The history is provided by the patient. No language interpreter was used.  Shortness of Breath  This is a new problem. The current episode started more than 1 week ago. The problem has been gradually worsening. Associated symptoms include a fever, headaches, cough, rash and leg swelling. Pertinent negatives include no rhinorrhea, no sore throat, no chest pain, no vomiting and no abdominal pain. He has tried beta-agonist inhalers for the symptoms. Associated medical issues include COPD.    Past Medical History:  Diagnosis Date  . Cirrhosis ([place]) sleep apnea  . COPD (chronic obstructive pulmonary disease) ([place])   . Diabetes mellitus without complication ([place])   . Hypertension   . Renal disorder    stage II  . Renal insufficiency   . Sleep apnea   .  Suicide attempt ([place])   . Syncope   . Tobacco abuse     Patient Active Problem List   Diagnosis Date Noted  . Pressure ulcer 12/23/2014  . Syncope 12/23/2014  . Anemia 12/21/2014  . Thrombocytopenia ([name]) 10/27/2014  . Hyperglycemia 10/27/2014  . COPD with exacerbation ([place]) 10/27/2014  . Acute respiratory failure ([place]) 10/27/2014  . Sleep apnea   . Tobacco abuse   . Insomnia 10/03/2014  . Chronic pain 10/03/2014  . GERD without esophagitis 10/03/2014  . Chronic obstructive pulmonary disease with acute exacerbation ([place])   . Renal insufficiency   . Pain in the chest   . Essential hypertension   . COPD exacerbation ([place]) 08/29/2014  . Chest pain 08/29/2014  . CKD (chronic kidney disease) 08/29/2014  . Acute respiratory failure with hypoxia ([place]) 08/29/2014  . Cirrhosis ([place])   . Hypertension     Past Surgical History:  Procedure Laterality Date  . CHOLECYSTECTOMY    . LEG SURGERY Left        Home Medications    Prior to Admission medications   Medication Sig Start Date End Date Taking? Authorizing Provider  ARIPiprazole (ABILIFY) 20 MG tablet Take 20 mg by mouth daily.   Yes Historical Provider, MD  aspirin 81 MG tablet Take 81 mg by mouth daily.   Yes Historical Provider, MD  atenolol (TENORMIN) 25 MG tablet Take 25 mg by mouth daily.    Yes Historical Provider, MD  calcium citrate (CALCITRATE - DOSED IN MG ELEMENTAL CALCIUM) 950 MG tablet  Take 1 tablet by mouth daily.   Yes Historical Provider, MD  cholecalciferol (VITAMIN D) 1000 UNITS tablet Take 1,000 Units by mouth daily. Reported on 05/03/2015   Yes Historical Provider, MD  citalopram (CELEXA) 40 MG tablet Take 40 mg by mouth at bedtime.  12/03/13  Yes Historical Provider, MD  Fe Bisgly-Succ-C-Thre-B12-FA (IRON-150 PO) Take 1 tablet by mouth daily.   Yes Historical Provider, MD  fluticasone furoate-vilanterol (BREO ELLIPTA) 100-25 MCG/INH AEPB Inhale 1 puff into the lungs daily.   Yes Historical Provider, MD    gabapentin (NEURONTIN) 300 MG capsule Take 300 mg by mouth 3 (three) times daily. 12/03/13  Yes Historical Provider, MD  guaiFENesin (MUCINEX) 600 MG 12 hr tablet Take 600 mg by mouth 2 (two) times daily.    Yes Historical Provider, MD  insulin aspart (NOVOLOG FLEXPEN) 100 UNIT/ML FlexPen Inject 2-10 Units into the skin 4 (four) times daily -  before meals and at bedtime. 150-200=2units Call np/md if less than 70 201-250=4units 251-300=6units 301-350=8units 351-400=10units Greater than 400 call np and md   Yes Historical Provider, MD  Insulin Glargine (LANTUS SOLOSTAR) 100 UNIT/ML Solostar Pen Inject 20 Units into the skin at bedtime.   Yes Historical Provider, MD  ipratropium-albuterol (DUONEB) 0.5-2.5 (3) MG/3ML SOLN Take 3 mLs by nebulization 3 (three) times daily. 11/01/14  Yes Rosita Fire, MD  lactulose, encephalopathy, (CHRONULAC) 10 GM/15ML SOLN Take 14.5 g by mouth 3 (three) times daily. 36mls 3 times daily   Yes Historical Provider, MD  Melatonin 5 MG CAPS Take 1 capsule by mouth at bedtime.   Yes Historical Provider, MD  metFORMIN (GLUCOPHAGE) 500 MG tablet Take 1 tablet (500 mg total) by mouth 2 (two) times daily with a meal. 09/10/15  Yes Daleen Bo, MD  OXYGEN Inhale 3 L into the lungs daily as needed (for breathing).    Yes Historical Provider, MD  rifaximin (XIFAXAN) 200 MG tablet Take 200 mg by mouth daily.   Yes Historical Provider, MD  torsemide (DEMADEX) 20 MG tablet Take 40 mg by mouth 2 (two) times daily.    Yes Historical Provider, MD  traZODone (DESYREL) 50 MG tablet Take 150 mg by mouth at bedtime.   Yes Historical Provider, MD  doxycycline (VIBRA-TABS) 100 MG tablet Take 1 tablet (100 mg total) by mouth 2 (two) times daily. Patient not taking: Reported on 12/13/2015 07/16/15   Carlyle Basques, MD  HYDROcodone-acetaminophen (NORCO/VICODIN) 5-325 MG tablet Take 1 tablet by mouth every 6 (six) hours.     Historical Provider, MD  polyethylene glycol (MIRALAX / GLYCOLAX)  packet Take 17 g by mouth daily as needed for mild constipation or moderate constipation.     Historical Provider, MD  PROAIR HFA 108 (90 BASE) MCG/ACT inhaler Inhale 2 puffs into the lungs every 6 (six) hours as needed for wheezing or shortness of breath. Shortness of breath/wheeze 12/05/13   Historical Provider, MD  sennosides-docusate sodium (SENOKOT-S) 8.6-50 MG tablet Take 1 tablet by mouth daily as needed for constipation.     Historical Provider, MD    Family History Family History  Problem Relation Age of Onset  . Heart disease Father   . Cancer Mother     breast    Social History Social History  Substance Use Topics  . Smoking status: Current Every Day Smoker    Packs/day: 0.50    Types: Cigarettes  . Smokeless tobacco: Not on file     Comment: slowing down  . Alcohol use No  Allergies   Penicillins   Review of Systems Review of Systems  Constitutional: Positive for fever. Negative for chills.  HENT: Positive for congestion. Negative for rhinorrhea and sore throat.   Eyes: Positive for discharge. Negative for visual disturbance.  Respiratory: Positive for cough and shortness of breath.   Cardiovascular: Positive for leg swelling. Negative for chest pain.  Gastrointestinal: Positive for nausea. Negative for abdominal pain, diarrhea and vomiting.  Genitourinary: Negative for dysuria and hematuria.  Musculoskeletal: Positive for back pain.  Skin: Positive for rash.  Neurological: Positive for headaches.  Hematological: Does not bruise/bleed easily.  Psychiatric/Behavioral: Negative for confusion.     Physical Exam Updated Vital Signs BP (!) 126/51   Pulse 89   Temp 98 F (36.7 C) (Oral)   Resp 25   Ht 6\' 3"  (1.905 m)   Wt (!) 171.9 kg   SpO2 93%   BMI 47.37 kg/m   Physical Exam  Constitutional: He is oriented to person, place, and time. He appears well-developed and well-nourished. No distress.  HENT:  Head: Normocephalic and atraumatic.    Mouth/Throat: Oropharynx is clear and moist.  Eyes: Conjunctivae are normal. Pupils are equal, round, and reactive to light. Right eye exhibits discharge. Left eye exhibits discharge.  Bilateral eye discharge EOM somewhat decreased  Neck: Neck supple. No tracheal deviation present.  Cardiovascular: Normal rate, regular rhythm and normal heart sounds.   No murmur heard. Pulmonary/Chest: Effort normal. No respiratory distress.  Decreased breath sounds bilaterally with wheezing  Abdominal: He exhibits distension. There is no tenderness.  Musculoskeletal: Normal range of motion.  Bilateral leg swelling with overlying skin changes consistent with his chronic issues  Neurological: He is alert and oriented to person, place, and time.  Skin: Skin is warm and dry.  Chronic rash to skin  Psychiatric: He has a normal mood and affect. His behavior is normal.  Nursing note and vitals reviewed.    ED Treatments / Results  DIAGNOSTIC STUDIES: Oxygen Saturation is 96% on RA, normal by my interpretation.    COORDINATION OF CARE: 10:02 AM- Pt advised of plan for treatment and pt agrees.    Labs (all labs ordered are listed, but only abnormal results are displayed) Labs Reviewed  COMPREHENSIVE METABOLIC PANEL - Abnormal; Notable for the following:       Result Value   Chloride 92 (*)    CO2 35 (*)    Glucose, Bld 184 (*)    BUN 39 (*)    Creatinine, Ser 1.75 (*)    Calcium 8.0 (*)    Albumin 3.3 (*)    Total Bilirubin 2.4 (*)    GFR calc non Af Amer 43 (*)    GFR calc Af Amer 50 (*)    All other components within normal limits  CBC WITH DIFFERENTIAL/PLATELET - Abnormal; Notable for the following:    WBC 11.9 (*)    RBC 3.69 (*)    Hemoglobin 12.6 (*)    MCV 106.2 (*)    MCH 34.1 (*)    Platelets 106 (*)    Neutro Abs 9.9 (*)    All other components within normal limits  BRAIN NATRIURETIC PEPTIDE - Abnormal; Notable for the following:    B Natriuretic Peptide 124.0 (*)    All  other components within normal limits  BLOOD GAS, ARTERIAL - Abnormal; Notable for the following:    pH, Arterial 7.334 (*)    pCO2 arterial 68.6 (*)    pO2, Arterial 67.9 (*)  Bicarbonate 31.3 (*)    Acid-Base Excess 9.6 (*)    All other components within normal limits  BLOOD GAS, ARTERIAL - Abnormal; Notable for the following:    pCO2 arterial 65.0 (*)    pO2, Arterial 58.5 (*)    Bicarbonate 31.5 (*)    Acid-Base Excess 9.6 (*)    All other components within normal limits  TROPONIN I    EKG  EKG Interpretation  Date/Time:  Monday December 13 2015 08:37:46 EDT Ventricular Rate:  80 PR Interval:    QRS Duration: 104 QT Interval:  379 QTC Calculation: 438 R Axis:   50 Text Interpretation:  Sinus rhythm Low voltage, precordial leads Anteroseptal infarct, old Borderline T abnormalities, inferior leads Baseline wander in lead(s) V1 Confirmed by Rogene Houston  MD, Sheretha Shadd [id number]) on 12/13/2015 1:42:35 PM       Radiology Dg Chest 1 View  Result Date: 12/13/2015 CLINICAL DATA:  Shortness of breath and cough for 2 weeks EXAM: CHEST 1 VIEW COMPARISON:  December 21, 2014 FINDINGS: There is slight generalized interstitial pulmonary edema. No airspace consolidation. There is cardiomegaly with pulmonary venous hypertension. No adenopathy. No bone lesions. IMPRESSION: Pulmonary vascular congestion with mild interstitial edema. Suspect a degree of congestive heart failure. No airspace consolidation. Electronically Signed   By: Lowella Grip III M.D.   On: 12/13/2015 09:11   Procedures Procedures (including critical care time)  Medications Ordered in ED Medications  ipratropium-albuterol (DUONEB) 0.5-2.5 (3) MG/3ML nebulizer solution 3 mL (3 mLs Nebulization Given 12/13/15 0935)  furosemide (LASIX) injection 80 mg (80 mg Intravenous Given 12/13/15 1009)     Initial Impression / Assessment and Plan / ED Course  Fredia Sorrow, MD has reviewed the triage vital signs and the nursing  notes.  Pertinent labs & imaging results that were available during my care of the patient were reviewed by me and considered in my medical decision making (see chart for details).  Clinical Course   CRITICAL CARE Performed by: Fredia Sorrow Total critical care time:  45 minutes Critical care time was exclusive of separately billable procedures and treating other patients. Critical care was necessary to treat or prevent imminent or life-threatening deterioration. Critical care was time spent personally by me on the following activities: development of treatment plan with patient and/or surrogate as well as nursing, discussions with consultants, evaluation of patient's response to treatment, examination of patient, obtaining history from patient or surrogate, ordering and performing treatments and interventions, ordering and review of laboratory studies, ordering and review of radiographic studies, pulse oximetry and re-evaluation of patient's condition.  Patient brought in by EMS for shortness of breath. Patient known to have COPD. Patient did seem to improve some with albuterol Atrovent nebulizers 3 and as well as Solu-Medrol given by EMS. Patient's initial blood gas did show of some respiratory failure with elevated carbon dioxide levels. Patient was drowsy. Patient was placed on BiPAP. On BiPAP patient seemed to show some improvement but he removed the BiPAP himself. Repeated the arterial blood gas which showed improvements in pH his P 02 and some improvement in on his PCO2.  Counseled patient is 90 to go back on the BiPAP he is agreed to do that. He will need to be admitted. Chest x-ray raise some concerns for some mild pulmonary edema. Patient was given Lasix patient's troponin was negative. Think the problem is predominantly the COPD exacerbation.  As mentioned above patient's troponin was negative.  We'll discuss with hospitalist for admission to stepdown unit.  Patient is definitely a  lot more alert than he was when he first arrived.  Final Clinical Impressions(s) / ED Diagnoses   Final diagnoses:  COPD exacerbation ([place])  Hypercapnia   I personally performed the services described in this documentation, which was scribed in my presence. The recorded information has been reviewed and is accurate.      New Prescriptions New Prescriptions   No medications on file     Fredia Sorrow, MD 12/13/15 1419

## 2015-12-13 NOTE — ED Notes (Signed)
edp at bedside  

## 2015-12-13 NOTE — ED Notes (Signed)
CRITICAL VALUE ALERT  Critical value received:  Ph 7.35, Pc02 65.0, bicarb 31.5, PO2 58.5, sat 87.7   Date of notification:  12/13/2015  Time of notification: [id number]  Critical value read back:Yes.    Nurse who received alert:  [facility] RN  MD notified (1st page):  Dr. Rogene Houston  Time of first page:  [phone]  MD notified (2nd page):  Time of second page:  Responding MD:  Dr. Rogene Houston  Time MD responded:  [phone]

## 2015-12-13 NOTE — ED Notes (Signed)
Pt pulled bi pap off.  Pt wants to know where he is and what happened to him?

## 2015-12-13 NOTE — Progress Notes (Signed)
Patient alert and oriented. RN needed to give meds. Taken off BIPAP and placed on 3 lpm nasal cannula. Will put patient back on BIPAP around 00:00.

## 2015-12-13 NOTE — ED Notes (Signed)
Respiratory in ,  Placing pt back on bi pap.

## 2015-12-13 NOTE — ED Triage Notes (Signed)
Sob times 2 weeks.  Worse since yesterday.  EMS gave duo-neb and albuteral  treatment ,  Also gave 125mg  solumedrol.  Pt has non productive cough.  Also c/o dark urine.  CBG 166.  89% on 2 literrs oxygen

## 2015-12-14 ENCOUNTER — Inpatient Hospital Stay (HOSPITAL_COMMUNITY): Payer: Medicare Other

## 2015-12-14 DIAGNOSIS — Z794 Long term (current) use of insulin: Secondary | ICD-10-CM

## 2015-12-14 DIAGNOSIS — I509 Heart failure, unspecified: Secondary | ICD-10-CM

## 2015-12-14 DIAGNOSIS — E118 Type 2 diabetes mellitus with unspecified complications: Secondary | ICD-10-CM

## 2015-12-14 LAB — ECHOCARDIOGRAM COMPLETE
AV Area VTI index: 0.6 cm2/m2
AV Area mean vel: 1.98 cm2
AV Peak grad: 15 mmHg
AV VEL mean LVOT/AV: 0.7
AV peak Index: 0.62
AV pk vel: 196 cm/s
AVAREAMEANVIN: 0.65 cm2/m2
AVAREAVTI: 1.91 cm2
AVG: 7 mmHg
Ao pk vel: 0.67 m/s
CHL CUP AV VEL: 1.84
DOP CAL AO MEAN VELOCITY: 117 cm/s
E decel time: 243 msec
E/e' ratio: 7.81
FS: 33 % (ref 28–44)
HEIGHTINCHES: 75 in
IVS/LV PW RATIO, ED: 1.03
LA diam end sys: 46 mm
LA diam index: 1.5 cm/m2
LA vol A4C: 72.7 ml
LASIZE: 46 mm
LAVOL: 61 mL
LAVOLIN: 19.9 mL/m2
LDCA: 2.84 cm2
LV E/e'average: 7.81
LV e' LATERAL: 13.7 cm/s
LVEEMED: 7.81
LVOT VTI: 28 cm
LVOT peak grad rest: 7 mmHg
LVOTD: 19 mm
LVOTPV: 132 cm/s
LVOTSV: 80 mL
LVOTVTI: 0.65 cm
MV Dec: 243
MV Peak grad: 5 mmHg
MVPKAVEL: 84.4 m/s
MVPKEVEL: 107 m/s
PW: 11.1 mm — AB (ref 0.6–1.1)
RV TAPSE: 33.2 mm
TDI e' lateral: 13.7
TDI e' medial: 10.1
VTI: 43.2 cm
Valve area index: 0.6
Valve area: 1.84 cm2
WEIGHTICAEL: 5936.55 [oz_av]

## 2015-12-14 LAB — GLUCOSE, CAPILLARY
GLUCOSE-CAPILLARY: 244 mg/dL — AB (ref 65–99)
GLUCOSE-CAPILLARY: 246 mg/dL — AB (ref 65–99)
GLUCOSE-CAPILLARY: 367 mg/dL — AB (ref 65–99)
GLUCOSE-CAPILLARY: 377 mg/dL — AB (ref 65–99)
Glucose-Capillary: 399 mg/dL — ABNORMAL HIGH (ref 65–99)
Glucose-Capillary: 440 mg/dL — ABNORMAL HIGH (ref 65–99)

## 2015-12-14 LAB — BASIC METABOLIC PANEL
ANION GAP: 8 (ref 5–15)
BUN: 45 mg/dL — ABNORMAL HIGH (ref 6–20)
CALCIUM: 8.4 mg/dL — AB (ref 8.9–10.3)
CHLORIDE: 93 mmol/L — AB (ref 101–111)
CO2: 38 mmol/L — AB (ref 22–32)
Creatinine, Ser: 1.6 mg/dL — ABNORMAL HIGH (ref 0.61–1.24)
GFR calc non Af Amer: 48 mL/min — ABNORMAL LOW (ref >60)
GFR, EST AFRICAN AMERICAN: 56 mL/min — AB (ref >60)
Glucose, Bld: 234 mg/dL — ABNORMAL HIGH (ref 65–99)
POTASSIUM: 3.7 mmol/L (ref 3.5–5.1)
Sodium: 139 mmol/L (ref 135–145)

## 2015-12-14 MED ORDER — PERFLUTREN LIPID MICROSPHERE
1.0000 mL | INTRAVENOUS | Status: AC | PRN
  Administered 2015-12-14: 4 mL via INTRAVENOUS
  Filled 2015-12-14: qty 10

## 2015-12-14 NOTE — Clinical Social Work Note (Signed)
Clinical Social Work Assessment  Patient Details  Name: Jorge [name] MRN: [id number] Date of Birth: 28-Dec-1965  Date of referral:  12/14/15               Reason for consult:  Discharge Planning                Permission sought to share information with:  Facility Art therapist granted to share information::  Yes, Verbal Permission Granted  Name::        Agency::  [facility]  Relationship::  facility  Contact Information:     Housing/Transportation Living arrangements for the past 2 months:  [place] of Information:  Patient, Facility Patient Interpreter Needed:  None Criminal Activity/Legal Involvement Pertinent to Current Situation/Hospitalization:  No - Comment as needed Significant Relationships:  Siblings Lives with:  Facility Resident Do you feel safe going back to the place where you live?  Yes Need for family participation in patient care:  No (Coment)  Care giving concerns:  None reported. Pt is long term resident at [name].    Social Worker assessment / plan:  CSW met with pt at bedside. Pt alert and oriented and has been a resident at [facility] for about a year. He reports his brother is his best support and visits every Sunday at facility. Pt has been trying to get in touch with him but has been unable to reach him. RN in ICU called [facility] and no contact information on their chart for brother. Pt uses a wheelchair at baseline. Pt had legal guardian listed in chart. However, per [facility] pt makes own decisions and does not have legal guardian. Debbie at [facility] indicates pt is nursing level of care and okay to return.   Employment status:  Disabled (Comment on whether or not currently receiving Disability) Insurance information:  Medicare PT Recommendations:  Not assessed at this time Information / Referral to community resources:  Other (Comment Required) (Return to [facility])  Patient/Family's Response to care:  Pt requests return to  [facility] when medically stable.   Patient/Family's Understanding of and Emotional Response to Diagnosis, Current Treatment, and Prognosis:  Pt is aware of admission diagnosis and treatment plan.   Emotional Assessment Appearance:  Appears older than stated age Attitude/Demeanor/Rapport:  Other (Cooperative) Affect (typically observed):  Appropriate Orientation:  Oriented to Self, Oriented to Place, Oriented to  Time, Oriented to Situation Alcohol / Substance use:  Not Applicable Psych involvement (Current and /or in the community):  No (Comment)  Discharge Needs  Concerns to be addressed:  Discharge Planning Concerns Readmission within the last 30 days:  No Current discharge risk:  None Barriers to Discharge:  No Barriers Identified   Salome Arnt, [place] 12/14/2015, 1:22 PM [phone]

## 2015-12-14 NOTE — Progress Notes (Signed)
PROGRESS NOTE    Jorge [name]  [id number] DOB: [birth year] DOA: 12/13/2015 PCP: Hilbert Corrigan, MD Outpatient Specialists: Pulmonology, Dr. Luan Pulling Infectious Disease, Dr. Baxter Flattery   Brief Narrative:  38 yom presented to the ED from [facility] with complaints of shortness of breath for 1-2 weeks which is felt to be related to COPD exacerbation, with some element of CHF. While in the ED, he received nebulizer treatments, lasix, and steroids. He initially improved with nebulizers, but then became somnolent. ABG was done and revealed CO2 retention. He was started on BiPAP and referred for admission. Pulmonology was consulted.   Assessment & Plan:   Active Problems:   COPD exacerbation (HCC)   Cirrhosis (HCC)   CKD (chronic kidney disease) stage 3, GFR 30-59 ml/min   Essential hypertension   GERD without esophagitis   Thrombocytopenia (HCC)   Acute on chronic respiratory failure with hypoxia (HCC)   DNR (do not resuscitate)   Diabetes mellitus ([place])   CHF (congestive heart failure) ([place])   Respiratory failure ([place])  1. Acute CHF, likely diastolic. Repeat echo has been ordered. CXR indicates pulmonary edema. He has been started on lasix. bnp may be falsely lower in the setting of morbid obesity. Continue to monitor intake and output. He is already on BB. Not a candidate for ACE due to renal dysfunction. Still has evidence of volume overload. Continue current treatments.  2. COPD exacerbation. He is still short of breath and continues to wheeze. Continue steroids, nebs and antibiotics. Pulmonary hygiene  3. Acute on chronic respiratory failure with hypoxia and hypercapnea. Likely related to CHF/COPD. Started on bipap with elevated pCO2. Overall blood gas and mental status have improved. Continue current treatments and try to wean oxygen. Pulmonology input appreciated.   4. Cirrhosis, per chart, alcoholic cirrhosis. Continue on lactulose, xifaxan. Ammonia level is not  elevated  5. Diabetes. Continue basal insulin and SSI. Hold oral agents  6. CKD stage 3. Creatinine appears to be near baseline. Continue monitor with diuresis. Mild improvement.  7. Thrombocytopenia. Likely related to cirrhosis. Continue to follow. No evidence of bleeding.  8. HTN. Continue outpatient regimen.   DVT prophylaxis: heparin Code Status: DNR Family Communication: no family present Disposition Plan: discharge back to SNF when improved  Consultants:   Pulmonology  Procedures:   BiPAP  Antimicrobials:   Zithromax 7/31 >>    Subjective: Complains of cough. Still short of breath and wheezing  Objective: Vitals:   12/14/15 0200 12/14/15 0303 12/14/15 0400 12/14/15 0500  BP: 139/82  (!) 146/83   Pulse: 66  (!) 59 71  Resp: 16  17 (!) 21  Temp:   98 F (36.7 C)   TempSrc:   Axillary   SpO2: 95% 93% 94% 91%  Weight:    (!) 168.3 kg (371 lb 0.6 oz)  Height:        Intake/Output Summary (Last 24 hours) at 12/14/15 0541 Last data filed at 12/13/15 2000  Gross per 24 hour  Intake              240 ml  Output                0 ml  Net              240 ml   Filed Weights   12/13/15 0843 12/13/15 1540 12/14/15 0500  Weight: (!) 171.9 kg (379 lb) (!) 168.5 kg (371 lb 7.6 oz) (!) 168.3 kg (371 lb 0.6 oz)  Examination:  General exam: Appears calm and comfortable  Respiratory system: bilateral wheezing with increased respiratory effort Cardiovascular system: S1 & S2 heard, RRR. No JVD, murmurs, rubs, gallops or clicks. 1+pedal edema. Gastrointestinal system: Abdomen is nondistended, soft and nontender. No organomegaly or masses felt. Normal bowel sounds heard. Central nervous system: Alert and oriented. No focal neurological deficits. Extremities: Symmetric 5 x 5 power. Skin: No rashes, lesions or ulcers Psychiatry: Judgement and insight appear normal. Mood & affect appropriate.     Data Reviewed: I have personally reviewed following labs and imaging  studies  CBC:  Recent Labs Lab 12/13/15 0853 12/13/15 1755  WBC 11.9* 7.3  NEUTROABS 9.9*  --   HGB 12.6* 12.1*  HCT 39.2 37.2*  MCV 106.2* 105.7*  PLT 106* [id number]*   Basic Metabolic Panel:  Recent Labs Lab 12/13/15 0853 12/13/15 1755  NA 137  --   K 3.8  --   CL 92*  --   CO2 35*  --   GLUCOSE 184*  --   BUN 39*  --   CREATININE 1.75* 1.98*  CALCIUM 8.0*  --    GFR: Estimated Creatinine Clearance: 73.7 mL/min (by C-G formula based on SCr of 1.98 mg/dL). Liver Function Tests:  Recent Labs Lab 12/13/15 0853  AST 40  ALT 54  ALKPHOS 111  BILITOT 2.4*  PROT 7.7  ALBUMIN 3.3*   No results for input(s): LIPASE, AMYLASE in the last 168 hours.  Recent Labs Lab 12/13/15 1755  AMMONIA 37*   Coagulation Profile: No results for input(s): INR, PROTIME in the last 168 hours. Cardiac Enzymes:  Recent Labs Lab 12/13/15 0901  TROPONINI <0.03   CBG:  Recent Labs Lab 12/13/15 1548 12/13/15 1947 12/13/15 2348 12/14/15 0407  GLUCAP 331* 350* 258* 244*   Urine analysis:    Component Value Date/Time   COLORURINE YELLOW 09/10/2015 2121   APPEARANCEUR CLEAR 09/10/2015 2121   APPEARANCEUR Clear 03/25/2013 1630   LABSPEC 1.010 09/10/2015 2121   LABSPEC 1.023 03/25/2013 1630   PHURINE 6.0 09/10/2015 2121   GLUCOSEU 500 (A) 09/10/2015 2121   GLUCOSEU Negative 03/25/2013 1630   HGBUR NEGATIVE 09/10/2015 2121   BILIRUBINUR NEGATIVE 09/10/2015 2121   BILIRUBINUR Negative 03/25/2013 1630   KETONESUR NEGATIVE 09/10/2015 2121   PROTEINUR NEGATIVE 09/10/2015 2121   UROBILINOGEN 4.0 (H) 12/21/2014 1618   NITRITE NEGATIVE 09/10/2015 2121   LEUKOCYTESUR NEGATIVE 09/10/2015 2121   LEUKOCYTESUR Negative 03/25/2013 1630   Sepsis Labs: @LABRCNTIP (procalcitonin:4,lacticidven:4)  ) Recent Results (from the past 240 hour(s))  MRSA PCR Screening     Status: None   Collection Time: 12/13/15  3:00 PM  Result Value Ref Range Status   MRSA by PCR NEGATIVE NEGATIVE Final      Comment:        The GeneXpert MRSA Assay (FDA approved for NASAL specimens only), is one component of a comprehensive MRSA colonization surveillance program. It is not intended to diagnose MRSA infection nor to guide or monitor treatment for MRSA infections.          Radiology Studies: Dg Chest 1 View  Result Date: 12/13/2015 CLINICAL DATA:  Shortness of breath and cough for 2 weeks EXAM: CHEST 1 VIEW COMPARISON:  December 21, 2014 FINDINGS: There is slight generalized interstitial pulmonary edema. No airspace consolidation. There is cardiomegaly with pulmonary venous hypertension. No adenopathy. No bone lesions. IMPRESSION: Pulmonary vascular congestion with mild interstitial edema. Suspect a degree of congestive heart failure. No airspace consolidation. Electronically Signed  By: Lowella Grip III M.D.   On: 12/13/2015 09:11       Scheduled Meds: . antiseptic oral rinse  7 mL Mouth Rinse q12n4p  . ARIPiprazole  20 mg Oral Daily  . aspirin  81 mg Oral Daily  . atenolol  25 mg Oral Daily  . azithromycin  250 mg Oral Daily  . chlorhexidine  15 mL Mouth Rinse BID  . citalopram  40 mg Oral QHS  . fluticasone furoate-vilanterol  1 puff Inhalation Daily  . furosemide  60 mg Intravenous BID  . gabapentin  300 mg Oral TID  . guaiFENesin  1,200 mg Oral BID  . heparin  5,000 Units Subcutaneous Q8H  . insulin aspart  0-20 Units Subcutaneous Q4H  . insulin glargine  20 Units Subcutaneous QHS  . ipratropium-albuterol  3 mL Nebulization Q6H  . lactulose  14.5 g Oral TID  . methylPREDNISolone (SOLU-MEDROL) injection  60 mg Intravenous Q6H  . rifaximin  200 mg Oral Daily  . sodium chloride flush  3 mL Intravenous Q12H  . traZODone  150 mg Oral QHS   Continuous Infusions:    LOS: 1 day    Time spent: 25 minutes   Kathie Dike, MD [facility] Pager [phone]  If 7PM-7AM, please contact night-coverage [email or web address] Password [email or web address] 12/14/2015, 5:41 AM

## 2015-12-14 NOTE — Progress Notes (Signed)
*  PRELIMINARY RESULTS* Echocardiogram 2D Echocardiogram has been performed with Definity.  Jorge [name] 12/14/2015, 3:23 PM

## 2015-12-14 NOTE — Progress Notes (Signed)
RN called [facility] to get a correct phone number for pt's brother, Mattew, as the one we had on file was incorrect. Was informed that they do not have a # for Rodman Key. RN then called Levie Heritage, who is listed as pt's legal guardian. Levie Heritage denied being pt's legal guardian and stated that she had never signed any paperwork.

## 2015-12-14 NOTE — Progress Notes (Signed)
Inpatient Diabetes Program Recommendations  AACE/ADA: New Consensus Statement on Inpatient Glycemic Control (2015)  Target Ranges:  Prepandial:   less than 140 mg/dL      Peak postprandial:   less than 180 mg/dL (1-2 hours)      Critically ill patients:  140 - 180 mg/dL   Lab Results  Component Value Date   GLUCAP 246 (H) 12/14/2015   HGBA1C 5.0 03/26/2013    Review of Glycemic Control  Results for Jorge [name], Jorge [name] (MRN [id number]) as of 12/14/2015 10:06  Ref. Range 12/13/2015 15:48 12/13/2015 19:47 12/13/2015 23:48 12/14/2015 04:07 12/14/2015 08:16  Glucose-Capillary Latest Ref Range: 65 - 99 mg/dL 331 (H) 350 (H) 258 (H) 244 (H) 246 (H)    Diabetes history: Type 2 Outpatient Diabetes medications: Novolog 2-10 units qid, Lantus 20 units qhs Current orders for Inpatient glycemic control: Lantus 20 units qhs, Novolog 0-20 units q4h, * prednisone 60 mg q6h  Inpatient Diabetes Program Recommendations:    Per ADA recommendations "consider performing an A1C on all patients with diabetes or hyperglycemia admitted to the hospital if not performed in the prior 3 months".  Fasting CBG 246mg /dl- Consider increasing Lantus to 30 units qhs.  Since the patient is eating, consider adding Novolog 5 units tid with meals (hold if he eats less than 50%)- continue Novolog correction as ordered.   Gentry Fitz, RN, BA, MHA, CDE Diabetes Coordinator Inpatient Diabetes Program  [phone] (Team Pager) [phone] ([place]) 12/14/2015 10:14 AM

## 2015-12-14 NOTE — Care Management Note (Signed)
Case Management Note  Patient Details  Name: Jorge [name] [facility] MRN: [id number] Date of Birth: Sep 17, 1965  Subjective/Objective:                  Pt admitted with resp distress. Pt is from [facility]. Anticipate return to SNF when appropriate.  Action/Plan: No CM needs anticipated.   Expected Discharge Date:    12/14/2015              Expected Discharge Plan:  Skilled Nursing Facility  In-House Referral:  Clinical Social Work  Discharge planning Services  CM Consult  Post Acute Care Choice:    Choice offered to:     DME Arranged:    DME Agency:     HH Arranged:    [place] Agency:     Status of Service:  Completed, signed off  If discussed at [employer], dates discussed:    Additional Comments:  Sherald Barge, RN 12/14/2015, 1:41 PM

## 2015-12-14 NOTE — Progress Notes (Deleted)
*  PRELIMINARY RESULTS* Echocardiogram 2D Echocardiogram has been performed.  Jorge [name] 12/14/2015, 2:07 PM

## 2015-12-14 NOTE — Consult Note (Signed)
Consult requested by: [facility] Consult requested for respiratory failure/COPD exacerbation:  HPI: This is a 50 year old who has known severe COPD. He has multiple other medical problems including sleep apnea, chronic hypoxic respiratory failure on oxygen. He has cirrhosis of the liver diabetes hypertension renal insufficiency and what I believe is bipolar disease. I had seen him in my office about a month ago and scheduled very function testing. He was hopeful to have surgery on his leg but his pulmonary function test showed severe COPD and he was hypoxic on his blood gas. I sent an order to his skilled care facility for him to be on oxygen during the day as well but he has continued to have increasing problems. He's had increased shortness of breath he's coughing and he was noted to have hypoxic hypercapnic respiratory failure in the emergency department. He was started on BiPAP and admitted. He is still coughing significantly. He is off BiPAP now. He is able to talk to me.  Past Medical History:  Diagnosis Date  . Cirrhosis ([place]) sleep apnea  . COPD (chronic obstructive pulmonary disease) ([place])   . Diabetes mellitus without complication ([place])   . Hypertension   . Renal disorder    stage II  . Renal insufficiency   . Sleep apnea   . Suicide attempt ([place])   . Syncope   . Tobacco abuse      Family History  Problem Relation Age of Onset  . Heart disease Father   . Cancer Mother     breast     Social History   Social History  . Marital status: Single    Spouse name: N/A  . Number of children: N/A  . Years of education: N/A   Social History Main Topics  . Smoking status: Former Smoker    Packs/day: 0.50    Types: Cigarettes    Quit date: 11/12/2015  . Smokeless tobacco: Never Used     Comment: slowing down  . Alcohol use No  . Drug use: No  . Sexual activity: Not Currently   Other Topics Concern  . None   Social History Narrative  . None     ROS: He denies  hemoptysis. He's had no chest pain. He has had some swelling of his extremities. No fever or chills. He is coughing mostly nonproductively.    Objective: Vital signs in last 24 hours: Temp:  [98 F (36.7 C)-99 F (37.2 C)] 98 F (36.7 C) (08/01 0400) Pulse Rate:  [54-96] 71 (08/01 0500) Resp:  [16-28] 21 (08/01 0500) BP: (97-146)/(51-83) 146/83 (08/01 0400) SpO2:  [86 %-96 %] 92 % (08/01 0746) FiO2 (%):  [40 %] 40 % (08/01 0303) Weight:  [168.3 kg (371 lb 0.6 oz)-171.9 kg (379 lb)] 168.3 kg (371 lb 0.6 oz) (08/01 0500) Weight change:  Last BM Date: 12/13/15  Intake/Output from previous day: 07/31 0701 - 08/01 0700 In: 240  Out: -   PHYSICAL EXAM He is coughing during the examination. He is obese. His pupils are reactive. Nose and throat are clear. His neck is supple. He does not have any JVD but he sitting upright. His chest shows rhonchi and wheezing bilaterally. His heart is regular but heart sounds are somewhat diminished by airway noise. His abdomen is soft protuberant without masses. Central nervous system examination is grossly intact. He does not have any clubbing of the digits.  Lab Results: Basic Metabolic Panel:  Recent Labs  12/13/15 0853 12/13/15 1755 12/14/15 0430  NA  137  --  139  K 3.8  --  3.7  CL 92*  --  93*  CO2 35*  --  38*  GLUCOSE 184*  --  234*  BUN 39*  --  45*  CREATININE 1.75* 1.98* 1.60*  CALCIUM 8.0*  --  8.4*   Liver Function Tests:  Recent Labs  12/13/15 0853  AST 40  ALT 54  ALKPHOS 111  BILITOT 2.4*  PROT 7.7  ALBUMIN 3.3*   No results for input(s): LIPASE, AMYLASE in the last 72 hours.  Recent Labs  12/13/15 1755  AMMONIA 37*   CBC:  Recent Labs  12/13/15 0853 12/13/15 1755  WBC 11.9* 7.3  NEUTROABS 9.9*  --   HGB 12.6* 12.1*  HCT 39.2 37.2*  MCV 106.2* 105.7*  PLT 106* 106*   Cardiac Enzymes:  Recent Labs  12/13/15 0901  TROPONINI <0.03   BNP: No results for input(s): PROBNP in the last 72  hours. D-Dimer: No results for input(s): DDIMER in the last 72 hours. CBG:  Recent Labs  12/13/15 1548 12/13/15 1947 12/13/15 2348 12/14/15 0407  GLUCAP 331* 350* 258* 244*   Hemoglobin A1C: No results for input(s): HGBA1C in the last 72 hours. Fasting Lipid Panel: No results for input(s): CHOL, HDL, LDLCALC, TRIG, CHOLHDL, LDLDIRECT in the last 72 hours. Thyroid Function Tests: No results for input(s): TSH, T4TOTAL, FREET4, T3FREE, THYROIDAB in the last 72 hours. Anemia Panel: No results for input(s): VITAMINB12, FOLATE, FERRITIN, TIBC, IRON, RETICCTPCT in the last 72 hours. Coagulation: No results for input(s): LABPROT, INR in the last 72 hours. Urine Drug Screen: Drugs of Abuse     Component Value Date/Time   LABOPIA NEGATIVE 03/25/2013 1630   COCAINSCRNUR NEGATIVE 03/25/2013 1630   LABBENZ NEGATIVE 03/25/2013 1630   AMPHETMU NEGATIVE 03/25/2013 1630   THCU NEGATIVE 03/25/2013 1630   LABBARB NEGATIVE 03/25/2013 1630    Alcohol Level: No results for input(s): ETH in the last 72 hours. Urinalysis: No results for input(s): COLORURINE, LABSPEC, PHURINE, GLUCOSEU, HGBUR, BILIRUBINUR, KETONESUR, PROTEINUR, UROBILINOGEN, NITRITE, LEUKOCYTESUR in the last 72 hours.  Invalid input(s): APPERANCEUR Misc. Labs:   ABGS:  Recent Labs  12/13/15 1810  PHART 7.407  PO2ART 118*  HCO3 32.5*     MICROBIOLOGY: Recent Results (from the past 240 hour(s))  MRSA PCR Screening     Status: None   Collection Time: 12/13/15  3:00 PM  Result Value Ref Range Status   MRSA by PCR NEGATIVE NEGATIVE Final    Comment:        The GeneXpert MRSA Assay (FDA approved for NASAL specimens only), is one component of a comprehensive MRSA colonization surveillance program. It is not intended to diagnose MRSA infection nor to guide or monitor treatment for MRSA infections.     Studies/Results: Dg Chest 1 View  Result Date: 12/13/2015 CLINICAL DATA:  Shortness of breath and cough for  2 weeks EXAM: CHEST 1 VIEW COMPARISON:  December 21, 2014 FINDINGS: There is slight generalized interstitial pulmonary edema. No airspace consolidation. There is cardiomegaly with pulmonary venous hypertension. No adenopathy. No bone lesions. IMPRESSION: Pulmonary vascular congestion with mild interstitial edema. Suspect a degree of congestive heart failure. No airspace consolidation. Electronically Signed   By: Lowella Grip III M.D.   On: 12/13/2015 09:11   Medications:  Prior to Admission:  Prescriptions Prior to Admission  Medication Sig Dispense Refill Last Dose  . ARIPiprazole (ABILIFY) 20 MG tablet Take 20 mg by mouth daily.  12/12/2015 at Unknown time  . aspirin 81 MG tablet Take 81 mg by mouth daily.   12/12/2015 at Unknown time  . atenolol (TENORMIN) 25 MG tablet Take 25 mg by mouth daily.    12/12/2015 at Unknown time  . calcium citrate (CALCITRATE - DOSED IN MG ELEMENTAL CALCIUM) 950 MG tablet Take 1 tablet by mouth daily.   12/12/2015 at Unknown time  . cholecalciferol (VITAMIN D) 1000 UNITS tablet Take 1,000 Units by mouth daily. Reported on 05/03/2015   12/12/2015 at 1000  . citalopram (CELEXA) 40 MG tablet Take 40 mg by mouth at bedtime.    12/12/2015 at 2200  . Fe Bisgly-Succ-C-Thre-B12-FA (IRON-150 PO) Take 1 tablet by mouth daily.   12/12/2015 at 1000  . fluticasone furoate-vilanterol (BREO ELLIPTA) 100-25 MCG/INH AEPB Inhale 1 puff into the lungs daily.   12/12/2015 at Unknown time  . gabapentin (NEURONTIN) 300 MG capsule Take 300 mg by mouth 3 (three) times daily.   12/12/2015 at Unknown time  . guaiFENesin (MUCINEX) 600 MG 12 hr tablet Take 600 mg by mouth 2 (two) times daily.    12/12/2015 at Unknown time  . insulin aspart (NOVOLOG FLEXPEN) 100 UNIT/ML FlexPen Inject 2-10 Units into the skin 4 (four) times daily -  before meals and at bedtime. 150-200=2units Call np/md if less than 70 201-250=4units 251-300=6units 301-350=8units 351-400=10units Greater than 400 call np and md    12/13/2015 at Unknown time  . Insulin Glargine (LANTUS SOLOSTAR) 100 UNIT/ML Solostar Pen Inject 20 Units into the skin at bedtime.   12/12/2015 at 2200  . ipratropium-albuterol (DUONEB) 0.5-2.5 (3) MG/3ML SOLN Take 3 mLs by nebulization 3 (three) times daily. 360 mL 3 12/12/2015 at Unknown time  . lactulose, encephalopathy, (CHRONULAC) 10 GM/15ML SOLN Take 14.5 g by mouth 3 (three) times daily. 34mls 3 times daily   12/12/2015 at Unknown time  . Melatonin 5 MG CAPS Take 1 capsule by mouth at bedtime.   12/12/2015 at 2200  . metFORMIN (GLUCOPHAGE) 500 MG tablet Take 1 tablet (500 mg total) by mouth 2 (two) times daily with a meal. 60 tablet 0 12/12/2015 at Unknown time  . OXYGEN Inhale 3 [name] into the lungs daily as needed (for breathing).    12/12/2015 at 2200  . rifaximin (XIFAXAN) 200 MG tablet Take 200 mg by mouth daily.   12/12/2015 at 1000  . torsemide (DEMADEX) 20 MG tablet Take 40 mg by mouth 2 (two) times daily.    12/12/2015 at Unknown time  . traZODone (DESYREL) 50 MG tablet Take 150 mg by mouth at bedtime.   12/12/2015 at 2100  . HYDROcodone-acetaminophen (NORCO/VICODIN) 5-325 MG tablet Take 1 tablet by mouth every 6 (six) hours.    unknown  . polyethylene glycol (MIRALAX / GLYCOLAX) packet Take 17 g by mouth daily as needed for mild constipation or moderate constipation.    unknown  . PROAIR HFA 108 (90 BASE) MCG/ACT inhaler Inhale 2 puffs into the lungs every 6 (six) hours as needed for wheezing or shortness of breath. Shortness of breath/wheeze   unknown  . sennosides-docusate sodium (SENOKOT-S) 8.6-50 MG tablet Take 1 tablet by mouth daily as needed for constipation.    unknown  . [DISCONTINUED] doxycycline (VIBRA-TABS) 100 MG tablet Take 1 tablet (100 mg total) by mouth 2 (two) times daily. (Patient not taking: Reported on 12/13/2015) 60 tablet 1 Not Taking at Unknown time   Scheduled: . antiseptic oral rinse  7 mL Mouth Rinse q12n4p  . ARIPiprazole  20 mg Oral Daily  . aspirin  81 mg Oral  Daily  . atenolol  25 mg Oral Daily  . azithromycin  250 mg Oral Daily  . chlorhexidine  15 mL Mouth Rinse BID  . citalopram  40 mg Oral QHS  . fluticasone furoate-vilanterol  1 puff Inhalation Daily  . furosemide  60 mg Intravenous BID  . gabapentin  300 mg Oral TID  . guaiFENesin  1,200 mg Oral BID  . heparin  5,000 Units Subcutaneous Q8H  . insulin aspart  0-20 Units Subcutaneous Q4H  . insulin glargine  20 Units Subcutaneous QHS  . ipratropium-albuterol  3 mL Nebulization Q6H  . lactulose  14.5 g Oral TID  . methylPREDNISolone (SOLU-MEDROL) injection  60 mg Intravenous Q6H  . rifaximin  200 mg Oral Daily  . sodium chloride flush  3 mL Intravenous Q12H  . traZODone  150 mg Oral QHS   Continuous:  [id number] chloride, acetaminophen, albuterol, ondansetron (ZOFRAN) IV, sodium chloride flush  Assesment: He has acute on chronic hypoxic/hypercapnic respiratory failure. He has been on BiPAP and is now back on nasal cannula. He is coughing extensively. Chest x-ray consistent with heart failure. His COPD is severe. All of this is complicated by cirrhosis of the liver. Active Problems:   COPD exacerbation (HCC)   Cirrhosis (HCC)   CKD (chronic kidney disease) stage 3, GFR 30-59 ml/min   Essential hypertension   GERD without esophagitis   Thrombocytopenia (HCC)   Acute on chronic respiratory failure with hypoxia (HCC)   DNR (do not resuscitate)   Diabetes mellitus ([place])   CHF (congestive heart failure) ([place])   Respiratory failure ([place])    Plan: Continue treatments. Add flutter valve. He is otherwise on appropriate medications etc. He may need somewhat more broad-spectrum antibiotics if he starts coughing up a lot of sputum is discolored considering the fact that he came from a facility.    LOS: 1 day   Jorge [name] 12/14/2015, 8:04 AM

## 2015-12-14 NOTE — NC FL2 (Signed)
[place] LEVEL OF CARE SCREENING TOOL     IDENTIFICATION  Patient Name: Jorge [name] Birthdate: 06/24/1965 Sex: male Admission Date (Current Location): 12/13/2015  [place] and [place] Number:  [place] [id number] [place] and Address:  [facility] [address] [place]      Provider Number: [id number]  Attending Physician Name and Address:  Kathie Dike, MD  Relative Name and Phone Number:       Current Level of Care: Hospital Recommended Level of Care: [place] Prior Approval Number:    Date Approved/Denied:   PASRR Number: [id number]  Discharge Plan: SNF    Current Diagnoses: Patient Active Problem List   Diagnosis Date Noted  . DNR (do not resuscitate) 12/13/2015  . Diabetes mellitus ([place]) 12/13/2015  . CHF (congestive heart failure) ([place]) 12/13/2015  . Respiratory failure ([place]) 12/13/2015  . Pressure ulcer 12/23/2014  . Syncope 12/23/2014  . Anemia 12/21/2014  . Thrombocytopenia ([place]) 10/27/2014  . Hyperglycemia 10/27/2014  . COPD with exacerbation ([place]) 10/27/2014  . Acute on chronic respiratory failure with hypoxia ([place]) 10/27/2014  . Sleep apnea   . Tobacco abuse   . Insomnia 10/03/2014  . Chronic pain 10/03/2014  . GERD without esophagitis 10/03/2014  . Chronic obstructive pulmonary disease with acute exacerbation ([place])   . Renal insufficiency   . Pain in the chest   . Essential hypertension   . COPD exacerbation ([place]) 08/29/2014  . Chest pain 08/29/2014  . CKD (chronic kidney disease) stage 3, GFR 30-59 ml/min 08/29/2014  . Acute respiratory failure with hypoxia ([place]) 08/29/2014  . Cirrhosis ([place])   . Hypertension     Orientation RESPIRATION BLADDER Height & Weight     Self, Time, Situation, Place  O2 (5 L) Continent Weight: (!) 371 lb 0.6 oz (168.3 kg) Height:  6\' 3"  (190.5 cm)  BEHAVIORAL SYMPTOMS/MOOD NEUROLOGICAL BOWEL NUTRITION STATUS  Other (Comment) (n/a)  (n/a) Incontinent  Diet (heart healthy/carb modified)  AMBULATORY STATUS COMMUNICATION OF NEEDS Skin   Extensive Assist Verbally Normal                       Personal Care Assistance Level of Assistance  Bathing, Feeding, Dressing Bathing Assistance: Maximum assistance Feeding assistance: Limited assistance Dressing Assistance: Maximum assistance     Functional Limitations Info  Sight, Hearing, Speech Sight Info: Adequate Hearing Info: Adequate Speech Info: Adequate    SPECIAL CARE FACTORS FREQUENCY                       Contractures      Additional Factors Info  Psychotropic, Insulin Sliding Scale Code Status Info: DNR Allergies Info: Penicillins Psychotropic Info: Trazodone   Isolation Precautions Info: 8.9.16 MRSA PCR +.     Current Medications (12/14/2015):  This is the current hospital active medication list Current Facility-Administered Medications  Medication Dose Route Frequency Provider Last Rate Last Dose  . 0.9 %  sodium chloride infusion  250 mL Intravenous PRN Kathie Dike, MD      . acetaminophen (TYLENOL) tablet 650 mg  650 mg Oral Q4H PRN Kathie Dike, MD      . albuterol (PROVENTIL) (2.5 MG/3ML) 0.083% nebulizer solution 2.5 mg  2.5 mg Nebulization Q2H PRN Kathie Dike, MD      . antiseptic oral rinse (CPC / CETYLPYRIDINIUM CHLORIDE 0.05%) solution 7 mL  7 mL Mouth Rinse q12n4p Rosita Fire, MD   7 mL at 12/13/15 1600  .  ARIPiprazole (ABILIFY) tablet 20 mg  20 mg Oral Daily Kathie Dike, MD   20 mg at 12/14/15 1005  . aspirin chewable tablet 81 mg  81 mg Oral Daily Kathie Dike, MD   81 mg at 12/14/15 1006  . atenolol (TENORMIN) tablet 25 mg  25 mg Oral Daily Kathie Dike, MD   25 mg at 12/14/15 1005  . azithromycin (ZITHROMAX) tablet 250 mg  250 mg Oral Daily Kathie Dike, MD   250 mg at 12/14/15 1006  . chlorhexidine (PERIDEX) 0.12 % solution 15 mL  15 mL Mouth Rinse BID Rosita Fire, MD   15 mL at 12/14/15 1005  . citalopram (CELEXA) tablet 40  mg  40 mg Oral QHS Kathie Dike, MD   40 mg at 12/13/15 2140  . fluticasone furoate-vilanterol (BREO ELLIPTA) 100-25 MCG/INH 1 puff  1 puff Inhalation Daily Kathie Dike, MD   1 puff at 12/14/15 1049  . furosemide (LASIX) injection 60 mg  60 mg Intravenous BID Kathie Dike, MD   60 mg at 12/14/15 0846  . gabapentin (NEURONTIN) capsule 300 mg  300 mg Oral TID Kathie Dike, MD   300 mg at 12/14/15 1005  . guaiFENesin (MUCINEX) 12 hr tablet 1,200 mg  1,200 mg Oral BID Kathie Dike, MD   1,200 mg at 12/14/15 1005  . heparin injection 5,000 Units  5,000 Units Subcutaneous Q8H Kathie Dike, MD   5,000 Units at 12/14/15 0500  . insulin aspart (novoLOG) injection 0-20 Units  0-20 Units Subcutaneous Q4H Kathie Dike, MD   7 Units at 12/14/15 0844  . insulin glargine (LANTUS) injection 20 Units  20 Units Subcutaneous QHS Kathie Dike, MD   20 Units at 12/13/15 2156  . ipratropium-albuterol (DUONEB) 0.5-2.5 (3) MG/3ML nebulizer solution 3 mL  3 mL Nebulization Q6H Kathie Dike, MD   3 mL at 12/14/15 0746  . lactulose (CHRONULAC) 10 GM/15ML solution 14.5 g  14.5 g Oral TID Kathie Dike, MD   14.5 g at 12/14/15 1007  . methylPREDNISolone sodium succinate (SOLU-MEDROL) 125 mg/2 mL injection 60 mg  60 mg Intravenous Q6H Kathie Dike, MD   60 mg at 12/14/15 0500  . ondansetron (ZOFRAN) injection 4 mg  4 mg Intravenous Q6H PRN Kathie Dike, MD      . rifaximin Doreene Nest) tablet 200 mg  200 mg Oral Daily Kathie Dike, MD   200 mg at 12/14/15 1005  . sodium chloride flush (NS) 0.9 % injection 3 mL  3 mL Intravenous Q12H Kathie Dike, MD   3 mL at 12/14/15 1000  . sodium chloride flush (NS) 0.9 % injection 3 mL  3 mL Intravenous PRN Kathie Dike, MD      . traZODone (DESYREL) tablet 150 mg  150 mg Oral QHS Kathie Dike, MD   150 mg at 12/13/15 2140     Discharge Medications: Please see discharge summary for a list of discharge medications.  Relevant Imaging Results:  Relevant Lab  Results:   Additional Information    Salome Arnt, [place]

## 2015-12-15 LAB — GLUCOSE, CAPILLARY
GLUCOSE-CAPILLARY: 245 mg/dL — AB (ref 65–99)
GLUCOSE-CAPILLARY: 366 mg/dL — AB (ref 65–99)
GLUCOSE-CAPILLARY: 447 mg/dL — AB (ref 65–99)
Glucose-Capillary: 275 mg/dL — ABNORMAL HIGH (ref 65–99)
Glucose-Capillary: 327 mg/dL — ABNORMAL HIGH (ref 65–99)
Glucose-Capillary: 358 mg/dL — ABNORMAL HIGH (ref 65–99)

## 2015-12-15 LAB — BASIC METABOLIC PANEL
ANION GAP: 9 (ref 5–15)
BUN: 50 mg/dL — AB (ref 6–20)
CHLORIDE: 93 mmol/L — AB (ref 101–111)
CO2: 37 mmol/L — ABNORMAL HIGH (ref 22–32)
Calcium: 8.6 mg/dL — ABNORMAL LOW (ref 8.9–10.3)
Creatinine, Ser: 1.47 mg/dL — ABNORMAL HIGH (ref 0.61–1.24)
GFR, EST NON AFRICAN AMERICAN: 54 mL/min — AB (ref >60)
Glucose, Bld: 259 mg/dL — ABNORMAL HIGH (ref 65–99)
POTASSIUM: 4 mmol/L (ref 3.5–5.1)
SODIUM: 139 mmol/L (ref 135–145)

## 2015-12-15 LAB — CBC
HEMATOCRIT: 40.1 % (ref 39.0–52.0)
HEMOGLOBIN: 12.7 g/dL — AB (ref 13.0–17.0)
MCH: 33.5 pg (ref 26.0–34.0)
MCHC: 31.7 g/dL (ref 30.0–36.0)
MCV: 105.8 fL — AB (ref 78.0–100.0)
Platelets: 140 10*3/uL — ABNORMAL LOW (ref 150–400)
RBC: 3.79 MIL/uL — AB (ref 4.22–5.81)
RDW: 14.5 % (ref 11.5–15.5)
WBC: 9.2 10*3/uL (ref 4.0–10.5)

## 2015-12-15 MED ORDER — INSULIN GLARGINE 100 UNIT/ML ~~LOC~~ SOLN
25.0000 [IU] | Freq: Every day | SUBCUTANEOUS | Status: DC
  Administered 2015-12-15: 25 [IU] via SUBCUTANEOUS
  Filled 2015-12-15 (×2): qty 0.25

## 2015-12-15 NOTE — Care Management Important Message (Signed)
Important Message  Patient Details  Name: Jorge [name] MRN: [id number] Date of Birth: 11/19/1965   Medicare Important Message Given:  Yes    Sherald Barge, RN 12/15/2015, 3:19 PM

## 2015-12-15 NOTE — Progress Notes (Signed)
PROGRESS NOTE  Jorge [name] [id number] DOB: Feb 28, 1966 DOA: 12/13/2015 PCP: Hilbert Corrigan, MD  Brief Narrative: 50 year old man presented to the emergency department from [facility] with complaints of SOB for 1-2 weeks, felt to be related to COPD with an element of CHF. During ED evaluation, he received nebulizers, lasix and steroids and initially showed improvement, but subsequently became somnolent. ABG was performed and showed CO2 retention. He was started on BiPAP and referred for further management. Pulmonology has been consulted.  Assessment/Plan: 1. Acute on chronic respiratory failure with acute/chronic hypoxia and hypercapnea with acute respiratory acidosis. Slowly improving. Secondary COPD. Blood gas and mental status have improved with BiPAP therapy. Pulmonology input appreciated. 2. COPD exacerbation. Slow to improve. Decreased breath sounds bilaterally. 3. Acute CHF was considered on admission, however chest x-ray findings are chronic compared to previous study August 2016, echocardiogram showed normal left ventricular systolic and diastolic function and normal right ventricular function. BNP was only minimally elevated. EKG on admission sinus rhythm, anteroseptal MI, old. No acute changes. Acute CHF ruled out. 4. Cirrhosis, alcoholic, per chart. Ammonia level has not been elevated. Appears stable. 5. DM type 2. Oral agents being held. On SSI and basal insulin. CBG high on steroids. 6. 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 Cr near baseline.  7. Thrombocytopenia. Likely related to cirrhosis. No evidence of bleeding currently. 8. HTN. Stable. 9. OSA. BiPAP at night.   Slowly improving. Will continue management directed at COPD.  D/C Lasix.  Continue BiPAP at night. Required BiPAP during the day yesterday. Keep the stepdown unit today.  Adjust insulin. Will increase Lantus to 25 units at night.  DVT prophylaxis:heparin Code Status:DNR Family Communication:no  family present at bedside. Disposition Plan:discharge back to [facility] when improved. Suggest establishing HCPOA.  Murray Hodgkins, MD  [facility] Direct contact: [phone] --Via amion app OR  --[email or web address]; password [email or web address]  7PM-7AM contact night coverage as above 12/15/2015, 7:56 AM  LOS: 2 days   Consultants:  Pulmonology  Procedures:  BiPAP  ECHO Study Conclusions  - Left ventricle: The cavity size was at the upper limits of   normal. Wall thickness was increased in a pattern of mild LVH.   Systolic function was normal. The estimated ejection fraction was   in the range of 60% to 65%. Left ventricular diastolic function   parameters were normal. - Aortic valve: Poorly visualized. Mildly calcified annulus. Mean   gradient (S): 7 mm Hg. Valve area (VTI): 1.84 cm^2. Valve area   (Vmax): 1.91 cm^2. - Mitral valve: Calcified annulus. There was trivial regurgitation. - Tricuspid valve: There was trivial regurgitation. - Pulmonary arteries: Systolic pressure could not be accurately   estimated. - Pericardium, extracardiac: There was no pericardial effusion.  Antimicrobials:  Zithromax 7/31 >>  HPI/Subjective: Feels slightly improved. Slept last night. Reports appetite, and has been able to eat food. Breathing has only slightly improved. Reports productive cough. Wears 2L nightly rather than CPAP at facility.  Objective: Vitals:   12/15/15 0400 12/15/15 0500 12/15/15 0600 12/15/15 0744  BP: 128/78 126/62 (!) 130/104   Pulse: (!) 59 (!) 53 (!) 52   Resp: 20 17 20    Temp: 97.9 F (36.6 C)   97 F (36.1 C)  TempSrc: Axillary   Axillary  SpO2: 92% 92% 93%   Weight:  (!) 167.3 kg (368 lb 13.3 oz)    Height:        Intake/Output Summary (Last 24 hours) at 12/15/15 0756 Last data filed at  12/15/15 0500  Gross per 24 hour  Intake             1740 ml  Output             2750 ml  Net            -1010 ml     Filed Weights   12/13/15 1540 12/14/15 0500  12/15/15 0500  Weight: (!) 168.5 kg (371 lb 7.6 oz) (!) 168.3 kg (371 lb 0.6 oz) (!) 167.3 kg (368 lb 13.3 oz)    Exam:    Constitutional:  . Appears calm and comfortable Eyes:  . PERRL and irises appear normal . Conjunctivae and lids appear normal ENMT:  . external ears, nose appear normal . grossly normal hearing  . Lips appear normal Neck:  . neck appears normal, no masses, normal ROM, supple  No thyroidmegaly Respiratory:  . No w/r/r. Decreased breath sounds bilaterally . Mild increased respiratory effort. No retractions or accessory muscle use. Speaks in full sentences. Cardiovascular:  . RRR, no m/r/g . Trace to 1+ pedal LE extremity edema   Abdomen:  . Abdomen appears normal; no tenderness or masses, non-distended Musculoskeletal:  o Moves all extremities to command Skin:  . Venous stasis changes Psychiatric:  . judgement and insight appear normal . Mental status o Mood, affect appropriate  I have personally reviewed following labs and imaging studies:  ECHO EF 60-65%  UOP 2750 minus 775mL since admission  BUN 50, Cr 1.47, without significant change  CBC unremarkable  CBG 200-400's  Scheduled Meds: . antiseptic oral rinse  7 mL Mouth Rinse q12n4p  . ARIPiprazole  20 mg Oral Daily  . aspirin  81 mg Oral Daily  . atenolol  25 mg Oral Daily  . azithromycin  250 mg Oral Daily  . chlorhexidine  15 mL Mouth Rinse BID  . citalopram  40 mg Oral QHS  . fluticasone furoate-vilanterol  1 puff Inhalation Daily  . furosemide  60 mg Intravenous BID  . gabapentin  300 mg Oral TID  . guaiFENesin  1,200 mg Oral BID  . heparin  5,000 Units Subcutaneous Q8H  . insulin aspart  0-20 Units Subcutaneous Q4H  . insulin glargine  20 Units Subcutaneous QHS  . ipratropium-albuterol  3 mL Nebulization Q6H  . lactulose  14.5 g Oral TID  . methylPREDNISolone (SOLU-MEDROL) injection  60 mg Intravenous Q6H  . rifaximin  200 mg Oral Daily  . sodium chloride flush  3 mL  Intravenous Q12H  . traZODone  150 mg Oral QHS   Continuous Infusions:   Active Problems:   COPD exacerbation (HCC)   Cirrhosis ([place])   CKD (chronic kidney disease) stage 3, GFR 30-59 ml/min   Essential hypertension   GERD without esophagitis   Thrombocytopenia (HCC)   Acute on chronic respiratory failure with hypoxia (HCC)   DNR (do not resuscitate)   Diabetes mellitus ([place])   CHF (congestive heart failure) ([place])   Respiratory failure ([place])   LOS: 2 days   Time spent 25 minutes  By signing my name below, I, Delene Ruffini, attest that this documentation has been prepared under the direction and in the presence of Elvina Bosch P. Sarajane Jews, MD. Electronically Signed: Delene Ruffini, Scribe.  12/15/15 10:55am    I personally performed the services described in this documentation. All medical record entries made by the scribe were at my direction. I have reviewed the chart and agree that the record reflects my personal performance and  is accurate and complete. Gael Delude, MD    

## 2015-12-15 NOTE — Progress Notes (Signed)
Inpatient Diabetes Program Recommendations  AACE/ADA: New Consensus Statement on Inpatient Glycemic Control (2015)  Target Ranges:  Prepandial:   less than 140 mg/dL      Peak postprandial:   less than 180 mg/dL (1-2 hours)      Critically ill patients:  140 - 180 mg/dL   Lab Results  Component Value Date   GLUCAP 358 (H) 12/15/2015   HGBA1C 5.0 03/26/2013   Results for QUIDO, BRUINGTON (MRN [id number]) as of 12/15/2015 12:11  Ref. Range 12/14/2015 20:19 12/14/2015 23:51 12/15/2015 03:51 12/15/2015 07:44 12/15/2015 11:25  Glucose-Capillary Latest Ref Range: 65 - 99 mg/dL 440 (H) 367 (H) 275 (H) 245 (H) 358 (H)   Review of Glycemic Control CBGs range from 245 - 440 mg/dL within past 12 hours. Needs increase in insulin. Lantus increased to 30 units QHS Continues with Solumedrol 60 mg Q6H. Needs meal coverage insulin.  Inpatient Diabetes Program Recommendations:    Add Novolog 5 units tidwc for meal coverage insulin.  Will continue to follow. Thank you. Lorenda Peck, RD, LDN, CDE Inpatient Diabetes Coordinator [phone]

## 2015-12-15 NOTE — Progress Notes (Signed)
**Note De-identified  Obfuscation** Patient removed from BIPAP and placed on 2 L [place]; RRT to continue to monitor. 

## 2015-12-15 NOTE — Progress Notes (Signed)
Subjective: He's currently on BiPAP/noninvasive ventilator. He says he feels better. He has no new complaints.  Objective: Vital signs in last 24 hours: Temp:  [97 F (36.1 C)-98 F (36.7 C)] 97 F (36.1 C) (08/02 0744) Pulse Rate:  [51-91] 52 (08/02 0600) Resp:  [15-28] 20 (08/02 0600) BP: (120-145)/(30-114) 130/104 (08/02 0600) SpO2:  [88 %-97 %] 93 % (08/02 0600) FiO2 (%):  [40 %] 40 % (08/02 0254) Weight:  [167.3 kg (368 lb 13.3 oz)] 167.3 kg (368 lb 13.3 oz) (08/02 0500) Weight change: -4.613 kg (-10 lb 2.7 oz) Last BM Date: 12/13/15  Intake/Output from previous day: 08/01 0701 - 08/02 0700 In: 1740 [P.O.:1140] Out: 2750 [Urine:2750]  PHYSICAL EXAM General appearance: alert, cooperative and On BiPAP Resp: rhonchi bilaterally Cardio: regular rate and rhythm, S1, S2 normal, no murmur, click, rub or gallop GI: soft, non-tender; bowel sounds normal; no masses,  no organomegaly Extremities: extremities normal, atraumatic, no cyanosis or edema  Lab Results:  Results for orders placed or performed during the hospital encounter of 12/13/15 (from the past 48 hour(s))  Comprehensive metabolic panel     Status: Abnormal   Collection Time: 12/13/15  8:53 AM  Result Value Ref Range   Sodium 137 135 - 145 mmol/L   Potassium 3.8 3.5 - 5.1 mmol/L   Chloride 92 (L) 101 - 111 mmol/L   CO2 35 (H) 22 - 32 mmol/L   Glucose, Bld 184 (H) 65 - 99 mg/dL   BUN 39 (H) 6 - 20 mg/dL   Creatinine, Ser 1.75 (H) 0.61 - 1.24 mg/dL   Calcium 8.0 (L) 8.9 - 10.3 mg/dL   Total Protein 7.7 6.5 - 8.1 g/dL   Albumin 3.3 (L) 3.5 - 5.0 g/dL   AST 40 15 - 41 U/L   ALT 54 17 - 63 U/L   Alkaline Phosphatase 111 38 - 126 U/L   Total Bilirubin 2.4 (H) 0.3 - 1.2 mg/dL   GFR calc non Af Amer 43 (L) >60 mL/min   GFR calc Af Amer 50 (L) >60 mL/min    Comment: (NOTE) The eGFR has been calculated using the CKD EPI equation. This calculation has not been validated in all clinical situations. eGFR's  persistently <60 mL/min signify possible Chronic Kidney Disease.    Anion gap 10 5 - 15  CBC with Differential     Status: Abnormal   Collection Time: 12/13/15  8:53 AM  Result Value Ref Range   WBC 11.9 (H) 4.0 - 10.5 K/uL   RBC 3.69 (L) 4.22 - 5.81 MIL/uL   Hemoglobin 12.6 (L) 13.0 - 17.0 g/dL   HCT 39.2 39.0 - 52.0 %   MCV 106.2 (H) 78.0 - 100.0 fL   MCH 34.1 (H) 26.0 - 34.0 pg   MCHC 32.1 30.0 - 36.0 g/dL   RDW 15.1 11.5 - 15.5 %   Platelets 106 (L) 150 - 400 K/uL    Comment: SPECIMEN CHECKED FOR CLOTS PLATELET COUNT CONFIRMED BY SMEAR    Neutrophils Relative % 83 %   Neutro Abs 9.9 (H) 1.7 - 7.7 K/uL   Lymphocytes Relative 9 %   Lymphs Abs 1.1 0.7 - 4.0 K/uL   Monocytes Relative 7 %   Monocytes Absolute 0.8 0.1 - 1.0 K/uL   Eosinophils Relative 0 %   Eosinophils Absolute 0.1 0.0 - 0.7 K/uL   Basophils Relative 0 %   Basophils Absolute 0.0 0.0 - 0.1 K/uL  Brain natriuretic peptide     Status:  Abnormal   Collection Time: 12/13/15  9:01 AM  Result Value Ref Range   B Natriuretic Peptide 124.0 (H) 0.0 - 100.0 pg/mL  Troponin I     Status: None   Collection Time: 12/13/15  9:01 AM  Result Value Ref Range   Troponin I <0.03 <0.03 ng/mL  Blood gas, arterial ([facility] ONLY)     Status: Abnormal   Collection Time: 12/13/15 10:15 AM  Result Value Ref Range   O2 Content 5.0 L/min   Delivery systems NASAL CANNULA    pH, Arterial 7.334 (L) 7.350 - 7.450   pCO2 arterial 68.6 (HH) 35.0 - 45.0 mmHg    Comment: CRITICAL RESULT CALLED TO, READ BACK BY AND VERIFIED WITH: CARDWELL,L.RN AT 1024 12/13/15 BY BROADNAX,L.RRT    pO2, Arterial 67.9 (L) 80.0 - 100.0 mmHg   Bicarbonate 31.3 (H) 20.0 - 24.0 mEq/L   Acid-Base Excess 9.6 (H) 0.0 - 2.0 mmol/L   O2 Saturation 91.1 %   Collection site LEFT RADIAL    Drawn by [id number]    Sample type ARTERIAL    Allens test (pass/fail) PASS PASS  Blood gas, arterial ([facility] ONLY)     Status: Abnormal   Collection Time: 12/13/15  1:40 PM  Result  Value Ref Range   O2 Content 4.0 L/min   Delivery systems NASAL CANNULA    pH, Arterial 7.353 7.350 - 7.450   pCO2 arterial 65.0 (HH) 35.0 - 45.0 mmHg    Comment: CRITICAL RESULT CALLED TO, READ BACK BY AND VERIFIED WITH: CARDWELL,L.RN 12/13/15 AT 1352 BY BROADNAX,L.RRT    pO2, Arterial 58.5 (L) 80.0 - 100.0 mmHg   Bicarbonate 31.5 (H) 20.0 - 24.0 mEq/L   Acid-Base Excess 9.6 (H) 0.0 - 2.0 mmol/L   O2 Saturation 87.7 %   Collection site LEFT RADIAL    Drawn by [id number]    Sample type ARTERIAL    Allens test (pass/fail) PASS PASS  MRSA PCR Screening     Status: None   Collection Time: 12/13/15  3:00 PM  Result Value Ref Range   MRSA by PCR NEGATIVE NEGATIVE    Comment:        The GeneXpert MRSA Assay (FDA approved for NASAL specimens only), is one component of a comprehensive MRSA colonization surveillance program. It is not intended to diagnose MRSA infection nor to guide or monitor treatment for MRSA infections.   Glucose, capillary     Status: Abnormal   Collection Time: 12/13/15  3:48 PM  Result Value Ref Range   Glucose-Capillary 331 (H) 65 - 99 mg/dL  Ammonia     Status: Abnormal   Collection Time: 12/13/15  5:55 PM  Result Value Ref Range   Ammonia 37 (H) 9 - 35 umol/L  CBC     Status: Abnormal   Collection Time: 12/13/15  5:55 PM  Result Value Ref Range   WBC 7.3 4.0 - 10.5 K/uL   RBC 3.52 (L) 4.22 - 5.81 MIL/uL   Hemoglobin 12.1 (L) 13.0 - 17.0 g/dL   HCT 37.2 (L) 39.0 - 52.0 %   MCV 105.7 (H) 78.0 - 100.0 fL   MCH 34.4 (H) 26.0 - 34.0 pg   MCHC 32.5 30.0 - 36.0 g/dL   RDW 15.0 11.5 - 15.5 %   Platelets 106 (L) 150 - 400 K/uL    Comment: SPECIMEN CHECKED FOR CLOTS CONSISTENT WITH PREVIOUS RESULT   Creatinine, serum     Status: Abnormal   Collection Time:  12/13/15  5:55 PM  Result Value Ref Range   Creatinine, Ser 1.98 (H) 0.61 - 1.24 mg/dL   GFR calc non Af Amer 37 (L) >60 mL/min   GFR calc Af Amer 43 (L) >60 mL/min    Comment: (NOTE) The eGFR has  been calculated using the CKD EPI equation. This calculation has not been validated in all clinical situations. eGFR's persistently <60 mL/min signify possible Chronic Kidney Disease.   Blood gas, arterial     Status: Abnormal   Collection Time: 12/13/15  6:10 PM  Result Value Ref Range   FIO2 0.50    O2 Content 50.0 L/min   Delivery systems VENTILATOR    Mode BILEVEL POSITIVE AIRWAY PRESSURE    Inspiratory PAP 20.0    Expiratory PAP 8.0    pH, Arterial 7.407 7.350 - 7.450   pCO2 arterial 56.4 (H) 35.0 - 45.0 mmHg   pO2, Arterial 118 (H) 80.0 - 100.0 mmHg   Bicarbonate 32.5 (H) 20.0 - 24.0 mEq/L   Acid-Base Excess 9.8 (H) 0.0 - 2.0 mmol/L   O2 Saturation 98.4 %   Collection site RIGHT RADIAL    Drawn by [phone]    Sample type ARTERIAL    Allens test (pass/fail) PASS PASS  Glucose, capillary     Status: Abnormal   Collection Time: 12/13/15  7:47 PM  Result Value Ref Range   Glucose-Capillary 350 (H) 65 - 99 mg/dL   Comment 1 Notify RN   Glucose, capillary     Status: Abnormal   Collection Time: 12/13/15 11:48 PM  Result Value Ref Range   Glucose-Capillary 258 (H) 65 - 99 mg/dL   Comment 1 Notify RN   Glucose, capillary     Status: Abnormal   Collection Time: 12/14/15  4:07 AM  Result Value Ref Range   Glucose-Capillary 244 (H) 65 - 99 mg/dL   Comment 1 Notify RN   Basic metabolic panel     Status: Abnormal   Collection Time: 12/14/15  4:30 AM  Result Value Ref Range   Sodium 139 135 - 145 mmol/L   Potassium 3.7 3.5 - 5.1 mmol/L   Chloride 93 (L) 101 - 111 mmol/L   CO2 38 (H) 22 - 32 mmol/L   Glucose, Bld 234 (H) 65 - 99 mg/dL   BUN 45 (H) 6 - 20 mg/dL   Creatinine, Ser 1.60 (H) 0.61 - 1.24 mg/dL   Calcium 8.4 (L) 8.9 - 10.3 mg/dL   GFR calc non Af Amer 48 (L) >60 mL/min   GFR calc Af Amer 56 (L) >60 mL/min    Comment: (NOTE) The eGFR has been calculated using the CKD EPI equation. This calculation has not been validated in all clinical situations. eGFR's  persistently <60 mL/min signify possible Chronic Kidney Disease.    Anion gap 8 5 - 15  Glucose, capillary     Status: Abnormal   Collection Time: 12/14/15  8:16 AM  Result Value Ref Range   Glucose-Capillary 246 (H) 65 - 99 mg/dL  Glucose, capillary     Status: Abnormal   Collection Time: 12/14/15 11:42 AM  Result Value Ref Range   Glucose-Capillary 377 (H) 65 - 99 mg/dL  Glucose, capillary     Status: Abnormal   Collection Time: 12/14/15  4:09 PM  Result Value Ref Range   Glucose-Capillary 399 (H) 65 - 99 mg/dL  Glucose, capillary     Status: Abnormal   Collection Time: 12/14/15  8:19 PM  Result Value  Ref Range   Glucose-Capillary 440 (H) 65 - 99 mg/dL   Comment 1 Notify RN   Glucose, capillary     Status: Abnormal   Collection Time: 12/14/15 11:51 PM  Result Value Ref Range   Glucose-Capillary 367 (H) 65 - 99 mg/dL   Comment 1 Notify RN   Glucose, capillary     Status: Abnormal   Collection Time: 12/15/15  3:51 AM  Result Value Ref Range   Glucose-Capillary 275 (H) 65 - 99 mg/dL   Comment 1 Notify RN   Basic metabolic panel     Status: Abnormal   Collection Time: 12/15/15  4:31 AM  Result Value Ref Range   Sodium 139 135 - 145 mmol/L   Potassium 4.0 3.5 - 5.1 mmol/L   Chloride 93 (L) 101 - 111 mmol/L   CO2 37 (H) 22 - 32 mmol/L   Glucose, Bld 259 (H) 65 - 99 mg/dL   BUN 50 (H) 6 - 20 mg/dL   Creatinine, Ser 1.47 (H) 0.61 - 1.24 mg/dL   Calcium 8.6 (L) 8.9 - 10.3 mg/dL   GFR calc non Af Amer 54 (L) >60 mL/min   GFR calc Af Amer >60 >60 mL/min    Comment: (NOTE) The eGFR has been calculated using the CKD EPI equation. This calculation has not been validated in all clinical situations. eGFR's persistently <60 mL/min signify possible Chronic Kidney Disease.    Anion gap 9 5 - 15  CBC     Status: Abnormal   Collection Time: 12/15/15  4:31 AM  Result Value Ref Range   WBC 9.2 4.0 - 10.5 K/uL   RBC 3.79 (L) 4.22 - 5.81 MIL/uL   Hemoglobin 12.7 (L) 13.0 - 17.0 g/dL    HCT 40.1 39.0 - 52.0 %   MCV 105.8 (H) 78.0 - 100.0 fL   MCH 33.5 26.0 - 34.0 pg   MCHC 31.7 30.0 - 36.0 g/dL   RDW 14.5 11.5 - 15.5 %   Platelets 140 (L) 150 - 400 K/uL  Glucose, capillary     Status: Abnormal   Collection Time: 12/15/15  7:44 AM  Result Value Ref Range   Glucose-Capillary 245 (H) 65 - 99 mg/dL   Comment 1 Notify RN    Comment 2 Document in Chart     ABGS  Recent Labs  12/13/15 1810  PHART 7.407  PO2ART 118*  HCO3 32.5*   CULTURES Recent Results (from the past 240 hour(s))  MRSA PCR Screening     Status: None   Collection Time: 12/13/15  3:00 PM  Result Value Ref Range Status   MRSA by PCR NEGATIVE NEGATIVE Final    Comment:        The GeneXpert MRSA Assay (FDA approved for NASAL specimens only), is one component of a comprehensive MRSA colonization surveillance program. It is not intended to diagnose MRSA infection nor to guide or monitor treatment for MRSA infections.    Studies/Results: Dg Chest 1 View  Result Date: 12/13/2015 CLINICAL DATA:  Shortness of breath and cough for 2 weeks EXAM: CHEST 1 VIEW COMPARISON:  December 21, 2014 FINDINGS: There is slight generalized interstitial pulmonary edema. No airspace consolidation. There is cardiomegaly with pulmonary venous hypertension. No adenopathy. No bone lesions. IMPRESSION: Pulmonary vascular congestion with mild interstitial edema. Suspect a degree of congestive heart failure. No airspace consolidation. Electronically Signed   By: Lowella Grip III M.D.   On: 12/13/2015 09:11   Medications:  Prior to Admission:  Prescriptions Prior to Admission  Medication Sig Dispense Refill Last Dose  . ARIPiprazole (ABILIFY) 20 MG tablet Take 20 mg by mouth daily.   12/12/2015 at Unknown time  . aspirin 81 MG tablet Take 81 mg by mouth daily.   12/12/2015 at Unknown time  . atenolol (TENORMIN) 25 MG tablet Take 25 mg by mouth daily.    12/12/2015 at Unknown time  . calcium citrate (CALCITRATE - DOSED  IN MG ELEMENTAL CALCIUM) 950 MG tablet Take 1 tablet by mouth daily.   12/12/2015 at Unknown time  . cholecalciferol (VITAMIN D) 1000 UNITS tablet Take 1,000 Units by mouth daily. Reported on 05/03/2015   12/12/2015 at 1000  . citalopram (CELEXA) 40 MG tablet Take 40 mg by mouth at bedtime.    12/12/2015 at 2200  . Fe Bisgly-Succ-C-Thre-B12-FA (IRON-150 PO) Take 1 tablet by mouth daily.   12/12/2015 at 1000  . fluticasone furoate-vilanterol (BREO ELLIPTA) 100-25 MCG/INH AEPB Inhale 1 puff into the lungs daily.   12/12/2015 at Unknown time  . gabapentin (NEURONTIN) 300 MG capsule Take 300 mg by mouth 3 (three) times daily.   12/12/2015 at Unknown time  . guaiFENesin (MUCINEX) 600 MG 12 hr tablet Take 600 mg by mouth 2 (two) times daily.    12/12/2015 at Unknown time  . insulin aspart (NOVOLOG FLEXPEN) 100 UNIT/ML FlexPen Inject 2-10 Units into the skin 4 (four) times daily -  before meals and at bedtime. 150-200=2units Call np/md if less than 70 201-250=4units 251-300=6units 301-350=8units 351-400=10units Greater than 400 call np and md   12/13/2015 at Unknown time  . Insulin Glargine (LANTUS SOLOSTAR) 100 UNIT/ML Solostar Pen Inject 20 Units into the skin at bedtime.   12/12/2015 at 2200  . ipratropium-albuterol (DUONEB) 0.5-2.5 (3) MG/3ML SOLN Take 3 mLs by nebulization 3 (three) times daily. 360 mL 3 12/12/2015 at Unknown time  . lactulose, encephalopathy, (CHRONULAC) 10 GM/15ML SOLN Take 14.5 g by mouth 3 (three) times daily. 3 times daily   12/12/2015 at Unknown time  . Melatonin 5 MG CAPS Take 1 capsule by mouth at bedtime.   12/12/2015 at 2200  . metFORMIN (GLUCOPHAGE) 500 MG tablet Take 1 tablet (500 mg total) by mouth 2 (two) times daily with a meal. 60 tablet 0 12/12/2015 at Unknown time  . OXYGEN Inhale 3 L into the lungs daily as needed (for breathing).    12/12/2015 at 2200  . rifaximin (XIFAXAN) 200 MG tablet Take 200 mg by mouth daily.   12/12/2015 at 1000  . torsemide (DEMADEX) 20 MG  tablet Take 40 mg by mouth 2 (two) times daily.    12/12/2015 at Unknown time  . traZODone (DESYREL) 50 MG tablet Take 150 mg by mouth at bedtime.   12/12/2015 at 2100  . HYDROcodone-acetaminophen (NORCO/VICODIN) 5-325 MG tablet Take 1 tablet by mouth every 6 (six) hours.    unknown  . polyethylene glycol (MIRALAX / GLYCOLAX) packet Take 17 g by mouth daily as needed for mild constipation or moderate constipation.    unknown  . PROAIR HFA 108 (90 BASE) MCG/ACT inhaler Inhale 2 puffs into the lungs every 6 (six) hours as needed for wheezing or shortness of breath. Shortness of breath/wheeze   unknown  . sennosides-docusate sodium (SENOKOT-S) 8.6-50 MG tablet Take 1 tablet by mouth daily as needed for constipation.    unknown  . [DISCONTINUED] doxycycline (VIBRA-TABS) 100 MG tablet Take 1 tablet (100 mg total) by mouth 2 (two) times daily. (Patient not taking: Reported  on 12/13/2015) 60 tablet 1 Not Taking at Unknown time   Scheduled: . antiseptic oral rinse  7 mL Mouth Rinse q12n4p  . ARIPiprazole  20 mg Oral Daily  . aspirin  81 mg Oral Daily  . atenolol  25 mg Oral Daily  . azithromycin  250 mg Oral Daily  . chlorhexidine  15 mL Mouth Rinse BID  . citalopram  40 mg Oral QHS  . fluticasone furoate-vilanterol  1 puff Inhalation Daily  . furosemide  60 mg Intravenous BID  . gabapentin  300 mg Oral TID  . guaiFENesin  1,200 mg Oral BID  . heparin  5,000 Units Subcutaneous Q8H  . insulin aspart  0-20 Units Subcutaneous Q4H  . insulin glargine  20 Units Subcutaneous QHS  . ipratropium-albuterol  3 mL Nebulization Q6H  . lactulose  14.5 g Oral TID  . methylPREDNISolone (SOLU-MEDROL) injection  60 mg Intravenous Q6H  . rifaximin  200 mg Oral Daily  . sodium chloride flush  3 mL Intravenous Q12H  . traZODone  150 mg Oral QHS   Continuous:  YBO:FBPZWC chloride, acetaminophen, albuterol, ondansetron (ZOFRAN) IV, sodium chloride flush  Assesment: He has multiple medical problems with acute on  chronic hypoxic and hypercapnic respiratory failure. He is requiring bilevel support. I believe that his skilled care facility he is on CPAP. There was some concern about congestive heart failure but his echocardiogram shows essentially normal cardiac function. He seems to be generally improving with treatment.  He has some element of obesity hypoventilation. He has sleep apnea and as I say I think he is on CPAP at his facility but we may need to upgrade that.  He has diabetes which is being treated with insulin  He has cirrhosis of the liver and thrombocytopenia probably from that  He has hypertension which is pretty well controlled  He has chronic kidney disease and that seems about the same Active Problems:   COPD exacerbation (HCC)   Cirrhosis ([place])   CKD (chronic kidney disease) stage 3, GFR 30-59 ml/min   Essential hypertension   GERD without esophagitis   Thrombocytopenia (HCC)   Acute on chronic respiratory failure with hypoxia ([place])   DNR (do not resuscitate)   Diabetes mellitus ([place])   CHF (congestive heart failure) ([address])   Respiratory failure ([place])    Plan: Continue current treatments. No changes in medications at this point.    LOS: 2 days   Jorge [name] L 12/15/2015, 7:50 AM

## 2015-12-16 LAB — BASIC METABOLIC PANEL
Anion gap: 5 (ref 5–15)
BUN: 58 mg/dL — AB (ref 6–20)
CO2: 36 mmol/L — ABNORMAL HIGH (ref 22–32)
Calcium: 8.1 mg/dL — ABNORMAL LOW (ref 8.9–10.3)
Chloride: 93 mmol/L — ABNORMAL LOW (ref 101–111)
Creatinine, Ser: 1.54 mg/dL — ABNORMAL HIGH (ref 0.61–1.24)
GFR, EST AFRICAN AMERICAN: 59 mL/min — AB (ref >60)
GFR, EST NON AFRICAN AMERICAN: 51 mL/min — AB (ref >60)
Glucose, Bld: 325 mg/dL — ABNORMAL HIGH (ref 65–99)
POTASSIUM: 4.2 mmol/L (ref 3.5–5.1)
SODIUM: 134 mmol/L — AB (ref 135–145)

## 2015-12-16 LAB — GLUCOSE, CAPILLARY
GLUCOSE-CAPILLARY: 338 mg/dL — AB (ref 65–99)
GLUCOSE-CAPILLARY: 318 mg/dL — AB (ref 65–99)
GLUCOSE-CAPILLARY: 435 mg/dL — AB (ref 65–99)
Glucose-Capillary: 370 mg/dL — ABNORMAL HIGH (ref 65–99)
Glucose-Capillary: 357 mg/dL — ABNORMAL HIGH (ref 65–99)

## 2015-12-16 MED ORDER — INSULIN GLARGINE 100 UNIT/ML ~~LOC~~ SOLN
33.0000 [IU] | Freq: Every day | SUBCUTANEOUS | Status: DC
  Administered 2015-12-16: 33 [IU] via SUBCUTANEOUS
  Filled 2015-12-16: qty 0.33

## 2015-12-16 MED ORDER — CALCIUM CARBONATE ANTACID 500 MG PO CHEW: 1.0000 | CHEWABLE_TABLET | Freq: Four times a day (QID) | ORAL | Status: DC | PRN

## 2015-12-16 MED ORDER — INSULIN ASPART 100 UNIT/ML ~~LOC~~ SOLN
5.0000 [IU] | Freq: Three times a day (TID) | SUBCUTANEOUS | Status: DC
Start: 2015-12-16 — End: 2015-12-17
  Administered 2015-12-16 – 2015-12-17 (×3): 5 [IU] via SUBCUTANEOUS

## 2015-12-16 MED ORDER — INSULIN ASPART 100 UNIT/ML ~~LOC~~ SOLN
0.0000 [IU] | Freq: Three times a day (TID) | SUBCUTANEOUS | Status: DC
  Administered 2015-12-16 – 2015-12-17 (×3): 20 [IU] via SUBCUTANEOUS
  Administered 2015-12-17: 11 [IU] via SUBCUTANEOUS
  Administered 2015-12-17: 15 [IU] via SUBCUTANEOUS
  Administered 2015-12-18: 11 [IU] via SUBCUTANEOUS
  Administered 2015-12-18: 15 [IU] via SUBCUTANEOUS
  Administered 2015-12-18: 11 [IU] via SUBCUTANEOUS
  Administered 2015-12-19: 4 [IU] via SUBCUTANEOUS

## 2015-12-16 MED ORDER — INSULIN ASPART 100 UNIT/ML ~~LOC~~ SOLN
0.0000 [IU] | Freq: Every day | SUBCUTANEOUS | Status: DC
  Administered 2015-12-16: 5 [IU] via SUBCUTANEOUS
  Administered 2015-12-17: 4 [IU] via SUBCUTANEOUS
  Administered 2015-12-19: 2 [IU] via SUBCUTANEOUS

## 2015-12-16 NOTE — Progress Notes (Signed)
Subjective: He says he feels better. He is more alert. He is on nasal oxygen.  Objective: Vital signs in last 24 hours: Temp:  [97 F (36.1 C)-97.4 F (36.3 C)] 97.4 F (36.3 C) (08/03 0737) Pulse Rate:  [53-73] 56 (08/03 0600) Resp:  [16-25] 18 (08/03 0600) BP: (94-144)/(47-122) 103/61 (08/03 0600) SpO2:  [67 %-96 %] 94 % (08/03 0600) FiO2 (%):  [40 %] 40 % (08/03 0125) Weight:  [167.2 kg (368 lb 9.8 oz)] 167.2 kg (368 lb 9.8 oz) (08/03 0500) Weight change: -0.1 kg (-3.5 oz) Last BM Date: 12/15/15  Intake/Output from previous day: 08/02 0701 - 08/03 0700 In: 1680 [P.O.:1680] Out: 2450 [Urine:2450]  PHYSICAL EXAM General appearance: alert, cooperative, no distress and morbidly obese Resp: rhonchi bilaterally Cardio: regular rate and rhythm, S1, S2 normal, no murmur, click, rub or gallop GI: soft, non-tender; bowel sounds normal; no masses,  no organomegaly Extremities: extremities normal, atraumatic, no cyanosis or edema  Lab Results:  Results for orders placed or performed during the hospital encounter of 12/13/15 (from the past 48 hour(s))  Glucose, capillary     Status: Abnormal   Collection Time: 12/14/15 11:42 AM  Result Value Ref Range   Glucose-Capillary 377 (H) 65 - 99 mg/dL  Glucose, capillary     Status: Abnormal   Collection Time: 12/14/15  4:09 PM  Result Value Ref Range   Glucose-Capillary 399 (H) 65 - 99 mg/dL  Glucose, capillary     Status: Abnormal   Collection Time: 12/14/15  8:19 PM  Result Value Ref Range   Glucose-Capillary 440 (H) 65 - 99 mg/dL   Comment 1 Notify RN   Glucose, capillary     Status: Abnormal   Collection Time: 12/14/15 11:51 PM  Result Value Ref Range   Glucose-Capillary 367 (H) 65 - 99 mg/dL   Comment 1 Notify RN   Glucose, capillary     Status: Abnormal   Collection Time: 12/15/15  3:51 AM  Result Value Ref Range   Glucose-Capillary 275 (H) 65 - 99 mg/dL   Comment 1 Notify RN   Basic metabolic panel     Status: Abnormal   Collection Time: 12/15/15  4:31 AM  Result Value Ref Range   Sodium 139 135 - 145 mmol/[name]   Potassium 4.0 3.5 - 5.1 mmol/[name]   Chloride 93 ([name]) 101 - 111 mmol/[name]   CO2 37 (H) 22 - 32 mmol/[name]   Glucose, Bld 259 (H) 65 - 99 mg/dL   BUN 50 (H) 6 - 20 mg/dL   Creatinine, Ser 1.47 (H) 0.61 - 1.24 mg/dL   Calcium 8.6 ([name]) 8.9 - 10.3 mg/dL   GFR calc non Af Amer 54 ([name]) >60 mL/min   GFR calc Af Amer >60 >60 mL/min    Comment: (NOTE) The eGFR has been calculated using the CKD EPI equation. This calculation has not been validated in all clinical situations. eGFR's persistently <60 mL/min signify possible Chronic Kidney Disease.    Anion gap 9 5 - 15  CBC     Status: Abnormal   Collection Time: 12/15/15  4:31 AM  Result Value Ref Range   WBC 9.2 4.0 - 10.5 K/uL   RBC 3.79 ([name]) 4.22 - 5.81 MIL/uL   Hemoglobin 12.7 ([name]) 13.0 - 17.0 g/dL   HCT 40.1 39.0 - 52.0 %   MCV 105.8 (H) 78.0 - 100.0 fL   MCH 33.5 26.0 - 34.0 pg   MCHC 31.7 30.0 - 36.0 g/dL   RDW  14.5 11.5 - 15.5 %   Platelets 140 ([name]) 150 - 400 K/uL  Glucose, capillary     Status: Abnormal   Collection Time: 12/15/15  7:44 AM  Result Value Ref Range   Glucose-Capillary 245 (H) 65 - 99 mg/dL   Comment 1 Notify RN    Comment 2 Document in Chart   Glucose, capillary     Status: Abnormal   Collection Time: 12/15/15 11:25 AM  Result Value Ref Range   Glucose-Capillary 358 (H) 65 - 99 mg/dL   Comment 1 Notify RN    Comment 2 Document in Chart   Glucose, capillary     Status: Abnormal   Collection Time: 12/15/15  4:43 PM  Result Value Ref Range   Glucose-Capillary 366 (H) 65 - 99 mg/dL  Glucose, capillary     Status: Abnormal   Collection Time: 12/15/15  7:46 PM  Result Value Ref Range   Glucose-Capillary 447 (H) 65 - 99 mg/dL   Comment 1 Notify RN    Comment 2 Document in Chart   Glucose, capillary     Status: Abnormal   Collection Time: 12/15/15 11:45 PM  Result Value Ref Range   Glucose-Capillary 327 (H) 65 - 99 mg/dL   Comment 1  Notify RN    Comment 2 Document in Chart   Glucose, capillary     Status: Abnormal   Collection Time: 12/16/15  3:55 AM  Result Value Ref Range   Glucose-Capillary 338 (H) 65 - 99 mg/dL   Comment 1 Notify RN    Comment 2 Document in Chart   Basic metabolic panel     Status: Abnormal   Collection Time: 12/16/15  4:36 AM  Result Value Ref Range   Sodium 134 ([name]) 135 - 145 mmol/[name]   Potassium 4.2 3.5 - 5.1 mmol/[name]   Chloride 93 ([name]) 101 - 111 mmol/[name]   CO2 36 (H) 22 - 32 mmol/[name]   Glucose, Bld 325 (H) 65 - 99 mg/dL   BUN 58 (H) 6 - 20 mg/dL   Creatinine, Ser 1.54 (H) 0.61 - 1.24 mg/dL   Calcium 8.1 ([name]) 8.9 - 10.3 mg/dL   GFR calc non Af Amer 51 ([name]) >60 mL/min   GFR calc Af Amer 59 ([name]) >60 mL/min    Comment: (NOTE) The eGFR has been calculated using the CKD EPI equation. This calculation has not been validated in all clinical situations. eGFR's persistently <60 mL/min signify possible Chronic Kidney Disease.    Anion gap 5 5 - 15  Glucose, capillary     Status: Abnormal   Collection Time: 12/16/15  7:32 AM  Result Value Ref Range   Glucose-Capillary 318 (H) 65 - 99 mg/dL   Comment 1 Notify RN    Comment 2 Document in Chart     ABGS  Recent Labs  12/13/15 1810  PHART 7.407  PO2ART 118*  HCO3 32.5*   CULTURES Recent Results (from the past 240 hour(s))  MRSA PCR Screening     Status: None   Collection Time: 12/13/15  3:00 PM  Result Value Ref Range Status   MRSA by PCR NEGATIVE NEGATIVE Final    Comment:        The GeneXpert MRSA Assay (FDA approved for NASAL specimens only), is one component of a comprehensive MRSA colonization surveillance program. It is not intended to diagnose MRSA infection nor to guide or monitor treatment for MRSA infections.    Studies/Results: No results found.  Medications:  Prior  to Admission:  Prescriptions Prior to Admission  Medication Sig Dispense Refill Last Dose  . ARIPiprazole (ABILIFY) 20 MG tablet Take 20 mg by mouth daily.    12/12/2015 at Unknown time  . aspirin 81 MG tablet Take 81 mg by mouth daily.   12/12/2015 at Unknown time  . atenolol (TENORMIN) 25 MG tablet Take 25 mg by mouth daily.    12/12/2015 at Unknown time  . calcium citrate (CALCITRATE - DOSED IN MG ELEMENTAL CALCIUM) 950 MG tablet Take 1 tablet by mouth daily.   12/12/2015 at Unknown time  . cholecalciferol (VITAMIN D) 1000 UNITS tablet Take 1,000 Units by mouth daily. Reported on 05/03/2015   12/12/2015 at 1000  . citalopram (CELEXA) 40 MG tablet Take 40 mg by mouth at bedtime.    12/12/2015 at 2200  . Fe Bisgly-Succ-C-Thre-B12-FA (IRON-150 PO) Take 1 tablet by mouth daily.   12/12/2015 at 1000  . fluticasone furoate-vilanterol (BREO ELLIPTA) 100-25 MCG/INH AEPB Inhale 1 puff into the lungs daily.   12/12/2015 at Unknown time  . gabapentin (NEURONTIN) 300 MG capsule Take 300 mg by mouth 3 (three) times daily.   12/12/2015 at Unknown time  . guaiFENesin (MUCINEX) 600 MG 12 hr tablet Take 600 mg by mouth 2 (two) times daily.    12/12/2015 at Unknown time  . insulin aspart (NOVOLOG FLEXPEN) 100 UNIT/ML FlexPen Inject 2-10 Units into the skin 4 (four) times daily -  before meals and at bedtime. 150-200=2units Call np/md if less than 70 201-250=4units 251-300=6units 301-350=8units 351-400=10units Greater than 400 call np and md   12/13/2015 at Unknown time  . Insulin Glargine (LANTUS SOLOSTAR) 100 UNIT/ML Solostar Pen Inject 20 Units into the skin at bedtime.   12/12/2015 at 2200  . ipratropium-albuterol (DUONEB) 0.5-2.5 (3) MG/3ML SOLN Take 3 mLs by nebulization 3 (three) times daily. 360 mL 3 12/12/2015 at Unknown time  . lactulose, encephalopathy, (CHRONULAC) 10 GM/15ML SOLN Take 14.5 g by mouth 3 (three) times daily. 37ms 3 times daily   12/12/2015 at Unknown time  . Melatonin 5 MG CAPS Take 1 capsule by mouth at bedtime.   12/12/2015 at 2200  . metFORMIN (GLUCOPHAGE) 500 MG tablet Take 1 tablet (500 mg total) by mouth 2 (two) times daily with a meal. 60 tablet  0 12/12/2015 at Unknown time  . OXYGEN Inhale 3 [name] into the lungs daily as needed (for breathing).    12/12/2015 at 2200  . rifaximin (XIFAXAN) 200 MG tablet Take 200 mg by mouth daily.   12/12/2015 at 1000  . torsemide (DEMADEX) 20 MG tablet Take 40 mg by mouth 2 (two) times daily.    12/12/2015 at Unknown time  . traZODone (DESYREL) 50 MG tablet Take 150 mg by mouth at bedtime.   12/12/2015 at 2100  . HYDROcodone-acetaminophen (NORCO/VICODIN) 5-325 MG tablet Take 1 tablet by mouth every 6 (six) hours.    unknown  . polyethylene glycol (MIRALAX / GLYCOLAX) packet Take 17 g by mouth daily as needed for mild constipation or moderate constipation.    unknown  . PROAIR HFA 108 (90 BASE) MCG/ACT inhaler Inhale 2 puffs into the lungs every 6 (six) hours as needed for wheezing or shortness of breath. Shortness of breath/wheeze   unknown  . sennosides-docusate sodium (SENOKOT-S) 8.6-50 MG tablet Take 1 tablet by mouth daily as needed for constipation.    unknown  . [DISCONTINUED] doxycycline (VIBRA-TABS) 100 MG tablet Take 1 tablet (100 mg total) by mouth 2 (two) times daily. (Patient  not taking: Reported on 12/13/2015) 60 tablet 1 Not Taking at Unknown time   Scheduled: . antiseptic oral rinse  7 mL Mouth Rinse q12n4p  . ARIPiprazole  20 mg Oral Daily  . aspirin  81 mg Oral Daily  . atenolol  25 mg Oral Daily  . azithromycin  250 mg Oral Daily  . chlorhexidine  15 mL Mouth Rinse BID  . citalopram  40 mg Oral QHS  . fluticasone furoate-vilanterol  1 puff Inhalation Daily  . gabapentin  300 mg Oral TID  . guaiFENesin  1,200 mg Oral BID  . heparin  5,000 Units Subcutaneous Q8H  . insulin aspart  0-20 Units Subcutaneous TID WC  . insulin aspart  0-5 Units Subcutaneous QHS  . insulin aspart  5 Units Subcutaneous TID WC  . insulin glargine  25 Units Subcutaneous QHS  . ipratropium-albuterol  3 mL Nebulization Q6H  . lactulose  14.5 g Oral TID  . methylPREDNISolone (SOLU-MEDROL) injection  60 mg Intravenous  Q6H  . rifaximin  200 mg Oral Daily  . sodium chloride flush  3 mL Intravenous Q12H  . traZODone  150 mg Oral QHS   Continuous:  MMO:CAREQJ chloride, acetaminophen, albuterol, ondansetron (ZOFRAN) IV, sodium chloride flush  Assesment: He was admitted with COPD exacerbation and acute on chronic hypoxic/hypercapnic respiratory failure. He has been improving. He required BiPAP. There was concern that he might have acute heart failure but that appears not to be the situation.  He has some element of obesity hypoventilation and is known to have sleep apnea. This seems to be stable now  He has cirrhosis of the liver which also seems stable  He had acute kidney injury but his creatinine is now around baseline Active Problems:   COPD exacerbation (HCC)   Cirrhosis (HCC)   CKD (chronic kidney disease) stage 3, GFR 30-59 ml/min   Essential hypertension   GERD without esophagitis   Thrombocytopenia (HCC)   Acute on chronic respiratory failure with hypoxia (HCC)   DNR (do not resuscitate)   Diabetes mellitus ([place])   CHF (congestive heart failure) ([place])   Respiratory failure ([place])    Plan: Continue treatments. He looks significantly better today.    LOS: 3 days   Sailor Jorge [name] 12/16/2015, 8:18 AM

## 2015-12-16 NOTE — Progress Notes (Signed)
PROGRESS NOTE  Jorge [name] [id number] DOB: Jun 14, 1965 DOA: 12/13/2015 PCP: Hilbert Corrigan, MD  Brief Narrative: 50 year old man presented to the emergency department from [facility] with complaints of SOB for 1-2 weeks, felt to be related to COPD with an element of CHF. During ED evaluation, he received nebulizers, lasix and steroids and initially showed improvement, but subsequently became somnolent. ABG was performed and showed CO2 retention. He was started on BiPAP and referred for further management. Pulmonology has been consulted.  Assessment/Plan: 1. Acute on chronic respiratory failure with acute/chronic hypoxia and hypercapnea with acute respiratory acidosis. Slowly improving. Secondary COPD.  2. COPD exacerbation. Slow to improve. Decreased breath sounds bilaterally and wheeze on exam as well as increased respiratory effort. Continue BiPAP as needed. Recommend BiPAP use at night. 3. Acute CHF was considered on admission, however chest x-ray findings are chronic compared to previous study August 2016, echocardiogram showed normal left ventricular systolic and diastolic function and normal right ventricular function. BNP was only minimally elevated. EKG on admission sinus rhythm, anteroseptal MI, old. No acute changes. Acute CHF ruled out. 4. Cirrhosis, alcoholic, per chart. Stable. 5. DM type 2. Poor control. Oral agents being held. On SSI and basal insulin. CBG high on steroids. 6. 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 Cr near baseline.  7. Thrombocytopenia. Likely related to cirrhosis. No evidence of bleeding currently. 8. HTN. Stable. 9. Heartburn. Tums 10. OSA. BiPAP at night.   Improving. Continue breathing treatments. Continue current COPD treatments. BiPAP PRN.   Rx for heartburn.  Adjust Lantus, add meal coverage Novolog.  DVT prophylaxis:heparin Code Status:DNR Family Communication:no family present at bedside. Disposition Plan:discharge back to [facility]  when improved. Suggest establishing HCPOA.  Murray Hodgkins, MD  [facility] Direct contact: [phone] --Via amion app OR  --[email or web address]; password [email or web address]  7PM-7AM contact night coverage as above 12/16/2015, 5:45 AM  LOS: 3 days   Consultants:  Pulmonology  Procedures:  BiPAP  ECHO Study Conclusions  - Left ventricle: The cavity size was at the upper limits of   normal. Wall thickness was increased in a pattern of mild LVH.   Systolic function was normal. The estimated ejection fraction was   in the range of 60% to 65%. Left ventricular diastolic function   parameters were normal. - Aortic valve: Poorly visualized. Mildly calcified annulus. Mean   gradient (S): 7 mm Hg. Valve area (VTI): 1.84 cm^2. Valve area   (Vmax): 1.91 cm^2. - Mitral valve: Calcified annulus. There was trivial regurgitation. - Tricuspid valve: There was trivial regurgitation. - Pulmonary arteries: Systolic pressure could not be accurately   estimated. - Pericardium, extracardiac: There was no pericardial effusion.  Antimicrobials:  Zithromax 7/31 >>  HPI/Subjective: Reports tiredness today, did not sleep well because multiple things kept him up. Used BiPAP yesterday several times. Did not use BiPAP at night. Breathing has improved today. No nausea or vomiting. Eating well. Reports heartburn this morning.  Objective: Vitals:   12/16/15 0200 12/16/15 0300 12/16/15 0400 12/16/15 0500  BP: (!) 98/47 129/62 (!) 109/58 115/62  Pulse: (!) 59 68 64 (!) 53  Resp: 18 16 19  (!) 21  Temp:      TempSrc:      SpO2: 96% 92% 94% 91%  Weight:    (!) 167.2 kg (368 lb 9.8 oz)  Height:        Intake/Output Summary (Last 24 hours) at 12/16/15 0545 Last data filed at 12/16/15 0400  Gross per 24 hour  Intake  1680 ml  Output             1450 ml  Net              230 ml     Filed Weights   12/14/15 0500 12/15/15 0500 12/16/15 0500  Weight: (!) 168.3 kg (371 lb 0.6 oz) (!) 167.3 kg  (368 lb 13.3 oz) (!) 167.2 kg (368 lb 9.8 oz)    Exam: Constitutional:  . Appears calm and comfortable Respiratory:  . Bilateral wheeze, poor air movement.  . mildyl increased respiratory effort. Speaks in full sentences Cardiovascular:  . RRR, no m/r/g . Trace LE extremity edema   . Telemetry SR Abdomen:  . no tenderness or masses Psychiatric:  . judgement and insight appear normal . Mental status o Mood, affect appropriate  I have personally reviewed following labs and imaging studies:  BMP stable  Cr 1.54, BUN 58  CBG 200-400's  Scheduled Meds: . antiseptic oral rinse  7 mL Mouth Rinse q12n4p  . ARIPiprazole  20 mg Oral Daily  . aspirin  81 mg Oral Daily  . atenolol  25 mg Oral Daily  . azithromycin  250 mg Oral Daily  . chlorhexidine  15 mL Mouth Rinse BID  . citalopram  40 mg Oral QHS  . fluticasone furoate-vilanterol  1 puff Inhalation Daily  . gabapentin  300 mg Oral TID  . guaiFENesin  1,200 mg Oral BID  . heparin  5,000 Units Subcutaneous Q8H  . insulin aspart  0-20 Units Subcutaneous Q4H  . insulin glargine  25 Units Subcutaneous QHS  . ipratropium-albuterol  3 mL Nebulization Q6H  . lactulose  14.5 g Oral TID  . methylPREDNISolone (SOLU-MEDROL) injection  60 mg Intravenous Q6H  . rifaximin  200 mg Oral Daily  . sodium chloride flush  3 mL Intravenous Q12H  . traZODone  150 mg Oral QHS   Continuous Infusions:   Active Problems:   COPD exacerbation (HCC)   Cirrhosis ([place])   CKD (chronic kidney disease) stage 3, GFR 30-59 ml/min   Essential hypertension   GERD without esophagitis   Thrombocytopenia (HCC)   Acute on chronic respiratory failure with hypoxia (HCC)   DNR (do not resuscitate)   Diabetes mellitus ([place])   CHF (congestive heart failure) ([place])   Respiratory failure ([place])   LOS: 3 days   Time spent 25 minutes  By signing my name below, I, Delene Ruffini, attest that this documentation has been prepared under the direction and in the  presence of Joes. Sarajane Jews, MD. Electronically Signed: Delene Ruffini, Scribe.  12/16/15 8:55am  I personally performed the services described in this documentation. All medical record entries made by the scribe were at my direction. I have reviewed the chart and agree that the record reflects my personal performance and is accurate and complete. Murray Hodgkins, MD

## 2015-12-16 NOTE — Progress Notes (Signed)
Inpatient Diabetes Program Recommendations  AACE/ADA: New Consensus Statement on Inpatient Glycemic Control (2015)  Target Ranges:  Prepandial:   less than 140 mg/dL      Peak postprandial:   less than 180 mg/dL (1-2 hours)      Critically ill patients:  140 - 180 mg/dL   Results for PREET, NEWNHAM (MRN [id number]) as of 12/16/2015 08:24  Ref. Range 12/15/2015 07:44 12/15/2015 11:25 12/15/2015 16:43 12/15/2015 19:46 12/15/2015 23:45 12/16/2015 03:55 12/16/2015 07:32  Glucose-Capillary Latest Ref Range: 65 - 99 mg/dL 245 (H) 358 (H) 366 (H) 447 (H) 327 (H) 338 (H) 318 (H)   Review of Glycemic Control  Diabetes history: DM2 Outpatient Diabetes medications: Lantus 20 units QHS, Novolog 2-10 units QID, Metformin 500 mg BID Current orders for Inpatient glycemic control: Lantus 25 units QHS, Novolog 0-20 units TID with meals, Novolog 0-5 units QHS, Novolog 5 units TID with meals  Inpatient Diabetes Program Recommendations: Insulin - Basal: Please consider increasing Lantus to 33 units QHS (based on 167 x 0.2 units).  Insulin - Meal Coverage: If steroids are continued as ordered, please increase meal coverage to Novolog 10 units TID with meals. HgbA1C: Please consider ordering an A1C to evaluate glycemic control over the past 2-3 months.  Thanks, Barnie Alderman, RN, MSN, CDE Diabetes Coordinator Inpatient Diabetes Program [phone] (Team Pager from [place] to [place]) [phone] ([place] office) [phone] [facility] office) [phone] [facility] office)

## 2015-12-16 NOTE — Plan of Care (Signed)
Problem: Phase II Progression Outcomes Goal: O2 sats > equal to 90% on RA or at baseline Outcome: Not Applicable Date Met: 62/26/33 Pt on chronic o2 at [facility]

## 2015-12-17 LAB — GLUCOSE, CAPILLARY
GLUCOSE-CAPILLARY: 312 mg/dL — AB (ref 65–99)
Glucose-Capillary: 361 mg/dL — ABNORMAL HIGH (ref 65–99)
Glucose-Capillary: 288 mg/dL — ABNORMAL HIGH (ref 65–99)
Glucose-Capillary: 341 mg/dL — ABNORMAL HIGH (ref 65–99)

## 2015-12-17 MED ORDER — METHYLPREDNISOLONE SODIUM SUCC 125 MG IJ SOLR
60.0000 mg | Freq: Two times a day (BID) | INTRAMUSCULAR | Status: DC
  Administered 2015-12-17 – 2015-12-18 (×2): 60 mg via INTRAVENOUS
  Filled 2015-12-17 (×2): qty 2

## 2015-12-17 MED ORDER — INSULIN ASPART 100 UNIT/ML ~~LOC~~ SOLN
8.0000 [IU] | Freq: Three times a day (TID) | SUBCUTANEOUS | Status: DC
  Administered 2015-12-17 – 2015-12-19 (×7): 8 [IU] via SUBCUTANEOUS

## 2015-12-17 MED ORDER — INSULIN GLARGINE 100 UNIT/ML ~~LOC~~ SOLN
38.0000 [IU] | Freq: Every day | SUBCUTANEOUS | Status: DC
  Administered 2015-12-17: 38 [IU] via SUBCUTANEOUS
  Filled 2015-12-17 (×3): qty 0.38

## 2015-12-17 NOTE — Care Management Important Message (Signed)
Important Message  Patient Details  Name: Jorge [name] MRN: [id number] Date of Birth: [birth year]   Medicare Important Message Given:  Yes    Sherald Barge, RN 12/17/2015, 8:41 AM

## 2015-12-17 NOTE — Progress Notes (Signed)
Subjective: He is awake and alert. He says he is just waking up but feels better  Objective: Vital signs in last 24 hours: Temp:  [96.8 F (36 C)-97.6 F (36.4 C)] 97.3 F (36.3 C) (08/04 0752) Pulse Rate:  [49-73] 64 (08/04 0800) Resp:  [11-24] 11 (08/04 0800) BP: (112-150)/(55-119) 129/65 (08/04 0800) SpO2:  [88 %-96 %] 94 % (08/04 0800) FiO2 (%):  [40 %] 40 % (08/03 2355) Weight:  [167.3 kg (368 lb 13.3 oz)] 167.3 kg (368 lb 13.3 oz) (08/04 0400) Weight change: 0.1 kg (3.5 oz) Last BM Date: 12/14/15  Intake/Output from previous day: 08/03 0701 - 08/04 0700 In: 1920 [P.O.:1920] Out: 3100 [Urine:3100]  PHYSICAL EXAM General appearance: alert, cooperative, mild distress and morbidly obese Resp: rhonchi bilaterally Cardio: regular rate and rhythm, S1, S2 normal, no murmur, click, rub or gallop GI: soft, non-tender; bowel sounds normal; no masses,  no organomegaly Extremities: venous stasis dermatitis noted  Lab Results:  Results for orders placed or performed during the hospital encounter of 12/13/15 (from the past 48 hour(s))  Glucose, capillary     Status: Abnormal   Collection Time: 12/15/15 11:25 AM  Result Value Ref Range   Glucose-Capillary 358 (H) 65 - 99 mg/dL   Comment 1 Notify RN    Comment 2 Document in Chart   Glucose, capillary     Status: Abnormal   Collection Time: 12/15/15  4:43 PM  Result Value Ref Range   Glucose-Capillary 366 (H) 65 - 99 mg/dL  Glucose, capillary     Status: Abnormal   Collection Time: 12/15/15  7:46 PM  Result Value Ref Range   Glucose-Capillary 447 (H) 65 - 99 mg/dL   Comment 1 Notify RN    Comment 2 Document in Chart   Glucose, capillary     Status: Abnormal   Collection Time: 12/15/15 11:45 PM  Result Value Ref Range   Glucose-Capillary 327 (H) 65 - 99 mg/dL   Comment 1 Notify RN    Comment 2 Document in Chart   Glucose, capillary     Status: Abnormal   Collection Time: 12/16/15  3:55 AM  Result Value Ref Range    Glucose-Capillary 338 (H) 65 - 99 mg/dL   Comment 1 Notify RN    Comment 2 Document in Chart   Basic metabolic panel     Status: Abnormal   Collection Time: 12/16/15  4:36 AM  Result Value Ref Range   Sodium 134 (L) 135 - 145 mmol/L   Potassium 4.2 3.5 - 5.1 mmol/L   Chloride 93 (L) 101 - 111 mmol/L   CO2 36 (H) 22 - 32 mmol/L   Glucose, Bld 325 (H) 65 - 99 mg/dL   BUN 58 (H) 6 - 20 mg/dL   Creatinine, Ser 2.77 (H) 0.61 - 1.24 mg/dL   Calcium 8.1 (L) 8.9 - 10.3 mg/dL   GFR calc non Af Amer 51 (L) >60 mL/min   GFR calc Af Amer 59 (L) >60 mL/min    Comment: (NOTE) The eGFR has been calculated using the CKD EPI equation. This calculation has not been validated in all clinical situations. eGFR's persistently <60 mL/min signify possible Chronic Kidney Disease.    Anion gap 5 5 - 15  Glucose, capillary     Status: Abnormal   Collection Time: 12/16/15  7:32 AM  Result Value Ref Range   Glucose-Capillary 318 (H) 65 - 99 mg/dL   Comment 1 Notify RN    Comment 2 Document  in Chart   Glucose, capillary     Status: Abnormal   Collection Time: 12/16/15 11:21 AM  Result Value Ref Range   Glucose-Capillary 370 (H) 65 - 99 mg/dL   Comment 1 Notify RN    Comment 2 Document in Chart   Glucose, capillary     Status: Abnormal   Collection Time: 12/16/15  4:07 PM  Result Value Ref Range   Glucose-Capillary 357 (H) 65 - 99 mg/dL   Comment 1 Notify RN    Comment 2 Document in Chart   Glucose, capillary     Status: Abnormal   Collection Time: 12/16/15 10:46 PM  Result Value Ref Range   Glucose-Capillary 435 (H) 65 - 99 mg/dL   Comment 1 Notify RN    Comment 2 Document in Chart   Glucose, capillary     Status: Abnormal   Collection Time: 12/17/15  7:45 AM  Result Value Ref Range   Glucose-Capillary 312 (H) 65 - 99 mg/dL   Comment 1 Notify RN    Comment 2 Document in Chart     ABGS No results for input(s): PHART, PO2ART, TCO2, HCO3 in the last 72 hours.  Invalid input(s):  PCO2 CULTURES Recent Results (from the past 240 hour(s))  MRSA PCR Screening     Status: None   Collection Time: 12/13/15  3:00 PM  Result Value Ref Range Status   MRSA by PCR NEGATIVE NEGATIVE Final    Comment:        The GeneXpert MRSA Assay (FDA approved for NASAL specimens only), is one component of a comprehensive MRSA colonization surveillance program. It is not intended to diagnose MRSA infection nor to guide or monitor treatment for MRSA infections.    Studies/Results: No results found.  Medications:  Prior to Admission:  Prescriptions Prior to Admission  Medication Sig Dispense Refill Last Dose  . ARIPiprazole (ABILIFY) 20 MG tablet Take 20 mg by mouth daily.   12/12/2015 at Unknown time  . aspirin 81 MG tablet Take 81 mg by mouth daily.   12/12/2015 at Unknown time  . atenolol (TENORMIN) 25 MG tablet Take 25 mg by mouth daily.    12/12/2015 at Unknown time  . calcium citrate (CALCITRATE - DOSED IN MG ELEMENTAL CALCIUM) 950 MG tablet Take 1 tablet by mouth daily.   12/12/2015 at Unknown time  . cholecalciferol (VITAMIN D) 1000 UNITS tablet Take 1,000 Units by mouth daily. Reported on 05/03/2015   12/12/2015 at 1000  . citalopram (CELEXA) 40 MG tablet Take 40 mg by mouth at bedtime.    12/12/2015 at 2200  . Fe Bisgly-Succ-C-Thre-B12-FA (IRON-150 PO) Take 1 tablet by mouth daily.   12/12/2015 at 1000  . fluticasone furoate-vilanterol (BREO ELLIPTA) 100-25 MCG/INH AEPB Inhale 1 puff into the lungs daily.   12/12/2015 at Unknown time  . gabapentin (NEURONTIN) 300 MG capsule Take 300 mg by mouth 3 (three) times daily.   12/12/2015 at Unknown time  . guaiFENesin (MUCINEX) 600 MG 12 hr tablet Take 600 mg by mouth 2 (two) times daily.    12/12/2015 at Unknown time  . insulin aspart (NOVOLOG FLEXPEN) 100 UNIT/ML FlexPen Inject 2-10 Units into the skin 4 (four) times daily -  before meals and at bedtime. 150-200=2units Call np/md if less than  70 201-250=4units 251-300=6units 301-350=8units 351-400=10units Greater than 400 call np and md   12/13/2015 at Unknown time  . Insulin Glargine (LANTUS SOLOSTAR) 100 UNIT/ML Solostar Pen Inject 20 Units into the skin at bedtime.  12/12/2015 at 2200  . ipratropium-albuterol (DUONEB) 0.5-2.5 (3) MG/3ML SOLN Take 3 mLs by nebulization 3 (three) times daily. 360 mL 3 12/12/2015 at Unknown time  . lactulose, encephalopathy, (CHRONULAC) 10 GM/15ML SOLN Take 14.5 g by mouth 3 (three) times daily. 74ms 3 times daily   12/12/2015 at Unknown time  . Melatonin 5 MG CAPS Take 1 capsule by mouth at bedtime.   12/12/2015 at 2200  . metFORMIN (GLUCOPHAGE) 500 MG tablet Take 1 tablet (500 mg total) by mouth 2 (two) times daily with a meal. 60 tablet 0 12/12/2015 at Unknown time  . OXYGEN Inhale 3 L into the lungs daily as needed (for breathing).    12/12/2015 at 2200  . rifaximin (XIFAXAN) 200 MG tablet Take 200 mg by mouth daily.   12/12/2015 at 1000  . torsemide (DEMADEX) 20 MG tablet Take 40 mg by mouth 2 (two) times daily.    12/12/2015 at Unknown time  . traZODone (DESYREL) 50 MG tablet Take 150 mg by mouth at bedtime.   12/12/2015 at 2100  . HYDROcodone-acetaminophen (NORCO/VICODIN) 5-325 MG tablet Take 1 tablet by mouth every 6 (six) hours.    unknown  . polyethylene glycol (MIRALAX / GLYCOLAX) packet Take 17 g by mouth daily as needed for mild constipation or moderate constipation.    unknown  . PROAIR HFA 108 (90 BASE) MCG/ACT inhaler Inhale 2 puffs into the lungs every 6 (six) hours as needed for wheezing or shortness of breath. Shortness of breath/wheeze   unknown  . sennosides-docusate sodium (SENOKOT-S) 8.6-50 MG tablet Take 1 tablet by mouth daily as needed for constipation.    unknown  . [DISCONTINUED] doxycycline (VIBRA-TABS) 100 MG tablet Take 1 tablet (100 mg total) by mouth 2 (two) times daily. (Patient not taking: Reported on 12/13/2015) 60 tablet 1 Not Taking at Unknown time   Scheduled: .  antiseptic oral rinse  7 mL Mouth Rinse q12n4p  . ARIPiprazole  20 mg Oral Daily  . aspirin  81 mg Oral Daily  . atenolol  25 mg Oral Daily  . azithromycin  250 mg Oral Daily  . chlorhexidine  15 mL Mouth Rinse BID  . citalopram  40 mg Oral QHS  . fluticasone furoate-vilanterol  1 puff Inhalation Daily  . gabapentin  300 mg Oral TID  . guaiFENesin  1,200 mg Oral BID  . heparin  5,000 Units Subcutaneous Q8H  . insulin aspart  0-20 Units Subcutaneous TID WC  . insulin aspart  0-5 Units Subcutaneous QHS  . insulin aspart  5 Units Subcutaneous TID WC  . insulin glargine  38 Units Subcutaneous QHS  . ipratropium-albuterol  3 mL Nebulization Q6H  . lactulose  14.5 g Oral TID  . methylPREDNISolone (SOLU-MEDROL) injection  60 mg Intravenous Q6H  . rifaximin  200 mg Oral Daily  . sodium chloride flush  3 mL Intravenous Q12H  . traZODone  150 mg Oral QHS   Continuous:  PLJQ:GBEEFEchloride, acetaminophen, albuterol, calcium carbonate, ondansetron (ZOFRAN) IV, sodium chloride flush  Assesment: He was admitted with acute on chronic hypoxic/hypercapnic respiratory failure. He has been on BiPAP. He is improving. He has COPD exacerbation as well. He has obesity hypoventilation/sleep apnea and is on CPAP at his facility. He has been on chronic oxygen at his facility. He is clearly improving. We may need to investigate BiPAP at his facility rather than the CPAP that he is currently on. Active Problems:   COPD exacerbation ([place]   Cirrhosis ([place]  CKD (chronic kidney disease) stage 3, GFR 30-59 ml/min   Essential hypertension   GERD without esophagitis   Thrombocytopenia (HCC)   Acute on chronic respiratory failure with hypoxia (HCC)   DNR (do not resuscitate)   Diabetes mellitus ([place])   CHF (congestive heart failure) ([place])   Respiratory failure ([place])    Plan: Continue treatments I will discuss with case management    LOS: 4 days   Jorge [name] L 12/17/2015, 8:11 AM

## 2015-12-17 NOTE — Clinical Social Work Note (Signed)
CSW updated [facility] on pt and need for bipap after d/c. Facility remains agreeable to return when medically stable.  Benay Pike, [place]

## 2015-12-17 NOTE — Progress Notes (Signed)
PROGRESS NOTE  Jorge [name] [id number] DOB: Jan 24, 1966 DOA: 12/13/2015 PCP: Hilbert Corrigan, MD  Brief Narrative: 50 year old man presented to the emergency department from [facility] with complaints of SOB for 1-2 weeks, felt to be related to COPD with an element of CHF. During ED evaluation, he received nebulizers, lasix and steroids and initially showed improvement, but subsequently became somnolent. ABG was performed and showed CO2 retention. He was started on BiPAP and referred for further management. Pulmonology consulted and suggested to continue treatments and that he is improving.  Assessment/Plan: 1. Acute on chronic respiratory failure with acute/chronic hypoxia and hypercapnea with acute respiratory acidosis. Continues to improve. 2. COPD exacerbation. Slowly improving. Continue BiPAP at night. 3. Acute CHF was considered on admission, however chest x-ray findings are chronic compared to previous study August 2016, echocardiogram showed normal left ventricular systolic and diastolic function and normal right ventricular function. BNP was only minimally elevated. EKG on admission sinus rhythm, anteroseptal MI, old. No acute changes. Acute CHF ruled out. 4. Cirrhosis, alcoholic, per chart. Stable 5. DM type 2. Remains hyperglycemic secondary to steroids. 6. 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 Creatinine stable. 7. Thrombocytopenia. Likely related to cirrhosis. Stable. 8. HTN. Stable. 9. Heartburn. Tums 10. OSA. BiPAP at night.   Overall improved, did not require BiPAP during the day yesterday.  Continue bronchodilators and abx  Wean steroids  Increase meal coverage and long-acting insulin  Transfer to medical bed  DVT prophylaxis:heparin Code Status:DNR Family Communication:No family at bedside Disposition Plan:discharge back to [facility] 2 days  Murray Hodgkins, MD  [facility] Direct contact: [phone] --[place] [place]  --[email or web address];  password [email or web address]  7PM-7AM contact night coverage as above 12/17/2015, 5:06 AM  LOS: 4 days   Consultants:  Pulmonology  Procedures:  BiPAP  ECHO Study Conclusions  - Left ventricle: The cavity size was at the upper limits of   normal. Wall thickness was increased in a pattern of mild LVH.   Systolic function was normal. The estimated ejection fraction was   in the range of 60% to 65%. Left ventricular diastolic function   parameters were normal. - Aortic valve: Poorly visualized. Mildly calcified annulus. Mean   gradient (S): 7 mm Hg. Valve area (VTI): 1.84 cm^2. Valve area   (Vmax): 1.91 cm^2. - Mitral valve: Calcified annulus. There was trivial regurgitation. - Tricuspid valve: There was trivial regurgitation. - Pulmonary arteries: Systolic pressure could not be accurately   estimated. - Pericardium, extracardiac: There was no pericardial effusion.  Antimicrobials:  Zithromax 7/31 >>  HPI/Subjective: Feeling better. No BiPAP during the day yesterday. Due to use BiPAP last night. Breathing better.  Objective: Vitals:   12/17/15 0300 12/17/15 0323 12/17/15 0400 12/17/15 0412  BP:    132/64  Pulse: 62  67 (!) 55  Resp: 16  19 17   Temp:      TempSrc:      SpO2: 92% 95% 93% 91%  Weight:      Height:        Intake/Output Summary (Last 24 hours) at 12/17/15 0506 Last data filed at 12/17/15 0400  Gross per 24 hour  Intake             1680 ml  Output             2900 ml  Net            -1220 ml     Filed Weights   12/14/15 0500 12/15/15 0500 12/16/15  0500  Weight: (!) 168.3 kg (371 lb 0.6 oz) (!) 167.3 kg (368 lb 13.3 oz) (!) 167.2 kg (368 lb 9.8 oz)    Constitutional:  . Appears calm and comfortable ENMT:  . external ears, nose appear normal . grossly normal hearing  Respiratory:  . CTA bilaterally, no w/r/r. Improved air movement . Decreased respiratory effort. No retractions or accessory muscle use Cardiovascular:  . RRR, no m/r/g . 1+ LE extremity  edema   . Telemetry, SR Psychiatric:  . judgement and insight appear normal . Mental status o Mood, affect appropriate   I have personally reviewed following labs and imaging studies:  CBG 200-400's  Scheduled Meds: . antiseptic oral rinse  7 mL Mouth Rinse q12n4p  . ARIPiprazole  20 mg Oral Daily  . aspirin  81 mg Oral Daily  . atenolol  25 mg Oral Daily  . azithromycin  250 mg Oral Daily  . chlorhexidine  15 mL Mouth Rinse BID  . citalopram  40 mg Oral QHS  . fluticasone furoate-vilanterol  1 puff Inhalation Daily  . gabapentin  300 mg Oral TID  . guaiFENesin  1,200 mg Oral BID  . heparin  5,000 Units Subcutaneous Q8H  . insulin aspart  0-20 Units Subcutaneous TID WC  . insulin aspart  0-5 Units Subcutaneous QHS  . insulin aspart  5 Units Subcutaneous TID WC  . insulin glargine  33 Units Subcutaneous QHS  . ipratropium-albuterol  3 mL Nebulization Q6H  . lactulose  14.5 g Oral TID  . methylPREDNISolone (SOLU-MEDROL) injection  60 mg Intravenous Q6H  . rifaximin  200 mg Oral Daily  . sodium chloride flush  3 mL Intravenous Q12H  . traZODone  150 mg Oral QHS   Continuous Infusions:   Active Problems:   COPD exacerbation (HCC)   Cirrhosis ([place])   CKD (chronic kidney disease) stage 3, GFR 30-59 ml/min   Essential hypertension   GERD without esophagitis   Thrombocytopenia (HCC)   Acute on chronic respiratory failure with hypoxia (HCC)   DNR (do not resuscitate)   Diabetes mellitus ([place])   CHF (congestive heart failure) ([place])   Respiratory failure ([place])   LOS: 4 days   Time spent 25 minutes  By signing my name below, I, Hilbert Odor, attest that this documentation has been prepared under the direction and in the presence of Claudie Rathbone P. Sarajane Jews, MD. Electronically signed: Hilbert Odor, Scribe.  12/17/15, 8:53 AM  I personally performed the services described in this documentation. All medical record entries made by the scribe were at my direction. I have  reviewed the chart and agree that the record reflects my personal performance and is accurate and complete. Murray Hodgkins, MD

## 2015-12-18 LAB — GLUCOSE, CAPILLARY
GLUCOSE-CAPILLARY: 348 mg/dL — AB (ref 65–99)
GLUCOSE-CAPILLARY: 208 mg/dL — AB (ref 65–99)
Glucose-Capillary: 281 mg/dL — ABNORMAL HIGH (ref 65–99)
Glucose-Capillary: 275 mg/dL — ABNORMAL HIGH (ref 65–99)

## 2015-12-18 MED ORDER — PREDNISONE 20 MG PO TABS
40.0000 mg | ORAL_TABLET | Freq: Every day | ORAL | Status: DC
  Administered 2015-12-19: 40 mg via ORAL
  Filled 2015-12-18: qty 2

## 2015-12-18 MED ORDER — INSULIN GLARGINE 100 UNIT/ML ~~LOC~~ SOLN
40.0000 [IU] | Freq: Every day | SUBCUTANEOUS | Status: DC
  Administered 2015-12-18: 40 [IU] via SUBCUTANEOUS
  Filled 2015-12-18 (×2): qty 0.4

## 2015-12-18 NOTE — Progress Notes (Signed)
Patient off BiPAP , asked if he could stay off slept 2hrs on.

## 2015-12-18 NOTE — Progress Notes (Addendum)
PROGRESS NOTE  Jorge [name] [id number] DOB: September 11, 1965 DOA: 12/13/2015 PCP: Hilbert Corrigan, MD  Brief Narrative: 50 year old man presented to the emergency department from [facility] with complaints of SOB for 1-2 weeks, felt to be related to COPD with an element of CHF. During ED evaluation, he received nebulizers, lasix and steroids and initially showed improvement, but subsequently became somnolent. ABG was performed and showed CO2 retention. He was started on BiPAP and referred for further management. Pulmonology consulted and suggested to continue treatments. Likely discharge tomorrow.  Assessment/Plan: 1. Acute on chronic respiratory failure with acute/chronic hypoxia and hypercapnea with acute respiratory acidosis. Acute issues resolve. 2. COPD exacerbation. Now rapidly improving. Appears close to baseline. 3. Acute CHF was considered on admission, however chest x-ray findings are chronic compared to previous study August 2016, echocardiogram showed normal left ventricular systolic and diastolic function and normal right ventricular function. BNP was only minimally elevated. EKG on admission sinus rhythm, anteroseptal MI, old. No acute changes. Acute CHF ruled out. 4. Cirrhosis, alcoholic, per chart. Stable. 5. DM type 2. Remains hyperglycemic secondary to steroids. 6. 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 AKI resolved. 7. Thrombocytopenia. Likely related to cirrhosis. Stable. 8. HTN. Stable. 9. Heartburn. Continue Tums 10. OSA. Continue BiPAP at night.   Overall much improved.  Continue oxygen, wean oxygen back to baseline 2 liters as tolerated  Continue bronchodilators. Change to oral steroids in the morning.  Increase long-acting insulin.  Plan discharge tomorrow.  DVT prophylaxis:heparin Code Status:DNR Family Communication: no family at bedside Disposition Plan:discharge back to [facility] in 24 hours  Murray Hodgkins, MD  [facility] Direct  contact: [phone] --[address] [place]  --[email or web address]; password [email or web address]  7PM-7AM contact night coverage as above 12/18/2015, 6:07 AM  LOS: 5 days   Consultants:  Pulmonology  Procedures:  BiPAP  ECHO Study Conclusions  - Left ventricle: The cavity size was at the upper limits of   normal. Wall thickness was increased in a pattern of mild LVH.   Systolic function was normal. The estimated ejection fraction was   in the range of 60% to 65%. Left ventricular diastolic function   parameters were normal. - Aortic valve: Poorly visualized. Mildly calcified annulus. Mean   gradient (S): 7 mm Hg. Valve area (VTI): 1.84 cm^2. Valve area   (Vmax): 1.91 cm^2. - Mitral valve: Calcified annulus. There was trivial regurgitation. - Tricuspid valve: There was trivial regurgitation. - Pulmonary arteries: Systolic pressure could not be accurately   estimated. - Pericardium, extracardiac: There was no pericardial effusion.  Antimicrobials:  Zithromax 7/31 >> 8/4  HPI/Subjective: Feeling much better today. Breathing much better. Slept fine last night. Did use BiPAP for portion of the night.  Objective: Vitals:   12/17/15 2227 12/17/15 2354 12/18/15 0227 12/18/15 0547  BP: (!) 167/74   (!) 165/84  Pulse: (!) 57 (!) 53  (!) 54  Resp: 18 19  18   Temp: 97.6 F (36.4 C)   97.5 F (36.4 C)  TempSrc: Oral   Oral  SpO2: 93% 93% 93% 94%  Weight:      Height:        Intake/Output Summary (Last 24 hours) at 12/18/15 0607 Last data filed at 12/17/15 2228  Gross per 24 hour  Intake              720 ml  Output              800 ml  Net              -  80 ml     Filed Weights   12/15/15 0500 12/16/15 0500 12/17/15 0400  Weight: (!) 167.3 kg (368 lb 13.3 oz) (!) 167.2 kg (368 lb 9.8 oz) (!) 167.3 kg (368 lb 13.3 oz)     Constitutional:  . Appears calm and comfortable  Respiratory  . Decreased breath sounds, No wheezes, rales or rhonchi.  Normal respiratory effort. No accessory  muscle use. Speaks in full sentences. Cardiovascular:  . RRR, no m/r/g . No LE extremity edema     I have personally reviewed following labs and imaging studies:  CBG 200-400's  Scheduled Meds: . antiseptic oral rinse  7 mL Mouth Rinse q12n4p  . ARIPiprazole  20 mg Oral Daily  . aspirin  81 mg Oral Daily  . atenolol  25 mg Oral Daily  . chlorhexidine  15 mL Mouth Rinse BID  . citalopram  40 mg Oral QHS  . fluticasone furoate-vilanterol  1 puff Inhalation Daily  . gabapentin  300 mg Oral TID  . guaiFENesin  1,200 mg Oral BID  . heparin  5,000 Units Subcutaneous Q8H  . insulin aspart  0-20 Units Subcutaneous TID WC  . insulin aspart  0-5 Units Subcutaneous QHS  . insulin aspart  8 Units Subcutaneous TID WC  . insulin glargine  38 Units Subcutaneous QHS  . ipratropium-albuterol  3 mL Nebulization Q6H  . lactulose  14.5 g Oral TID  . methylPREDNISolone (SOLU-MEDROL) injection  60 mg Intravenous Q12H  . rifaximin  200 mg Oral Daily  . sodium chloride flush  3 mL Intravenous Q12H  . traZODone  150 mg Oral QHS   Continuous Infusions:   Active Problems:   COPD exacerbation (HCC)   Cirrhosis ([place])   CKD (chronic kidney disease) stage 3, GFR 30-59 ml/min   Essential hypertension   GERD without esophagitis   Thrombocytopenia (HCC)   Acute on chronic respiratory failure with hypoxia (HCC)   DNR (do not resuscitate)   Diabetes mellitus ([place])   CHF (congestive heart failure) ([place])   Respiratory failure ([place])   LOS: 5 days   Time spent 15 minutes  By signing my name below, I, Hilbert Odor, attest that this documentation has been prepared under the direction and in the presence of Aloura Matsuoka P. Sarajane Jews, MD. Electronically signed: Hilbert Odor, Scribe.  12/18/15, 2:40 PM    I personally performed the services described in this documentation. All medical record entries made by the scribe were at my direction. I have reviewed the chart and agree that the record reflects my  personal performance and is accurate and complete. Murray Hodgkins, MD

## 2015-12-18 NOTE — Progress Notes (Signed)
He is being prepared for discharge tomorrow. I will sign off.  Thanks for allowing me to see him.

## 2015-12-19 LAB — GLUCOSE, CAPILLARY
GLUCOSE-CAPILLARY: 165 mg/dL — AB (ref 65–99)
Glucose-Capillary: 118 mg/dL — ABNORMAL HIGH (ref 65–99)

## 2015-12-19 MED ORDER — PREDNISONE 10 MG PO TABS: ORAL_TABLET | ORAL | Status: DC

## 2015-12-19 NOTE — Progress Notes (Signed)
Clinical Social Work  CSW was informed by Therapist, sports that pt is medically stable to DC. CSW spoke with Jackelyn Poling at [facility] who confirmed that DC summary was received and they are ready to accept pt. RN to call report to [phone] to room B19. RN to send Norco hard script and DNR with pt when she arranges for non-emergent transportation.  CSW is signing off but available if needed.  Sindy Messing, LCSW Weekend Coverage

## 2015-12-19 NOTE — Progress Notes (Signed)
Patient used BIPAP till about 2 am , at best 2 hrs states he can't breath , not sure if its low settings on Bipap or because he still coughs hard against  Machine. He appears ok on machine but, he is hard to keep pressures because of beard and body size.

## 2015-12-19 NOTE — Progress Notes (Signed)
PROGRESS NOTE  Jorge [name] [id number] DOB: 1966/02/01 DOA: 12/13/2015 PCP: Hilbert Corrigan, MD  Brief Narrative: 50 year old man presented to the emergency department from [facility] with complaints of SOB for 1-2 weeks, felt to be related to COPD with an element of CHF. During ED evaluation, he received nebulizers, lasix and steroids and initially showed improvement, but subsequently became somnolent. ABG was performed and showed CO2 retention. He was started on BiPAP and referred for further management. Pulmonology consulted and suggested to continue treatments. He has shown much improvement.    Assessment/Plan: 1. Acute on chronic respiratory failure with acute/chronic hypoxia and hypercapnea with acute respiratory acidosis. Acute issues resolved. 2. COPD exacerbation. Appears resolved. 3. Acute CHF was considered on admission, however chest x-ray findings are chronic compared to previous study August 2016, echocardiogram showed normal left ventricular systolic and diastolic function and normal right ventricular function. BNP was only minimally elevated. EKG on admission sinus rhythm, anteroseptal MI, old. No acute changes. Acute CHF ruled out. 4. Cirrhosis, alcoholic. Stable. 5. DM type 2. Overall improved. 6. 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 AKI resolved. 7. Thrombocytopenia. Likely related to cirrhosis. Stable. 8. OSA. Continue BiPAP at night.   Overall improved  Continue to wean oxygen to 3 liters as an outpatient  Taper steroids  BiPAP each night at facility  Plan discharge today  Murray Hodgkins, MD  [facility] Direct contact: [phone] --Via amion app OR  --[email or web address]; password [email or web address]  7PM-7AM contact night coverage as above 12/19/2015, 6:17 AM  LOS: 6 days   Consultants:  Pulmonology  Procedures:  BiPAP  ECHO Study Conclusions  - Left ventricle: The cavity size was at the upper limits of   normal. Wall thickness was increased in  a pattern of mild LVH.   Systolic function was normal. The estimated ejection fraction was   in the range of 60% to 65%. Left ventricular diastolic function   parameters were normal. - Aortic valve: Poorly visualized. Mildly calcified annulus. Mean   gradient (S): 7 mm Hg. Valve area (VTI): 1.84 cm^2. Valve area   (Vmax): 1.91 cm^2. - Mitral valve: Calcified annulus. There was trivial regurgitation. - Tricuspid valve: There was trivial regurgitation. - Pulmonary arteries: Systolic pressure could not be accurately   estimated. - Pericardium, extracardiac: There was no pericardial effusion.  Antimicrobials:  Zithromax 7/31 >> 8/4  HPI/Subjective: Doing well today. Eating well. Breathing well. No new issues.  Objective: Vitals:   12/18/15 2228 12/19/15 0020 12/19/15 0152 12/19/15 0610  BP: (!) 141/73   (!) 145/76  Pulse: (!) 57 (!) 57  (!) 58  Resp: (!) 21 17  18   Temp: 97.4 F (36.3 C)   97.4 F (36.3 C)  TempSrc: Oral   Oral  SpO2: 94% 94% 94% 98%  Weight:      Height:        Intake/Output Summary (Last 24 hours) at 12/19/15 0617 Last data filed at 12/18/15 1901  Gross per 24 hour  Intake              716 ml  Output             1525 ml  Net             -809 ml     Filed Weights   12/16/15 0500 12/17/15 0400 12/18/15 0547  Weight: (!) 167.2 kg (368 lb 9.8 oz) (!) 167.3 kg (368 lb 13.3 oz) (!) 168.7 kg (372 lb)   EXAM:  Constitutional:  . Appears calm and comfortable Respiratory:  . CTA bilaterally, no w/r/r.  . Respiratory effort normal. No retractions or accessory muscle use Cardiovascular:  . RRR, no m/r/g . No significant LE extremity edema     I have personally reviewed following labs and imaging studies:  CBG stable  Scheduled Meds: . antiseptic oral rinse  7 mL Mouth Rinse q12n4p  . ARIPiprazole  20 mg Oral Daily  . aspirin  81 mg Oral Daily  . atenolol  25 mg Oral Daily  . chlorhexidine  15 mL Mouth Rinse BID  . citalopram  40 mg Oral QHS  .  fluticasone furoate-vilanterol  1 puff Inhalation Daily  . gabapentin  300 mg Oral TID  . guaiFENesin  1,200 mg Oral BID  . heparin  5,000 Units Subcutaneous Q8H  . insulin aspart  0-20 Units Subcutaneous TID WC  . insulin aspart  0-5 Units Subcutaneous QHS  . insulin aspart  8 Units Subcutaneous TID WC  . insulin glargine  40 Units Subcutaneous QHS  . ipratropium-albuterol  3 mL Nebulization Q6H  . lactulose  14.5 g Oral TID  . predniSONE  40 mg Oral Q breakfast  . rifaximin  200 mg Oral Daily  . sodium chloride flush  3 mL Intravenous Q12H  . traZODone  150 mg Oral QHS   Continuous Infusions:   Principal Problem:   Acute on chronic respiratory failure with hypoxia (HCC) Active Problems:   COPD exacerbation (HCC)   Cirrhosis (HCC)   CKD (chronic kidney disease) stage 3, GFR 30-59 ml/min   Essential hypertension   GERD without esophagitis   Thrombocytopenia ([place])   DNR (do not resuscitate)   Diabetes mellitus ([place])   LOS: 6 days     By signing my name below, I, Hilbert Odor, attest that this documentation has been prepared under the direction and in the presence of [place]. Sarajane Jews, MD. Electronically signed: Hilbert Odor, Scribe.  12/19/15, 9:43 AM   I personally performed the services described in this documentation. All medical record entries made by the scribe were at my direction. I have reviewed the chart and agree that the record reflects my personal performance and is accurate and complete. Murray Hodgkins, MD

## 2015-12-19 NOTE — Progress Notes (Signed)
Patient with orders to be discharge to [facility]. Report called to [facility], nurse. Discharge packet sent to [facility]. Patient stable. Patient transported via EMS.

## 2015-12-19 NOTE — Discharge Summary (Signed)
Physician Discharge Summary  Jorge [name] [id number] DOB: [birth year] DOA: 12/13/2015  PCP: Hilbert Corrigan, MD  Admit date: 12/13/2015 Discharge date: 12/19/2015  Recommendations for Outpatient Follow-up:  1. Follow-up COPD exacerbation. Continue to wean oxygen nasal cannula to chronic baseline requirement 2. Utilize BiPAP each night  Follow-up Information    Hilbert Corrigan, MD. Schedule an appointment as soon as possible for a visit in 1 week(s).   Specialty:  Internal Medicine Contact information: [address] [place] [address] [phone]          Discharge Diagnoses:  1. Acute on chronic respiratory failure with acute/chronic hypoxia and hypercapnea with acute respiratory acidosis 2. COPD exacerbation 3. DM type 2With steroid-induced hyperglycemia 4. AKI superimposed on CKD stage III 5. Thrombocytopenia 6. OSA  Discharge Condition: Stable Disposition: [facility]  Diet recommendation: Carb modified  Filed Weights   12/17/15 0400 12/18/15 0547 12/19/15 0610  Weight: (!) 167.3 kg (368 lb 13.3 oz) (!) 168.7 kg (372 lb) (!) 168.6 kg (371 lb 9.6 oz)    History of present illness:  50 year old man presented to the emergency department from [facility] with complaints of SOB for 1-2 weeks, felt to be related to COPD with an element of CHF. During ED evaluation, he received nebulizers, lasix and steroids and initially showed improvement, but subsequently became somnolent. ABG was performed and showed CO2 retention. He was started on BiPAP and referred for further management.   Hospital Course:  Patient was admitted to the stepdown unit, treated with BiPAP, steroids, bronchodilators. CHF was initially considered but subsequently ruled out. He was seen by pulmonology in consultation. He gradually improved with standard therapy for COPD exacerbation and is now very close to baseline. Hospitalization was uncomplicated. Individual issues as  below.  1. Acute on chronic respiratory failure with acute/chronic hypoxia and hypercapnea with acute respiratory acidosis. Acute issues resolved. 2. COPD exacerbation. Appears resolved. 3. Acute CHF was considered on admission, however chest x-ray findings are chronic compared to previous study August 2016, echocardiogram showed normal left ventricular systolic and diastolic function and normal right ventricular function. BNP was only minimally elevated. EKG on admission sinus rhythm, anteroseptal MI, old. No acute changes. Acute CHF ruled out. 4. Cirrhosis, alcoholic. Stable. 5. DM type 2. Overall improved. 6. 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 AKI resolved. 7. Thrombocytopenia. Likely related to cirrhosis. Stable. 8. OSA. Continue BiPAP at night.  Consultants:  Pulmonology  Procedures:  BiPAP  ECHO Study Conclusions  - Left ventricle: The cavity size was at the upper limits of normal. Wall thickness was increased in a pattern of mild LVH. Systolic function was normal. The estimated ejection fraction was in the range of 60% to 65%. Left ventricular diastolic function parameters were normal. - Aortic valve: Poorly visualized. Mildly calcified annulus. Mean gradient (S): 7 mm Hg. Valve area (VTI): 1.84 cm^2. Valve area (Vmax): 1.91 cm^2. - Mitral valve: Calcified annulus. There was trivial regurgitation. - Tricuspid valve: There was trivial regurgitation. - Pulmonary arteries: Systolic pressure could not be accurately estimated. - Pericardium, extracardiac: There was no pericardial effusion.  Antimicrobials:  Zithromax 7/31 >> 8/4   Discharge Instructions     Medication List    STOP taking these medications   metFORMIN 500 MG tablet Commonly known as:  GLUCOPHAGE     TAKE these medications   ARIPiprazole 20 MG tablet Commonly known as:  ABILIFY Take 20 mg by mouth daily.   aspirin 81 MG tablet Take 81 mg by  mouth daily.   atenolol 25 MG  tablet Commonly known as:  TENORMIN Take 25 mg by mouth daily.   BREO ELLIPTA 100-25 MCG/INH Aepb Generic drug:  fluticasone furoate-vilanterol Inhale 1 puff into the lungs daily.   calcium citrate 950 MG tablet Commonly known as:  CALCITRATE - dosed in mg elemental calcium Take 1 tablet by mouth daily.   cholecalciferol 1000 units tablet Commonly known as:  VITAMIN D Take 1,000 Units by mouth daily. Reported on 05/03/2015   citalopram 40 MG tablet Commonly known as:  CELEXA Take 40 mg by mouth at bedtime.   gabapentin 300 MG capsule Commonly known as:  NEURONTIN Take 300 mg by mouth 3 (three) times daily.   guaiFENesin 600 MG 12 hr tablet Commonly known as:  MUCINEX Take 600 mg by mouth 2 (two) times daily.   HYDROcodone-acetaminophen 5-325 MG tablet Commonly known as:  NORCO/VICODIN Take 1 tablet by mouth every 6 (six) hours.   ipratropium-albuterol 0.5-2.5 (3) MG/3ML Soln Commonly known as:  DUONEB Take 3 mLs by nebulization 3 (three) times daily.   IRON-150 PO Take 1 tablet by mouth daily.   lactulose (encephalopathy) 10 GM/15ML Soln Commonly known as:  CHRONULAC Take 14.5 g by mouth 3 (three) times daily. 49mls 3 times daily   LANTUS SOLOSTAR 100 UNIT/ML Solostar Pen Generic drug:  Insulin Glargine Inject 20 Units into the skin at bedtime.   Melatonin 5 MG Caps Take 1 capsule by mouth at bedtime.   NOVOLOG FLEXPEN 100 UNIT/ML FlexPen Generic drug:  insulin aspart Inject 2-10 Units into the skin 4 (four) times daily -  before meals and at bedtime. 150-200=2units Call np/md if less than 70 201-250=4units 251-300=6units 301-350=8units 351-400=10units Greater than 400 call np and md   OXYGEN Inhale 3 L into the lungs daily as needed (for breathing).   polyethylene glycol packet Commonly known as:  MIRALAX / GLYCOLAX Take 17 g by mouth daily as needed for mild constipation or moderate constipation.   predniSONE 10 MG tablet Commonly known as:   DELTASONE Take daily by mouth: 40 mg x3 days, then 20 mg x3 days, then 10 mg x3 days, then stop.   PROAIR HFA 108 (90 Base) MCG/ACT inhaler Generic drug:  albuterol Inhale 2 puffs into the lungs every 6 (six) hours as needed for wheezing or shortness of breath. Shortness of breath/wheeze   rifaximin 200 MG tablet Commonly known as:  XIFAXAN Take 200 mg by mouth daily.   sennosides-docusate sodium 8.6-50 MG tablet Commonly known as:  SENOKOT-S Take 1 tablet by mouth daily as needed for constipation.   torsemide 20 MG tablet Commonly known as:  DEMADEX Take 40 mg by mouth 2 (two) times daily.   traZODone 50 MG tablet Commonly known as:  DESYREL Take 150 mg by mouth at bedtime.      Allergies  Allergen Reactions  . Penicillins Anaphylaxis    Patient states he is not allergic to anything;  Pt says it's his twin brother that has this allergy.    The results of significant diagnostics from this hospitalization (including imaging, microbiology, ancillary and laboratory) are listed below for reference.    Significant Diagnostic Studies: Dg Chest 1 View  Result Date: 12/13/2015 CLINICAL DATA:  Shortness of breath and cough for 2 weeks EXAM: CHEST 1 VIEW COMPARISON:  December 21, 2014 FINDINGS: There is slight generalized interstitial pulmonary edema. No airspace consolidation. There is cardiomegaly with pulmonary venous hypertension. No adenopathy. No bone lesions. IMPRESSION: Pulmonary  vascular congestion with mild interstitial edema. Suspect a degree of congestive heart failure. No airspace consolidation. Electronically Signed   By: Lowella Grip III M.D.   On: 12/13/2015 09:11   Microbiology: Recent Results (from the past 240 hour(s))  MRSA PCR Screening     Status: None   Collection Time: 12/13/15  3:00 PM  Result Value Ref Range Status   MRSA by PCR NEGATIVE NEGATIVE Final    Comment:        The GeneXpert MRSA Assay (FDA approved for NASAL specimens only), is one  component of a comprehensive MRSA colonization surveillance program. It is not intended to diagnose MRSA infection nor to guide or monitor treatment for MRSA infections.      Labs: Basic Metabolic Panel:  Recent Labs Lab 12/13/15 0853 12/13/15 1755 12/14/15 0430 12/15/15 0431 12/16/15 0436  NA 137  --  139 139 134*  K 3.8  --  3.7 4.0 4.2  CL 92*  --  93* 93* 93*  CO2 35*  --  38* 37* 36*  GLUCOSE 184*  --  234* 259* 325*  BUN 39*  --  45* 50* 58*  CREATININE 1.75* 1.98* 1.60* 1.47* 1.54*  CALCIUM 8.0*  --  8.4* 8.6* 8.1*   Liver Function Tests:  Recent Labs Lab 12/13/15 0853  AST 40  ALT 54  ALKPHOS 111  BILITOT 2.4*  PROT 7.7  ALBUMIN 3.3*    Recent Labs Lab 12/13/15 1755  AMMONIA 37*   CBC:  Recent Labs Lab 12/13/15 0853 12/13/15 1755 12/15/15 0431  WBC 11.9* 7.3 9.2  NEUTROABS 9.9*  --   --   HGB 12.6* 12.1* 12.7*  HCT 39.2 37.2* 40.1  MCV 106.2* 105.7* 105.8*  PLT 106* 106* 140*   Cardiac Enzymes:  Recent Labs Lab 12/13/15 0901  TROPONINI <0.03     Recent Labs  12/13/15 0901  BNP 124.0*    CBG:  Recent Labs Lab 12/18/15 0712 12/18/15 1130 12/18/15 1558 12/18/15 2226 12/19/15 0750  GLUCAP 281* 348* 275* 208* 118*    Principal Problem:   Acute on chronic respiratory failure with hypoxia (HCC) Active Problems:   COPD exacerbation (HCC)   Cirrhosis (HCC)   CKD (chronic kidney disease) stage 3, GFR 30-59 ml/min   Essential hypertension   GERD without esophagitis   Thrombocytopenia (HCC)   DNR (do not resuscitate)   Diabetes mellitus ([place])   Time coordinating discharge: 35 minutes  Signed:  Murray Hodgkins, MD [facility] 12/19/2015, 10:27 AM   By signing my name below, I, Hilbert Odor, attest that this documentation has been prepared under the direction and in the presence of [place]. Sarajane Jews, MD. Electronically signed: Hilbert Odor, Scribe.  12/19/15,   I personally performed the services  described in this documentation. All medical record entries made by the scribe were at my direction. I have reviewed the chart and agree that the record reflects my personal performance and is accurate and complete. Murray Hodgkins, MD

## 2015-12-20 DIAGNOSIS — R062 Wheezing: Secondary | ICD-10-CM | POA: Diagnosis not present

## 2015-12-20 DIAGNOSIS — J441 Chronic obstructive pulmonary disease with (acute) exacerbation: Secondary | ICD-10-CM | POA: Diagnosis not present

## 2015-12-20 DIAGNOSIS — R5381 Other malaise: Secondary | ICD-10-CM | POA: Diagnosis not present

## 2015-12-20 DIAGNOSIS — R05 Cough: Secondary | ICD-10-CM | POA: Diagnosis not present

## 2015-12-21 DIAGNOSIS — F3132 Bipolar disorder, current episode depressed, moderate: Secondary | ICD-10-CM | POA: Diagnosis not present

## 2015-12-21 DIAGNOSIS — F411 Generalized anxiety disorder: Secondary | ICD-10-CM | POA: Diagnosis not present

## 2015-12-22 DIAGNOSIS — J189 Pneumonia, unspecified organism: Secondary | ICD-10-CM | POA: Diagnosis not present

## 2015-12-22 DIAGNOSIS — J449 Chronic obstructive pulmonary disease, unspecified: Secondary | ICD-10-CM | POA: Diagnosis not present

## 2015-12-22 DIAGNOSIS — R4182 Altered mental status, unspecified: Secondary | ICD-10-CM | POA: Diagnosis not present

## 2015-12-22 DIAGNOSIS — R0689 Other abnormalities of breathing: Secondary | ICD-10-CM | POA: Diagnosis not present

## 2015-12-28 DIAGNOSIS — D508 Other iron deficiency anemias: Secondary | ICD-10-CM | POA: Diagnosis not present

## 2015-12-28 DIAGNOSIS — D509 Iron deficiency anemia, unspecified: Secondary | ICD-10-CM | POA: Diagnosis not present

## 2015-12-28 DIAGNOSIS — E559 Vitamin D deficiency, unspecified: Secondary | ICD-10-CM | POA: Diagnosis not present

## 2015-12-28 DIAGNOSIS — N179 Acute kidney failure, unspecified: Secondary | ICD-10-CM | POA: Diagnosis not present

## 2015-12-28 DIAGNOSIS — N19 Unspecified kidney failure: Secondary | ICD-10-CM | POA: Diagnosis not present

## 2015-12-28 DIAGNOSIS — D649 Anemia, unspecified: Secondary | ICD-10-CM | POA: Diagnosis not present

## 2016-01-03 DIAGNOSIS — D509 Iron deficiency anemia, unspecified: Secondary | ICD-10-CM | POA: Diagnosis not present

## 2016-01-03 DIAGNOSIS — J449 Chronic obstructive pulmonary disease, unspecified: Secondary | ICD-10-CM | POA: Diagnosis not present

## 2016-01-03 DIAGNOSIS — J189 Pneumonia, unspecified organism: Secondary | ICD-10-CM | POA: Diagnosis not present

## 2016-01-03 DIAGNOSIS — D649 Anemia, unspecified: Secondary | ICD-10-CM | POA: Diagnosis not present

## 2016-01-03 DIAGNOSIS — Z86010 Personal history of colonic polyps: Secondary | ICD-10-CM | POA: Diagnosis not present

## 2016-01-04 IMAGING — US ABDOMEN ULTRASOUND
1 series · 14 of 25 positions shown · non-contrast
Comparison: 09/09/2013.

CLINICAL DATA: Cirrhosis.  Cholecystectomy.

EXAM:
ULTRASOUND ABDOMEN COMPLETE

[Series 1: abdomen ultrasound · 0.31mm/px · 14 of 64 slices shown]
[im 1/64]
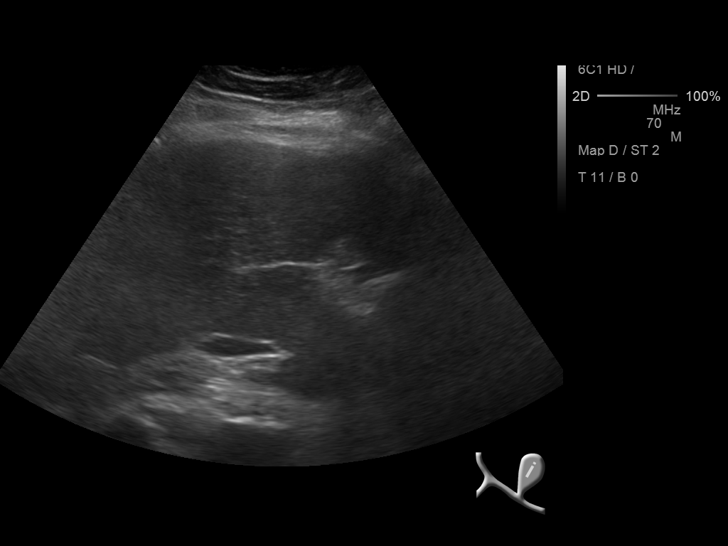
[im 6/64]
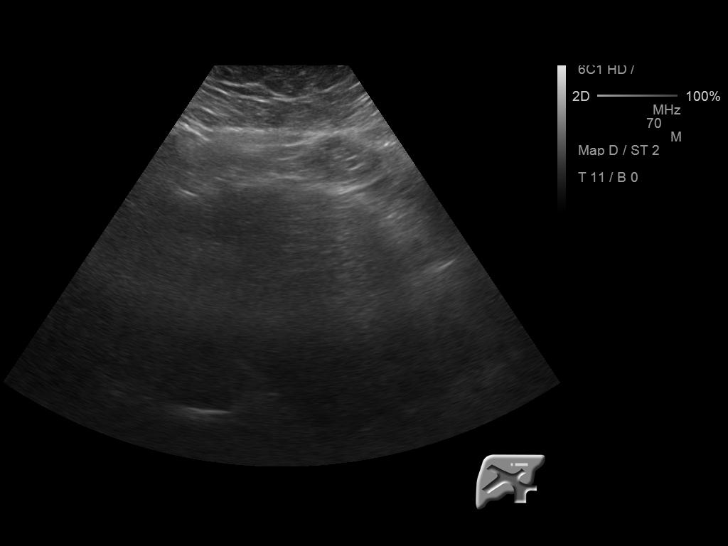
[im 11/64]
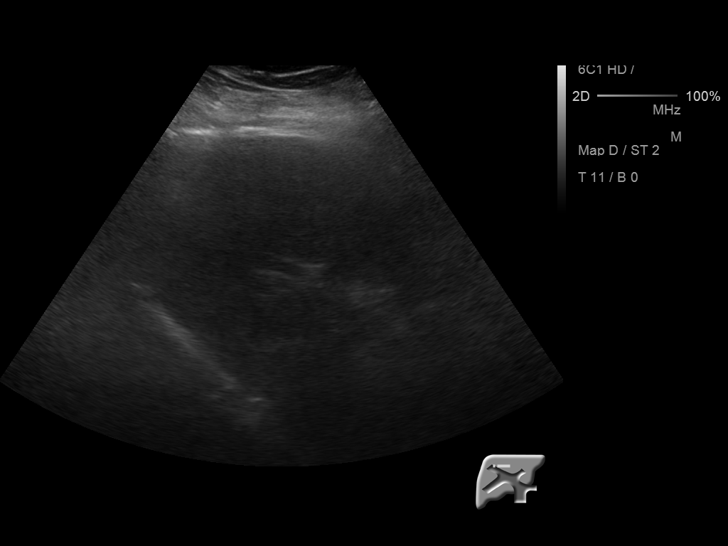
[im 16/64]
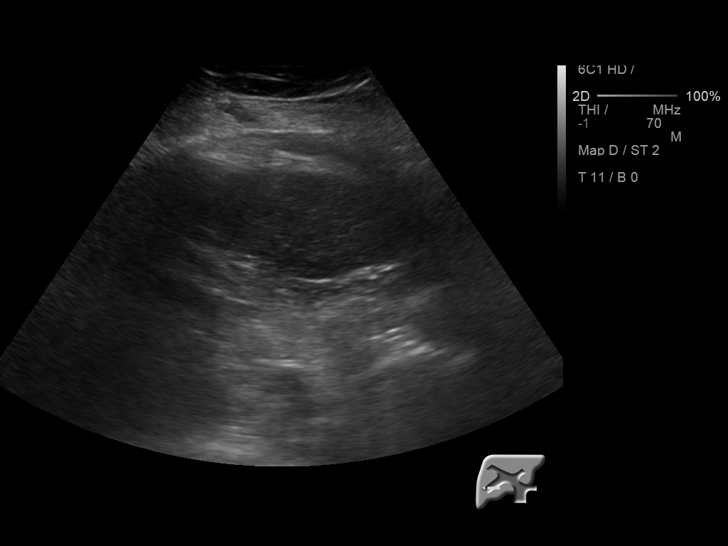
[im 22/64]
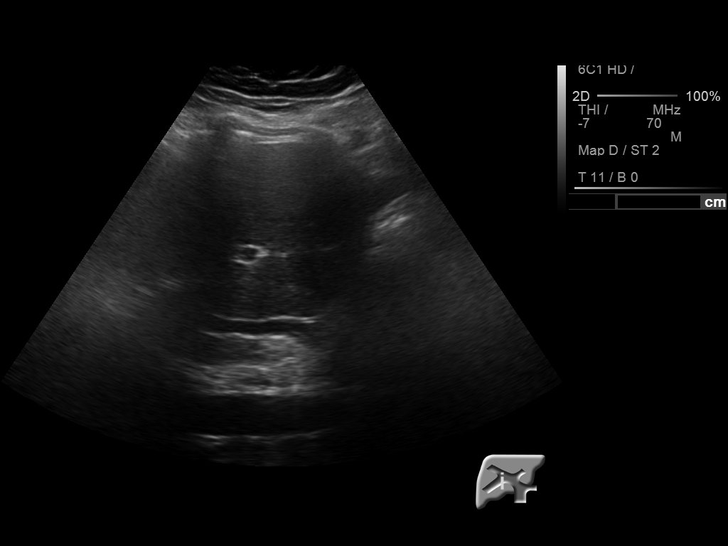
[im 24/64]
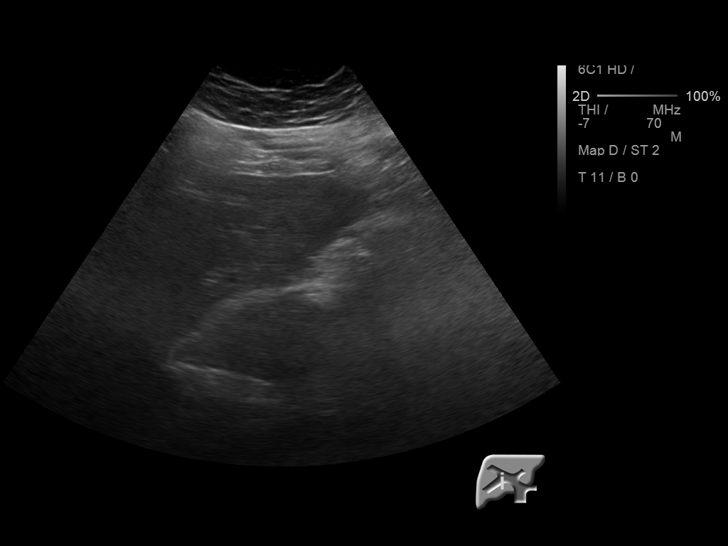
[im 29/64]
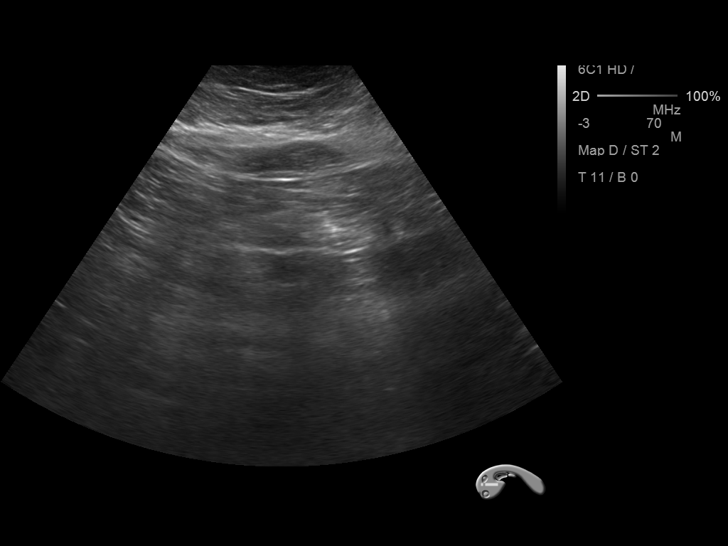
[im 35/64]
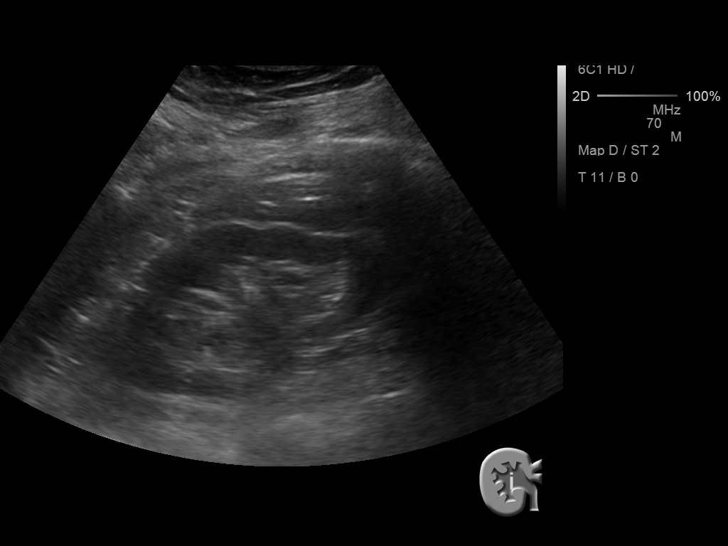
[im 40/64]
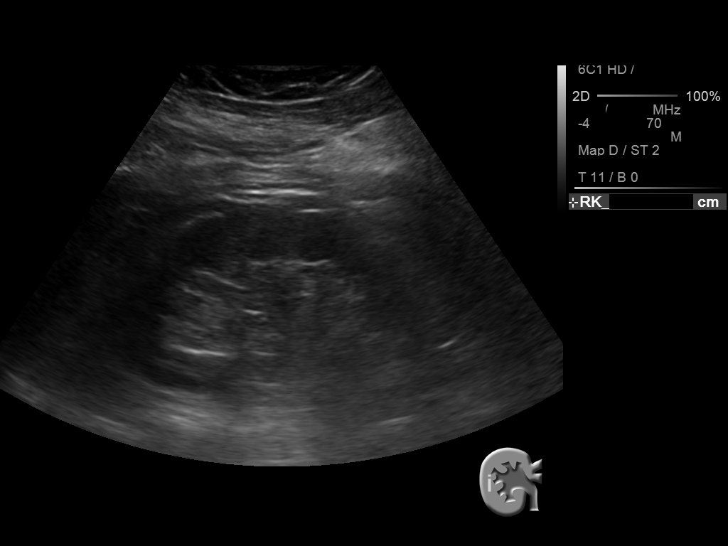
[im 43/64]
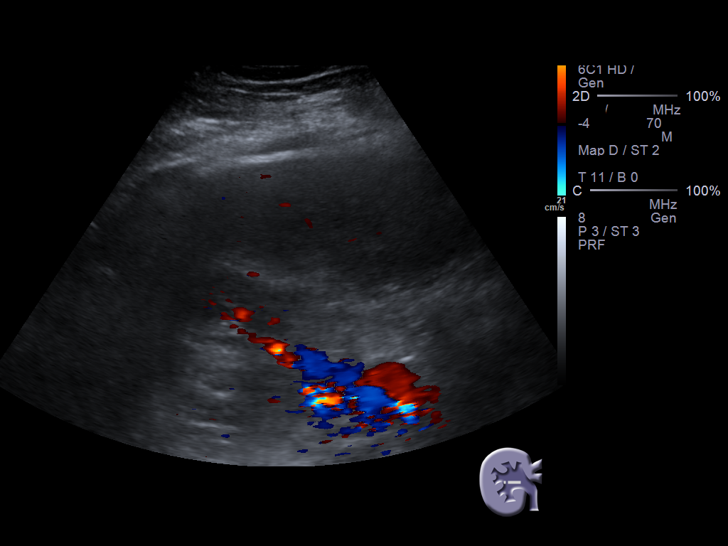
[im 48/64]
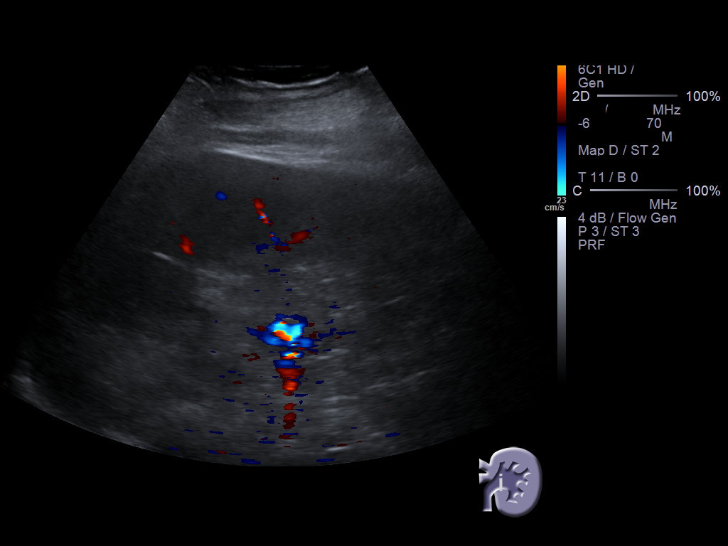
[im 53/64]
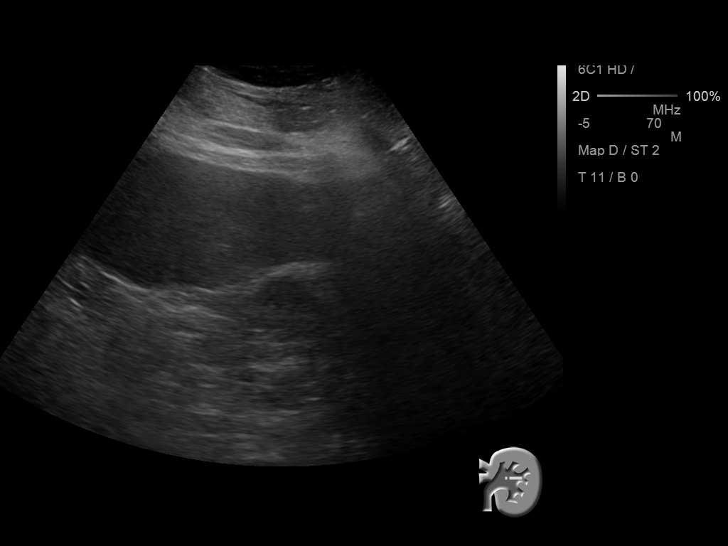
[im 58/64]
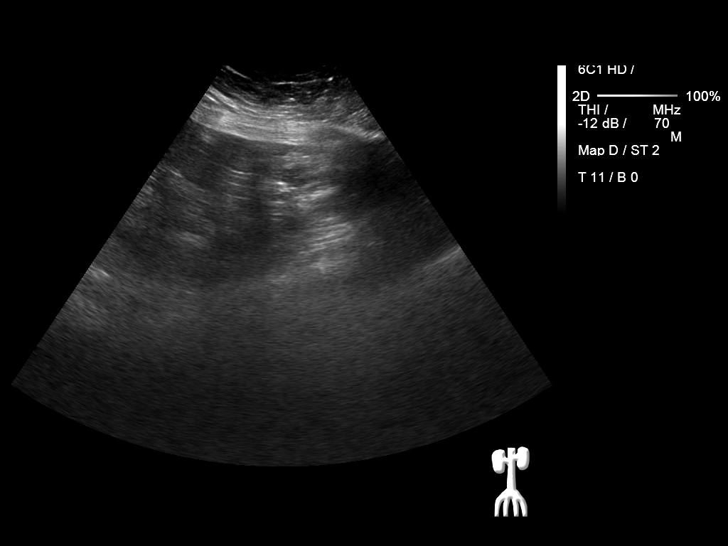
[im 64/64]
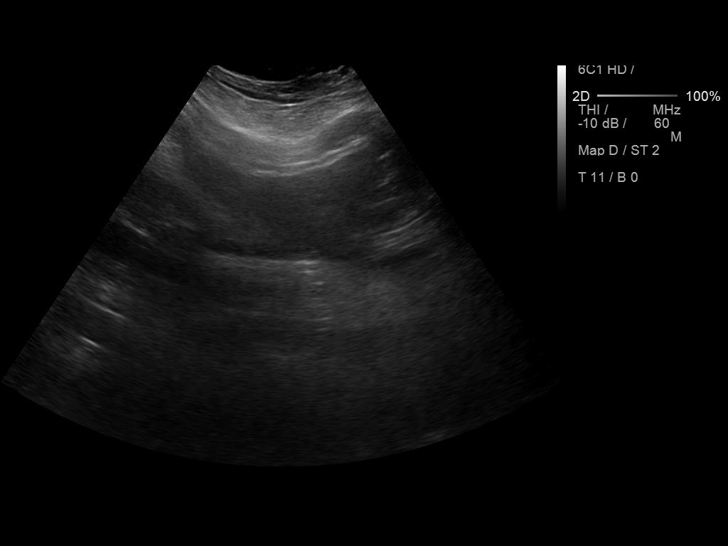

[14 of 25 positions shown; findings below may reference images not displayed]

FINDINGS: Gallbladder: Cholecystectomy.

Common bile duct: Diameter: 4.9 mm

Liver: Liver is echogenic and has a slightly irregular contour
consistent patient's history of cirrhosis. No focal hepatic
abnormality identified.

IVC: No abnormality visualized.

Pancreas: Visualized portion unremarkable.

Spleen: Splenomegaly of 15.5 cm.  Splenic volume 805.1 cc.

Right Kidney: Length: 10.4 cm. Echogenicity within normal limits. No
mass or hydronephrosis visualized.

Left Kidney: Length: 10.3 cm. Echogenicity within normal limits. No
mass or hydronephrosis visualized.

Abdominal aorta: Proximal abdominal aorta measures 2.4 cm in maximum
diameter. Distal abdominal aorta not well visualized due to
overlying bowel gas.

Other findings: None.
IMPRESSION: 1. Liver is echogenic and slightly irregular consistent patient's
history of cirrhosis. Splenomegaly.
2. No acute abnormality identified.  Prior cholecystectomy.

## 2016-01-10 DIAGNOSIS — M2550 Pain in unspecified joint: Secondary | ICD-10-CM | POA: Diagnosis not present

## 2016-01-10 DIAGNOSIS — Z79899 Other long term (current) drug therapy: Secondary | ICD-10-CM | POA: Diagnosis not present

## 2016-01-10 DIAGNOSIS — R69 Illness, unspecified: Secondary | ICD-10-CM | POA: Diagnosis not present

## 2016-01-11 DIAGNOSIS — I10 Essential (primary) hypertension: Secondary | ICD-10-CM | POA: Diagnosis not present

## 2016-01-11 DIAGNOSIS — J449 Chronic obstructive pulmonary disease, unspecified: Secondary | ICD-10-CM | POA: Diagnosis not present

## 2016-01-11 DIAGNOSIS — G4733 Obstructive sleep apnea (adult) (pediatric): Secondary | ICD-10-CM | POA: Diagnosis not present

## 2016-01-11 DIAGNOSIS — J9611 Chronic respiratory failure with hypoxia: Secondary | ICD-10-CM | POA: Diagnosis not present

## 2016-01-13 ENCOUNTER — Ambulatory Visit (INDEPENDENT_AMBULATORY_CARE_PROVIDER_SITE_OTHER): Payer: Medicare Other | Admitting: Cardiovascular Disease

## 2016-01-13 ENCOUNTER — Encounter: Payer: Self-pay | Admitting: Cardiovascular Disease

## 2016-01-13 VITALS — BP 130/70 | Ht 75.0 in | Wt 379.0 lb

## 2016-01-13 DIAGNOSIS — R0689 Other abnormalities of breathing: Secondary | ICD-10-CM | POA: Diagnosis not present

## 2016-01-13 DIAGNOSIS — I509 Heart failure, unspecified: Secondary | ICD-10-CM | POA: Diagnosis not present

## 2016-01-13 DIAGNOSIS — R0600 Dyspnea, unspecified: Secondary | ICD-10-CM | POA: Diagnosis not present

## 2016-01-13 NOTE — Patient Instructions (Signed)
Your physician recommends that you schedule a follow-up appointment As Needed  Your physician recommends that you continue on your current medications as directed. Please refer to the Current Medication list given to you today.  Thank you for choosing [facility]!

## 2016-01-13 NOTE — Progress Notes (Signed)
Cardiology Office Note   Date:  01/13/2016   ID:  Jorge [name], DOB 1966/03/05, MRN [id number]  PCP:  Hilbert Corrigan, MD  Cardiologist:   Jenkins Rouge, MD   No chief complaint on file.     History of Present Illness: Jorge [name] is a 50 y.o. male who presents for evaluation of CHF. Reviewed recent admission from [facility] d/c on 12/19/15.  Had COPD exacerbation. Started smoking age 27 quit a month ago. Admitted with wheezing and cough CXR chronic changes. ? Of diastolic CHF but BNP only 124  Reviewed echo from [id number] Normal systolic and diastolic function with no significant valve dx or pulmonary HTN.  Since d/c still with dyspnea has seen Dr Luan Pulling. On oxygen All time now. Does not where his CPAP.  History of ETOH abuse with cirrhosis and LE edema Previous duplex exams With no DVT;s   Study Conclusions  - Left ventricle: The cavity size was at the upper limits of   normal. Wall thickness was increased in a pattern of mild LVH.   Systolic function was normal. The estimated ejection fraction was   in the range of 60% to 65%. Left ventricular diastolic function   parameters were normal. - Aortic valve: Poorly visualized. Mildly calcified annulus. Mean   gradient (S): 7 mm Hg. Valve area (VTI): 1.84 cm^2. Valve area   (Vmax): 1.91 cm^2. - Mitral valve: Calcified annulus. There was trivial regurgitation. - Tricuspid valve: There was trivial regurgitation. - Pulmonary arteries: Systolic pressure could not be accurately   estimated. - Pericardium, extracardiac: There was no pericardial effusion.  Impressions:  - Extremely limited images. LV chamber size upper normal with mild   LVH and LVEF 60-65% with the use of Definity contrast. Grossly   normal diastolic function. Mild aortic and mitral annular   calcification. Trivial mitral regurgitation and tricuspid   regurgitation.    Past Medical History:  Diagnosis Date  . Cirrhosis ([place]) sleep apnea  . COPD  (chronic obstructive pulmonary disease) ([place])   . Diabetes mellitus without complication ([place])   . Hypertension   . Renal disorder    stage II  . Renal insufficiency   . Sleep apnea   . Suicide attempt ([place])   . Syncope   . Tobacco abuse     Past Surgical History:  Procedure Laterality Date  . CHOLECYSTECTOMY    . LEG SURGERY Left      Current Outpatient Prescriptions  Medication Sig Dispense Refill  . ARIPiprazole (ABILIFY) 20 MG tablet Take 20 mg by mouth daily.    Marland Kitchen aspirin 81 MG tablet Take 81 mg by mouth daily.    Marland Kitchen atenolol (TENORMIN) 25 MG tablet Take 25 mg by mouth daily.     . calcium citrate (CALCITRATE - DOSED IN MG ELEMENTAL CALCIUM) 950 MG tablet Take 1 tablet by mouth daily.    . cholecalciferol (VITAMIN D) 1000 UNITS tablet Take 1,000 Units by mouth daily. Reported on 05/03/2015    . citalopram (CELEXA) 40 MG tablet Take 40 mg by mouth at bedtime.     Marland Kitchen Fe Bisgly-Succ-C-Thre-B12-FA (IRON-150 PO) Take 1 tablet by mouth daily.    . fluticasone furoate-vilanterol (BREO ELLIPTA) 100-25 MCG/INH AEPB Inhale 1 puff into the lungs daily.    Marland Kitchen gabapentin (NEURONTIN) 300 MG capsule Take 300 mg by mouth 3 (three) times daily.    Marland Kitchen guaiFENesin (MUCINEX) 600 MG 12 hr tablet Take 600 mg by mouth 2 (two) times daily.     Marland Kitchen  HYDROcodone-acetaminophen (NORCO/VICODIN) 5-325 MG tablet Take 1 tablet by mouth every 6 (six) hours.     . insulin aspart (NOVOLOG FLEXPEN) 100 UNIT/ML FlexPen Inject 2-10 Units into the skin 4 (four) times daily -  before meals and at bedtime. 150-200=2units Call np/md if less than 70 201-250=4units 251-300=6units 301-350=8units 351-400=10units Greater than 400 call np and md    . Insulin Glargine (LANTUS SOLOSTAR) 100 UNIT/ML Solostar Pen Inject 20 Units into the skin at bedtime.    Marland Kitchen ipratropium-albuterol (DUONEB) 0.5-2.5 (3) MG/3ML SOLN Take 3 mLs by nebulization 3 (three) times daily. 360 mL 3  . lactulose, encephalopathy, (CHRONULAC) 10 GM/15ML SOLN  Take 14.5 g by mouth 3 (three) times daily. 81mls 3 times daily    . Melatonin 5 MG CAPS Take 1 capsule by mouth at bedtime.    . OXYGEN Inhale 3 L into the lungs daily as needed (for breathing).     . polyethylene glycol (MIRALAX / GLYCOLAX) packet Take 17 g by mouth daily as needed for mild constipation or moderate constipation.     . predniSONE (DELTASONE) 10 MG tablet Take daily by mouth: 40 mg x3 days, then 20 mg x3 days, then 10 mg x3 days, then stop.    Marland Kitchen PROAIR HFA 108 (90 BASE) MCG/ACT inhaler Inhale 2 puffs into the lungs every 6 (six) hours as needed for wheezing or shortness of breath. Shortness of breath/wheeze    . rifaximin (XIFAXAN) 200 MG tablet Take 200 mg by mouth daily.    . sennosides-docusate sodium (SENOKOT-S) 8.6-50 MG tablet Take 1 tablet by mouth daily as needed for constipation.     . torsemide (DEMADEX) 20 MG tablet Take 40 mg by mouth 2 (two) times daily.     . traZODone (DESYREL) 50 MG tablet Take 150 mg by mouth at bedtime.     No current facility-administered medications for this visit.     Allergies:   Penicillins    Social History:  The patient  reports that he quit smoking about 2 months ago. His smoking use included Cigarettes. He smoked 0.50 packs per day. He has never used smokeless tobacco. He reports that he does not drink alcohol or use drugs.   Family History:  The patient's family history includes Cancer in his mother; Heart disease in his father.    ROS:  Please see the history of present illness.   Otherwise, review of systems are positive for none.   All other systems are reviewed and negative.    PHYSICAL EXAM: VS:  BP 130/70 (BP Location: Left Arm)   Ht 6\' 3"  (1.905 m)   Wt (!) 379 lb (171.9 kg)   SpO2 (!) 76% Comment: 3 L O2 24/7  BMI 47.37 kg/m  , BMI Body mass index is 47.37 kg/m. Affect appropriate Chronically ill obese white male  HEENT: normal Neck supple with no adenopathy JVP normal no bruits no thyromegaly Lungs clear with  no wheezing and good diaphragmatic motion Heart:  S1/S2 SEM  murmur, no rub, gallop or click PMI normal Abdomen: benighn, BS positve, no tenderness, no AAA no bruit.  No HSM or HJR Distal pulses intact with no bruits Plus 3 LLE edema plus 2 RLE edema with stasis changes Neuro non-focal  RUE tremor  Skin warm and dry No muscular weakness    EKG:  SR low voltage nonspecific ST changes    Recent Labs: 12/13/2015: ALT 54; B Natriuretic Peptide 124.0 12/15/2015: Hemoglobin 12.7; Platelets 140 12/16/2015: BUN 58;  Creatinine, Ser 1.54; Potassium 4.2; Sodium 134    Lipid Panel    Component Value Date/Time   CHOL 112 12/23/2011 0720   TRIG 85 12/23/2011 0720   HDL 33 (L) 12/23/2011 0720   VLDL 17 12/23/2011 0720   LDLCALC 62 12/23/2011 0720      Wt Readings from Last 3 Encounters:  01/13/16 (!) 379 lb (171.9 kg)  12/19/15 (!) 371 lb 9.6 oz (168.6 kg)  09/10/15 (!) 380 lb (172.4 kg)      Other studies Reviewed: Additional studies/ records that were reviewed today include: ER hospital notes labs ECG and echo .    ASSESSMENT AND PLAN:  1.  COPD:  Continue oxygen f/u Dr Luan Pulling continue to stop smoking 2. Volume Overload:  Not related to diastolic or systolic dysfunction echo normal BNP low Continue current diuretics related to cirrhosis and obesity 3. Edema:  Needs to elevate legs low sodium diet and wound care for ulcer on RLE shin Previous US with no DVT 4. Tremor no parkinson's ? From previous ETOH abuse f/u primary    Current medicines are reviewed at length with the patient today.  The patient does not have concerns regarding medicines.  The following changes have been made:  no change  Labs/ tests ordered today include: None No orders of the defined types were placed in this encounter.    Disposition:   FU with [place] PRN      Signed, Jenkins Rouge, MD  01/13/2016 8:38 AM    [facility] [address], [place], [place]  [address] Phone:  [phone]; Fax: [phone]

## 2016-01-19 DIAGNOSIS — R600 Localized edema: Secondary | ICD-10-CM | POA: Diagnosis not present

## 2016-01-19 DIAGNOSIS — M79669 Pain in unspecified lower leg: Secondary | ICD-10-CM | POA: Diagnosis not present

## 2016-01-19 DIAGNOSIS — F17290 Nicotine dependence, other tobacco product, uncomplicated: Secondary | ICD-10-CM | POA: Diagnosis not present

## 2016-01-19 DIAGNOSIS — E1165 Type 2 diabetes mellitus with hyperglycemia: Secondary | ICD-10-CM | POA: Diagnosis not present

## 2016-02-01 DIAGNOSIS — L84 Corns and callosities: Secondary | ICD-10-CM | POA: Diagnosis not present

## 2016-02-01 DIAGNOSIS — E1051 Type 1 diabetes mellitus with diabetic peripheral angiopathy without gangrene: Secondary | ICD-10-CM | POA: Diagnosis not present

## 2016-02-04 DIAGNOSIS — F319 Bipolar disorder, unspecified: Secondary | ICD-10-CM | POA: Diagnosis not present

## 2016-02-04 DIAGNOSIS — F411 Generalized anxiety disorder: Secondary | ICD-10-CM | POA: Diagnosis not present

## 2016-02-04 DIAGNOSIS — F331 Major depressive disorder, recurrent, moderate: Secondary | ICD-10-CM | POA: Diagnosis not present

## 2016-02-07 DIAGNOSIS — J9611 Chronic respiratory failure with hypoxia: Secondary | ICD-10-CM | POA: Diagnosis not present

## 2016-02-07 DIAGNOSIS — J449 Chronic obstructive pulmonary disease, unspecified: Secondary | ICD-10-CM | POA: Diagnosis not present

## 2016-02-07 DIAGNOSIS — G4733 Obstructive sleep apnea (adult) (pediatric): Secondary | ICD-10-CM | POA: Diagnosis not present

## 2016-02-07 DIAGNOSIS — I10 Essential (primary) hypertension: Secondary | ICD-10-CM | POA: Diagnosis not present

## 2016-04-03 ENCOUNTER — Telehealth: Payer: Self-pay | Admitting: Nurse Practitioner

## 2016-06-11 IMAGING — CR DG CHEST 1V PORT
1 series · 1 of 1 positions shown · non-contrast
Comparison: Prior chest x-ray 12/07/2014

CLINICAL DATA: 48-year-old male with weakness and history of recent
fall a few weeks ago.

EXAM:
PORTABLE CHEST - 1 VIEW

[ap portable]
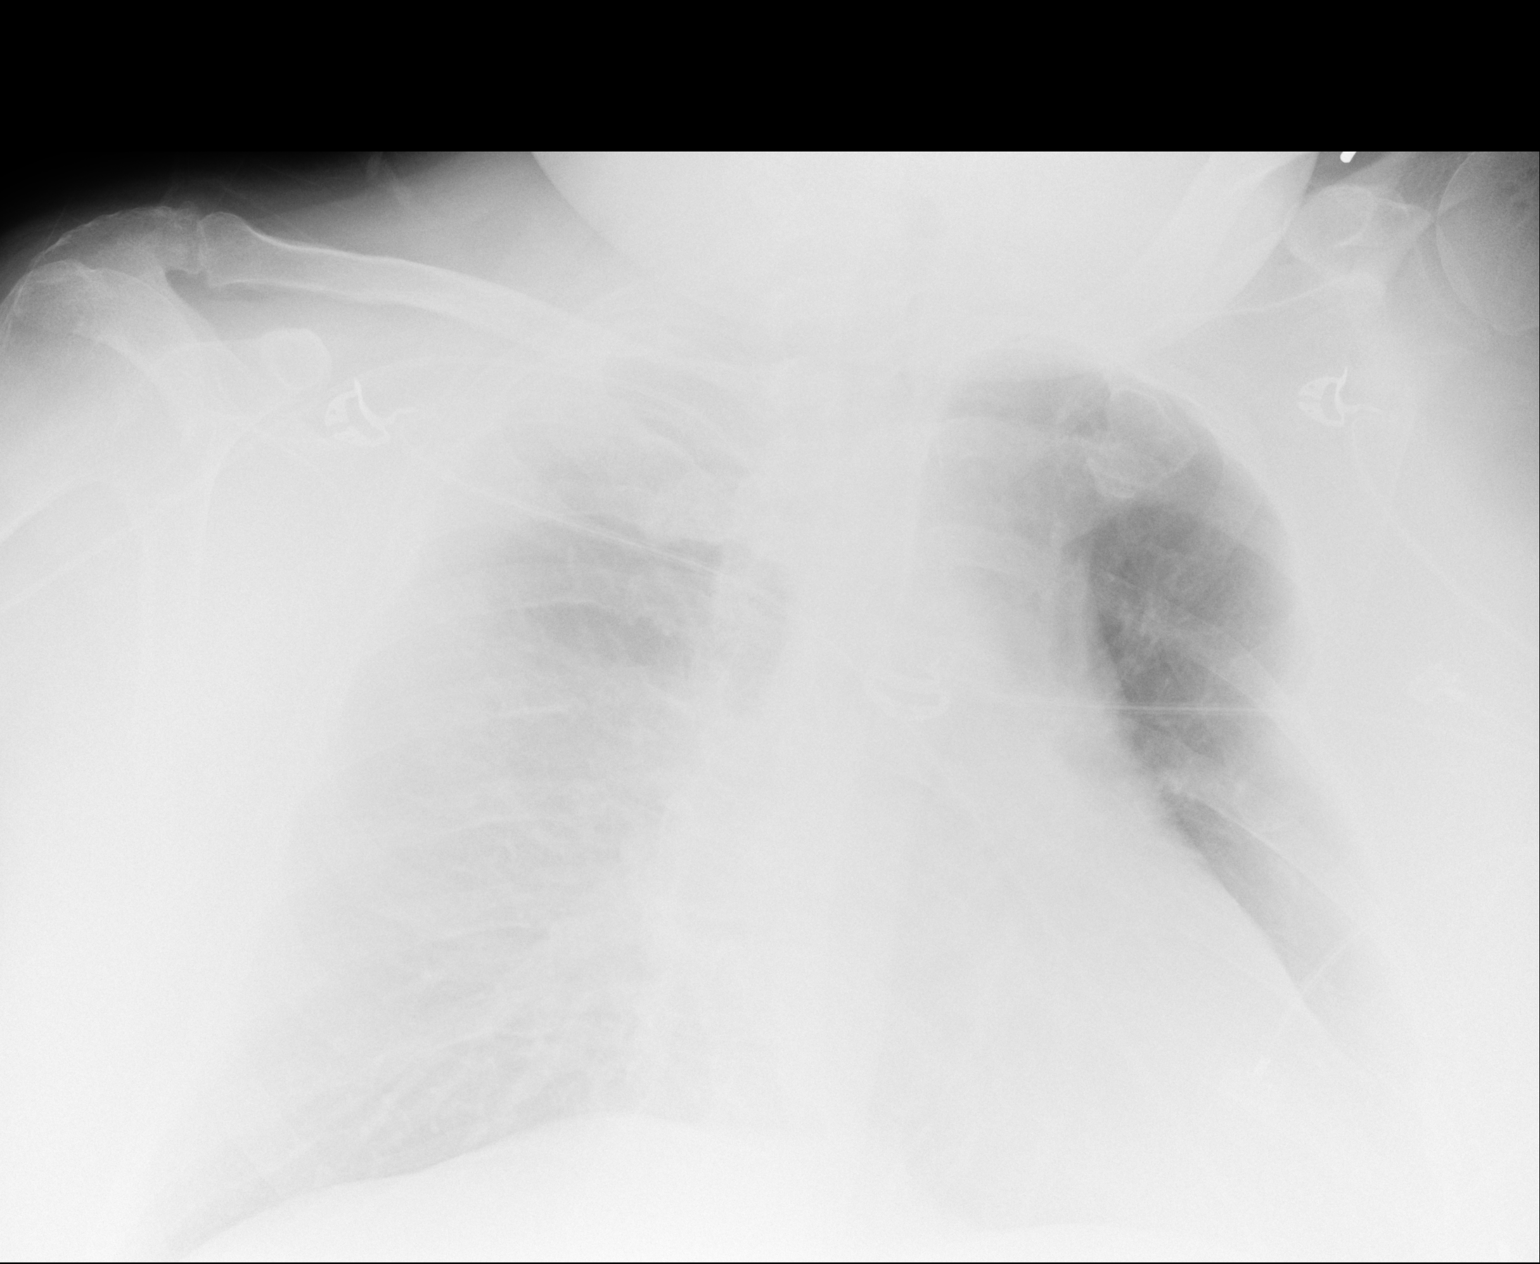

[1 of 1 positions shown; findings below may reference images not displayed]

FINDINGS: Stable cardiomegaly. Increased pulmonary vascular congestion now
bordering on mild interstitial edema. No large pleural effusion or
evidence of pneumothorax. The patient is rotated to the left.
Examination is slightly limited by underpenetration. No acute
osseous abnormality.
IMPRESSION: Cardiomegaly with pulmonary vascular congestion bordering on mild
interstitial edema. Findings suggest early/mild CHF.

## 2016-06-11 IMAGING — CR DG TIBIA/FIBULA 2V*L*
1 series · 3 of 3 positions shown · non-contrast
Comparison: 12/07/2014.

CLINICAL DATA: 48-year-old male with recent history of fall with
multiple fractures in the left leg status post ORIF. Weakness.

EXAM:
LEFT TIBIA AND FIBULA - 2 VIEW

[Series 1: ap · 0.17mm/px · 3 of 3 slices shown]
[im 1/3]
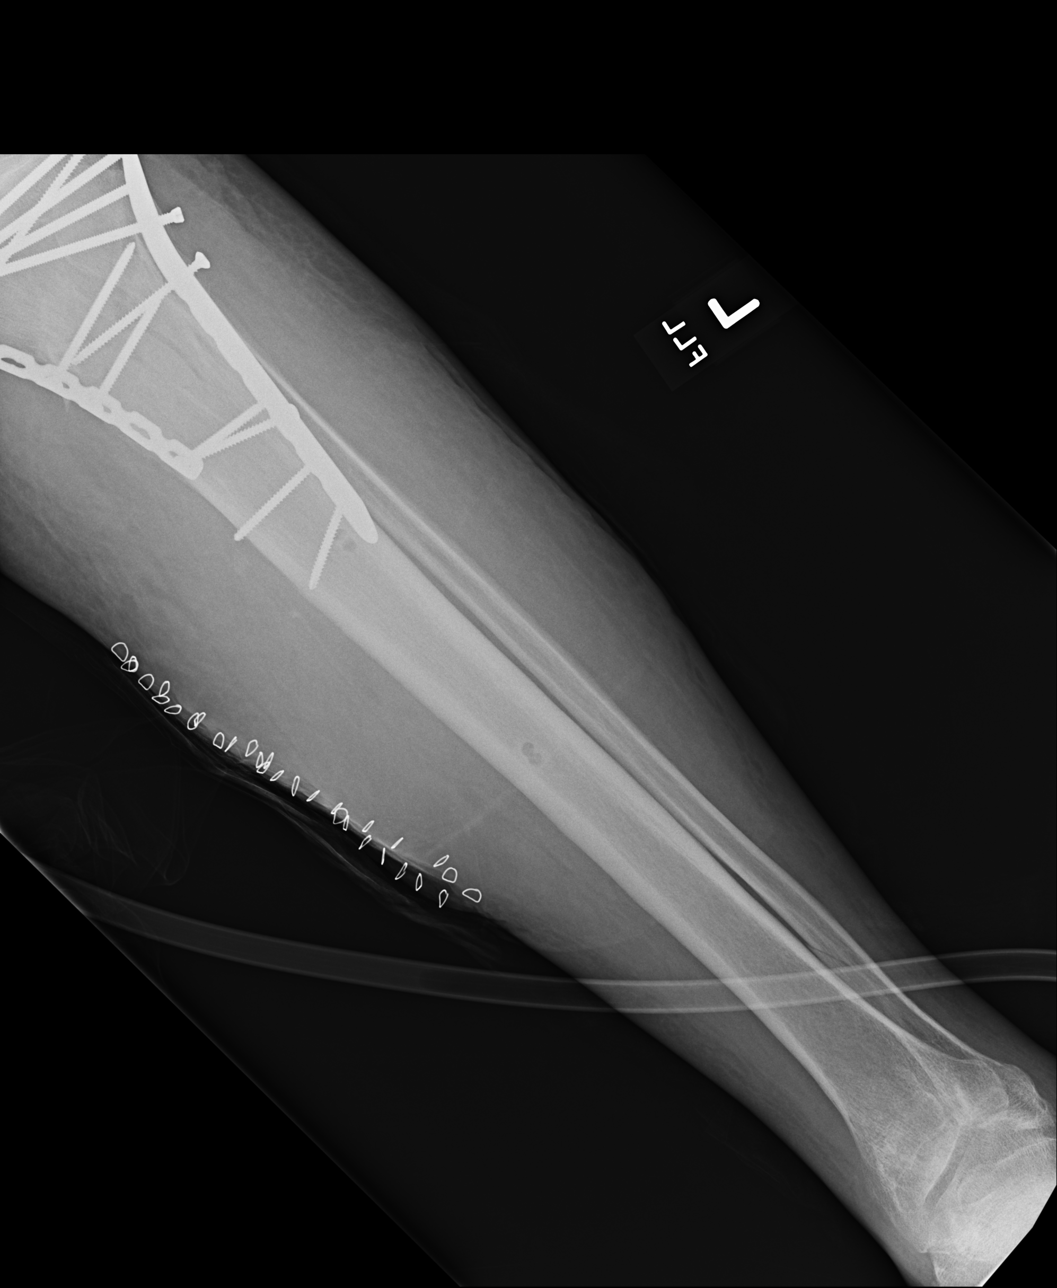
[im 2/3]
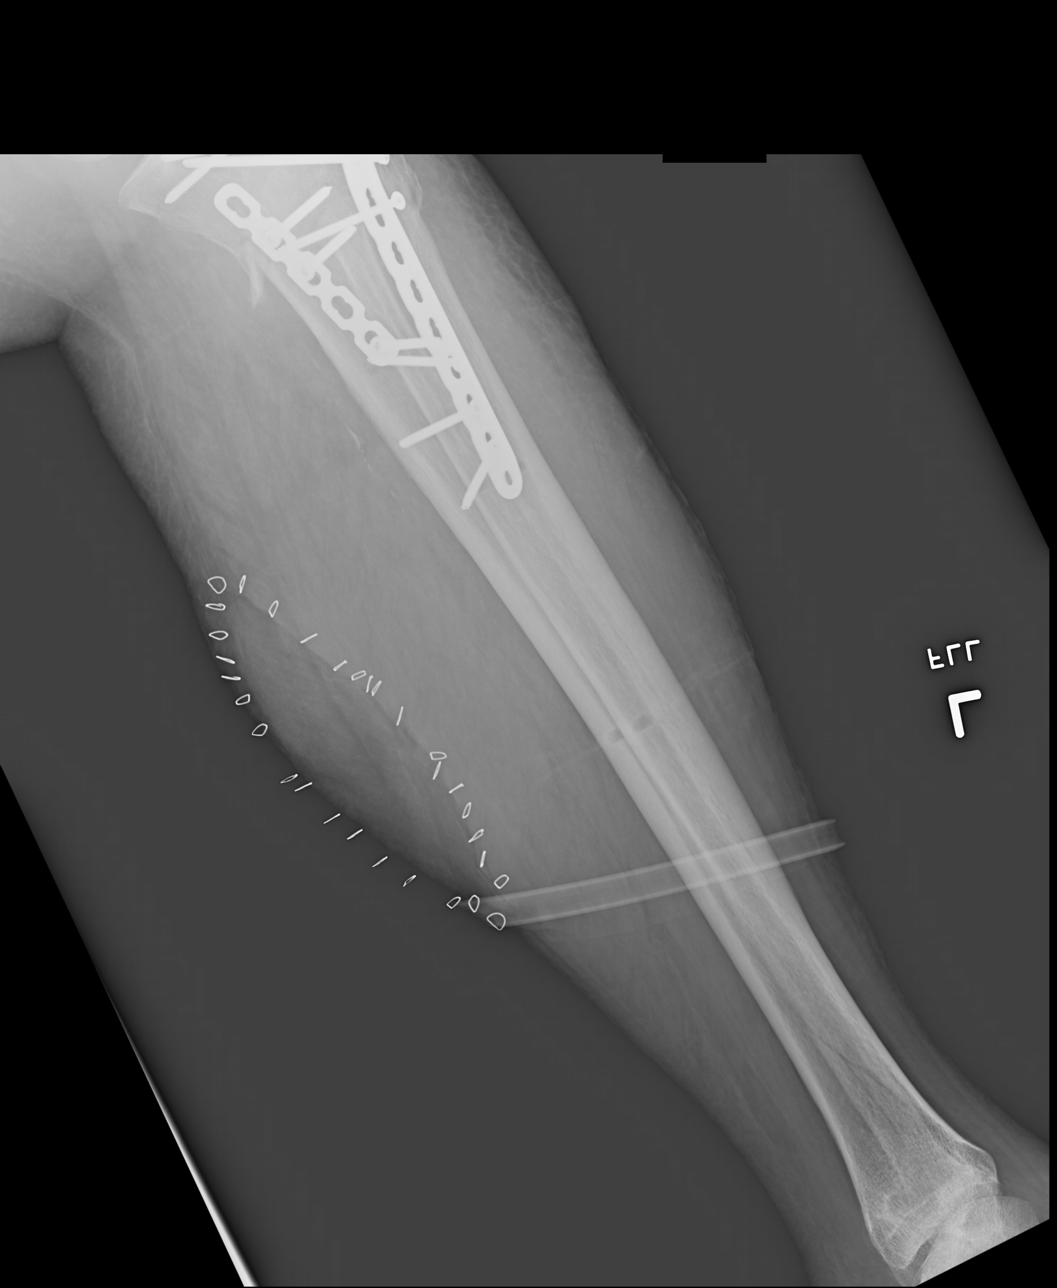
[im 3/3]
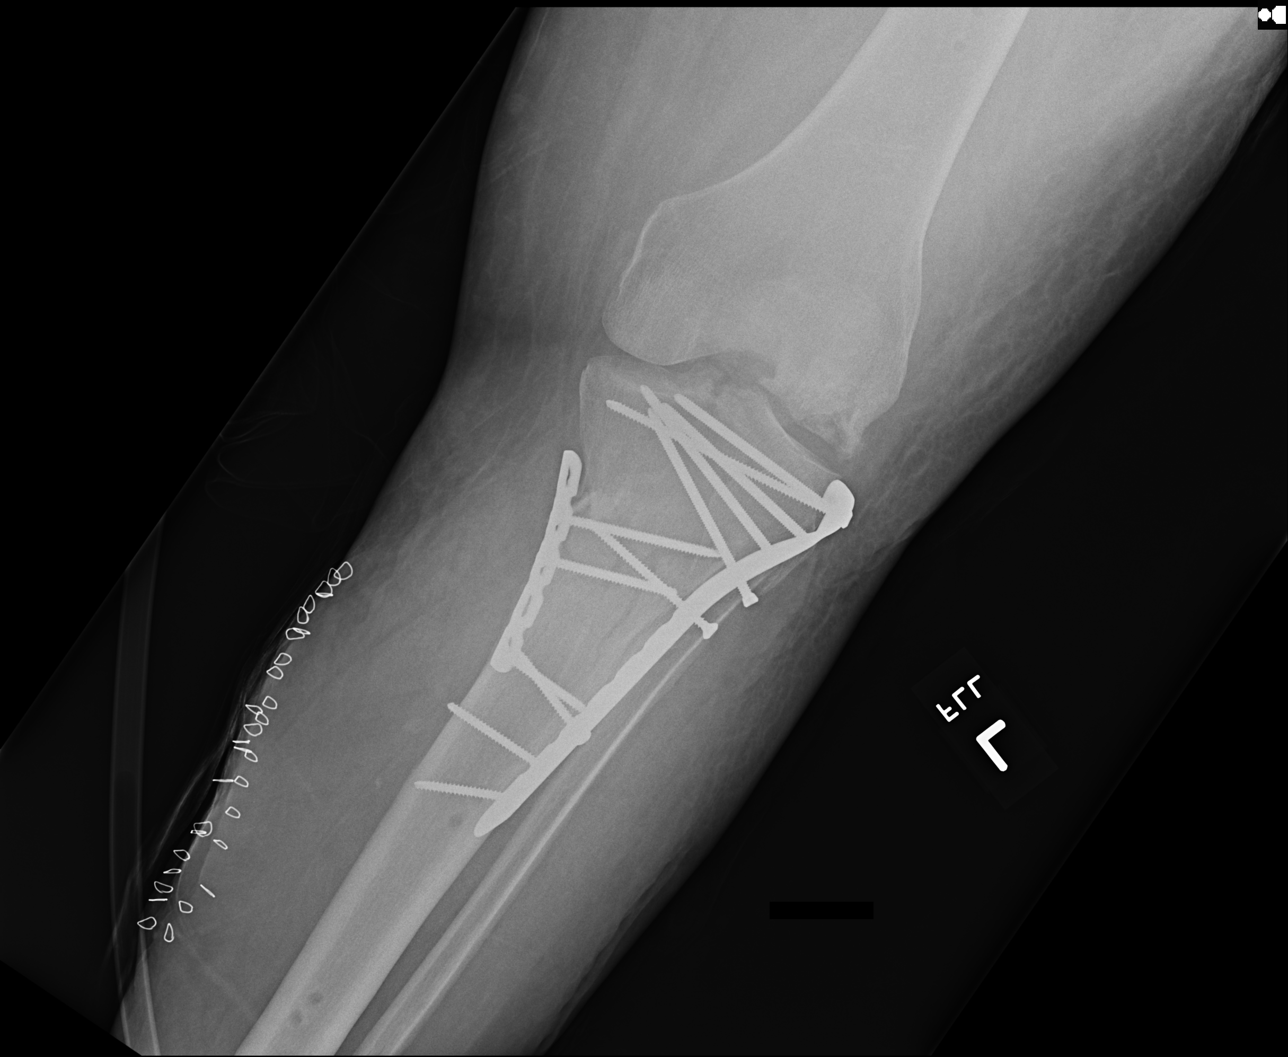

[3 of 3 positions shown; findings below may reference images not displayed]

FINDINGS: Again noted is a comminuted fracture of the proximal tibia
predominantly involving the metadiaphysis, but with intra-articular
extension through the tibial spine. Proximal fibular fracture also
noted, but poorly visualized on today's radiographs. Osteochondral
injury in the lateral femoral condyle again noted. Compared to the
prior study there has been interval ORIF of the proximal tibial
fractures, with medial and lateral plate and screw fixation devices
in position. Multiple skin staples are noted along the medial aspect
of the calf. Soft tissues appear diffusely swollen.
IMPRESSION: 1. Multiple fractures of the left lower extremity redemonstrated,
with postoperative changes of ORIF in the left proximal tibia, as
above. No acute complicating features.

## 2016-06-14 ENCOUNTER — Emergency Department (HOSPITAL_COMMUNITY)
Admission: EM | Admit: 2016-06-14 | Discharge: 2016-06-15 | Disposition: A | Discharge status: Home / Self Care | Payer: Medicare Other | Attending: Emergency Medicine | Admitting: Emergency Medicine

## 2016-06-14 ENCOUNTER — Emergency Department (HOSPITAL_COMMUNITY): Payer: Medicare Other

## 2016-06-14 ENCOUNTER — Encounter (HOSPITAL_COMMUNITY): Payer: Self-pay

## 2016-06-14 DIAGNOSIS — E1122 Type 2 diabetes mellitus with diabetic chronic kidney disease: Secondary | ICD-10-CM | POA: Diagnosis not present

## 2016-06-14 DIAGNOSIS — R079 Chest pain, unspecified: Secondary | ICD-10-CM | POA: Insufficient documentation

## 2016-06-14 DIAGNOSIS — Z87891 Personal history of nicotine dependence: Secondary | ICD-10-CM | POA: Diagnosis not present

## 2016-06-14 DIAGNOSIS — Z794 Long term (current) use of insulin: Secondary | ICD-10-CM | POA: Insufficient documentation

## 2016-06-14 DIAGNOSIS — Z79899 Other long term (current) drug therapy: Secondary | ICD-10-CM | POA: Insufficient documentation

## 2016-06-14 DIAGNOSIS — Z7982 Long term (current) use of aspirin: Secondary | ICD-10-CM | POA: Diagnosis not present

## 2016-06-14 DIAGNOSIS — I129 Hypertensive chronic kidney disease with stage 1 through stage 4 chronic kidney disease, or unspecified chronic kidney disease: Secondary | ICD-10-CM | POA: Diagnosis not present

## 2016-06-14 DIAGNOSIS — J441 Chronic obstructive pulmonary disease with (acute) exacerbation: Secondary | ICD-10-CM | POA: Diagnosis not present

## 2016-06-14 DIAGNOSIS — N189 Chronic kidney disease, unspecified: Secondary | ICD-10-CM | POA: Diagnosis not present

## 2016-06-14 LAB — CBC WITH DIFFERENTIAL/PLATELET
BASOS PCT: 0 %
Basophils Absolute: 0 10*3/uL (ref 0.0–0.1)
EOS ABS: 0 10*3/uL (ref 0.0–0.7)
EOS PCT: 1 %
HEMATOCRIT: 36.6 % — AB (ref 39.0–52.0)
Hemoglobin: 11.8 g/dL — ABNORMAL LOW (ref 13.0–17.0)
Lymphocytes Relative: 9 %
Lymphs Abs: 0.5 10*3/uL — ABNORMAL LOW (ref 0.7–4.0)
MCH: 34.8 pg — AB (ref 26.0–34.0)
MCHC: 32.2 g/dL (ref 30.0–36.0)
MCV: 108 fL — AB (ref 78.0–100.0)
MONO ABS: 0.3 10*3/uL (ref 0.1–1.0)
MONOS PCT: 6 %
NEUTROS PCT: 84 %
Neutro Abs: 5 10*3/uL (ref 1.7–7.7)
PLATELETS: 97 10*3/uL — AB (ref 150–400)
RBC: 3.39 MIL/uL — ABNORMAL LOW (ref 4.22–5.81)
RDW: 14.7 % (ref 11.5–15.5)
Smear Review: ADEQUATE
WBC: 6 10*3/uL (ref 4.0–10.5)

## 2016-06-14 LAB — COMPREHENSIVE METABOLIC PANEL
ALT: 27 U/L (ref 17–63)
ANION GAP: 8 (ref 5–15)
AST: 23 U/L (ref 15–41)
Albumin: 3.4 g/dL — ABNORMAL LOW (ref 3.5–5.0)
Alkaline Phosphatase: 79 U/L (ref 38–126)
BILIRUBIN TOTAL: 0.7 mg/dL (ref 0.3–1.2)
BUN: 57 mg/dL — AB (ref 6–20)
CHLORIDE: 85 mmol/L — AB (ref 101–111)
CO2: 43 mmol/L — ABNORMAL HIGH (ref 22–32)
Calcium: 8.5 mg/dL — ABNORMAL LOW (ref 8.9–10.3)
Creatinine, Ser: 2.22 mg/dL — ABNORMAL HIGH (ref 0.61–1.24)
GFR, EST AFRICAN AMERICAN: 37 mL/min — AB (ref >60)
GFR, EST NON AFRICAN AMERICAN: 32 mL/min — AB (ref >60)
Glucose, Bld: 338 mg/dL — ABNORMAL HIGH (ref 65–99)
POTASSIUM: 3.9 mmol/L (ref 3.5–5.1)
Sodium: 136 mmol/L (ref 135–145)
TOTAL PROTEIN: 7.6 g/dL (ref 6.5–8.1)

## 2016-06-14 LAB — I-STAT TROPONIN, ED: TROPONIN I, POC: 0.02 ng/mL (ref 0.00–0.08)

## 2016-06-14 NOTE — ED Triage Notes (Signed)
Chest pain x 3-4 days, given ntg at [facility] without relief.  Pt also given antacids without relief.

## 2016-06-14 NOTE — ED Provider Notes (Signed)
[facility] DEPT Provider Note   CSN: [id number] Arrival date & time: 06/14/16  2033  By signing my name below, I, Jeanell Sparrow, attest that this documentation has been prepared under the direction and in the presence of Nat Christen, MD. Electronically Signed: Jeanell Sparrow, Scribe. 06/14/2016. 9:13 PM.  History   Chief Complaint Chief Complaint  Patient presents with  . Chest Pain   The history is provided by the patient. No language interpreter was used.   HPI Comments: Jorge [name] is a 51 y.o. male who presents to the Emergency Department complaining of constant moderate left-sided that started about 3 days ago. Per nurse's note, he was given nitroglycerin at [facility] home PTA without relief. He describes the pain as sharp. No new dyspnea. He admits to current osteonecrosis. He denies any diaphoresis, vomiting, or other complaints.     PCP: Carlynn Herald, MD   Past Medical History:  Diagnosis Date  . Cirrhosis ([place]) sleep apnea  . COPD (chronic obstructive pulmonary disease) ([place])   . Diabetes mellitus without complication ([place])   . Hypertension   . Renal disorder    stage II  . Renal insufficiency   . Sleep apnea   . Suicide attempt   . Syncope   . Tobacco abuse     Patient Active Problem List   Diagnosis Date Noted  . DNR (do not resuscitate) 12/13/2015  . Diabetes mellitus ([place]) 12/13/2015  . Pressure ulcer 12/23/2014  . Syncope 12/23/2014  . Anemia 12/21/2014  . Thrombocytopenia ([place]) 10/27/2014  . Hyperglycemia 10/27/2014  . COPD with exacerbation ([place]) 10/27/2014  . Acute on chronic respiratory failure with hypoxia ([place]) 10/27/2014  . Sleep apnea   . Tobacco abuse   . Insomnia 10/03/2014  . Chronic pain 10/03/2014  . GERD without esophagitis 10/03/2014  . Chronic obstructive pulmonary disease with acute exacerbation ([place])   . Renal insufficiency   . Pain in the chest   . Essential hypertension   . COPD exacerbation ([place]) 08/29/2014    . Chest pain 08/29/2014  . CKD (chronic kidney disease) stage 3, GFR 30-59 ml/min 08/29/2014  . Acute respiratory failure with hypoxia ([place]) 08/29/2014  . Cirrhosis ([place])   . Hypertension     Past Surgical History:  Procedure Laterality Date  . CHOLECYSTECTOMY    . LEG SURGERY Left        Home Medications    Prior to Admission medications   Medication Sig Start Date End Date Taking? Authorizing Provider  ARIPiprazole (ABILIFY) 20 MG tablet Take 20 mg by mouth daily.    Historical Provider, MD  aspirin 81 MG tablet Take 81 mg by mouth daily.    Historical Provider, MD  atenolol (TENORMIN) 25 MG tablet Take 25 mg by mouth daily.     Historical Provider, MD  calcium citrate (CALCITRATE - DOSED IN MG ELEMENTAL CALCIUM) 950 MG tablet Take 1 tablet by mouth daily.    Historical Provider, MD  cholecalciferol (VITAMIN D) 1000 UNITS tablet Take 1,000 Units by mouth daily. Reported on 05/03/2015    Historical Provider, MD  citalopram (CELEXA) 40 MG tablet Take 40 mg by mouth at bedtime.  12/03/13   Historical Provider, MD  Fe Bisgly-Succ-C-Thre-B12-FA (IRON-150 PO) Take 1 tablet by mouth daily.    Historical Provider, MD  fluticasone furoate-vilanterol (BREO ELLIPTA) 100-25 MCG/INH AEPB Inhale 1 puff into the lungs daily.    Historical Provider, MD  gabapentin (NEURONTIN) 300 MG capsule Take 300 mg by mouth  3 (three) times daily. 12/03/13   Historical Provider, MD  guaiFENesin (MUCINEX) 600 MG 12 hr tablet Take 600 mg by mouth 2 (two) times daily.     Historical Provider, MD  HYDROcodone-acetaminophen (NORCO/VICODIN) 5-325 MG tablet Take 1 tablet by mouth every 6 (six) hours.     Historical Provider, MD  insulin aspart (NOVOLOG FLEXPEN) 100 UNIT/ML FlexPen Inject 2-10 Units into the skin 4 (four) times daily -  before meals and at bedtime. 150-200=2units Call np/md if less than 70 201-250=4units 251-300=6units 301-350=8units 351-400=10units Greater than 400 call np and md    Historical  Provider, MD  Insulin Glargine (LANTUS SOLOSTAR) 100 UNIT/ML Solostar Pen Inject 20 Units into the skin at bedtime.    Historical Provider, MD  ipratropium-albuterol (DUONEB) 0.5-2.5 (3) MG/3ML SOLN Take 3 mLs by nebulization 3 (three) times daily. 11/01/14   Rosita Fire, MD  lactulose, encephalopathy, (CHRONULAC) 10 GM/15ML SOLN Take 14.5 g by mouth 3 (three) times daily. 75mls 3 times daily    Historical Provider, MD  Melatonin 5 MG CAPS Take 1 capsule by mouth at bedtime.    Historical Provider, MD  OXYGEN Inhale 3 L into the lungs daily as needed (for breathing).     Historical Provider, MD  polyethylene glycol (MIRALAX / GLYCOLAX) packet Take 17 g by mouth daily as needed for mild constipation or moderate constipation.     Historical Provider, MD  predniSONE (DELTASONE) 10 MG tablet Take daily by mouth: 40 mg x3 days, then 20 mg x3 days, then 10 mg x3 days, then stop. 12/19/15   Samuella Cota, MD  PROAIR HFA 108 [phone] BASE) MCG/ACT inhaler Inhale 2 puffs into the lungs every 6 (six) hours as needed for wheezing or shortness of breath. Shortness of breath/wheeze 12/05/13   Historical Provider, MD  rifaximin (XIFAXAN) 200 MG tablet Take 200 mg by mouth daily.    Historical Provider, MD  sennosides-docusate sodium (SENOKOT-S) 8.6-50 MG tablet Take 1 tablet by mouth daily as needed for constipation.     Historical Provider, MD  torsemide (DEMADEX) 20 MG tablet Take 40 mg by mouth 2 (two) times daily.     Historical Provider, MD  traZODone (DESYREL) 50 MG tablet Take 150 mg by mouth at bedtime.    Historical Provider, MD    Family History Family History  Problem Relation Age of Onset  . Heart disease Father   . Cancer Mother     breast    Social History Social History  Substance Use Topics  . Smoking status: Former Smoker    Packs/day: 0.50    Types: Cigarettes    Quit date: 11/12/2015  . Smokeless tobacco: Never Used     Comment: slowing down  . Alcohol use No     Allergies     Penicillins   Review of Systems Review of Systems A complete 10 system review of systems was obtained and all systems are negative except as noted in the HPI and PMH.   Physical Exam Updated Vital Signs BP 129/59 (BP Location: Right Arm)   Pulse 64   Temp 98.6 F (37 C) (Oral)   Resp 24   Ht 6\' 3"  (1.905 m)   Wt (!) 385 lb (174.6 kg)   SpO2 94%   BMI 48.12 kg/m   Physical Exam  Constitutional: He is oriented to person, place, and time. He appears well-developed and well-nourished.  Morbidly obese.   HENT:  Head: Normocephalic and atraumatic.  Eyes: Conjunctivae are  normal.  Neck: Neck supple.  Cardiovascular: Normal rate and regular rhythm.   Pulmonary/Chest: Effort normal and breath sounds normal.  Abdominal: Soft. Bowel sounds are normal.  Musculoskeletal: Normal range of motion.  Neurological: He is alert and oriented to person, place, and time.  Skin: Skin is warm and dry.  Venus stasis ulcers in BLE.   Psychiatric: He has a normal mood and affect. His behavior is normal.  Nursing note and vitals reviewed.    ED Treatments / Results  DIAGNOSTIC STUDIES: Oxygen Saturation is 94% on RA, normal by my interpretation.    COORDINATION OF CARE: 9:17 PM- Pt advised of plan for treatment, which includes standard cardiac workup, and pt agrees.  Labs (all labs ordered are listed, but only abnormal results are displayed) Labs Reviewed  CBC WITH DIFFERENTIAL/PLATELET - Abnormal; Notable for the following:       Result Value   RBC 3.39 (*)    Hemoglobin 11.8 (*)    HCT 36.6 (*)    MCV 108.0 (*)    MCH 34.8 (*)    Platelets 97 (*)    Lymphs Abs 0.5 (*)    All other components within normal limits  COMPREHENSIVE METABOLIC PANEL - Abnormal; Notable for the following:    Chloride 85 (*)    CO2 43 (*)    Glucose, Bld 338 (*)    BUN 57 (*)    Creatinine, Ser 2.22 (*)    Calcium 8.5 (*)    Albumin 3.4 (*)    GFR calc non Af Amer 32 (*)    GFR calc Af Amer 37 (*)     All other components within normal limits  I-STAT TROPOININ, ED    EKG  EKG Interpretation  Date/Time:  Wednesday June 14 2016 20:39:05 EST Ventricular Rate:  66 PR Interval:    QRS Duration: 106 QT Interval:  432 QTC Calculation: 453 R Axis:   27 Text Interpretation:  Sinus rhythm Consider left atrial enlargement Anteroseptal infarct, old Baseline wander in lead(s) V1 Confirmed by Evanna Washinton  MD, Mollyann Halbert ([id number]) on 06/14/2016 9:29:49 PM       Radiology Dg Chest Portable 1 View  Result Date: 06/14/2016 CLINICAL DATA:  Left-sided chest pain and dyspnea for 3 days. EXAM: PORTABLE CHEST 1 VIEW COMPARISON:  12/13/2015 FINDINGS: Unchanged mild cardiomegaly unchanged mild vascular and interstitial prominence. No confluent airspace consolidation. No large effusions. IMPRESSION: Cardiomegaly and mild vascular/ interstitial prominence, unchanged. Electronically Signed   By: Andreas Newport M.D.   On: 06/14/2016 21:22    Procedures Procedures (including critical care time)  Medications Ordered in ED Medications - No data to display   Initial Impression / Assessment and Plan / ED Course  I have reviewed the triage vital signs and the nursing notes.  Pertinent labs & imaging results that were available during my care of the patient were reviewed by me and considered in my medical decision making (see chart for details).     Patient is in poor health. EKG, chest x-ray, troponin showed no acute changes. Will discharge to nursing home.  Final Clinical Impressions(s) / ED Diagnoses   Final diagnoses:  Chest pain, unspecified type    New Prescriptions New Prescriptions   No medications on file   I personally performed the services described in this documentation, which was scribed in my presence. The recorded information has been reviewed and is accurate.      Nat Christen, MD 06/14/16 [phone]

## 2016-06-14 NOTE — Discharge Instructions (Signed)
Tests show no life-threatening condition. Follow-up with primary care doctor. Can take nitroglycerin for chest pain.

## 2016-06-14 NOTE — ED Notes (Signed)
Report given by Flint Melter, NP from [facility]. Pt C/O of left sided chest pain, pt given 3 nitro with no relief. Pt refused Maalox and Prilosec. Pt also refused breathing TX and chest X-ray at the facility. Pt adamant about coming to the hospital.

## 2016-06-15 ENCOUNTER — Emergency Department (HOSPITAL_COMMUNITY)
Admission: EM | Admit: 2016-06-15 | Discharge: 2016-06-15 | Disposition: A | Discharge status: Home / Self Care | Payer: Medicare Other | Source: Home / Self Care | Attending: Emergency Medicine | Admitting: Emergency Medicine

## 2016-06-15 ENCOUNTER — Encounter (HOSPITAL_COMMUNITY): Payer: Self-pay | Admitting: *Deleted

## 2016-06-15 DIAGNOSIS — R0789 Other chest pain: Secondary | ICD-10-CM | POA: Insufficient documentation

## 2016-06-15 DIAGNOSIS — Z7982 Long term (current) use of aspirin: Secondary | ICD-10-CM

## 2016-06-15 DIAGNOSIS — N183 Chronic kidney disease, stage 3 (moderate): Secondary | ICD-10-CM

## 2016-06-15 DIAGNOSIS — E1122 Type 2 diabetes mellitus with diabetic chronic kidney disease: Secondary | ICD-10-CM | POA: Insufficient documentation

## 2016-06-15 DIAGNOSIS — Z794 Long term (current) use of insulin: Secondary | ICD-10-CM

## 2016-06-15 DIAGNOSIS — I129 Hypertensive chronic kidney disease with stage 1 through stage 4 chronic kidney disease, or unspecified chronic kidney disease: Secondary | ICD-10-CM | POA: Insufficient documentation

## 2016-06-15 DIAGNOSIS — Z87891 Personal history of nicotine dependence: Secondary | ICD-10-CM

## 2016-06-15 DIAGNOSIS — J449 Chronic obstructive pulmonary disease, unspecified: Secondary | ICD-10-CM

## 2016-06-15 DIAGNOSIS — R079 Chest pain, unspecified: Secondary | ICD-10-CM | POA: Diagnosis not present

## 2016-06-15 LAB — CBG MONITORING, ED: Glucose-Capillary: 252 mg/dL — ABNORMAL HIGH (ref 65–99)

## 2016-06-15 MED ORDER — CYCLOBENZAPRINE HCL 10 MG PO TABS: 10.0000 mg | ORAL_TABLET | Freq: Three times a day (TID) | ORAL | 0 refills | Status: AC | PRN

## 2016-06-15 MED ORDER — CYCLOBENZAPRINE HCL 10 MG PO TABS
10.0000 mg | ORAL_TABLET | Freq: Once | ORAL | Status: AC
  Administered 2016-06-15: 10 mg via ORAL
  Filled 2016-06-15: qty 1

## 2016-06-15 MED ORDER — PREDNISONE 20 MG PO TABS
40.0000 mg | ORAL_TABLET | Freq: Once | ORAL | Status: AC
  Administered 2016-06-15: 40 mg via ORAL
  Filled 2016-06-15: qty 2

## 2016-06-15 MED ORDER — PREDNISONE 20 MG PO TABS
ORAL_TABLET | ORAL | 0 refills | Status: DC
Start: 2016-06-16 — End: 2016-09-20

## 2016-06-15 NOTE — ED Provider Notes (Addendum)
[facility] DEPT Provider Note   CSN: [id number] Arrival date & time: 06/15/16  0318  Time seen 03:45 AM   History   Chief Complaint Chief Complaint  Patient presents with  . Chest Pain    HPI Jorge [name] is a 51 y.o. male.  HPI  patient states he started having chest pain 3 days ago. He states the pain is constant and describes it as sharp. He states any type of movement, coughing, deep breathing or touching the area makes it hurt more. Laying on his left side makes it feel better. He states he has some mild shortness of breath and he has had some wheezing which she's had in the past and he are to uses a nebulizer 4. He states he's had a mild cough is nonproductive. He denies fever or chills. He denies any known injury or falls. He states he's never had this pain before. Patient was seen in the ED earlier this evening for the same pain and was just discharged a couple hours ago. He was given hydrocodone for pain in the ED which didn't help and also at his nursing facility.  FH FOP had MI/CABG  Patient has been an [place] [facility] nursing facility for the past year and a half. Patient states he broke his leg after which his knees got weak and he could no longer walk. He states he now uses a wheelchair and he can use a walker for short distances.  PCP Dr. Mal Amabile  Patient is DO NOT RESUSCITATE  Past Medical History:  Diagnosis Date  . Cirrhosis ([place]) sleep apnea  . COPD (chronic obstructive pulmonary disease) ([place])   . Diabetes mellitus without complication ([place])   . Hypertension   . Renal disorder    stage II  . Renal insufficiency   . Sleep apnea   . Suicide attempt   . Syncope   . Tobacco abuse     Patient Active Problem List   Diagnosis Date Noted  . DNR (do not resuscitate) 12/13/2015  . Diabetes mellitus ([place]) 12/13/2015  . Pressure ulcer 12/23/2014  . Syncope 12/23/2014  . Anemia 12/21/2014  . Thrombocytopenia ([place]) 10/27/2014  . Hyperglycemia 10/27/2014  .  COPD with exacerbation ([place]) 10/27/2014  . Acute on chronic respiratory failure with hypoxia ([place]) 10/27/2014  . Sleep apnea   . Tobacco abuse   . Insomnia 10/03/2014  . Chronic pain 10/03/2014  . GERD without esophagitis 10/03/2014  . Chronic obstructive pulmonary disease with acute exacerbation ([place])   . Renal insufficiency   . Pain in the chest   . Essential hypertension   . COPD exacerbation ([place]) 08/29/2014  . Chest pain 08/29/2014  . CKD (chronic kidney disease) stage 3, GFR 30-59 ml/min 08/29/2014  . Acute respiratory failure with hypoxia ([place]) 08/29/2014  . Cirrhosis ([place])   . Hypertension     Past Surgical History:  Procedure Laterality Date  . CHOLECYSTECTOMY    . LEG SURGERY Left        Home Medications    Prior to Admission medications   Medication Sig Start Date End Date Taking? Authorizing Provider  ARIPiprazole (ABILIFY) 20 MG tablet Take 20 mg by mouth daily.    Historical Provider, MD  aspirin 81 MG tablet Take 81 mg by mouth daily.    Historical Provider, MD  atenolol (TENORMIN) 25 MG tablet Take 25 mg by mouth daily.     Historical Provider, MD  calcium citrate (CALCITRATE - DOSED IN MG ELEMENTAL CALCIUM) 950  MG tablet Take 1 tablet by mouth daily.    Historical Provider, MD  cholecalciferol (VITAMIN D) 1000 UNITS tablet Take 1,000 Units by mouth daily. Reported on 05/03/2015    Historical Provider, MD  citalopram (CELEXA) 40 MG tablet Take 40 mg by mouth at bedtime.  12/03/13   Historical Provider, MD  cyclobenzaprine (FLEXERIL) 10 MG tablet Take 1 tablet (10 mg total) by mouth 3 (three) times daily as needed (muscle soreness). 06/15/16   Rolland Porter, MD  Fe Bisgly-Succ-C-Thre-B12-FA (IRON-150 PO) Take 1 tablet by mouth daily.    Historical Provider, MD  fluticasone furoate-vilanterol (BREO ELLIPTA) 100-25 MCG/INH AEPB Inhale 1 puff into the lungs daily.    Historical Provider, MD  gabapentin (NEURONTIN) 300 MG capsule Take 300 mg by mouth 3 (three) times  daily. 12/03/13   Historical Provider, MD  guaiFENesin (MUCINEX) 600 MG 12 hr tablet Take 600 mg by mouth 2 (two) times daily.     Historical Provider, MD  HYDROcodone-acetaminophen (NORCO/VICODIN) 5-325 MG tablet Take 1 tablet by mouth every 6 (six) hours.     Historical Provider, MD  insulin aspart (NOVOLOG FLEXPEN) 100 UNIT/ML FlexPen Inject 2-10 Units into the skin 4 (four) times daily -  before meals and at bedtime. 150-200=2units Call np/md if less than 70 201-250=4units 251-300=6units 301-350=8units 351-400=10units Greater than 400 call np and md    Historical Provider, MD  Insulin Glargine (LANTUS SOLOSTAR) 100 UNIT/ML Solostar Pen Inject 20 Units into the skin at bedtime.    Historical Provider, MD  ipratropium-albuterol (DUONEB) 0.5-2.5 (3) MG/3ML SOLN Take 3 mLs by nebulization 3 (three) times daily. 11/01/14   Rosita Fire, MD  lactulose, encephalopathy, (CHRONULAC) 10 GM/15ML SOLN Take 14.5 g by mouth 3 (three) times daily. 47mls 3 times daily    Historical Provider, MD  Melatonin 5 MG CAPS Take 1 capsule by mouth at bedtime.    Historical Provider, MD  OXYGEN Inhale 3 L into the lungs daily as needed (for breathing).     Historical Provider, MD  polyethylene glycol (MIRALAX / GLYCOLAX) packet Take 17 g by mouth daily as needed for mild constipation or moderate constipation.     Historical Provider, MD  predniSONE (DELTASONE) 20 MG tablet Take 2 po QD x 3d, and 1 po QD x 3d 06/16/16   Rolland Porter, MD  PROAIR HFA 108 (90 BASE) MCG/ACT inhaler Inhale 2 puffs into the lungs every 6 (six) hours as needed for wheezing or shortness of breath. Shortness of breath/wheeze 12/05/13   Historical Provider, MD  rifaximin (XIFAXAN) 200 MG tablet Take 200 mg by mouth daily.    Historical Provider, MD  sennosides-docusate sodium (SENOKOT-S) 8.6-50 MG tablet Take 1 tablet by mouth daily as needed for constipation.     Historical Provider, MD  torsemide (DEMADEX) 20 MG tablet Take 40 mg by mouth 2 (two)  times daily.     Historical Provider, MD  traZODone (DESYREL) 50 MG tablet Take 150 mg by mouth at bedtime.    Historical Provider, MD    Family History Family History  Problem Relation Age of Onset  . Heart disease Father   . Cancer Mother     breast    Social History Social History  Substance Use Topics  . Smoking status: Former Smoker    Packs/day: 0.50    Types: Cigarettes    Quit date: 11/12/2015  . Smokeless tobacco: Never Used     Comment: slowing down  . Alcohol use No  uses  walker/wheelchair In nursing facility for past 1 1/2 years   Allergies   Penicillins   Review of Systems Review of Systems  All other systems reviewed and are negative.    Physical Exam Updated Vital Signs BP 127/55 (BP Location: Left Arm)   Pulse 66   Temp 97.7 F (36.5 C) (Oral)   Resp 18   Ht 6\' 3"  (1.905 m)   Wt (!) 385 lb (174.6 kg)   SpO2 95%   BMI 48.12 kg/m   Vital signs normal    Physical Exam  Constitutional: He is oriented to person, place, and time. He appears well-developed and well-nourished.  Non-toxic appearance. He does not appear ill. No distress.  obese  HENT:  Head: Normocephalic and atraumatic.  Right Ear: External ear normal.  Left Ear: External ear normal.  Nose: Nose normal. No mucosal edema or rhinorrhea.  Mouth/Throat: Oropharynx is clear and moist and mucous membranes are normal. No dental abscesses or uvula swelling.  Eyes: Conjunctivae and EOM are normal. Pupils are equal, round, and reactive to light.  Neck: Normal range of motion and full passive range of motion without pain. Neck supple.  Cardiovascular: Normal rate, regular rhythm and normal heart sounds.  Exam reveals no gallop and no friction rub.   No murmur heard. Pulmonary/Chest: Effort normal and breath sounds normal. No respiratory distress. He has no wheezes. He has no rhonchi. He has no rales. He exhibits no tenderness and no crepitus.    Patient is tender diffusely in his left  chest that reproduces his c/o pain.  Pt prefers to lay toward his left side which makes his left chest look bigger, however, when he lays flat his chest is symmetrical.   Abdominal: Soft. Normal appearance and bowel sounds are normal. He exhibits no distension. There is no tenderness. There is no rebound and no guarding.  Musculoskeletal: Normal range of motion. He exhibits no edema or tenderness.  Moves all extremities well. Pt is noted to have chronic changes to his lower extremities with mild edema and purplish discoloration of his LE which is chronic per patient.   Neurological: He is alert and oriented to person, place, and time. He has normal strength. No cranial nerve deficit.  Skin: Skin is warm, dry and intact. No rash noted. No erythema. No pallor.  Psychiatric: He has a normal mood and affect. His speech is normal and behavior is normal. His mood appears not anxious.  Nursing note and vitals reviewed.    ED Treatments / Results  Labs (all labs ordered are listed, but only abnormal results are displayed) Results for orders placed or performed during the hospital encounter of 06/15/16  CBG monitoring, ED  Result Value Ref Range   Glucose-Capillary 252 (H) 65 - 99 mg/dL   Comment 1 Notify RN    Comment 2 Document in Chart    Laboratory interpretation all normal except CRI, metabolic alkalosis, mild anemia    EKG  EKG Interpretation None       Radiology Dg Chest Portable 1 View  Result Date: 06/14/2016 CLINICAL DATA:  Left-sided chest pain and dyspnea for 3 days. EXAM: PORTABLE CHEST 1 VIEW COMPARISON:  12/13/2015 FINDINGS: Unchanged mild cardiomegaly unchanged mild vascular and interstitial prominence. No confluent airspace consolidation. No large effusions. IMPRESSION: Cardiomegaly and mild vascular/ interstitial prominence, unchanged. Electronically Signed   By: Andreas Newport M.D.   On: 06/14/2016 21:22    Procedures Procedures (including critical care  time)  Medications Ordered in ED  Medications  predniSONE (DELTASONE) tablet 40 mg (40 mg Oral Given 06/15/16 0411)  cyclobenzaprine (FLEXERIL) tablet 10 mg (10 mg Oral Given 06/15/16 0411)     Initial Impression / Assessment and Plan / ED Course  I have reviewed the triage vital signs and the nursing notes.  Pertinent labs & imaging results that were available during my care of the patient were reviewed by me and considered in my medical decision making (see chart for details).     Patient appears to have chest wall pain. He has already had testing earlier this evening. His pain is been constant for the past 3 days. His troponin earlier tonight was negative.   Pt was given oral prednisone because he has CRI and can't be placed on a NSAID which would help with his pain and he was given oral flexeril.  Recheck at time of discharge, he states his pain is improved.  We discussed comfort care with ice and heat, watching his diet while on the prednisone and he was discharged home on prednisone and flexeril.   Final Clinical Impressions(s) / ED Diagnoses   Final diagnoses:  Left-sided chest wall pain    New Prescriptions New Prescriptions   CYCLOBENZAPRINE (FLEXERIL) 10 MG TABLET    Take 1 tablet (10 mg total) by mouth 3 (three) times daily as needed (muscle soreness).   PREDNISONE (DELTASONE) 20 MG TABLET    Take 2 po QD x 3d, and 1 po QD x 3d    Plan discharge  Rolland Porter, MD, Barbette Or, MD 06/15/16 [id number]    Rolland Porter, MD 06/15/16 [id number]

## 2016-06-15 NOTE — ED Triage Notes (Signed)
Pt c/o left side sharp chest pain that started 3 days ago with sob that is worse with movement and palpation of chest area, pt was seen in er a few hours ago for same and discharged back to facility, pt continued to complain of chest pain, was given one neb treatment and one 10-325 Vicodin prior to arrival in er with no change in pain,

## 2016-06-15 NOTE — Discharge Instructions (Signed)
Use ice and heat on your chest for comfort. Take the prednisone and flexeril for your pain. While on the prednisone, you will need to watch your diet or your blood sugar will get really high.   Recheck if you get a fever, struggle to breathe or seem worse.

## 2016-06-15 NOTE — ED Notes (Signed)
Aaron Edelman, NP called to give report. States pt C/O increased left sided chest pain since pts last visit earlier today. Pt given Hydrocodone at the facility at 0250.

## 2016-08-03 ENCOUNTER — Ambulatory Visit (INDEPENDENT_AMBULATORY_CARE_PROVIDER_SITE_OTHER): Payer: Medicare Other | Admitting: "Endocrinology

## 2016-08-03 ENCOUNTER — Encounter: Payer: Self-pay | Admitting: "Endocrinology

## 2016-08-03 VITALS — BP 144/83 | HR 73 | Ht 75.0 in

## 2016-08-03 DIAGNOSIS — E212 Other hyperparathyroidism: Secondary | ICD-10-CM | POA: Diagnosis not present

## 2016-08-03 MED ORDER — CALCITRIOL 0.25 MCG PO CAPS: 0.2500 ug | ORAL_CAPSULE | Freq: Two times a day (BID) | ORAL | 3 refills | Status: DC

## 2016-08-03 NOTE — Progress Notes (Signed)
Subjective:    Patient ID: Jorge [name] [facility], male    DOB: Jan 11, 1966, PCP Carlynn Herald, MD   Past Medical History:  Diagnosis Date  . Cirrhosis ([place]) sleep apnea  . COPD (chronic obstructive pulmonary disease) ([place])   . Diabetes mellitus without complication ([place])   . Hypertension   . Renal disorder    stage II  . Renal insufficiency   . Sleep apnea   . Suicide attempt   . Syncope   . Tobacco abuse    Past Surgical History:  Procedure Laterality Date  . CHOLECYSTECTOMY    . LEG SURGERY Left    Social History   Social History  . Marital status: Single    Spouse name: N/A  . Number of children: N/A  . Years of education: N/A   Social History Main Topics  . Smoking status: Former Smoker    Packs/day: 0.50    Types: Cigarettes    Quit date: 11/12/2015  . Smokeless tobacco: Never Used     Comment: slowing down  . Alcohol use No  . Drug use: No  . Sexual activity: Not Currently   Other Topics Concern  . None   Social History Narrative  . None   Outpatient Encounter Prescriptions as of 08/03/2016  Medication Sig  . ARIPiprazole (ABILIFY) 20 MG tablet Take 20 mg by mouth daily.  Marland Kitchen aspirin 81 MG tablet Take 81 mg by mouth daily.  Marland Kitchen atenolol (TENORMIN) 25 MG tablet Take 25 mg by mouth daily.   . calcitRIOL (ROCALTROL) 0.25 MCG capsule Take 1 capsule (0.25 mcg total) by mouth 2 (two) times daily with a meal.  . calcium citrate (CALCITRATE - DOSED IN MG ELEMENTAL CALCIUM) 950 MG tablet Take 1 tablet by mouth daily.  . cholecalciferol (VITAMIN D) 1000 UNITS tablet Take 1,000 Units by mouth daily. Reported on 05/03/2015  . citalopram (CELEXA) 40 MG tablet Take 40 mg by mouth at bedtime.   . cyclobenzaprine (FLEXERIL) 10 MG tablet Take 1 tablet (10 mg total) by mouth 3 (three) times daily as needed (muscle soreness).  . Fe Bisgly-Succ-C-Thre-B12-FA (IRON-150 PO) Take 1 tablet by mouth daily.  . fluticasone furoate-vilanterol (BREO ELLIPTA) 100-25 MCG/INH AEPB  Inhale 1 puff into the lungs daily.  Marland Kitchen gabapentin (NEURONTIN) 300 MG capsule Take 300 mg by mouth 3 (three) times daily.  Marland Kitchen guaiFENesin (MUCINEX) 600 MG 12 hr tablet Take 600 mg by mouth 2 (two) times daily.   Marland Kitchen HYDROcodone-acetaminophen (NORCO/VICODIN) 5-325 MG tablet Take 1 tablet by mouth every 6 (six) hours.   . insulin aspart (NOVOLOG FLEXPEN) 100 UNIT/ML FlexPen Inject 2-10 Units into the skin 4 (four) times daily -  before meals and at bedtime. 150-200=2units Call np/md if less than 70 201-250=4units 251-300=6units 301-350=8units 351-400=10units Greater than 400 call np and md  . Insulin Glargine (LANTUS SOLOSTAR) 100 UNIT/ML Solostar Pen Inject 20 Units into the skin at bedtime.  Marland Kitchen ipratropium-albuterol (DUONEB) 0.5-2.5 (3) MG/3ML SOLN Take 3 mLs by nebulization 3 (three) times daily.  Marland Kitchen lactulose, encephalopathy, (CHRONULAC) 10 GM/15ML SOLN Take 14.5 g by mouth 3 (three) times daily. 81mls 3 times daily  . Melatonin 5 MG CAPS Take 1 capsule by mouth at bedtime.  . OXYGEN Inhale 3 L into the lungs daily as needed (for breathing).   . polyethylene glycol (MIRALAX / GLYCOLAX) packet Take 17 g by mouth daily as needed for mild constipation or moderate constipation.   . predniSONE (DELTASONE) 20 MG tablet  Take 2 po QD x 3d, and 1 po QD x 3d  . PROAIR HFA 108 (90 BASE) MCG/ACT inhaler Inhale 2 puffs into the lungs every 6 (six) hours as needed for wheezing or shortness of breath. Shortness of breath/wheeze  . rifaximin (XIFAXAN) 200 MG tablet Take 200 mg by mouth daily.  . sennosides-docusate sodium (SENOKOT-S) 8.6-50 MG tablet Take 1 tablet by mouth daily as needed for constipation.   . torsemide (DEMADEX) 20 MG tablet Take 40 mg by mouth 2 (two) times daily.   . traZODone (DESYREL) 50 MG tablet Take 150 mg by mouth at bedtime.   No facility-administered encounter medications on file as of 08/03/2016.    ALLERGIES: Allergies  Allergen Reactions  . Penicillins Anaphylaxis    Patient  states he is not allergic to anything;  Pt says it's his twin brother that has this allergy.    VACCINATION STATUS: Immunization History  Administered Date(s) Administered  . Influenza-Unspecified 02/13/2015  . Pneumococcal Polysaccharide-23 08/31/2014    HPI  51 year old gentleman with medical history as above. He is being seen in consultation for elevated PTH requested by Dr. Mal Amabile. -His medical history is long and complicated. He is a nursing home, wheelchair-bound due to deconditioning from prior falls. - He is known to have chronic renal insufficiency requiring nephrology care when he was in [place] area. He was moved to [place] 5 years ago, since when he did not see a nephrologist. - He is a poor historian, not accompanied by any aid to the exam room. - Review of his medical records shows on 06/04/2016 she was found to have PTH elevated at 95.14, calcium 8.3, GFR 53, BUN elevated at 39, creatinine elevated at 1.48. His 25-hydroxy vitamin D level was normal at 41. - Review of his medications show that recently cholecalciferol 1000 units daily was added. - He denies any prior history of parathyroid, thyroid, pituitary, nor adrenal dysfunction. - He has been overweight/obese most of his adult life, is an active smoker. He uses oxygen per nasal cannula, related to his history of advanced COPD.  Review of Systems  Constitutional:  Wheelchair-bound, + Progressive weight gain,  + fatigue, no subjective hyperthermia, no subjective hypothermia Eyes: no blurry vision, no xerophthalmia ENT: no sore throat, no nodules palpated in throat, no dysphagia/odynophagia, no hoarseness Cardiovascular: no Chest Pain, + Shortness of Breath, no palpitations, + leg swelling Respiratory: + cough, + SOB Gastrointestinal: no Nausea/Vomiting/Diarhhea Musculoskeletal: no muscle/joint aches Skin: no rashes Neurological: + tremors, no numbness, no tingling, no dizziness Psychiatric: + depression, no  anxiety  Objective:    BP (!) 144/83   Pulse 73   Ht 6\' 3"  (1.905 m)   Wt Readings from Last 3 Encounters:  06/15/16 (!) 385 lb (174.6 kg)  06/14/16 (!) 385 lb (174.6 kg)  01/13/16 (!) 379 lb (171.9 kg)    Physical Exam  Constitutional: On a wheelchair using oxygen per nasal cannula, significantly over weight for hight, not in acute distress, normal state of mind, disheveled with poor hygiene. Eyes: PERRLA, EOMI, no exophthalmos ENT: moist mucous membranes, thick neck difficult to examine ,  No gross thyromegaly, no cervical lymphadenopathy Cardiovascular: Distant heart sounds, no Murmur/Rubs/Gallops Respiratory:  inadequate breathing efforts, no gross chest deformity, + bilateral rales. Gastrointestinal: abdomen soft, + obese, Non -tender, + distension, Bowel Sounds present Musculoskeletal: Nonpitting edema on bilateral resting this,+ tight skin, suspicious for lymphedema. Skin: + Dry skin, no active rashes Neurological: + Gross tremor at rest, depressed Deep  tendon reflexes in  bilateral extremities.  CMP Latest Ref Rng & Units 06/14/2016 12/16/2015 12/15/2015  Glucose 65 - 99 mg/dL 338(H) 325(H) 259(H)  BUN 6 - 20 mg/dL 57(H) 58(H) 50(H)  Creatinine 0.61 - 1.24 mg/dL 2.22(H) 1.54(H) 1.47(H)  Sodium 135 - 145 mmol/L 136 134(L) 139  Potassium 3.5 - 5.1 mmol/L 3.9 4.2 4.0  Chloride 101 - 111 mmol/L 85(L) 93(L) 93(L)  CO2 22 - 32 mmol/L 43(H) 36(H) 37(H)  Calcium 8.9 - 10.3 mg/dL 8.5(L) 8.1(L) 8.6(L)  Total Protein 6.5 - 8.1 g/dL 7.6 - -  Total Bilirubin 0.3 - 1.2 mg/dL 0.7 - -  Alkaline Phos 38 - 126 U/L 79 - -  AST 15 - 41 U/L 23 - -  ALT 17 - 63 U/L 27 - -      Diabetic Labs (most recent): Lab Results  Component Value Date   HGBA1C 5.0 03/26/2013     Lipid Panel ( most recent) Lipid Panel     Component Value Date/Time   CHOL 112 12/23/2011 0720   TRIG 85 12/23/2011 0720   HDL 33 (L) 12/23/2011 0720   VLDL 17 12/23/2011 0720   LDLCALC 62 12/23/2011 0720     Labs  from 06/04/2016 showed calcium low at 8.3, PTH high at 95.14 (normal 15-65), GFR low at 53,    Assessment & Plan:   1. Secondary hyperparathyroidism, due to CKD  - Review of his labs suggest that he has secondary hyperparathyroidism due to renal insufficiency, likely triggered by mild hypocalcemia of 8.3. See below. He will benefit from addition of calcitriol.  I will initiate 0.25 g by mouth twice a day with meals. I advised to continue cholecalciferol 1000 units daily, his most recent vitamin D 25 level was 41-acceptable.  - It is unlikely that he has primary hyperparathyroidism. -He will also benefit from nephrology evaluation, notes that he did have nephrology care when he was in a different city. - He will return in 3 months with repeat: - PTH, intact and calcium, - Magnesium,- Phosphorus and thyroid function tests. - He is a poor surgical candidate even if he has primary hyperparathyroidism.  2. Hypocalcemia -His calcium level was 8.3 consistent with mild hypocalcemia likely related to renal insufficiency. His on calcium citrate 950 mg daily, I advised to continue. -Addition of 1, 25 hydroxy vitamin D in the form of calcitriol will help with hypocalcemia as well.  - I advised patient to maintain close follow up with Carlynn Herald, MD for primary care needs. Follow up plan: Return in about 3 months (around 11/03/2016) for follow up with pre-visit labs.  Glade Lloyd, MD Phone: [phone]  Fax: [phone]   08/03/2016, 9:10 AM

## 2016-09-20 ENCOUNTER — Emergency Department (HOSPITAL_COMMUNITY): Payer: Medicare Other

## 2016-09-20 ENCOUNTER — Encounter (HOSPITAL_COMMUNITY): Payer: Self-pay | Admitting: *Deleted

## 2016-09-20 ENCOUNTER — Inpatient Hospital Stay (HOSPITAL_COMMUNITY)
Admission: EM | Admit: 2016-09-20 | Discharge: 2016-09-24 | DRG: 189 | Disposition: A | Discharge status: Home / Self Care | Payer: Medicare Other | Attending: Internal Medicine | Admitting: Internal Medicine

## 2016-09-20 DIAGNOSIS — Z803 Family history of malignant neoplasm of breast: Secondary | ICD-10-CM

## 2016-09-20 DIAGNOSIS — Z7951 Long term (current) use of inhaled steroids: Secondary | ICD-10-CM

## 2016-09-20 DIAGNOSIS — J9621 Acute and chronic respiratory failure with hypoxia: Secondary | ICD-10-CM | POA: Diagnosis present

## 2016-09-20 DIAGNOSIS — G8929 Other chronic pain: Secondary | ICD-10-CM | POA: Diagnosis present

## 2016-09-20 DIAGNOSIS — Z9119 Patient's noncompliance with other medical treatment and regimen: Secondary | ICD-10-CM

## 2016-09-20 DIAGNOSIS — Z6841 Body Mass Index (BMI) 40.0 and over, adult: Secondary | ICD-10-CM

## 2016-09-20 DIAGNOSIS — I10 Essential (primary) hypertension: Secondary | ICD-10-CM | POA: Diagnosis present

## 2016-09-20 DIAGNOSIS — T40601A Poisoning by unspecified narcotics, accidental (unintentional), initial encounter: Secondary | ICD-10-CM | POA: Diagnosis present

## 2016-09-20 DIAGNOSIS — J9622 Acute and chronic respiratory failure with hypercapnia: Secondary | ICD-10-CM | POA: Diagnosis not present

## 2016-09-20 DIAGNOSIS — Z79899 Other long term (current) drug therapy: Secondary | ICD-10-CM

## 2016-09-20 DIAGNOSIS — J441 Chronic obstructive pulmonary disease with (acute) exacerbation: Secondary | ICD-10-CM | POA: Diagnosis present

## 2016-09-20 DIAGNOSIS — Z7982 Long term (current) use of aspirin: Secondary | ICD-10-CM

## 2016-09-20 DIAGNOSIS — E875 Hyperkalemia: Secondary | ICD-10-CM

## 2016-09-20 DIAGNOSIS — N183 Chronic kidney disease, stage 3 unspecified: Secondary | ICD-10-CM | POA: Diagnosis present

## 2016-09-20 DIAGNOSIS — R079 Chest pain, unspecified: Secondary | ICD-10-CM | POA: Diagnosis present

## 2016-09-20 DIAGNOSIS — E869 Volume depletion, unspecified: Secondary | ICD-10-CM | POA: Diagnosis present

## 2016-09-20 DIAGNOSIS — Z8249 Family history of ischemic heart disease and other diseases of the circulatory system: Secondary | ICD-10-CM

## 2016-09-20 DIAGNOSIS — Z87891 Personal history of nicotine dependence: Secondary | ICD-10-CM

## 2016-09-20 DIAGNOSIS — Z794 Long term (current) use of insulin: Secondary | ICD-10-CM

## 2016-09-20 DIAGNOSIS — K7460 Unspecified cirrhosis of liver: Secondary | ICD-10-CM | POA: Diagnosis present

## 2016-09-20 DIAGNOSIS — J449 Chronic obstructive pulmonary disease, unspecified: Secondary | ICD-10-CM | POA: Diagnosis present

## 2016-09-20 DIAGNOSIS — J9612 Chronic respiratory failure with hypercapnia: Secondary | ICD-10-CM | POA: Diagnosis present

## 2016-09-20 DIAGNOSIS — E1122 Type 2 diabetes mellitus with diabetic chronic kidney disease: Secondary | ICD-10-CM | POA: Diagnosis present

## 2016-09-20 DIAGNOSIS — N179 Acute kidney failure, unspecified: Secondary | ICD-10-CM

## 2016-09-20 DIAGNOSIS — N189 Chronic kidney disease, unspecified: Secondary | ICD-10-CM

## 2016-09-20 DIAGNOSIS — Z880 Allergy status to penicillin: Secondary | ICD-10-CM

## 2016-09-20 DIAGNOSIS — Z9981 Dependence on supplemental oxygen: Secondary | ICD-10-CM

## 2016-09-20 DIAGNOSIS — G4730 Sleep apnea, unspecified: Secondary | ICD-10-CM | POA: Diagnosis present

## 2016-09-20 DIAGNOSIS — Z79891 Long term (current) use of opiate analgesic: Secondary | ICD-10-CM

## 2016-09-20 LAB — COMPREHENSIVE METABOLIC PANEL
ALK PHOS: 71 U/L (ref 38–126)
ALT: 20 U/L (ref 17–63)
ANION GAP: 9 (ref 5–15)
AST: 19 U/L (ref 15–41)
Albumin: 3.4 g/dL — ABNORMAL LOW (ref 3.5–5.0)
BILIRUBIN TOTAL: 0.7 mg/dL (ref 0.3–1.2)
BUN: 107 mg/dL — ABNORMAL HIGH (ref 6–20)
CALCIUM: 8.2 mg/dL — AB (ref 8.9–10.3)
CO2: 34 mmol/L — ABNORMAL HIGH (ref 22–32)
CREATININE: 4.59 mg/dL — AB (ref 0.61–1.24)
Chloride: 94 mmol/L — ABNORMAL LOW (ref 101–111)
GFR calc Af Amer: 16 mL/min — ABNORMAL LOW (ref >60)
GFR calc non Af Amer: 13 mL/min — ABNORMAL LOW (ref >60)
Glucose, Bld: 129 mg/dL — ABNORMAL HIGH (ref 65–99)
Potassium: 6.2 mmol/L — ABNORMAL HIGH (ref 3.5–5.1)
Sodium: 137 mmol/L (ref 135–145)
TOTAL PROTEIN: 7.1 g/dL (ref 6.5–8.1)

## 2016-09-20 LAB — BLOOD GAS, ARTERIAL
ACID-BASE EXCESS: 6.4 mmol/L — AB (ref 0.0–2.0)
BICARBONATE: 27.9 mmol/L (ref 20.0–28.0)
DRAWN BY: 22223
O2 Content: 3 L/min
O2 Saturation: 88.9 %
PCO2 ART: 96.3 mmHg — AB (ref 32.0–48.0)
PH ART: 7.179 — AB (ref 7.350–7.450)
pO2, Arterial: 66.3 mmHg — ABNORMAL LOW (ref 83.0–108.0)

## 2016-09-20 LAB — CBC
HCT: 34.2 % — ABNORMAL LOW (ref 39.0–52.0)
Hemoglobin: 10.4 g/dL — ABNORMAL LOW (ref 13.0–17.0)
MCH: 35.5 pg — AB (ref 26.0–34.0)
MCHC: 30.4 g/dL (ref 30.0–36.0)
MCV: 116.7 fL — AB (ref 78.0–100.0)
PLATELETS: 79 10*3/uL — AB (ref 150–400)
RBC: 2.93 MIL/uL — ABNORMAL LOW (ref 4.22–5.81)
RDW: 16.7 % — AB (ref 11.5–15.5)
WBC: 7.2 10*3/uL (ref 4.0–10.5)

## 2016-09-20 LAB — I-STAT TROPONIN, ED: TROPONIN I, POC: 0.6 ng/mL — AB (ref 0.00–0.08)

## 2016-09-20 LAB — BRAIN NATRIURETIC PEPTIDE: B NATRIURETIC PEPTIDE 5: 621 pg/mL — AB (ref 0.0–100.0)

## 2016-09-20 LAB — LIPASE, BLOOD: Lipase: 32 U/L (ref 11–51)

## 2016-09-20 LAB — TROPONIN I: TROPONIN I: 0.74 ng/mL — AB (ref <0.03)

## 2016-09-20 LAB — D-DIMER, QUANTITATIVE: D-Dimer, Quant: 0.8 ug/mL-FEU — ABNORMAL HIGH (ref 0.00–0.50)

## 2016-09-20 MED ORDER — IPRATROPIUM-ALBUTEROL 0.5-2.5 (3) MG/3ML IN SOLN
3.0000 mL | Freq: Once | RESPIRATORY_TRACT | Status: AC
  Administered 2016-09-20: 3 mL via RESPIRATORY_TRACT
  Filled 2016-09-20: qty 3

## 2016-09-20 MED ORDER — DEXTROSE 10 % IV SOLN
Freq: Once | INTRAVENOUS | Status: AC
  Administered 2016-09-21: 01:00:00 via INTRAVENOUS

## 2016-09-20 MED ORDER — CALCIUM GLUCONATE 10 % IV SOLN
1.0000 g | Freq: Once | INTRAVENOUS | Status: AC
  Administered 2016-09-21: 1 g via INTRAVENOUS
  Filled 2016-09-20: qty 10

## 2016-09-20 MED ORDER — NALOXONE HCL 0.4 MG/ML IJ SOLN
0.4000 mg | Freq: Once | INTRAMUSCULAR | Status: AC
  Administered 2016-09-20: 0.4 mg via INTRAVENOUS
  Filled 2016-09-20: qty 1

## 2016-09-20 MED ORDER — INSULIN ASPART 100 UNIT/ML IV SOLN
5.0000 [IU] | Freq: Once | INTRAVENOUS | Status: AC
  Administered 2016-09-21: 5 [IU] via INTRAVENOUS

## 2016-09-20 MED ORDER — ASPIRIN 81 MG PO CHEW
324.0000 mg | CHEWABLE_TABLET | Freq: Once | ORAL | Status: AC
  Administered 2016-09-20: 324 mg via ORAL
  Filled 2016-09-20: qty 4

## 2016-09-20 MED ORDER — SODIUM CHLORIDE 0.9 % IV BOLUS (SEPSIS)
500.0000 mL | Freq: Once | INTRAVENOUS | Status: AC
  Administered 2016-09-20: 500 mL via INTRAVENOUS

## 2016-09-20 NOTE — ED Triage Notes (Signed)
Pt brought in by [facility] for c/o chest pain; pt c/o left sided chest pain that does not radiate; pt states the pain started a few hours ago and was given a hydrocodone with some relief

## 2016-09-20 NOTE — ED Provider Notes (Signed)
[facility] DEPT Provider Note   CSN: [id number] Arrival date & time: 09/20/16  2237  By signing my name below, I, [name], attest that this documentation has been prepared under the direction and in the presence of [name], MD. Electronically Signed: [name], ED Scribe. 09/20/16. 11:09 PM.  LEVEL V CAVEAT: HPI and ROS limited due to confusion.   History   Chief Complaint Chief Complaint  Patient presents with  . Chest Pain    HPI Jorge [name] is a 51 y.o. male who presents to the Emergency Department complaining of intermittent CP that started in the afternoon of 09/20/16. Associated sx include SOB, nausea, bilateral leg edema. Pt denies vomiting. Was given hydrocodone with mild relief from the nurse at his living facility.    The history is provided by the patient. The history is limited by the condition of the patient. No language interpreter was used.    Past Medical History:  Diagnosis Date  . Cirrhosis ([place]) sleep apnea  . COPD (chronic obstructive pulmonary disease) ([place])   . Diabetes mellitus without complication ([place])   . Hypertension   . Renal disorder    stage II  . Renal insufficiency   . Sleep apnea   . Suicide attempt ([place])   . Syncope   . Tobacco abuse     Patient Active Problem List   Diagnosis Date Noted  . Other hyperparathyroidism ([place]) 08/03/2016  . Hypocalcemia 08/03/2016  . DNR (do not resuscitate) 12/13/2015  . Diabetes mellitus ([place]) 12/13/2015  . Pressure ulcer 12/23/2014  . Syncope 12/23/2014  . Anemia 12/21/2014  . Thrombocytopenia ([place]) 10/27/2014  . Hyperglycemia 10/27/2014  . COPD with exacerbation ([place]) 10/27/2014  . Acute on chronic respiratory failure with hypoxia ([place]) 10/27/2014  . Sleep apnea   . Tobacco abuse   . Insomnia 10/03/2014  . Chronic pain 10/03/2014  . GERD without esophagitis 10/03/2014  . Chronic obstructive pulmonary disease with acute exacerbation ([place])   . Renal insufficiency   .  Pain in the chest   . Essential hypertension   . COPD exacerbation ([place]) 08/29/2014  . Chest pain 08/29/2014  . CKD (chronic kidney disease) stage 3, GFR 30-59 ml/min 08/29/2014  . Acute respiratory failure with hypoxia ([place]) 08/29/2014  . Cirrhosis ([place])   . Hypertension     Past Surgical History:  Procedure Laterality Date  . CHOLECYSTECTOMY    . LEG SURGERY Left        Home Medications    Prior to Admission medications   Medication Sig Start Date End Date Taking? Authorizing Provider  ARIPiprazole (ABILIFY) 20 MG tablet Take 20 mg by mouth daily.    [provider]  aspirin 81 MG tablet Take 81 mg by mouth daily.    [provider]  atenolol (TENORMIN) 25 MG tablet Take 25 mg by mouth daily.     [provider]  calcitRIOL (ROCALTROL) 0.25 MCG capsule Take 1 capsule (0.25 mcg total) by mouth 2 (two) times daily with a meal. 08/03/16   Nida, Marella Chimes, MD  calcium citrate (CALCITRATE - DOSED IN MG ELEMENTAL CALCIUM) 950 MG tablet Take 1 tablet by mouth daily.    [provider]  cholecalciferol (VITAMIN D) 1000 UNITS tablet Take 1,000 Units by mouth daily. Reported on 05/03/2015    [provider]  citalopram (CELEXA) 40 MG tablet Take 40 mg by mouth at bedtime.  12/03/13   [provider]  cyclobenzaprine (FLEXERIL) 10 MG tablet Take  1 tablet (10 mg total) by mouth 3 (three) times daily as needed (muscle soreness). 06/15/16   Rolland Porter, MD  Fe Bisgly-Succ-C-Thre-B12-FA (IRON-150 PO) Take 1 tablet by mouth daily.    [provider]  fluticasone furoate-vilanterol (BREO ELLIPTA) 100-25 MCG/INH AEPB Inhale 1 puff into the lungs daily.    [provider]  gabapentin (NEURONTIN) 300 MG capsule Take 300 mg by mouth 3 (three) times daily. 12/03/13   [provider]  guaiFENesin (MUCINEX) 600 MG 12 hr tablet Take 600 mg by mouth 2 (two) times daily.     [provider]  HYDROcodone-acetaminophen  (NORCO/VICODIN) 5-325 MG tablet Take 1 tablet by mouth every 6 (six) hours.     [provider]  insulin aspart (NOVOLOG FLEXPEN) 100 UNIT/ML FlexPen Inject 2-10 Units into the skin 4 (four) times daily -  before meals and at bedtime. 150-200=2units Call np/md if less than 70 201-250=4units 251-300=6units 301-350=8units 351-400=10units Greater than 400 call np and md    [provider]  Insulin Glargine (LANTUS SOLOSTAR) 100 UNIT/ML Solostar Pen Inject 20 Units into the skin at bedtime.    [provider]  ipratropium-albuterol (DUONEB) 0.5-2.5 (3) MG/3ML SOLN Take 3 mLs by nebulization 3 (three) times daily. 11/01/14   Rosita Fire, MD  lactulose, encephalopathy, (CHRONULAC) 10 GM/15ML SOLN Take 14.5 g by mouth 3 (three) times daily. 29mls 3 times daily    [provider]  Melatonin 5 MG CAPS Take 1 capsule by mouth at bedtime.    [provider]  OXYGEN Inhale 3 L into the lungs daily as needed (for breathing).     [provider]  polyethylene glycol (MIRALAX / GLYCOLAX) packet Take 17 g by mouth daily as needed for mild constipation or moderate constipation.     [provider]  predniSONE (DELTASONE) 20 MG tablet Take 2 po QD x 3d, and 1 po QD x 3d 06/16/16   Rolland Porter, MD  PROAIR HFA 108 (90 BASE) MCG/ACT inhaler Inhale 2 puffs into the lungs every 6 (six) hours as needed for wheezing or shortness of breath. Shortness of breath/wheeze 12/05/13   [provider]  rifaximin (XIFAXAN) 200 MG tablet Take 200 mg by mouth daily.    [provider]  sennosides-docusate sodium (SENOKOT-S) 8.6-50 MG tablet Take 1 tablet by mouth daily as needed for constipation.     [provider]  torsemide (DEMADEX) 20 MG tablet Take 40 mg by mouth 2 (two) times daily.     [provider]  traZODone (DESYREL) 50 MG tablet Take 150 mg by mouth at bedtime.    [provider]    Family History Family History   Problem Relation Age of Onset  . Heart disease Father   . Cancer Mother     breast    Social History Social History  Substance Use Topics  . Smoking status: Former Smoker    Packs/day: 0.50    Types: Cigarettes    Quit date: 11/12/2015  . Smokeless tobacco: Never Used     Comment: slowing down  . Alcohol use No     Allergies   Penicillins   Review of Systems Review of Systems  Unable to perform ROS: Other (confusion)     Physical Exam Updated Vital Signs BP (!) 155/119 (BP Location: Left Arm)   Pulse (!) 59   Temp 97.8 F (36.6 C) (Oral)   Resp 13   Ht 6\' 3"  (1.905 m)  Wt (!) 385 lb (174.6 kg)   SpO2 93%   BMI 48.12 kg/m   Physical Exam  Constitutional: He is oriented to person, place, and time. He appears well-developed and well-nourished.  Morbidly obese.   HENT:  Head: Normocephalic and atraumatic.  Right Ear: External ear normal.  Left Ear: External ear normal.  Nose: Nose normal.  Eyes: Right eye exhibits no discharge. Left eye exhibits no discharge.  Neck: Neck supple.  Cardiovascular: Normal rate, regular rhythm and normal heart sounds.   Pulmonary/Chest: Effort normal. He has wheezes.  Decreased breathe sounds diffusively with mild wheezing.   Abdominal: Soft. There is no tenderness.  Musculoskeletal: He exhibits edema (non-pitting).  Symmetric bilateral leg swelling.  Neurological: He is alert and oriented to person, place, and time.  Very sleepy, difficulty keeping eyes awake, sometimes responds inappropriately.   Skin: Skin is warm and dry.  Nursing note and vitals reviewed.    ED Treatments / Results  DIAGNOSTIC STUDIES: Oxygen Saturation is 93% on , low by my interpretation.   COORDINATION OF CARE: 11:09 PM-Discussed next steps with pt. Pt verbalized understanding and is agreeable with the plan.    Labs (all labs ordered are listed, but only abnormal results are displayed) Labs Reviewed  CBC - Abnormal; Notable for the  following:       Result Value   RBC 2.93 (*)    Hemoglobin 10.4 (*)    HCT 34.2 (*)    MCV 116.7 (*)    MCH 35.5 (*)    RDW 16.7 (*)    Platelets 79 (*)    All other components within normal limits  COMPREHENSIVE METABOLIC PANEL - Abnormal; Notable for the following:    Potassium 6.2 (*)    Chloride 94 (*)    CO2 34 (*)    Glucose, Bld 129 (*)    BUN 107 (*)    Creatinine, Ser 4.59 (*)    Calcium 8.2 (*)    Albumin 3.4 (*)    GFR calc non Af Amer 13 (*)    GFR calc Af Amer 16 (*)    All other components within normal limits  TROPONIN I - Abnormal; Notable for the following:    Troponin I 0.74 (*)    All other components within normal limits  BRAIN NATRIURETIC PEPTIDE - Abnormal; Notable for the following:    B Natriuretic Peptide 621.0 (*)    All other components within normal limits  D-DIMER, QUANTITATIVE (NOT AT [facility]) - Abnormal; Notable for the following:    D-Dimer, Quant 0.80 (*)    All other components within normal limits  BLOOD GAS, ARTERIAL - Abnormal; Notable for the following:    pH, Arterial 7.179 (*)    pCO2 arterial 96.3 (*)    pO2, Arterial 66.3 (*)    Acid-Base Excess 6.4 (*)    All other components within normal limits  GLUCOSE, RANDOM - Abnormal; Notable for the following:    Glucose, Bld 145 (*)    All other components within normal limits  BLOOD GAS, ARTERIAL - Abnormal; Notable for the following:    pH, Arterial 7.173 (*)    pCO2 arterial 93.9 (*)    Acid-Base Excess 5.0 (*)    All other components within normal limits  AMMONIA - Abnormal; Notable for the following:    Ammonia 59 (*)    All other components within normal limits  POTASSIUM - Abnormal; Notable for the following:    Potassium 6.0 (*)  All other components within normal limits  TROPONIN I - Abnormal; Notable for the following:    Troponin I 0.72 (*)    All other components within normal limits  BLOOD GAS, ARTERIAL - Abnormal; Notable for the following:    pH, Arterial 7.244  (*)    pCO2 arterial 73.6 (*)    Acid-Base Excess 3.8 (*)    All other components within normal limits  GLUCOSE, CAPILLARY - Abnormal; Notable for the following:    Glucose-Capillary 155 (*)    All other components within normal limits  GLUCOSE, CAPILLARY - Abnormal; Notable for the following:    Glucose-Capillary 164 (*)    All other components within normal limits  I-STAT TROPOININ, ED - Abnormal; Notable for the following:    Troponin i, poc 0.60 (*)    All other components within normal limits  CBG MONITORING, ED - Abnormal; Notable for the following:    Glucose-Capillary 113 (*)    All other components within normal limits  MRSA PCR SCREENING  LIPASE, BLOOD  APTT  PROTIME-INR  LACTIC ACID, PLASMA  LACTIC ACID, PLASMA  URINALYSIS, ROUTINE W REFLEX MICROSCOPIC  HIV ANTIBODY (ROUTINE TESTING)  TROPONIN I  TROPONIN I  BASIC METABOLIC PANEL  CBG MONITORING, ED    EKG  EKG Interpretation  Date/Time:  Wednesday Sep 20 2016 22:44:18 EDT Ventricular Rate:  59 PR Interval:    QRS Duration: 115 QT Interval:  444 QTC Calculation: 440 R Axis:   85 Text Interpretation:  Sinus rhythm Nonspecific intraventricular conduction delay Low voltage, precordial leads Probable anteroseptal infarct, old Borderline repolarization abnormality ST depressions inferiorly appear new compared to Jan 2018 Confirmed by [name] [phone]) on 09/20/2016 10:55:48 PM       Radiology Dg Chest Portable 1 View  Result Date: 09/21/2016 CLINICAL DATA:  Chest pain and shortness of breath. History of hypertension, COPD, diabetes, former smoker. EXAM: PORTABLE CHEST 1 VIEW COMPARISON:  06/14/2016 FINDINGS: Examination is somewhat limited due to patient positioning. Shallow inspiration. Cardiac enlargement without vascular congestion. No focal consolidation in the lungs. No blunting of costophrenic angles. No pneumothorax. IMPRESSION: No evidence of active pulmonary disease.  Cardiac enlargement.  Electronically Signed   By: Lucienne Capers M.D.   On: 09/21/2016 00:21    Procedures Procedures (including critical care time)  CRITICAL CARE Performed by: [name] T   Total critical care time: 40 minutes  Critical care time was exclusive of separately billable procedures and treating other patients.  Critical care was necessary to treat or prevent imminent or life-threatening deterioration.  Critical care was time spent personally by me on the following activities: development of treatment plan with patient and/or surrogate as well as nursing, discussions with consultants, evaluation of patient's response to treatment, examination of patient, obtaining history from patient or surrogate, ordering and performing treatments and interventions, ordering and review of laboratory studies, ordering and review of radiographic studies, pulse oximetry and re-evaluation of patient's condition.   Medications Ordered in ED Medications - No data to display   Initial Impression / Assessment and Plan / ED Course  I have reviewed the triage vital signs and the nursing notes.  Pertinent labs & imaging results that were available during my care of the patient were reviewed by me and considered in my medical decision making (see chart for details).  Clinical Course as of Sep 21 932  Thu Sep 21, 2016  7322 Dr Shanon Brow asks for repeat ABG in 1 hour after bipap, work  on potassium. Will re-eval in 1 hour for if he needs [facility] or [facility] admission  [SG]    Clinical Course User Index [SG] [name], MD    Patient's respiratory failure appears multifactorial. Likely a combination of chronic COPD/sleep apnea in addition to renal failure while on multiple sedating meds. Mental status much better after low dose of narcan. Given acidosis, placed on bipap with clinical improvement. Air movement much better. No clear source of infection, and no hypoxia. Most likely is dehydrated based on exam. Given CHF  history, given small fluid boluses. He arrives with a MOST form and DNR that he signed in past. He verbally tells me now "I want to live" and wants everything done. Will admit to stepdown with hospitalist.   Final Clinical Impressions(s) / ED Diagnoses   Final diagnoses:  Acute on chronic respiratory failure with hypercapnia ([place])  Acute renal failure superimposed on chronic kidney disease, unspecified CKD stage, unspecified acute renal failure type ([place])  Hyperkalemia    New Prescriptions New Prescriptions   No medications on file   I personally performed the services described in this documentation, which was scribed in my presence. The recorded information has been reviewed and is accurate.    [name], MD 09/21/16 [phone]

## 2016-09-21 ENCOUNTER — Inpatient Hospital Stay (HOSPITAL_BASED_OUTPATIENT_CLINIC_OR_DEPARTMENT_OTHER): Payer: Medicare Other

## 2016-09-21 ENCOUNTER — Encounter (HOSPITAL_COMMUNITY): Payer: Self-pay | Admitting: *Deleted

## 2016-09-21 DIAGNOSIS — Z880 Allergy status to penicillin: Secondary | ICD-10-CM | POA: Diagnosis not present

## 2016-09-21 DIAGNOSIS — T40601A Poisoning by unspecified narcotics, accidental (unintentional), initial encounter: Secondary | ICD-10-CM | POA: Diagnosis present

## 2016-09-21 DIAGNOSIS — R079 Chest pain, unspecified: Secondary | ICD-10-CM | POA: Diagnosis present

## 2016-09-21 DIAGNOSIS — Z9981 Dependence on supplemental oxygen: Secondary | ICD-10-CM | POA: Diagnosis not present

## 2016-09-21 DIAGNOSIS — E875 Hyperkalemia: Secondary | ICD-10-CM | POA: Diagnosis present

## 2016-09-21 DIAGNOSIS — Z79891 Long term (current) use of opiate analgesic: Secondary | ICD-10-CM | POA: Diagnosis not present

## 2016-09-21 DIAGNOSIS — N183 Chronic kidney disease, stage 3 (moderate): Secondary | ICD-10-CM | POA: Diagnosis not present

## 2016-09-21 DIAGNOSIS — G4730 Sleep apnea, unspecified: Secondary | ICD-10-CM | POA: Diagnosis present

## 2016-09-21 DIAGNOSIS — K7460 Unspecified cirrhosis of liver: Secondary | ICD-10-CM | POA: Diagnosis present

## 2016-09-21 DIAGNOSIS — J441 Chronic obstructive pulmonary disease with (acute) exacerbation: Secondary | ICD-10-CM | POA: Diagnosis not present

## 2016-09-21 DIAGNOSIS — I10 Essential (primary) hypertension: Secondary | ICD-10-CM | POA: Diagnosis present

## 2016-09-21 DIAGNOSIS — Z794 Long term (current) use of insulin: Secondary | ICD-10-CM | POA: Diagnosis not present

## 2016-09-21 DIAGNOSIS — E869 Volume depletion, unspecified: Secondary | ICD-10-CM | POA: Diagnosis present

## 2016-09-21 DIAGNOSIS — Z7982 Long term (current) use of aspirin: Secondary | ICD-10-CM | POA: Diagnosis not present

## 2016-09-21 DIAGNOSIS — J9622 Acute and chronic respiratory failure with hypercapnia: Secondary | ICD-10-CM | POA: Diagnosis not present

## 2016-09-21 DIAGNOSIS — J9621 Acute and chronic respiratory failure with hypoxia: Secondary | ICD-10-CM

## 2016-09-21 DIAGNOSIS — Z9119 Patient's noncompliance with other medical treatment and regimen: Secondary | ICD-10-CM | POA: Diagnosis not present

## 2016-09-21 DIAGNOSIS — N189 Chronic kidney disease, unspecified: Secondary | ICD-10-CM | POA: Diagnosis not present

## 2016-09-21 DIAGNOSIS — I361 Nonrheumatic tricuspid (valve) insufficiency: Secondary | ICD-10-CM | POA: Diagnosis not present

## 2016-09-21 DIAGNOSIS — Z87891 Personal history of nicotine dependence: Secondary | ICD-10-CM | POA: Diagnosis not present

## 2016-09-21 DIAGNOSIS — J9620 Acute and chronic respiratory failure, unspecified whether with hypoxia or hypercapnia: Secondary | ICD-10-CM | POA: Insufficient documentation

## 2016-09-21 DIAGNOSIS — Z6841 Body Mass Index (BMI) 40.0 and over, adult: Secondary | ICD-10-CM | POA: Diagnosis not present

## 2016-09-21 DIAGNOSIS — G8929 Other chronic pain: Secondary | ICD-10-CM | POA: Diagnosis not present

## 2016-09-21 DIAGNOSIS — Z803 Family history of malignant neoplasm of breast: Secondary | ICD-10-CM | POA: Diagnosis not present

## 2016-09-21 DIAGNOSIS — N179 Acute kidney failure, unspecified: Secondary | ICD-10-CM | POA: Diagnosis not present

## 2016-09-21 DIAGNOSIS — E1122 Type 2 diabetes mellitus with diabetic chronic kidney disease: Secondary | ICD-10-CM | POA: Diagnosis present

## 2016-09-21 LAB — BLOOD GAS, ARTERIAL
ACID-BASE EXCESS: 3.8 mmol/L — AB (ref 0.0–2.0)
Acid-Base Excess: 5 mmol/L — ABNORMAL HIGH (ref 0.0–2.0)
BICARBONATE: 26.3 mmol/L (ref 20.0–28.0)
Bicarbonate: 26.4 mmol/L (ref 20.0–28.0)
Delivery systems: POSITIVE
Delivery systems: POSITIVE
Drawn by: 22223
Drawn by: 234301
EXPIRATORY PAP: 5
Expiratory PAP: 5
FIO2: 40
FIO2: 40
INSPIRATORY PAP: 16
Inspiratory PAP: 16
MODE: POSITIVE
O2 SAT: 95.5 %
O2 SAT: 96.9 %
PCO2 ART: 73.6 mmHg — AB (ref 32.0–48.0)
PH ART: 7.173 — AB (ref 7.350–7.450)
PH ART: 7.244 — AB (ref 7.350–7.450)
PO2 ART: 92.1 mmHg (ref 83.0–108.0)
RATE: 12 resp/min
pCO2 arterial: 93.9 mmHg (ref 32.0–48.0)
pO2, Arterial: 96.3 mmHg (ref 83.0–108.0)

## 2016-09-21 LAB — ECHOCARDIOGRAM COMPLETE
E decel time: 264 msec
EERAT: 8.55
FS: 32 % (ref 28–44)
HEIGHTINCHES: 75 in
IVS/LV PW RATIO, ED: 0.89
LA diam index: 1.42 cm/m2
LASIZE: 46 mm
LAVOLA4C: 60.5 mL
LEFT ATRIUM END SYS DIAM: 46 mm
LVEEAVG: 8.55
LVEEMED: 8.55
LVELAT: 15.2 cm/s
LVOT SV: 105 mL
LVOT VTI: 30.4 cm
LVOT area: 3.46 cm2
LVOT diameter: 21 mm
LVOT peak grad rest: 7 mmHg
LVOTPV: 132 cm/s
Lateral S' vel: 13.7 cm/s
MV Dec: 264
MV pk A vel: 96.5 m/s
MVPG: 7 mmHg
MVPKEVEL: 130 m/s
PW: 11.5 mm — AB (ref 0.6–1.1)
TDI e' lateral: 15.2
TDI e' medial: 14.8
WEIGHTICAEL: 6613.8 [oz_av]

## 2016-09-21 LAB — BASIC METABOLIC PANEL
Anion gap: 10 (ref 5–15)
Anion gap: 9 (ref 5–15)
BUN: 95 mg/dL — AB (ref 6–20)
BUN: 120 mg/dL — AB (ref 6–20)
CHLORIDE: 97 mmol/L — AB (ref 101–111)
CHLORIDE: 97 mmol/L — AB (ref 101–111)
CO2: 31 mmol/L (ref 22–32)
CO2: 31 mmol/L (ref 22–32)
Calcium: 7.9 mg/dL — ABNORMAL LOW (ref 8.9–10.3)
Calcium: 8 mg/dL — ABNORMAL LOW (ref 8.9–10.3)
Creatinine, Ser: 4.34 mg/dL — ABNORMAL HIGH (ref 0.61–1.24)
Creatinine, Ser: 4.03 mg/dL — ABNORMAL HIGH (ref 0.61–1.24)
GFR calc Af Amer: 17 mL/min — ABNORMAL LOW (ref >60)
GFR calc Af Amer: 18 mL/min — ABNORMAL LOW (ref >60)
GFR calc non Af Amer: 14 mL/min — ABNORMAL LOW (ref >60)
GFR calc non Af Amer: 16 mL/min — ABNORMAL LOW (ref >60)
Glucose, Bld: 171 mg/dL — ABNORMAL HIGH (ref 65–99)
Glucose, Bld: 238 mg/dL — ABNORMAL HIGH (ref 65–99)
POTASSIUM: 7.2 mmol/L — AB (ref 3.5–5.1)
POTASSIUM: 5.9 mmol/L — AB (ref 3.5–5.1)
SODIUM: 138 mmol/L (ref 135–145)
Sodium: 137 mmol/L (ref 135–145)

## 2016-09-21 LAB — PROTIME-INR
INR: 1.02
Prothrombin Time: 13.4 seconds (ref 11.4–15.2)

## 2016-09-21 LAB — MRSA PCR SCREENING: MRSA by PCR: NEGATIVE

## 2016-09-21 LAB — LACTIC ACID, PLASMA
LACTIC ACID, VENOUS: 0.7 mmol/L (ref 0.5–1.9)
LACTIC ACID, VENOUS: 0.8 mmol/L (ref 0.5–1.9)

## 2016-09-21 LAB — GLUCOSE, CAPILLARY
GLUCOSE-CAPILLARY: 155 mg/dL — AB (ref 65–99)
GLUCOSE-CAPILLARY: 229 mg/dL — AB (ref 65–99)
Glucose-Capillary: 164 mg/dL — ABNORMAL HIGH (ref 65–99)
Glucose-Capillary: 171 mg/dL — ABNORMAL HIGH (ref 65–99)
Glucose-Capillary: 234 mg/dL — ABNORMAL HIGH (ref 65–99)

## 2016-09-21 LAB — TROPONIN I
TROPONIN I: 0.72 ng/mL — AB (ref <0.03)
TROPONIN I: 0.78 ng/mL — AB (ref <0.03)
TROPONIN I: 0.67 ng/mL — AB (ref <0.03)

## 2016-09-21 LAB — APTT: aPTT: 30 seconds (ref 24–36)

## 2016-09-21 LAB — CBG MONITORING, ED: Glucose-Capillary: 113 mg/dL — ABNORMAL HIGH (ref 65–99)

## 2016-09-21 LAB — GLUCOSE, RANDOM: Glucose, Bld: 145 mg/dL — ABNORMAL HIGH (ref 65–99)

## 2016-09-21 LAB — AMMONIA: Ammonia: 59 umol/L — ABNORMAL HIGH (ref 9–35)

## 2016-09-21 LAB — POTASSIUM: Potassium: 6 mmol/L — ABNORMAL HIGH (ref 3.5–5.1)

## 2016-09-21 MED ORDER — ALBUTEROL SULFATE (2.5 MG/3ML) 0.083% IN NEBU: 2.5000 mg | INHALATION_SOLUTION | RESPIRATORY_TRACT | Status: DC | PRN

## 2016-09-21 MED ORDER — ONDANSETRON HCL 4 MG/2ML IJ SOLN: 4.0000 mg | Freq: Four times a day (QID) | INTRAMUSCULAR | Status: DC | PRN

## 2016-09-21 MED ORDER — SODIUM POLYSTYRENE SULFONATE 15 GM/60ML PO SUSP
15.0000 g | Freq: Once | ORAL | Status: AC
  Administered 2016-09-21: 15 g via ORAL
  Filled 2016-09-21: qty 60

## 2016-09-21 MED ORDER — PERFLUTREN LIPID MICROSPHERE
1.0000 mL | INTRAVENOUS | Status: AC | PRN
Start: 2016-09-21 — End: 2016-09-21
  Administered 2016-09-21: 2 mL via INTRAVENOUS
  Administered 2016-09-21: 1 mL via INTRAVENOUS
  Filled 2016-09-21: qty 10

## 2016-09-21 MED ORDER — IPRATROPIUM BROMIDE 0.02 % IN SOLN: 0.5000 mg | Freq: Four times a day (QID) | RESPIRATORY_TRACT | Status: DC

## 2016-09-21 MED ORDER — SODIUM CHLORIDE 0.9 % IV SOLN
INTRAVENOUS | Status: DC
  Administered 2016-09-21 – 2016-09-24 (×3): via INTRAVENOUS

## 2016-09-21 MED ORDER — SODIUM CHLORIDE 0.9 % IV BOLUS (SEPSIS)
500.0000 mL | Freq: Once | INTRAVENOUS | Status: AC
  Administered 2016-09-21: 500 mL via INTRAVENOUS

## 2016-09-21 MED ORDER — SODIUM CHLORIDE 0.9 % IV SOLN
INTRAVENOUS | Status: AC
  Filled 2016-09-21: qty 10

## 2016-09-21 MED ORDER — INSULIN ASPART 100 UNIT/ML ~~LOC~~ SOLN
0.0000 [IU] | SUBCUTANEOUS | Status: DC
  Administered 2016-09-21 (×4): 2 [IU] via SUBCUTANEOUS
  Administered 2016-09-21: 3 [IU] via SUBCUTANEOUS
  Administered 2016-09-22 (×2): 1 [IU] via SUBCUTANEOUS
  Administered 2016-09-22: 3 [IU] via SUBCUTANEOUS
  Administered 2016-09-22 (×3): 1 [IU] via SUBCUTANEOUS
  Administered 2016-09-22 – 2016-09-23 (×3): 2 [IU] via SUBCUTANEOUS

## 2016-09-21 MED ORDER — NALOXONE HCL 0.4 MG/ML IJ SOLN: 0.4000 mg | Freq: Once | INTRAMUSCULAR | Status: DC

## 2016-09-21 MED ORDER — ALBUTEROL (5 MG/ML) CONTINUOUS INHALATION SOLN
10.0000 mg/h | INHALATION_SOLUTION | Freq: Once | RESPIRATORY_TRACT | Status: AC
  Administered 2016-09-21: 10 mg/h via RESPIRATORY_TRACT
  Filled 2016-09-21: qty 20

## 2016-09-21 MED ORDER — IPRATROPIUM-ALBUTEROL 0.5-2.5 (3) MG/3ML IN SOLN
3.0000 mL | Freq: Four times a day (QID) | RESPIRATORY_TRACT | Status: DC
  Administered 2016-09-21 – 2016-09-23 (×11): 3 mL via RESPIRATORY_TRACT
  Filled 2016-09-21 (×11): qty 3

## 2016-09-21 MED ORDER — TRAMADOL HCL 50 MG PO TABS
50.0000 mg | ORAL_TABLET | Freq: Four times a day (QID) | ORAL | Status: DC | PRN
  Administered 2016-09-21 – 2016-09-22 (×2): 50 mg via ORAL
  Filled 2016-09-21 (×2): qty 1

## 2016-09-21 MED ORDER — ONDANSETRON HCL 4 MG PO TABS: 4.0000 mg | ORAL_TABLET | Freq: Four times a day (QID) | ORAL | Status: DC | PRN

## 2016-09-21 MED ORDER — INSULIN ASPART 100 UNIT/ML ~~LOC~~ SOLN
8.0000 [IU] | Freq: Once | SUBCUTANEOUS | Status: AC
  Administered 2016-09-21: 8 [IU] via SUBCUTANEOUS

## 2016-09-21 MED ORDER — SODIUM CHLORIDE 0.9 % IV SOLN
INTRAVENOUS | Status: DC
  Administered 2016-09-21: 800 mL via INTRAVENOUS
  Administered 2016-09-21: 1000 mL via INTRAVENOUS
  Administered 2016-09-22 – 2016-09-23 (×2): via INTRAVENOUS

## 2016-09-21 MED ORDER — ALBUTEROL SULFATE (2.5 MG/3ML) 0.083% IN NEBU: 2.5000 mg | INHALATION_SOLUTION | Freq: Four times a day (QID) | RESPIRATORY_TRACT | Status: DC

## 2016-09-21 MED ORDER — METHYLPREDNISOLONE SODIUM SUCC 125 MG IJ SOLR
125.0000 mg | Freq: Once | INTRAMUSCULAR | Status: AC
  Administered 2016-09-21: 125 mg via INTRAVENOUS
  Filled 2016-09-21: qty 2

## 2016-09-21 MED ORDER — ENOXAPARIN SODIUM 40 MG/0.4ML ~~LOC~~ SOLN
40.0000 mg | SUBCUTANEOUS | Status: DC
  Administered 2016-09-21 – 2016-09-24 (×4): 40 mg via SUBCUTANEOUS
  Filled 2016-09-21 (×4): qty 0.4

## 2016-09-21 MED ORDER — METHYLPREDNISOLONE SODIUM SUCC 125 MG IJ SOLR
80.0000 mg | Freq: Two times a day (BID) | INTRAMUSCULAR | Status: DC
  Administered 2016-09-21 – 2016-09-22 (×3): 80 mg via INTRAVENOUS
  Filled 2016-09-21 (×3): qty 2

## 2016-09-21 MED ORDER — DEXTROSE 50 % IV SOLN
1.0000 | Freq: Once | INTRAVENOUS | Status: AC
  Administered 2016-09-21: 50 mL via INTRAVENOUS

## 2016-09-21 MED ORDER — SODIUM CHLORIDE 0.9 % IV SOLN
1.0000 g | Freq: Once | INTRAVENOUS | Status: AC
  Administered 2016-09-21: 1 g via INTRAVENOUS
  Filled 2016-09-21: qty 10

## 2016-09-21 NOTE — Clinical Social Work Note (Signed)
Clinical Social Work Assessment  Patient Details  Name: Jorge [name] MRN: [id number] Date of Birth: [birth year]  Date of referral:  09/21/16               Reason for consult:  Discharge Planning                Permission sought to share information with:  Case Manager, Facility Sport and exercise psychologist, Family Supports Permission granted to share information::  Yes, Verbal Permission Granted  Name::        Agency::  [facility]  Relationship::  Brother  Contact Information:     Housing/Transportation Living arrangements for the past 2 months:  [place] Addition of Information:  Patient, Medical Team, Tourist information centre manager, Facility Patient Interpreter Needed:  None Criminal Activity/Legal Involvement Pertinent to Current Situation/Hospitalization:  No - Comment as needed Significant Relationships:  Other Family Members Lives with:  Facility Resident Do you feel safe going back to the place where you live?  Yes Need for family participation in patient care:  No ([place]ent)  Care giving concerns:  Patient is a LTC resident at [name] and has been at facility for three years.   Social Worker assessment / plan:  No changes in previous assessment completed by [place] with [facility].  Patient plans to return.  Patient's FL2 updated.  Will support patient returning to SNF.  CSW met with pt at bedside. Pt alert and oriented and has been a resident at [facility] for about a year. He reports his brother is his best support and visits every Sunday at facility. Pt has been trying to get in touch with him but has been unable to reach him. RN in ICU called [facility] and no contact information on their chart for brother. Pt uses a wheelchair at baseline. Pt had legal guardian listed in chart. However, per [facility] pt makes own decisions and does not have legal guardian. Debbie at [facility] indicates pt is nursing level of care and okay to return.    Employment status:  Disabled (Comment on whether or not currently  receiving Disability) Insurance information:  Programmer, applications, Medicaid In [place] PT Recommendations:  Not assessed at this time Information / Referral to community resources:     Patient/Family's Response to care:  Agreeable no barriers  Patient/Family's Understanding of and Emotional Response to Diagnosis, Current Treatment, and Prognosis:  Still assessing.  Patient currently on Bipap and unable to complete full history. Will continue to assist and answer questions regarding admission as they arise.  Emotional Assessment Appearance:  Appears stated age Attitude/Demeanor/Rapport:    Affect (typically observed):  Accepting, Adaptable Orientation:  Oriented to Self, Oriented to Place, Oriented to  Time, Oriented to Situation Alcohol / Substance use:  Not Applicable Psych involvement (Current and /or in the community):  No (Comment)  Discharge Needs  Concerns to be addressed:  No discharge needs identified Readmission within the last 30 days:  No Current discharge risk:  None Barriers to Discharge:  No Barriers Identified   [name], LCSW 09/21/2016, 1:06 PM

## 2016-09-21 NOTE — ED Notes (Signed)
Date and time results received: 09/21/16 0214 (use smartphrase ".now" to insert current time)  Test: abg Critical Value: ph 7.173 co2 93.9 po2 92.1 Bicarb 26.3 Sat 95.5  Name of Provider Notified: dr Regenia Skeeter  Orders Received? Or Actions Taken?: Actions Taken: no orders received

## 2016-09-21 NOTE — Care Management Note (Signed)
Case Management Note  Patient Details  Name: Jorge [name] [facility] MRN: [id number] Date of Birth: [birth year]  Subjective/Objective:   Adm with acute on chronic respiratory failure with hypoxia. From [facility]. On BiPap currently.                  Action/Plan: Anticipate return to [facility] at time of discharge. CSW aware of admission.   Expected Discharge Date:      09/23/2016            Expected Discharge Plan:  Skilled Nursing Facility  In-House Referral:  Clinical Social Work  Discharge planning Services  CM Consult  Post Acute Care Choice:    Choice offered to:     DME Arranged:    DME Agency:     HH Arranged:    [place] Agency:     Status of Service:  In process, will continue to follow  If discussed at Long Length of Stay Meetings, dates discussed:    Additional Comments:  Ellard Nan, Chauncey Reading, RN 09/21/2016, 12:43 PM

## 2016-09-21 NOTE — Progress Notes (Signed)
Follow up note from earlier admission today:  51 yo SNF with hx of COPD, cirrhosis, chronic pain on narcotics, DM, CKD with baseline Cr 1.5, previously DNR but now FULL CODE, admitted for chest pain and AMS.  He was found to be in respiratory failure with ABG showing pH of 7.1 and pCO2 of 96.  He responded of IV Narcan x 2, and is currently more awake on Bipap. CXR showed no infiltrate and no CHF.  He also was found to have Cr of 4, previously on Demadex and Vasotec.  His ECHO was 65% a year ago. He is alert, no significant wheezing or rales on exam. WIll continue with Bipap today, IV steroids, and nebs.  Avoid Narcotics.  Will need to obtain repeat ECHO. Would hold ACE I and diuretics, and give IVF, with following Cr. I have asked Dr Lowanda Foster to see him if his Cr doesn't improve.   Orvan Falconer MD FACP. Hospitalist.

## 2016-09-21 NOTE — ED Notes (Signed)
CRITICAL VALUE ALERT  Critical value received:  Trop 0.74  Date of notification:  09/20/2016  Time of notification:  2344  Critical value read back: yes  Nurse who received alert:  Kendell Bane rn  MD notified (1st page):  [place]  Time of first page:  2345  MD notified (2nd page):  Time of second page:  Responding MD:  Regenia Skeeter   Time MD responded:  [phone]

## 2016-09-21 NOTE — Progress Notes (Signed)
*  PRELIMINARY RESULTS* Echocardiogram 2D Echocardiogram has been performed with [facility].  Samuel Germany 09/21/2016, 3:57 PM

## 2016-09-21 NOTE — H&P (Signed)
History and Physical    Jorge [name] JYN:[id number] DOB: 29-Aug-1965 DOA: 09/20/2016  PCP: Hilbert Corrigan, MD  Patient coming from:  SNF  Chief Complaint:  Unknown, sent from SNF for AMS, chest pain  HPI: Jorge [name] is a 51 y.o. male with medical history significant of COPD, cirrhosis, chronic pain, DM, CKD comes in from SNF with DNR paperwork for AMS and complaints of chest pain.  Pt was given narcan in the ED and woke up some.  He is on bipap and history is limited.  Pt has resended his DNR and wishes to be full code.  He cannot provide anything that is reliable.  He denies any cough or fevers.  Says his chest hurts.  He is very drowsy.  Over the last couple of hours in the ED he has been on bipap.  Suppose to be on bipap qhs at SNF, unknown if this is the case.   Review of Systems: unobtainable due to AMS  Past Medical History:  Diagnosis Date  . Cirrhosis ([place]) sleep apnea  . COPD (chronic obstructive pulmonary disease) ([place])   . Diabetes mellitus without complication ([place]   . Hypertension   . Renal disorder    stage II  . Renal insufficiency   . Sleep apnea   . Suicide attempt ([place])   . Syncope   . Tobacco abuse     Past Surgical History:  Procedure Laterality Date  . CHOLECYSTECTOMY    . LEG SURGERY Left      reports that he quit smoking about 10 months ago. His smoking use included Cigarettes. He smoked 0.50 packs per day. He has never used smokeless tobacco. He reports that he does not drink alcohol or use drugs.  Allergies  Allergen Reactions  . Penicillins Anaphylaxis    Patient states he is not allergic to anything;  Pt says it's his twin brother that has this allergy.    Family History  Problem Relation Age of Onset  . Heart disease Father   . Cancer Mother        breast    Prior to Admission medications   Medication Sig Start Date End Date Taking? Authorizing Provider  ARIPiprazole (ABILIFY) 20 MG tablet Take 20 mg by mouth daily.    Yes [provider]  aspirin 81 MG tablet Take 81 mg by mouth daily.   Yes [provider]  atenolol (TENORMIN) 25 MG tablet Take 25 mg by mouth daily.    Yes [provider]  busPIRone (BUSPAR) 10 MG tablet Take 10 mg by mouth 2 (two) times daily.   Yes [provider]  busPIRone (BUSPAR) 7.5 MG tablet Take 7.5 mg by mouth 2 (two) times daily. 3 day dose to be completed 09/22/2016   Yes [provider]  calcitRIOL (ROCALTROL) 0.25 MCG capsule Take 1 capsule (0.25 mcg total) by mouth 2 (two) times daily with a meal. 08/03/16  Yes Nida, Marella Chimes, MD  calcium citrate (CALCITRATE - DOSED IN MG ELEMENTAL CALCIUM) 950 MG tablet Take 1 tablet by mouth daily.   Yes [provider]  cholecalciferol (VITAMIN D) 1000 UNITS tablet Take 1,000 Units by mouth daily. Reported on 05/03/2015   Yes [provider]  citalopram (CELEXA) 40 MG tablet Take 40 mg by mouth at bedtime.  12/03/13  Yes [provider]  diphenhydrAMINE (BENADRYL) 25 MG tablet Take 25 mg by mouth at bedtime as needed for sleep.   Yes [provider]  enalapril (VASOTEC) 2.5 MG tablet Take 2.5 mg by mouth daily.   Yes [provider]  Fe Bisgly-Succ-C-Thre-B12-FA (IRON-150 PO) Take 1 tablet by mouth daily.   Yes [provider]  fluticasone furoate-vilanterol (BREO ELLIPTA) 100-25 MCG/INH AEPB Inhale 1 puff into the lungs daily.   Yes [provider]  gabapentin (NEURONTIN) 300 MG capsule Take 300 mg by mouth 3 (three) times daily. 12/03/13  Yes [provider]  guaiFENesin (MUCINEX) 600 MG 12 hr tablet Take 600 mg by mouth 2 (two) times daily.    Yes [provider]  HYDROcodone-acetaminophen (NORCO) 10-325 MG tablet Take 1 tablet by mouth every 6 (six) hours.    Yes [provider]  insulin aspart (NOVOLOG FLEXPEN) 100 UNIT/ML FlexPen Inject 15-20 Units into the skin 4 (four) times daily -  before meals and  at bedtime. 15 units twice daily at 730a and 1130a, then take 20 units at 1700 in addition to sliding scale  150-200=2units Call np/md if less than 70 201-250=4units 251-300=6units 301-350=8units 351-400=10units Greater than 400 call np and md   Yes [provider]  Insulin Glargine (LANTUS SOLOSTAR) 100 UNIT/ML Solostar Pen Inject 60 Units into the skin 2 (two) times daily.    Yes [provider]  ipratropium-albuterol (DUONEB) 0.5-2.5 (3) MG/3ML SOLN Take 3 mLs by nebulization 3 (three) times daily. Patient taking differently: Take 3 mLs by nebulization 3 (three) times daily. *May also take every 4 hours as needed for shortness of breath 11/01/14  Yes Fanta, Tesfaye, MD  lactulose, encephalopathy, (CHRONULAC) 10 GM/15ML SOLN Take 14.5 g by mouth 3 (three) times daily. 10mls 3 times daily   Yes [provider]  levothyroxine (SYNTHROID, LEVOTHROID) 25 MCG tablet Take 25 mcg by mouth daily before breakfast.   Yes [provider]  Melatonin 5 MG CAPS Take 1 capsule by mouth at bedtime.   Yes [provider]  metolazone (ZAROXOLYN) 2.5 MG tablet Take 2.5 mg by mouth every Monday, Wednesday, and Friday.   Yes [provider]  nicotine (NICODERM CQ - DOSED IN MG/24 HOURS) 14 mg/24hr patch Place 14 mg onto the skin daily.   Yes [provider]  nitroGLYCERIN (NITROSTAT) 0.4 MG SL tablet Place 0.4 mg under the tongue every 5 (five) minutes as needed for chest pain.   Yes [provider]  omeprazole (PRILOSEC) 20 MG capsule Take 20 mg by mouth daily.   Yes [provider]  OXYGEN Inhale 3 L into the lungs daily as needed (for breathing).    Yes [provider]  polyethylene glycol (MIRALAX / GLYCOLAX) packet Take 17 g by mouth daily as needed for mild constipation or moderate constipation.    Yes [provider]  rifaximin (XIFAXAN) 200 MG tablet Take 200 mg by mouth daily.   Yes [provider]    sennosides-docusate sodium (SENOKOT-S) 8.6-50 MG tablet Take 1 tablet by mouth daily.    Yes [provider]  sitaGLIPtin (JANUVIA) 25 MG tablet Take 25 mg by mouth daily.   Yes [provider]  torsemide (DEMADEX) 20 MG tablet Take 40 mg by mouth daily.    Yes [provider]  traZODone (DESYREL) 50 MG tablet Take 150 mg by mouth at bedtime.   Yes [provider]  Vitamin D, Ergocalciferol, (DRISDOL) 50000 units CAPS capsule Take 50,000 Units by mouth every 30 (thirty) days.   Yes [provider]  cyclobenzaprine (FLEXERIL) 10 MG tablet Take 1 tablet (10  mg total) by mouth 3 (three) times daily as needed (muscle soreness). 06/15/16   Rolland Porter, MD  PROAIR HFA 108 (90 BASE) MCG/ACT inhaler Inhale 2 puffs into the lungs every 6 (six) hours as needed for wheezing or shortness of breath. Shortness of breath/wheeze 12/05/13   [provider]    Physical Exam: Vitals:   09/21/16 0100 09/21/16 0110 09/21/16 0116 09/21/16 0200  BP: (!) 100/49 (!) 90/36  (!) 105/47  Pulse: 61 (!) 57    Resp: 16 17  14   Temp:      TempSrc:      SpO2: 100% 90% 94%   Weight:      Height:          Constitutional: NAD, calm, comfortable Vitals:   09/21/16 0100 09/21/16 0110 09/21/16 0116 09/21/16 0200  BP: (!) 100/49 (!) 90/36  (!) 105/47  Pulse: 61 (!) 57    Resp: 16 17  14   Temp:      TempSrc:      SpO2: 100% 90% 94%   Weight:      Height:       Eyes: PERRL, lids and conjunctivae normal ENMT: Mucous membranes are moist. Posterior pharynx clear of any exudate or lesions.Normal dentition.  Neck: normal, supple, no masses, no thyromegaly Respiratory: clear to auscultation bilaterally, no wheezing, no crackles. Normal respiratory effort. No accessory muscle use.  Cardiovascular: Regular rate and rhythm, no murmurs / rubs / gallops. No extremity edema. 2+ pedal pulses. No carotid bruits.  Abdomen: no tenderness, no masses palpated. No hepatosplenomegaly.  Bowel sounds positive.  Musculoskeletal: no clubbing / cyanosis. No joint deformity upper and lower extremities. Good ROM, no contractures. Normal muscle tone.  Skin: no rashes, lesions, ulcers. No induration Neurologic: CN 2-12 grossly intact. Sensation intact, DTR normal. Strength 5/5 in all 4.  Psychiatric: Normal judgment and insight. Alert and oriented x 3. Normal mood.    Labs on Admission: I have personally reviewed following labs and imaging studies  CBC:  Recent Labs Lab 09/20/16 2300  WBC 7.2  HGB 10.4*  HCT 34.2*  MCV 116.7*  PLT 79*   Basic Metabolic Panel:  Recent Labs Lab 09/20/16 2300 09/21/16 0157  NA 137  --   K 6.2* 6.0*  CL 94*  --   CO2 34*  --   GLUCOSE 129* 145*  BUN 107*  --   CREATININE 4.59*  --   CALCIUM 8.2*  --    GFR: Estimated Creatinine Clearance: 32.1 mL/min (A) (by C-G formula based on SCr of 4.59 mg/dL (H)). Liver Function Tests:  Recent Labs Lab 09/20/16 2300  AST 19  ALT 20  ALKPHOS 71  BILITOT 0.7  PROT 7.1  ALBUMIN 3.4*    Recent Labs Lab 09/20/16 2300  LIPASE 32    Recent Labs Lab 09/21/16 0157  AMMONIA 59*   Coagulation Profile:  Recent Labs Lab 09/20/16 2300  INR 1.02   Cardiac Enzymes:  Recent Labs Lab 09/20/16 2300  TROPONINI 0.74*   BNP (last 3 results) No results for input(s): PROBNP in the last 8760 hours. HbA1C: No results for input(s): HGBA1C in the last 72 hours. CBG:  Recent Labs Lab 09/21/16 0016  GLUCAP 113*   Lipid Profile: No results for input(s): CHOL, HDL, LDLCALC, TRIG, CHOLHDL, LDLDIRECT in the last 72 hours. Thyroid Function Tests: No results for input(s): TSH, T4TOTAL, FREET4, T3FREE, THYROIDAB in the last 72 hours. Anemia Panel: No results for input(s): VITAMINB12, FOLATE, FERRITIN, TIBC,  IRON, RETICCTPCT in the last 72 hours. Urine analysis:    Component Value Date/Time   COLORURINE YELLOW 09/10/2015 2121   APPEARANCEUR CLEAR 09/10/2015 2121   APPEARANCEUR  Clear 03/25/2013 1630   LABSPEC 1.010 09/10/2015 2121   LABSPEC 1.023 03/25/2013 1630   PHURINE 6.0 09/10/2015 2121   GLUCOSEU 500 (A) 09/10/2015 2121   GLUCOSEU Negative 03/25/2013 1630   HGBUR NEGATIVE 09/10/2015 2121   BILIRUBINUR NEGATIVE 09/10/2015 2121   BILIRUBINUR Negative 03/25/2013 [place] 09/10/2015 2121   PROTEINUR NEGATIVE 09/10/2015 2121   UROBILINOGEN 4.0 (H) 12/21/2014 1618   NITRITE NEGATIVE 09/10/2015 2121   LEUKOCYTESUR NEGATIVE 09/10/2015 2121   LEUKOCYTESUR Negative 03/25/2013 1630   Sepsis Labs: !!!!!!!!!!!!!!!!!!!!!!!!!!!!!!!!!!!!!!!!!!!! @LABRCNTIP (procalcitonin:4,lacticidven:4) )No results found for this or any previous visit (from the past 240 hour(s)).   Radiological Exams on Admission: Dg Chest Portable 1 View  Result Date: 09/21/2016 CLINICAL DATA:  Chest pain and shortness of breath. History of hypertension, COPD, diabetes, former smoker. EXAM: PORTABLE CHEST 1 VIEW COMPARISON:  06/14/2016 FINDINGS: Examination is somewhat limited due to patient positioning. Shallow inspiration. Cardiac enlargement without vascular congestion. No focal consolidation in the lungs. No blunting of costophrenic angles. No pneumothorax. IMPRESSION: No evidence of active pulmonary disease.  Cardiac enlargement. Electronically Signed   By: Lucienne Capers M.D.   On: 09/21/2016 00:21    ekg reviewed nsr no acute issues Old chart reviewed Case discussed with edp cxr reviewed no edema or infiltrate  Assessment/Plan 51 yo male with acute on chronic hypoxic and hypercapneic respiratory failure  Principal Problem:   Acute on chronic respiratory failure with hypoxia ([place])- this is likely multifactorial.  Responding to narcan.  Pt does not appear volume overloaded.  Give another dose of narcan.  Given small boluses of ivf, cont gentle ivf .  bipap in the stepdown overnight.  Treat for possible copde.  No pna on cxr.  Repeat abg later this am.    Active  Problems:   COPD exacerbation ([place])- iv solumedrol.  freq nebs   CKD (chronic kidney disease) stage 3, GFR 30-59 ml/min- cr up to over 4 baseline around 1.5. ua is pending.  Pt is dry and volume depleted.  Unknown what history has been occurring at SNF   Chronic pain- noted, holding all sedatives, narcan prn   Sleep apnea- noted   Pt is changing his code status   DVT prophylaxis:  scds  Code Status:   Full code Family Communication:  none Disposition Plan:  Per day team Consults called:  none Admission status:  admission   Milynn Quirion A MD [facility]  If 7PM-7AM, please contact night-coverage [email or web address] Password [email or web address]  09/21/2016, 4:23 AM

## 2016-09-21 NOTE — NC FL2 (Signed)
[place] LEVEL OF CARE SCREENING TOOL     IDENTIFICATION  Patient Name: Jorge [name] Birthdate: [birth year] Sex: male Admission Date (Current Location): 09/20/2016  [facility] and [place] Number:  [employer] and Address:  [facility] [address] [place]      Provider Number: [id number]  Attending Physician Name and Address:  Orvan Falconer, MD  Relative Name and Phone Number:       Current Level of Care: Hospital Recommended Level of Care: Nursing Facility Prior Approval Number:    Date Approved/Denied:   PASRR Number:    Discharge Plan: SNF    Current Diagnoses: Patient Active Problem List   Diagnosis Date Noted  . Respiratory failure, acute and chronic ([place]) 09/21/2016  . Other hyperparathyroidism ([place]) 08/03/2016  . Hypocalcemia 08/03/2016  . DNR (do not resuscitate) 12/13/2015  . Diabetes mellitus ([place]) 12/13/2015  . Pressure ulcer 12/23/2014  . Syncope 12/23/2014  . Anemia 12/21/2014  . Thrombocytopenia ([place]) 10/27/2014  . Hyperglycemia 10/27/2014  . COPD with exacerbation ([place]) 10/27/2014  . Acute on chronic respiratory failure with hypoxia ([place]) 10/27/2014  . Sleep apnea   . Tobacco abuse   . Insomnia 10/03/2014  . Chronic pain 10/03/2014  . GERD without esophagitis 10/03/2014  . Chronic obstructive pulmonary disease with acute exacerbation ([place])   . Renal insufficiency   . Pain in the chest   . Essential hypertension   . COPD exacerbation ([place]) 08/29/2014  . Chest pain 08/29/2014  . CKD (chronic kidney disease) stage 3, GFR 30-59 ml/min 08/29/2014  . Acute respiratory failure with hypoxia ([place]) 08/29/2014  . Cirrhosis ([place])   . Hypertension     Orientation RESPIRATION BLADDER Height & Weight     Self, Time, Situation, Place  Other (Comment) (currently on BIPAP, still assessing, see dc) Incontinent Weight: (!) 413 lb 5.8 oz (187.5 kg) Height:  6\' 3"  (190.5 cm)  BEHAVIORAL SYMPTOMS/MOOD  NEUROLOGICAL BOWEL NUTRITION STATUS      Incontinent Diet (see dc summary)  AMBULATORY STATUS COMMUNICATION OF NEEDS Skin   Extensive Assist Verbally Skin abrasions (area to leg dehisced from old would from broken leg )                       Personal Care Assistance Level of Assistance  Bathing, Feeding, Dressing Bathing Assistance: Maximum assistance Feeding assistance: Independent Dressing Assistance: Maximum assistance     Functional Limitations Info  Sight, Hearing, Speech Sight Info: Adequate Hearing Info: Adequate Speech Info: Adequate    SPECIAL CARE FACTORS FREQUENCY                       Contractures Contractures Info: Not present    Additional Factors Info  Code Status, Allergies, Isolation Precautions Code Status Info: Full Code Allergies Info: Penicillins     Isolation Precautions Info: MRSA     Current Medications (09/21/2016):  This is the current hospital active medication list Current Facility-Administered Medications  Medication Dose Route Frequency Provider Last Rate Last Dose  . 0.9 %  sodium chloride infusion   Intravenous Continuous Phillips Grout, MD 100 mL/hr at 09/21/16 0551    . 0.9 %  sodium chloride infusion   Intravenous Continuous Orvan Falconer, MD 100 mL/hr at 09/21/16 0816 800 mL at 09/21/16 0816  . albuterol (PROVENTIL) (2.5 MG/3ML) 0.083% nebulizer solution 2.5 mg  2.5 mg Nebulization Q2H PRN Phillips Grout, MD      .  enoxaparin (LOVENOX) injection 40 mg  40 mg Subcutaneous Q24H Derrill Kay A, MD   40 mg at 09/21/16 0551  . insulin aspart (novoLOG) injection 0-9 Units  0-9 Units Subcutaneous Q4H Phillips Grout, MD   2 Units at 09/21/16 1249  . ipratropium-albuterol (DUONEB) 0.5-2.5 (3) MG/3ML nebulizer solution 3 mL  3 mL Nebulization Q6H Derrill Kay A, MD   3 mL at 09/21/16 0758  . methylPREDNISolone sodium succinate (SOLU-MEDROL) 125 mg/2 mL injection 80 mg  80 mg Intravenous Q12H Derrill Kay A, MD   80 mg at 09/21/16 0550   . naloxone [facility]) injection 0.4 mg  0.4 mg Intravenous Once Derrill Kay A, MD      . ondansetron [facility]) tablet 4 mg  4 mg Oral Q6H PRN Phillips Grout, MD       Or  . ondansetron (ZOFRAN) injection 4 mg  4 mg Intravenous Q6H PRN Phillips Grout, MD         Discharge Medications: Please see discharge summary for a list of discharge medications.  Relevant Imaging Results:  Relevant Lab Results:   Additional Information SSN: [id number]  [name], [place]

## 2016-09-22 LAB — GLUCOSE, CAPILLARY
GLUCOSE-CAPILLARY: 136 mg/dL — AB (ref 65–99)
GLUCOSE-CAPILLARY: 138 mg/dL — AB (ref 65–99)
GLUCOSE-CAPILLARY: 142 mg/dL — AB (ref 65–99)
GLUCOSE-CAPILLARY: 214 mg/dL — AB (ref 65–99)
Glucose-Capillary: 197 mg/dL — ABNORMAL HIGH (ref 65–99)
Glucose-Capillary: 148 mg/dL — ABNORMAL HIGH (ref 65–99)
Glucose-Capillary: 147 mg/dL — ABNORMAL HIGH (ref 65–99)

## 2016-09-22 LAB — CBC
HEMATOCRIT: 32.8 % — AB (ref 39.0–52.0)
Hemoglobin: 10.5 g/dL — ABNORMAL LOW (ref 13.0–17.0)
MCH: 35.7 pg — AB (ref 26.0–34.0)
MCHC: 32 g/dL (ref 30.0–36.0)
MCV: 111.6 fL — AB (ref 78.0–100.0)
PLATELETS: 80 10*3/uL — AB (ref 150–400)
RBC: 2.94 MIL/uL — AB (ref 4.22–5.81)
RDW: 16 % — ABNORMAL HIGH (ref 11.5–15.5)
WBC: 5.6 10*3/uL (ref 4.0–10.5)

## 2016-09-22 LAB — COMPREHENSIVE METABOLIC PANEL
ALT: 26 U/L (ref 17–63)
AST: 22 U/L (ref 15–41)
Albumin: 3.1 g/dL — ABNORMAL LOW (ref 3.5–5.0)
Alkaline Phosphatase: 65 U/L (ref 38–126)
Anion gap: 8 (ref 5–15)
BUN: 118 mg/dL — AB (ref 6–20)
CHLORIDE: 100 mmol/L — AB (ref 101–111)
CO2: 31 mmol/L (ref 22–32)
CREATININE: 3.34 mg/dL — AB (ref 0.61–1.24)
Calcium: 7.8 mg/dL — ABNORMAL LOW (ref 8.9–10.3)
GFR calc non Af Amer: 20 mL/min — ABNORMAL LOW (ref >60)
GFR, EST AFRICAN AMERICAN: 23 mL/min — AB (ref >60)
Glucose, Bld: 147 mg/dL — ABNORMAL HIGH (ref 65–99)
POTASSIUM: 6.2 mmol/L — AB (ref 3.5–5.1)
SODIUM: 139 mmol/L (ref 135–145)
Total Bilirubin: 0.6 mg/dL (ref 0.3–1.2)
Total Protein: 6.3 g/dL — ABNORMAL LOW (ref 6.5–8.1)

## 2016-09-22 LAB — HIV ANTIBODY (ROUTINE TESTING W REFLEX): HIV Screen 4th Generation wRfx: NONREACTIVE

## 2016-09-22 MED ORDER — SODIUM POLYSTYRENE SULFONATE 15 GM/60ML PO SUSP
30.0000 g | Freq: Once | ORAL | Status: AC
  Administered 2016-09-22: 30 g via ORAL
  Filled 2016-09-22: qty 120

## 2016-09-22 MED ORDER — METHYLPREDNISOLONE SODIUM SUCC 40 MG IJ SOLR
40.0000 mg | Freq: Two times a day (BID) | INTRAMUSCULAR | Status: DC
  Administered 2016-09-22 – 2016-09-23 (×3): 40 mg via INTRAVENOUS
  Filled 2016-09-22 (×3): qty 1

## 2016-09-22 NOTE — Progress Notes (Signed)
Inpatient Diabetes Program Recommendations  AACE/ADA: New Consensus Statement on Inpatient Glycemic Control (2015)  Target Ranges:  Prepandial:   less than 140 mg/dL      Peak postprandial:   less than 180 mg/dL (1-2 hours)      Critically ill patients:  140 - 180 mg/dL   Results for Jorge [name], Jorge [name] (MRN [id number]) as of 09/22/2016 08:39  Ref. Range 09/21/2016 07:36 09/21/2016 11:32 09/21/2016 16:26 09/21/2016 19:51 09/22/2016 00:10 09/22/2016 04:06 09/22/2016 07:24  Glucose-Capillary Latest Ref Range: 65 - 99 mg/dL 164 (H) 171 (H) 234 (H) 229 (H) 142 (H) 136 (H) 138 (H)   Review of Glycemic Control  Diabetes history: DM2 Outpatient Diabetes medications: Lantus 60 units BID, Novolog 15 units with breakfast and lunch, Novolog 20 units with supper, plus Novolog correction scale TID (along with meal coverage) Current orders for Inpatient glycemic control: Novolog 0-9 units Q4H  Inpatient Diabetes Program Recommendations: Insulin - Basal: Please consider ordering Lantus 10 units Q24H. Correction (SSI): If patient is eating well, please consider changing frequency of CBGs and Novolog correction to ACHS. Insulin - Meal Coverage: Please consider ordering Novolog 4 units TID with meals for meal coverage if patient eats at least 50% of meals. A1C: Please consider ordering an A1C to evaluate glycemic control over the past 2-3 months.  Thanks, Barnie [place], RN, MSN, CDE Diabetes Coordinator Inpatient Diabetes Program [phone] (Team Pager from 8am to 5pm)

## 2016-09-22 NOTE — Progress Notes (Signed)
Difficulty with obtaining accurate BP due to patient lying on side.Patient is awake and oriented in no obvious or stated difficulty. Manual pressures are at 118/64 +/-

## 2016-09-22 NOTE — Progress Notes (Signed)
PROGRESS NOTE    Jorge [name]  INO:[id number] DOB: December 26, 1965 DOA: 09/20/2016 PCP: Hilbert Corrigan, MD    Brief Narrative: 51 yo SNF with hx of COPD, cirrhosis, chronic pain on narcotics, DM, CKD with baseline Cr 1.5, previously DNR but now FULL CODE, admitted for chest pain and AMS.  He was found to be in respiratory failure with ABG showing pH of 7.1 and pCO2 of 96.  He responded of IV Narcan x 2, and is currently more awake on Bipap. CXR showed no infiltrate and no CHF.  He also was found to have Cr of 4, previously on Demadex and Vasotec.  His ECHO was 65% a year ago. He was admitted into the ICU, and his K was treated with insulin, D50, Calcium and Kayexalate.  He was started on Bipap, and was able to be off during the day.  He is hungry and would like to eat. No other complaints.    Assessment & Plan:   Principal Problem:   Acute on chronic respiratory failure with hypoxia (HCC) Active Problems:   COPD exacerbation (HCC)   CKD (chronic kidney disease) stage 3, GFR 30-59 ml/min   Chronic pain   Sleep apnea   1. COPD exacerbation:  Will give some IV steroid for his wheezing.  Continue with nebs, and will keep in ICU today in case he needs more Bipap.  Can start carb modified diet today.   2. DM:  Will continue with insulin.  Suspect BS will rise as steroid is given.   3. CKD:  Improved and Cr is now down to 3.3 from 4.6 on admission.  Will continue to follow daily. 4. Chronic pain:  WIll continue with Ultram.  Careful as he had required Narcan on admission. 5. Sleep apnea:  He needs to wear his CPAP.  Unlikely he will. 6. Hyperkalemia:  Will continue with Kayexalate.  Suspect better as his AKI improves.   DVT prophylaxis: Lovenox. Code Status: FULL CODE. Family Communication: None.  Disposition Plan: SNF.  Consultants:   None.   Procedures:   None.   Antimicrobials: Anti-infectives    None       Subjective:Feeling well.  Objective: Vitals:   09/22/16  0400 09/22/16 0500 09/22/16 0600 09/22/16 0732  BP: 93/69 110/80 (!) 150/114   Pulse: 61 (!) 57 61   Resp: 17 15 19    Temp: 98.4 F (36.9 C)     TempSrc: Oral     SpO2: 93% 96% 97% 95%  Weight:  (!) 187.5 kg (413 lb 5.8 oz)    Height:        Intake/Output Summary (Last 24 hours) at 09/22/16 0752 Last data filed at 09/22/16 0600  Gross per 24 hour  Intake             2895 ml  Output             2000 ml  Net              895 ml   Filed Weights   09/20/16 2240 09/21/16 0512 09/22/16 0500  Weight: (!) 174.6 kg (385 lb) (!) 187.5 kg (413 lb 5.8 oz) (!) 187.5 kg (413 lb 5.8 oz)    Examination:  General exam: Appears calm and comfortable  Respiratory system: Clear to auscultation. Respiratory effort normal. Cardiovascular system: S1 & S2 heard, RRR. No JVD, murmurs, rubs, gallops or clicks. No pedal edema. Gastrointestinal system: Abdomen is nondistended, soft and nontender. No organomegaly or masses  felt. Normal bowel sounds heard. Central nervous system: Alert and oriented. No focal neurological deficits. Extremities: Symmetric 5 x 5 power. Skin: No rashes, lesions or ulcers Psychiatry: Judgement and insight appear normal. Mood & affect appropriate.   Data Reviewed: I have personally reviewed following labs and imaging studies  CBC:  Recent Labs Lab 09/20/16 2300 09/22/16 0426  WBC 7.2 5.6  HGB 10.4* 10.5*  HCT 34.2* 32.8*  MCV 116.7* 111.6*  PLT 79* 80*   Basic Metabolic Panel:  Recent Labs Lab 09/20/16 2300 09/21/16 0157 09/21/16 1119 09/21/16 1948 09/22/16 0426  NA 137  --  138 137 139  K 6.2* 6.0* 7.2* 5.9* 6.2*  CL 94*  --  97* 97* 100*  CO2 34*  --  31 31 31   GLUCOSE 129* 145* 171* 238* 147*  BUN 107*  --  95* 120* 118*  CREATININE 4.59*  --  4.34* 4.03* 3.34*  CALCIUM 8.2*  --  7.9* 8.0* 7.8*   GFR: Estimated Creatinine Clearance: 46 mL/min (A) (by C-G formula based on SCr of 3.34 mg/dL (H)). Liver Function Tests:  Recent Labs Lab  09/20/16 2300 09/22/16 0426  AST 19 22  ALT 20 26  ALKPHOS 71 65  BILITOT 0.7 0.6  PROT 7.1 6.3*  ALBUMIN 3.4* 3.1*    Recent Labs Lab 09/20/16 2300  LIPASE 32    Recent Labs Lab 09/21/16 0157  AMMONIA 59*   Coagulation Profile:  Recent Labs Lab 09/20/16 2300  INR 1.02   Cardiac Enzymes:  Recent Labs Lab 09/20/16 2300 09/21/16 0525 09/21/16 1119 09/21/16 1948  TROPONINI 0.74* 0.72* 0.78* 0.67*   BNP (last 3 results) No results for input(s): PROBNP in the last 8760 hours. HbA1C: No results for input(s): HGBA1C in the last 72 hours. CBG:  Recent Labs Lab 09/21/16 1132 09/21/16 1626 09/21/16 1951 09/22/16 0010 09/22/16 0406  GLUCAP 171* 234* 229* 142* 136*   Lipid Profile: No results for input(s): CHOL, HDL, LDLCALC, TRIG, CHOLHDL, LDLDIRECT in the last 72 hours. Thyroid Function Tests: No results for input(s): TSH, T4TOTAL, FREET4, T3FREE, THYROIDAB in the last 72 hours. Anemia Panel: No results for input(s): VITAMINB12, FOLATE, FERRITIN, TIBC, IRON, RETICCTPCT in the last 72 hours. Sepsis Labs:  Recent Labs Lab 09/21/16 0043 09/21/16 0328  LATICACIDVEN 0.8 0.7    Recent Results (from the past 240 hour(s))  MRSA PCR Screening     Status: None   Collection Time: 09/21/16  5:07 AM  Result Value Ref Range Status   MRSA by PCR NEGATIVE NEGATIVE Final    Comment:        The GeneXpert MRSA Assay (FDA approved for NASAL specimens only), is one component of a comprehensive MRSA colonization surveillance program. It is not intended to diagnose MRSA infection nor to guide or monitor treatment for MRSA infections.      Radiology Studies: Dg Chest Portable 1 View  Result Date: 09/21/2016 CLINICAL DATA:  Chest pain and shortness of breath. History of hypertension, COPD, diabetes, former smoker. EXAM: PORTABLE CHEST 1 VIEW COMPARISON:  06/14/2016 FINDINGS: Examination is somewhat limited due to patient positioning. Shallow inspiration. Cardiac  enlargement without vascular congestion. No focal consolidation in the lungs. No blunting of costophrenic angles. No pneumothorax. IMPRESSION: No evidence of active pulmonary disease.  Cardiac enlargement. Electronically Signed   By: Lucienne Capers M.D.   On: 09/21/2016 00:21    Scheduled Meds: . enoxaparin (LOVENOX) injection  40 mg Subcutaneous Q24H  . insulin aspart  0-9 Units  Subcutaneous Q4H  . ipratropium-albuterol  3 mL Nebulization Q6H  . methylPREDNISolone (SOLU-MEDROL) injection  80 mg Intravenous Q12H  . methylPREDNISolone (SOLU-MEDROL) injection  40 mg Intravenous Q12H  . naloxone  0.4 mg Intravenous Once  . sodium polystyrene  30 g Oral Once   Continuous Infusions: . sodium chloride Stopped (09/21/16 1740)  . sodium chloride 100 mL/hr at 09/22/16 0600     LOS: 1 day   Ami Thornsberry, MD FACP Hospitalist.   If 7PM-7AM, please contact night-coverage [email or web address] Password [facility] 09/22/2016, 7:52 AM

## 2016-09-23 DIAGNOSIS — N189 Chronic kidney disease, unspecified: Secondary | ICD-10-CM

## 2016-09-23 DIAGNOSIS — N179 Acute kidney failure, unspecified: Secondary | ICD-10-CM

## 2016-09-23 DIAGNOSIS — J9622 Acute and chronic respiratory failure with hypercapnia: Principal | ICD-10-CM

## 2016-09-23 LAB — URINALYSIS, ROUTINE W REFLEX MICROSCOPIC
BILIRUBIN URINE: NEGATIVE
GLUCOSE, UA: NEGATIVE mg/dL
Hgb urine dipstick: NEGATIVE
KETONES UR: NEGATIVE mg/dL
Leukocytes, UA: NEGATIVE
Nitrite: NEGATIVE
PH: 5 (ref 5.0–8.0)
Protein, ur: NEGATIVE mg/dL
Specific Gravity, Urine: 1.011 (ref 1.005–1.030)

## 2016-09-23 LAB — BASIC METABOLIC PANEL
Anion gap: 8 (ref 5–15)
BUN: 102 mg/dL — ABNORMAL HIGH (ref 6–20)
CHLORIDE: 103 mmol/L (ref 101–111)
CO2: 32 mmol/L (ref 22–32)
CREATININE: 2.18 mg/dL — AB (ref 0.61–1.24)
Calcium: 7.6 mg/dL — ABNORMAL LOW (ref 8.9–10.3)
GFR calc non Af Amer: 33 mL/min — ABNORMAL LOW (ref >60)
GFR, EST AFRICAN AMERICAN: 38 mL/min — AB (ref >60)
GLUCOSE: 193 mg/dL — AB (ref 65–99)
Potassium: 5.2 mmol/L — ABNORMAL HIGH (ref 3.5–5.1)
Sodium: 143 mmol/L (ref 135–145)

## 2016-09-23 LAB — GLUCOSE, CAPILLARY
GLUCOSE-CAPILLARY: 189 mg/dL — AB (ref 65–99)
GLUCOSE-CAPILLARY: 169 mg/dL — AB (ref 65–99)
GLUCOSE-CAPILLARY: 190 mg/dL — AB (ref 65–99)
Glucose-Capillary: 193 mg/dL — ABNORMAL HIGH (ref 65–99)
Glucose-Capillary: 158 mg/dL — ABNORMAL HIGH (ref 65–99)

## 2016-09-23 MED ORDER — PREDNISONE 20 MG PO TABS
40.0000 mg | ORAL_TABLET | Freq: Every day | ORAL | Status: DC
  Administered 2016-09-23 – 2016-09-24 (×2): 40 mg via ORAL
  Filled 2016-09-23 (×2): qty 2

## 2016-09-23 MED ORDER — INSULIN ASPART 100 UNIT/ML ~~LOC~~ SOLN
0.0000 [IU] | Freq: Three times a day (TID) | SUBCUTANEOUS | Status: DC
  Administered 2016-09-23 – 2016-09-24 (×3): 4 [IU] via SUBCUTANEOUS

## 2016-09-23 MED ORDER — INSULIN ASPART 100 UNIT/ML ~~LOC~~ SOLN: 0.0000 [IU] | Freq: Every day | SUBCUTANEOUS | Status: DC

## 2016-09-23 MED ORDER — INSULIN GLARGINE 100 UNIT/ML ~~LOC~~ SOLN
10.0000 [IU] | Freq: Every day | SUBCUTANEOUS | Status: DC
  Administered 2016-09-23 – 2016-09-24 (×2): 10 [IU] via SUBCUTANEOUS
  Filled 2016-09-23 (×3): qty 0.1

## 2016-09-23 MED ORDER — INSULIN ASPART 100 UNIT/ML ~~LOC~~ SOLN
4.0000 [IU] | Freq: Three times a day (TID) | SUBCUTANEOUS | Status: DC
  Administered 2016-09-23 – 2016-09-24 (×3): 4 [IU] via SUBCUTANEOUS

## 2016-09-23 NOTE — Progress Notes (Signed)
Patient off BIPAP for now on 4lpm nasal cannula

## 2016-09-23 NOTE — Progress Notes (Signed)
Patient up sitting in the recliner. Vital signs are stable. Denies pain. Report called to Vista Deck, RN. Patient transferred to room 331.

## 2016-09-23 NOTE — Progress Notes (Signed)
PROGRESS NOTE    Jorge [name]  JEH:[id number] DOB: Nov 07, 1965 DOA: 09/20/2016 PCP: Hilbert Corrigan, MD    Brief Narrative: 51 yo SNF with hx of COPD, cirrhosis, chronic pain on narcotics, DM, CKD with baseline Cr 1.5, previously DNR but now FULL CODE, admitted for chest pain and AMS. He was found to be in respiratory failure with ABG showing pH of 7.1 and pCO2 of 96. He responded of IV Narcan x 2, and is currently more awake.  He did require the use of Bipap, but is now transitioned to [place].  CXR showed no infiltrate and no CHF. He also was found to have Cr of 4, previously on Demadex and Vasotec. His ECHO was 65% a year ago. He was admitted into the ICU, and his K was treated with insulin, D50, Calcium and Kayexalate.  His K lower to 5.2 today.   He is hungry and would like to eat. No other complaints.    Assessment & Plan:   Principal Problem:   Acute on chronic respiratory failure with hypoxia (HCC) Active Problems:   COPD exacerbation (HCC)   CKD (chronic kidney disease) stage 3, GFR 30-59 ml/min   Chronic pain   Sleep apnea  1. COPD exacerbation:  IV steroid has improved his breathing.  Will change to oral Prednisone.   Continue with nebs, and will transfer him to telemetry today.  2. DM:  Will continue with insulin.  Suspect BS will rise as steroid is given.  Will change insulin regimen as recommended.  3. CKD:  Improved and Cr is now down to 2,18 from 4.6 on admission.  Will continue to follow daily. 4. Chronic pain:  WIll continue with Ultram.  Careful as he had required Narcan on admission. 5. Sleep apnea:  He needs to wear his CPAP.  Unlikely he will. 6. Hyperkalemia:  Will continue with Kayexalate.  Suspect better as his AKI improves.    DVT prophylaxis: Lovenox. Code Status: FULL CODE. Family Communication: None.  Disposition Plan: SNF.  Consultants:   None.   Procedures:   None.    Antimicrobials: Anti-infectives    None       Subjective:    Doing well.   Objective: Vitals:   09/23/16 0600 09/23/16 0700 09/23/16 0707 09/23/16 0729  BP: 132/73 (!) 143/96    Pulse: 66 67 66   Resp: 15 (!) 21 17   Temp:   98.2 F (36.8 C)   TempSrc:   Axillary   SpO2: 93% (!) 89% (!) 88% 90%  Weight:      Height:        Intake/Output Summary (Last 24 hours) at 09/23/16 0857 Last data filed at 09/23/16 0600  Gross per 24 hour  Intake             2820 ml  Output             3150 ml  Net             -330 ml   Filed Weights   09/21/16 0512 09/22/16 0500 09/23/16 0400  Weight: (!) 187.5 kg (413 lb 5.8 oz) (!) 187.5 kg (413 lb 5.8 oz) (!) 198.1 kg (436 lb 11.7 oz)    Examination:  General exam: Appears calm and comfortable. Sitting up on his chair.  No distress.  Respiratory system: Clear to auscultation. Respiratory effort normal. Cardiovascular system: S1 & S2 heard, RRR. No JVD, murmurs, rubs, gallops or clicks. No pedal edema. Gastrointestinal system:  Abdomen is nondistended, soft and nontender. No organomegaly or masses felt. Normal bowel sounds heard. Central nervous system: Alert and oriented. No focal neurological deficits. Extremities: Symmetric 5 x 5 power. Skin: No rashes, lesions or ulcers Psychiatry: Judgement and insight appear normal. Mood & affect appropriate.   Data Reviewed: I have personally reviewed following labs and imaging studies  CBC:  Recent Labs Lab 09/20/16 2300 09/22/16 0426  WBC 7.2 5.6  HGB 10.4* 10.5*  HCT 34.2* 32.8*  MCV 116.7* 111.6*  PLT 79* 80*   Basic Metabolic Panel:  Recent Labs Lab 09/20/16 2300 09/21/16 0157 09/21/16 1119 09/21/16 1948 09/22/16 0426 09/23/16 0511  NA 137  --  138 137 139 143  K 6.2* 6.0* 7.2* 5.9* 6.2* 5.2*  CL 94*  --  97* 97* 100* 103  CO2 34*  --  31 31 31  32  GLUCOSE 129* 145* 171* 238* 147* 193*  BUN 107*  --  95* 120* 118* 102*  CREATININE 4.59*  --  4.34* 4.03* 3.34* 2.18*  CALCIUM 8.2*  --  7.9* 8.0* 7.8* 7.6*   GFR: Estimated Creatinine  Clearance: 72.8 mL/min (A) (by C-G formula based on SCr of 2.18 mg/dL (H)). Liver Function Tests:  Recent Labs Lab 09/20/16 2300 09/22/16 0426  AST 19 22  ALT 20 26  ALKPHOS 71 65  BILITOT 0.7 0.6  PROT 7.1 6.3*  ALBUMIN 3.4* 3.1*    Recent Labs Lab 09/20/16 2300  LIPASE 32    Recent Labs Lab 09/21/16 0157  AMMONIA 59*   Coagulation Profile:  Recent Labs Lab 09/20/16 2300  INR 1.02   Cardiac Enzymes:  Recent Labs Lab 09/20/16 2300 09/21/16 0525 09/21/16 1119 09/21/16 1948  TROPONINI 0.74* 0.72* 0.78* 0.67*   CBG:  Recent Labs Lab 09/22/16 1633 09/22/16 2004 09/22/16 2312 09/23/16 0404 09/23/16 0706  GLUCAP 148* 214* 147* 193* 158*   Lipid Profile: Sepsis Labs:  Recent Labs Lab 09/21/16 0043 09/21/16 0328  LATICACIDVEN 0.8 0.7    Recent Results (from the past 240 hour(s))  MRSA PCR Screening     Status: None   Collection Time: 09/21/16  5:07 AM  Result Value Ref Range Status   MRSA by PCR NEGATIVE NEGATIVE Final    Comment:        The GeneXpert MRSA Assay (FDA approved for NASAL specimens only), is one component of a comprehensive MRSA colonization surveillance program. It is not intended to diagnose MRSA infection nor to guide or monitor treatment for MRSA infections.      Radiology Studies: No results found.  Scheduled Meds: . enoxaparin (LOVENOX) injection  40 mg Subcutaneous Q24H  . insulin aspart  0-9 Units Subcutaneous Q4H  . ipratropium-albuterol  3 mL Nebulization Q6H  . naloxone  0.4 mg Intravenous Once  . predniSONE  40 mg Oral QAC breakfast   Continuous Infusions: . sodium chloride Stopped (09/21/16 1740)     LOS: 2 days   Lafaye Mcelmurry, MD FACP Hospitalist.   If 7PM-7AM, please contact night-coverage [email or web address] Password [facility] 09/23/2016, 8:57 AM

## 2016-09-24 LAB — BASIC METABOLIC PANEL
ANION GAP: 7 (ref 5–15)
BUN: 79 mg/dL — ABNORMAL HIGH (ref 6–20)
CHLORIDE: 107 mmol/L (ref 101–111)
CO2: 32 mmol/L (ref 22–32)
Calcium: 7.9 mg/dL — ABNORMAL LOW (ref 8.9–10.3)
Creatinine, Ser: 1.6 mg/dL — ABNORMAL HIGH (ref 0.61–1.24)
GFR calc non Af Amer: 48 mL/min — ABNORMAL LOW (ref >60)
GFR, EST AFRICAN AMERICAN: 56 mL/min — AB (ref >60)
Glucose, Bld: 129 mg/dL — ABNORMAL HIGH (ref 65–99)
Potassium: 4.6 mmol/L (ref 3.5–5.1)
Sodium: 146 mmol/L — ABNORMAL HIGH (ref 135–145)

## 2016-09-24 LAB — GLUCOSE, CAPILLARY
Glucose-Capillary: 123 mg/dL — ABNORMAL HIGH (ref 65–99)
Glucose-Capillary: 155 mg/dL — ABNORMAL HIGH (ref 65–99)

## 2016-09-24 MED ORDER — IPRATROPIUM-ALBUTEROL 0.5-2.5 (3) MG/3ML IN SOLN
3.0000 mL | Freq: Four times a day (QID) | RESPIRATORY_TRACT | Status: DC
  Administered 2016-09-24 (×2): 3 mL via RESPIRATORY_TRACT
  Filled 2016-09-24 (×2): qty 3

## 2016-09-24 MED ORDER — PREDNISONE 20 MG PO TABS: 40.0000 mg | ORAL_TABLET | Freq: Every day | ORAL | 0 refills | Status: DC

## 2016-09-24 MED ORDER — TRAMADOL HCL 50 MG PO TABS: 50.0000 mg | ORAL_TABLET | Freq: Four times a day (QID) | ORAL | 0 refills | Status: AC | PRN

## 2016-09-24 NOTE — Progress Notes (Signed)
Pt IV removed, tolerated well.  Pt to discharge back to [facility].  Report called to April at [facility], answered questions at this time.  Awaiting EMS to transport at this time.

## 2016-09-24 NOTE — Clinical Social Work Note (Addendum)
Clinical Social Worker facilitated patient discharge including contacting patient family and facility to confirm patient discharge plans. Clinical information faxed [phone])to facility and family agreeable with plan. Nurse will arrange ambulance transport via [facility] (Med Necessity Form has been completed by CSW) to [facility]. RN to call report to [phone] prior to discharge.  Clinical Social Worker will sign off for now as social work intervention is no longer needed. Please consult [place] again if new need arises.  Joselynne Killam B. Joline Maxcy Clinical Social Work Dept Weekend Social Worker [phone] 10:14 AM

## 2016-09-24 NOTE — Clinical Social Work Placement (Signed)
   CLINICAL SOCIAL WORK PLACEMENT  NOTE  Date:  09/24/2016  Patient Details  Name: Jorge [name] [facility] MRN: [id number] Date of Birth: 19-Dec-1965  Clinical Social Work is seeking post-discharge placement for this patient at the [place] level of care (*CSW will initial, date and re-position this form in  chart as items are completed):  Yes   Patient/family provided with [facility] Work Department's list of facilities offering this level of care within the geographic area requested by the patient (or if unable, by the patient's family).  Yes   Patient/family informed of their freedom to choose among providers that offer the needed level of care, that participate in Medicare, Medicaid or managed care program needed by the patient, have an available bed and are willing to accept the patient.  Yes   Patient/family informed of [facility]'s ownership interest in [facility] and [facility], as well as of the fact that they are under no obligation to receive care at these facilities.  PASRR submitted to EDS on       PASRR number received on       Existing PASRR number confirmed on 09/24/16     FL2 transmitted to all facilities in geographic area requested by pt/family on       FL2 transmitted to all facilities within larger geographic area on       Patient informed that his/her managed care company has contracts with or will negotiate with certain facilities, including the following:            Patient/family informed of bed offers received.  Patient chooses bed at  (Pt is from [place])     Physician recommends and patient chooses bed at      Patient to be transferred to  (Pt is from [place]) on 09/24/16.  Patient to be transferred to facility by  Corey Harold)     Patient family notified on 09/24/16 of transfer.  Name of family member notified:  Emergency Contact     PHYSICIAN Please sign FL2     Additional Comment:     _______________________________________________ Serafina Mitchell, LCSWA 09/24/2016, 10:17 AM

## 2016-09-24 NOTE — Discharge Summary (Signed)
Physician Discharge Summary  Jorge [name] CWC:[id number] DOB: 21-May-1965 DOA: 09/20/2016  PCP: Jorge Corrigan, MD  Admit date: 09/20/2016 Discharge date: 09/24/2016  Admitted From:  SNF Disposition:  SNF  Recommendations for Outpatient Follow-up:  1. Follow up with PCP in 1-2 weeks 2. Please obtain BMP/CBC in one week  Home Health: None.  Equipment/Devices: None.  Discharge Condition: Alert and in no respiratory distress. CODE STATUS:  FULL CODE.  Diet recommendation: Carb modified cardiac diet.   Brief/Interim Summary:  Patient was admitted for AMS and respiratory failure on Sep 21, 2016 by Dr Rosana Hoes.  As per her H and P:  "  Jorge [name] is a 51 y.o. male with medical history significant of COPD, cirrhosis, chronic pain, DM, CKD comes in from SNF with DNR paperwork for AMS and complaints of chest pain.  Pt was given narcan in the ED and woke up some.  He is on bipap and history is limited.  Pt has resended his DNR and wishes to be full code.  He cannot provide anything that is reliable.  He denies any cough or fevers.  Says his chest hurts.  He is very drowsy.  Over the last couple of hours in the ED he has been on bipap.  Suppose to be on bipap qhs at SNF, unknown if this is the case."  HOSPITAL COURSE:  Patient was admitted into the ICU, and though multiple causes can attributed to his respiratory failure, manisfested as severe hypercapneic hypoxic respiratory failure, it is narcotic overdose as the most suspicious etiology, given he responded to IV Narcan x 2.  He was admitted into the ICU and placed on Bipap.  He improved the following day, and his Narcotics were discontinued.  He was also found to have AKI with Cr of 4, and evidence of hyperkalemia.  His ACE I was discontinued, and he was given Kayexalate, after several rounds of D50, Insulin and Calcium.  He was given IVF, and his AKI resolved.  Cr went from 4 to 1.4 at the time of discharge.  His last basic metabolic profile  was acceptable, and he is now alert breathing on .  He should use bipap every night, but he said he has not been compliant.  Please follow up with his basic metabolic profile next week, to be sure his Cr continues to improve, and that his K remained normal. He was able to tolerate some Ultram for his chronic pain.  Thank you for allowing me to participate in his care.  Good Day.   Discharge Diagnoses:  Principal Problem:   Acute on chronic respiratory failure with hypoxia (HCC) Active Problems:   COPD exacerbation (HCC)   CKD (chronic kidney disease) stage 3, GFR 30-59 ml/min   Chronic pain   Sleep apnea    Discharge Instructions  Discharge Instructions    Diet - low sodium heart healthy    Complete by:  As directed    Discharge instructions    Complete by:  As directed    Try to avoid narcotics as it can stop you from breathing.  See your PCP next week.   Increase activity slowly    Complete by:  As directed      Allergies as of 09/24/2016      Reactions   Penicillins Anaphylaxis   Patient states he is not allergic to anything;  Pt says it's his twin brother that has this allergy.      Medication List  STOP taking these medications   HYDROcodone-acetaminophen 10-325 MG tablet Commonly known as:  NORCO     TAKE these medications   ARIPiprazole 20 MG tablet Commonly known as:  ABILIFY Take 20 mg by mouth daily.   aspirin 81 MG tablet Take 81 mg by mouth daily.   atenolol 25 MG tablet Commonly known as:  TENORMIN Take 25 mg by mouth daily.   BREO ELLIPTA 100-25 MCG/INH Aepb Generic drug:  fluticasone furoate-vilanterol Inhale 1 puff into the lungs daily.   busPIRone 7.5 MG tablet Commonly known as:  BUSPAR Take 7.5 mg by mouth 2 (two) times daily. 3 day dose to be completed 09/22/2016 What changed:  Another medication with the same name was removed. Continue taking this medication, and follow the directions you see here.   calcitRIOL 0.25 MCG  capsule Commonly known as:  ROCALTROL Take 1 capsule (0.25 mcg total) by mouth 2 (two) times daily with a meal.   calcium citrate 950 MG tablet Commonly known as:  CALCITRATE - dosed in mg elemental calcium Take 1 tablet by mouth daily.   cholecalciferol 1000 units tablet Commonly known as:  VITAMIN D Take 1,000 Units by mouth daily. Reported on 05/03/2015   citalopram 40 MG tablet Commonly known as:  CELEXA Take 40 mg by mouth at bedtime.   cyclobenzaprine 10 MG tablet Commonly known as:  FLEXERIL Take 1 tablet (10 mg total) by mouth 3 (three) times daily as needed (muscle soreness).   diphenhydrAMINE 25 MG tablet Commonly known as:  BENADRYL Take 25 mg by mouth at bedtime as needed for sleep.   enalapril 2.5 MG tablet Commonly known as:  VASOTEC Take 2.5 mg by mouth daily.   gabapentin 300 MG capsule Commonly known as:  NEURONTIN Take 300 mg by mouth 3 (three) times daily.   guaiFENesin 600 MG 12 hr tablet Commonly known as:  MUCINEX Take 600 mg by mouth 2 (two) times daily.   ipratropium-albuterol 0.5-2.5 (3) MG/3ML Soln Commonly known as:  DUONEB Take 3 mLs by nebulization 3 (three) times daily. What changed:  additional instructions   IRON-150 PO Take 1 tablet by mouth daily.   lactulose (encephalopathy) 10 GM/15ML Soln Commonly known as:  CHRONULAC Take 14.5 g by mouth 3 (three) times daily. 15mls 3 times daily   LANTUS SOLOSTAR 100 UNIT/ML Solostar Pen Generic drug:  Insulin Glargine Inject 60 Units into the skin 2 (two) times daily.   levothyroxine 25 MCG tablet Commonly known as:  SYNTHROID, LEVOTHROID Take 25 mcg by mouth daily before breakfast.   Melatonin 5 MG Caps Take 1 capsule by mouth at bedtime.   metolazone 2.5 MG tablet Commonly known as:  ZAROXOLYN Take 2.5 mg by mouth every Monday, Wednesday, and Friday.   nicotine 14 mg/24hr patch Commonly known as:  NICODERM CQ - dosed in mg/24 hours Place 14 mg onto the skin daily.    nitroGLYCERIN 0.4 MG SL tablet Commonly known as:  NITROSTAT Place 0.4 mg under the tongue every 5 (five) minutes as needed for chest pain.   NOVOLOG FLEXPEN 100 UNIT/ML FlexPen Generic drug:  insulin aspart Inject 15-20 Units into the skin 4 (four) times daily -  before meals and at bedtime. 15 units twice daily at 730a and 1130a, then take 20 units at 1700 in addition to sliding scale  150-200=2units Call np/md if less than 70 201-250=4units 251-300=6units 301-350=8units 351-400=10units Greater than 400 call np and md   omeprazole 20 MG capsule Commonly known as:  PRILOSEC Take 20 mg by mouth daily.   OXYGEN Inhale 3 L into the lungs daily as needed (for breathing).   polyethylene glycol packet Commonly known as:  MIRALAX / GLYCOLAX Take 17 g by mouth daily as needed for mild constipation or moderate constipation.   predniSONE 20 MG tablet Commonly known as:  DELTASONE Take 2 tablets (40 mg total) by mouth daily with breakfast.   PROAIR HFA 108 (90 Base) MCG/ACT inhaler Generic drug:  albuterol Inhale 2 puffs into the lungs every 6 (six) hours as needed for wheezing or shortness of breath. Shortness of breath/wheeze   rifaximin 200 MG tablet Commonly known as:  XIFAXAN Take 200 mg by mouth daily.   sennosides-docusate sodium 8.6-50 MG tablet Commonly known as:  SENOKOT-S Take 1 tablet by mouth daily.   sitaGLIPtin 25 MG tablet Commonly known as:  JANUVIA Take 25 mg by mouth daily.   torsemide 20 MG tablet Commonly known as:  DEMADEX Take 40 mg by mouth daily.   traMADol 50 MG tablet Commonly known as:  ULTRAM Take 1 tablet (50 mg total) by mouth every 6 (six) hours as needed for moderate pain.   traZODone 50 MG tablet Commonly known as:  DESYREL Take 150 mg by mouth at bedtime.   Vitamin D (Ergocalciferol) 50000 units Caps capsule Commonly known as:  DRISDOL Take 50,000 Units by mouth every 30 (thirty) days.       Allergies  Allergen Reactions  .  Penicillins Anaphylaxis    Patient states he is not allergic to anything;  Pt says it's his twin brother that has this allergy.    Consultations:  NONE>    Procedures/Studies: Dg Chest Portable 1 View  Result Date: 09/21/2016 CLINICAL DATA:  Chest pain and shortness of breath. History of hypertension, COPD, diabetes, former smoker. EXAM: PORTABLE CHEST 1 VIEW COMPARISON:  06/14/2016 FINDINGS: Examination is somewhat limited due to patient positioning. Shallow inspiration. Cardiac enlargement without vascular congestion. No focal consolidation in the lungs. No blunting of costophrenic angles. No pneumothorax. IMPRESSION: No evidence of active pulmonary disease.  Cardiac enlargement. Electronically Signed   By: Lucienne Capers M.D.   On: 09/21/2016 00:21       Subjective:   Discharge Exam: Vitals:   09/24/16 0003 09/24/16 0648  BP:  125/64  Pulse: 63 70  Resp: 20 20  Temp:  98.3 F (36.8 C)   Vitals:   09/23/16 2204 09/24/16 0003 09/24/16 0648 09/24/16 0730  BP: (!) 125/51  125/64   Pulse: 69 63 70   Resp: 18 20 20    Temp: 97.8 F (36.6 C)  98.3 F (36.8 C)   TempSrc: Oral  Oral   SpO2: 95% 93% 90% 91%  Weight:      Height:        General: Pt is alert, awake, not in acute distress Cardiovascular: RRR, S1/S2 +, no rubs, no gallops Respiratory: CTA bilaterally, no wheezing, no rhonchi Abdominal: Soft, NT, ND, bowel sounds + Extremities: no edema, no cyanosis    The results of significant diagnostics from this hospitalization (including imaging, microbiology, ancillary and laboratory) are listed below for reference.     Microbiology: Recent Results (from the past 240 hour(s))  MRSA PCR Screening     Status: None   Collection Time: 09/21/16  5:07 AM  Result Value Ref Range Status   MRSA by PCR NEGATIVE NEGATIVE Final    Comment:        The GeneXpert MRSA Assay (FDA  approved for NASAL specimens only), is one component of a comprehensive MRSA  colonization surveillance program. It is not intended to diagnose MRSA infection nor to guide or monitor treatment for MRSA infections.      Labs: BNP (last 3 results)  Recent Labs  12/13/15 0901 09/20/16 2300  BNP 124.0* 950.9*   Basic Metabolic Panel:  Recent Labs Lab 09/21/16 1119 09/21/16 1948 09/22/16 0426 09/23/16 0511 09/24/16 0605  NA 138 137 139 143 146*  K 7.2* 5.9* 6.2* 5.2* 4.6  CL 97* 97* 100* 103 107  CO2 31 31 31  32 32  GLUCOSE 171* 238* 147* 193* 129*  BUN 95* 120* 118* 102* 79*  CREATININE 4.34* 4.03* 3.34* 2.18* 1.60*  CALCIUM 7.9* 8.0* 7.8* 7.6* 7.9*   Liver Function Tests:  Recent Labs Lab 09/20/16 2300 09/22/16 0426  AST 19 22  ALT 20 26  ALKPHOS 71 65  BILITOT 0.7 0.6  PROT 7.1 6.3*  ALBUMIN 3.4* 3.1*    Recent Labs Lab 09/20/16 2300  LIPASE 32    Recent Labs Lab 09/21/16 0157  AMMONIA 59*   CBC:  Recent Labs Lab 09/20/16 2300 09/22/16 0426  WBC 7.2 5.6  HGB 10.4* 10.5*  HCT 34.2* 32.8*  MCV 116.7* 111.6*  PLT 79* 80*   Cardiac Enzymes:  Recent Labs Lab 09/20/16 2300 09/21/16 0525 09/21/16 1119 09/21/16 1948  TROPONINI 0.74* 0.72* 0.78* 0.67*   BNP: Invalid input(s): POCBNP CBG:  Recent Labs Lab 09/23/16 0706 09/23/16 1138 09/23/16 1615 09/23/16 2103 09/24/16 0721  GLUCAP 158* 189* 169* 190* 123*   Urinalysis    Component Value Date/Time   COLORURINE STRAW (A) 09/23/2016 1215   APPEARANCEUR CLEAR 09/23/2016 1215   APPEARANCEUR Clear 03/25/2013 1630   LABSPEC 1.011 09/23/2016 1215   LABSPEC 1.023 03/25/2013 1630   PHURINE 5.0 09/23/2016 1215   GLUCOSEU NEGATIVE 09/23/2016 1215   GLUCOSEU Negative 03/25/2013 1630   HGBUR NEGATIVE 09/23/2016 1215   BILIRUBINUR NEGATIVE 09/23/2016 1215   BILIRUBINUR Negative 03/25/2013 1630   KETONESUR NEGATIVE 09/23/2016 1215   PROTEINUR NEGATIVE 09/23/2016 1215   UROBILINOGEN 4.0 (H) 12/21/2014 1618   NITRITE NEGATIVE 09/23/2016 1215   LEUKOCYTESUR  NEGATIVE 09/23/2016 1215   LEUKOCYTESUR Negative 03/25/2013 1630   Microbiology Recent Results (from the past 240 hour(s))  MRSA PCR Screening     Status: None   Collection Time: 09/21/16  5:07 AM  Result Value Ref Range Status   MRSA by PCR NEGATIVE NEGATIVE Final    Comment:        The GeneXpert MRSA Assay (FDA approved for NASAL specimens only), is one component of a comprehensive MRSA colonization surveillance program. It is not intended to diagnose MRSA infection nor to guide or monitor treatment for MRSA infections.      Time coordinating discharge: Over 30 minutes  SIGNED:   Orvan Falconer, MD FACP [facility] 09/24/2016, 11:14 AM   If 7PM-7AM, please contact night-coverage [email or web address] Password [email or web address]

## 2016-09-24 NOTE — Progress Notes (Signed)
Tried nasal Mask , patient could not tolerate , on full mask with auto titrate , bled in oxygen 5 lpm

## 2016-10-09 ENCOUNTER — Inpatient Hospital Stay (HOSPITAL_COMMUNITY)
Admission: EM | Admit: 2016-10-09 | Discharge: 2016-10-11 | DRG: 683 | Disposition: A | Discharge status: Skilled Nursing Facility | Payer: Medicare Other | Attending: Family Medicine | Admitting: Family Medicine

## 2016-10-09 ENCOUNTER — Encounter (HOSPITAL_COMMUNITY): Payer: Self-pay | Admitting: Emergency Medicine

## 2016-10-09 ENCOUNTER — Emergency Department (HOSPITAL_COMMUNITY): Payer: Medicare Other

## 2016-10-09 DIAGNOSIS — G8929 Other chronic pain: Secondary | ICD-10-CM | POA: Diagnosis present

## 2016-10-09 DIAGNOSIS — N183 Chronic kidney disease, stage 3 unspecified: Secondary | ICD-10-CM

## 2016-10-09 DIAGNOSIS — Z7982 Long term (current) use of aspirin: Secondary | ICD-10-CM | POA: Diagnosis not present

## 2016-10-09 DIAGNOSIS — E119 Type 2 diabetes mellitus without complications: Secondary | ICD-10-CM

## 2016-10-09 DIAGNOSIS — Z915 Personal history of self-harm: Secondary | ICD-10-CM

## 2016-10-09 DIAGNOSIS — N179 Acute kidney failure, unspecified: Secondary | ICD-10-CM | POA: Diagnosis not present

## 2016-10-09 DIAGNOSIS — J9811 Atelectasis: Secondary | ICD-10-CM | POA: Diagnosis present

## 2016-10-09 DIAGNOSIS — D539 Nutritional anemia, unspecified: Secondary | ICD-10-CM | POA: Diagnosis not present

## 2016-10-09 DIAGNOSIS — Z888 Allergy status to other drugs, medicaments and biological substances status: Secondary | ICD-10-CM

## 2016-10-09 DIAGNOSIS — R9431 Abnormal electrocardiogram [ECG] [EKG]: Secondary | ICD-10-CM | POA: Diagnosis present

## 2016-10-09 DIAGNOSIS — J9612 Chronic respiratory failure with hypercapnia: Secondary | ICD-10-CM | POA: Diagnosis present

## 2016-10-09 DIAGNOSIS — G4733 Obstructive sleep apnea (adult) (pediatric): Secondary | ICD-10-CM | POA: Diagnosis not present

## 2016-10-09 DIAGNOSIS — I10 Essential (primary) hypertension: Secondary | ICD-10-CM | POA: Diagnosis not present

## 2016-10-09 DIAGNOSIS — E118 Type 2 diabetes mellitus with unspecified complications: Secondary | ICD-10-CM | POA: Diagnosis not present

## 2016-10-09 DIAGNOSIS — I440 Atrioventricular block, first degree: Secondary | ICD-10-CM | POA: Diagnosis present

## 2016-10-09 DIAGNOSIS — Z6841 Body Mass Index (BMI) 40.0 and over, adult: Secondary | ICD-10-CM | POA: Diagnosis not present

## 2016-10-09 DIAGNOSIS — N189 Chronic kidney disease, unspecified: Secondary | ICD-10-CM | POA: Diagnosis present

## 2016-10-09 DIAGNOSIS — Z794 Long term (current) use of insulin: Secondary | ICD-10-CM | POA: Diagnosis not present

## 2016-10-09 DIAGNOSIS — E1122 Type 2 diabetes mellitus with diabetic chronic kidney disease: Secondary | ICD-10-CM | POA: Diagnosis present

## 2016-10-09 DIAGNOSIS — Z87891 Personal history of nicotine dependence: Secondary | ICD-10-CM | POA: Diagnosis not present

## 2016-10-09 DIAGNOSIS — K219 Gastro-esophageal reflux disease without esophagitis: Secondary | ICD-10-CM | POA: Diagnosis present

## 2016-10-09 DIAGNOSIS — I129 Hypertensive chronic kidney disease with stage 1 through stage 4 chronic kidney disease, or unspecified chronic kidney disease: Secondary | ICD-10-CM | POA: Diagnosis present

## 2016-10-09 DIAGNOSIS — G4730 Sleep apnea, unspecified: Secondary | ICD-10-CM | POA: Diagnosis present

## 2016-10-09 DIAGNOSIS — J449 Chronic obstructive pulmonary disease, unspecified: Secondary | ICD-10-CM | POA: Diagnosis present

## 2016-10-09 DIAGNOSIS — K7460 Unspecified cirrhosis of liver: Secondary | ICD-10-CM | POA: Diagnosis present

## 2016-10-09 DIAGNOSIS — Z79899 Other long term (current) drug therapy: Secondary | ICD-10-CM | POA: Diagnosis not present

## 2016-10-09 DIAGNOSIS — F418 Other specified anxiety disorders: Secondary | ICD-10-CM | POA: Diagnosis present

## 2016-10-09 DIAGNOSIS — E669 Obesity, unspecified: Secondary | ICD-10-CM | POA: Diagnosis present

## 2016-10-09 LAB — CBC WITH DIFFERENTIAL/PLATELET
BASOS PCT: 0 %
Basophils Absolute: 0 10*3/uL (ref 0.0–0.1)
EOS ABS: 0.1 10*3/uL (ref 0.0–0.7)
Eosinophils Relative: 1 %
HCT: 32.2 % — ABNORMAL LOW (ref 39.0–52.0)
HEMOGLOBIN: 10.2 g/dL — AB (ref 13.0–17.0)
Lymphocytes Relative: 15 %
Lymphs Abs: 0.8 10*3/uL (ref 0.7–4.0)
MCH: 33.9 pg (ref 26.0–34.0)
MCHC: 31.7 g/dL (ref 30.0–36.0)
MCV: 107 fL — ABNORMAL HIGH (ref 78.0–100.0)
Monocytes Absolute: 0.4 10*3/uL (ref 0.1–1.0)
Monocytes Relative: 7 %
NEUTROS PCT: 77 %
Neutro Abs: 4 10*3/uL (ref 1.7–7.7)
Platelets: ADEQUATE 10*3/uL (ref 150–400)
RBC: 3.01 MIL/uL — AB (ref 4.22–5.81)
RDW: 14.3 % (ref 11.5–15.5)
WBC: 5.2 10*3/uL (ref 4.0–10.5)

## 2016-10-09 LAB — COMPREHENSIVE METABOLIC PANEL
ALK PHOS: 80 U/L (ref 38–126)
ALT: 37 U/L (ref 17–63)
AST: 21 U/L (ref 15–41)
Albumin: 3.2 g/dL — ABNORMAL LOW (ref 3.5–5.0)
Anion gap: 9 (ref 5–15)
BUN: 88 mg/dL — ABNORMAL HIGH (ref 6–20)
CALCIUM: 8.6 mg/dL — AB (ref 8.9–10.3)
CO2: 37 mmol/L — ABNORMAL HIGH (ref 22–32)
CREATININE: 4.39 mg/dL — AB (ref 0.61–1.24)
Chloride: 93 mmol/L — ABNORMAL LOW (ref 101–111)
GFR, EST AFRICAN AMERICAN: 16 mL/min — AB (ref >60)
GFR, EST NON AFRICAN AMERICAN: 14 mL/min — AB (ref >60)
Glucose, Bld: 171 mg/dL — ABNORMAL HIGH (ref 65–99)
Potassium: 5 mmol/L (ref 3.5–5.1)
Sodium: 139 mmol/L (ref 135–145)
Total Bilirubin: 0.8 mg/dL (ref 0.3–1.2)
Total Protein: 6.9 g/dL (ref 6.5–8.1)

## 2016-10-09 LAB — GLUCOSE, CAPILLARY: GLUCOSE-CAPILLARY: 159 mg/dL — AB (ref 65–99)

## 2016-10-09 LAB — BLOOD GAS, VENOUS
ACID-BASE EXCESS: 7.6 mmol/L — AB (ref 0.0–2.0)
Bicarbonate: 29.6 mmol/L — ABNORMAL HIGH (ref 20.0–28.0)
O2 CONTENT: 2 L/min
O2 SAT: 89.8 %
pCO2, Ven: 79.6 mmHg (ref 44.0–60.0)
pH, Ven: 7.259 (ref 7.250–7.430)
pO2, Ven: 65.3 mmHg — ABNORMAL HIGH (ref 32.0–45.0)

## 2016-10-09 LAB — URINALYSIS, ROUTINE W REFLEX MICROSCOPIC
BILIRUBIN URINE: NEGATIVE
Glucose, UA: NEGATIVE mg/dL
Hgb urine dipstick: NEGATIVE
Ketones, ur: NEGATIVE mg/dL
Leukocytes, UA: NEGATIVE
NITRITE: NEGATIVE
PROTEIN: NEGATIVE mg/dL
SPECIFIC GRAVITY, URINE: 1.008 (ref 1.005–1.030)
pH: 5 (ref 5.0–8.0)

## 2016-10-09 LAB — SODIUM, URINE, RANDOM: SODIUM UR: 66 mmol/L

## 2016-10-09 LAB — CREATININE, URINE, RANDOM: CREATININE, URINE: 71.63 mg/dL

## 2016-10-09 MED ORDER — INSULIN ASPART 100 UNIT/ML ~~LOC~~ SOLN
12.0000 [IU] | Freq: Three times a day (TID) | SUBCUTANEOUS | Status: DC
  Administered 2016-10-10 – 2016-10-11 (×6): 12 [IU] via SUBCUTANEOUS

## 2016-10-09 MED ORDER — MELATONIN 5 MG PO CAPS: 1.0000 | ORAL_CAPSULE | Freq: Every day | ORAL | Status: DC

## 2016-10-09 MED ORDER — POLYETHYLENE GLYCOL 3350 17 G PO PACK: 17.0000 g | PACK | Freq: Every day | ORAL | Status: DC | PRN

## 2016-10-09 MED ORDER — ATENOLOL 25 MG PO TABS
25.0000 mg | ORAL_TABLET | Freq: Every day | ORAL | Status: DC
  Administered 2016-10-10 – 2016-10-11 (×2): 25 mg via ORAL
  Filled 2016-10-09 (×2): qty 1

## 2016-10-09 MED ORDER — GUAIFENESIN ER 600 MG PO TB12
600.0000 mg | ORAL_TABLET | Freq: Two times a day (BID) | ORAL | Status: DC
  Administered 2016-10-09 – 2016-10-11 (×4): 600 mg via ORAL
  Filled 2016-10-09 (×4): qty 1

## 2016-10-09 MED ORDER — CITALOPRAM HYDROBROMIDE 20 MG PO TABS
40.0000 mg | ORAL_TABLET | Freq: Every day | ORAL | Status: DC
  Administered 2016-10-09 – 2016-10-10 (×2): 40 mg via ORAL
  Filled 2016-10-09: qty 1
  Filled 2016-10-09: qty 2
  Filled 2016-10-09: qty 1

## 2016-10-09 MED ORDER — ACETAMINOPHEN 650 MG RE SUPP: 650.0000 mg | Freq: Four times a day (QID) | RECTAL | Status: DC | PRN

## 2016-10-09 MED ORDER — LEVOTHYROXINE SODIUM 50 MCG PO TABS
25.0000 ug | ORAL_TABLET | Freq: Every day | ORAL | Status: DC
  Administered 2016-10-10 – 2016-10-11 (×2): 25 ug via ORAL
  Filled 2016-10-09 (×2): qty 1

## 2016-10-09 MED ORDER — CITALOPRAM HYDROBROMIDE 20 MG PO TABS
ORAL_TABLET | ORAL | Status: AC
  Filled 2016-10-09: qty 2

## 2016-10-09 MED ORDER — GABAPENTIN 300 MG PO CAPS
300.0000 mg | ORAL_CAPSULE | Freq: Three times a day (TID) | ORAL | Status: DC
  Administered 2016-10-09 – 2016-10-11 (×6): 300 mg via ORAL
  Filled 2016-10-09 (×6): qty 1

## 2016-10-09 MED ORDER — ONDANSETRON HCL 4 MG PO TABS: 4.0000 mg | ORAL_TABLET | Freq: Four times a day (QID) | ORAL | Status: DC | PRN

## 2016-10-09 MED ORDER — TRAZODONE HCL 50 MG PO TABS
150.0000 mg | ORAL_TABLET | Freq: Every day | ORAL | Status: DC
  Administered 2016-10-09 – 2016-10-10 (×2): 150 mg via ORAL
  Filled 2016-10-09 (×2): qty 3

## 2016-10-09 MED ORDER — BUSPIRONE HCL 15 MG PO TABS
7.5000 mg | ORAL_TABLET | Freq: Two times a day (BID) | ORAL | Status: DC
  Administered 2016-10-10 – 2016-10-11 (×3): 7.5 mg via ORAL
  Filled 2016-10-09 (×3): qty 2

## 2016-10-09 MED ORDER — CYCLOBENZAPRINE HCL 10 MG PO TABS
10.0000 mg | ORAL_TABLET | Freq: Three times a day (TID) | ORAL | Status: DC | PRN
  Administered 2016-10-10: 10 mg via ORAL
  Filled 2016-10-09: qty 1

## 2016-10-09 MED ORDER — SENNOSIDES-DOCUSATE SODIUM 8.6-50 MG PO TABS
1.0000 | ORAL_TABLET | Freq: Every day | ORAL | Status: DC
  Administered 2016-10-10 – 2016-10-11 (×2): 1 via ORAL
  Filled 2016-10-09 (×2): qty 1

## 2016-10-09 MED ORDER — RIFAXIMIN 200 MG PO TABS
200.0000 mg | ORAL_TABLET | Freq: Every day | ORAL | Status: DC
  Administered 2016-10-10 – 2016-10-11 (×2): 200 mg via ORAL
  Filled 2016-10-09 (×4): qty 1

## 2016-10-09 MED ORDER — IPRATROPIUM-ALBUTEROL 0.5-2.5 (3) MG/3ML IN SOLN
3.0000 mL | Freq: Three times a day (TID) | RESPIRATORY_TRACT | Status: DC
  Administered 2016-10-09 – 2016-10-11 (×6): 3 mL via RESPIRATORY_TRACT
  Filled 2016-10-09 (×6): qty 3

## 2016-10-09 MED ORDER — SODIUM CHLORIDE 0.9 % IV SOLN
INTRAVENOUS | Status: AC
  Administered 2016-10-09: 22:00:00 via INTRAVENOUS

## 2016-10-09 MED ORDER — ARIPIPRAZOLE 10 MG PO TABS
20.0000 mg | ORAL_TABLET | Freq: Every day | ORAL | Status: DC
  Administered 2016-10-10 – 2016-10-11 (×2): 20 mg via ORAL
  Filled 2016-10-09 (×2): qty 2

## 2016-10-09 MED ORDER — ONDANSETRON HCL 4 MG/2ML IJ SOLN: 4.0000 mg | Freq: Four times a day (QID) | INTRAMUSCULAR | Status: DC | PRN

## 2016-10-09 MED ORDER — SODIUM CHLORIDE 0.9 % IV BOLUS (SEPSIS)
500.0000 mL | Freq: Once | INTRAVENOUS | Status: AC
  Administered 2016-10-09: 500 mL via INTRAVENOUS

## 2016-10-09 MED ORDER — TRAMADOL HCL 50 MG PO TABS
50.0000 mg | ORAL_TABLET | Freq: Four times a day (QID) | ORAL | Status: DC | PRN
  Administered 2016-10-10 – 2016-10-11 (×2): 50 mg via ORAL
  Filled 2016-10-09 (×2): qty 1

## 2016-10-09 MED ORDER — PANTOPRAZOLE SODIUM 40 MG PO TBEC
40.0000 mg | DELAYED_RELEASE_TABLET | Freq: Every day | ORAL | Status: DC
  Administered 2016-10-10 – 2016-10-11 (×2): 40 mg via ORAL
  Filled 2016-10-09 (×2): qty 1

## 2016-10-09 MED ORDER — NICOTINE 14 MG/24HR TD PT24
14.0000 mg | MEDICATED_PATCH | Freq: Every day | TRANSDERMAL | Status: DC
  Administered 2016-10-10 – 2016-10-11 (×2): 14 mg via TRANSDERMAL
  Filled 2016-10-09 (×3): qty 1

## 2016-10-09 MED ORDER — ACETAMINOPHEN 325 MG PO TABS: 650.0000 mg | ORAL_TABLET | Freq: Four times a day (QID) | ORAL | Status: DC | PRN

## 2016-10-09 MED ORDER — FLUTICASONE FUROATE-VILANTEROL 100-25 MCG/INH IN AEPB
1.0000 | INHALATION_SPRAY | Freq: Every day | RESPIRATORY_TRACT | Status: DC
  Administered 2016-10-10 – 2016-10-11 (×2): 1 via RESPIRATORY_TRACT
  Filled 2016-10-09: qty 28

## 2016-10-09 MED ORDER — LACTULOSE 10 GM/15ML PO SOLN
13.3000 g | Freq: Three times a day (TID) | ORAL | Status: DC
  Administered 2016-10-10 – 2016-10-11 (×5): 13.3 g via ORAL
  Filled 2016-10-09 (×5): qty 30

## 2016-10-09 MED ORDER — ASPIRIN EC 81 MG PO TBEC
81.0000 mg | DELAYED_RELEASE_TABLET | Freq: Every day | ORAL | Status: DC
  Administered 2016-10-10 – 2016-10-11 (×2): 81 mg via ORAL
  Filled 2016-10-09 (×4): qty 1

## 2016-10-09 MED ORDER — CALCITRIOL 0.25 MCG PO CAPS
0.2500 ug | ORAL_CAPSULE | Freq: Two times a day (BID) | ORAL | Status: DC
  Administered 2016-10-10 – 2016-10-11 (×4): 0.25 ug via ORAL
  Filled 2016-10-09 (×4): qty 1

## 2016-10-09 MED ORDER — INSULIN ASPART 100 UNIT/ML ~~LOC~~ SOLN
0.0000 [IU] | Freq: Three times a day (TID) | SUBCUTANEOUS | Status: DC
  Administered 2016-10-10: 3 [IU] via SUBCUTANEOUS
  Administered 2016-10-10: 2 [IU] via SUBCUTANEOUS
  Administered 2016-10-11 (×2): 3 [IU] via SUBCUTANEOUS

## 2016-10-09 MED ORDER — INSULIN ASPART 100 UNIT/ML ~~LOC~~ SOLN: 0.0000 [IU] | Freq: Every day | SUBCUTANEOUS | Status: DC

## 2016-10-09 MED ORDER — INSULIN GLARGINE 100 UNIT/ML ~~LOC~~ SOLN
60.0000 [IU] | Freq: Two times a day (BID) | SUBCUTANEOUS | Status: DC
  Administered 2016-10-09 – 2016-10-11 (×4): 60 [IU] via SUBCUTANEOUS
  Filled 2016-10-09 (×6): qty 0.6

## 2016-10-09 NOTE — ED Triage Notes (Signed)
Patient brought from [place] with BUN - 84. Patient states he has voided one time today.

## 2016-10-09 NOTE — ED Notes (Signed)
Critical values  Venous  PH = 7.25 PCO2 = 79.6 PO2 = 65.3 Bicarb = 25.6

## 2016-10-09 NOTE — H&P (Signed)
History and Physical    Jorge [name] JME:[id number] DOB: 11-24-1965 DOA: 10/09/2016  PCP: Hilbert Corrigan, MD   Patient coming from: [facility]  Chief Complaint: Lab abnormalities  HPI: Jorge [name] is a 51 y.o. male with medical history significant for insulin-dependent diabetes mellitus, hypertension, chronic kidney disease stage III, COPD, liver cirrhosis, obstructive sleep apnea, and depression with anxiety who presents to the emergency department from his nursing facility for evaluation of abnormal blood work. Patient had been admitted to the hospital from 09/20/2016 until 09/24/2016 for acute on chronic hypercarbic respiratory failure and was found to have an acute kidney injury superimposed on his CKD III, with creatinine in the 4-range, improved to 1.60 by time of discharge after his ACEi and diuretics were held. He reports increased fatigue and lethargy over the past week, but denies any other complaints. He denies any significant increase in his chronic dyspnea or cough. He does note a decrease in his urine output, but denies any vomiting or diarrhea. He denies fevers or chills, denies abdominal pain or flank pain, and denies dysuria.  ED Course: Upon arrival to the ED, patient is found to be afebrile, saturating adequately on his usual 3 L/m supplemental oxygen, and with blood pressure soft but stable. EKG features a sinus rhythm with first degree AV nodal block and low voltage QRS. Chest x-ray is notable for mild left basilar atelectasis. Chemistry panel features a bicarbonate of 37, slightly higher than priors, and a serum creatinine of 4.39, up from 1.60 two weeks earlier. CBC is notable for a stable macrocytic anemia with hemoglobin of 10.2. Patient was given a 500 mL normal saline bolus in the emergency department, remained hemodynamically stable and in no acute respiratory distress, and will be admitted to the telemetry unit for ongoing evaluation and management of acute  kidney injury superimposed on chronic kidney disease stage III.  Review of Systems:  All other systems reviewed and apart from HPI, are negative.  Past Medical History:  Diagnosis Date  . Cirrhosis ([place]) sleep apnea  . COPD (chronic obstructive pulmonary disease) ([place])   . Diabetes mellitus without complication ([place])   . Hypertension   . Renal disorder    stage II  . Renal insufficiency   . Sleep apnea   . Suicide attempt ([place])   . Syncope   . Tobacco abuse     Past Surgical History:  Procedure Laterality Date  . CHOLECYSTECTOMY    . LEG SURGERY Left      reports that he quit smoking about 11 months ago. His smoking use included Cigarettes. He smoked 0.50 packs per day. He has never used smokeless tobacco. He reports that he does not drink alcohol or use drugs.  Allergies  Allergen Reactions  . Penicillins Anaphylaxis    Patient states he is not allergic to anything;  Pt says it's his twin brother that has this allergy.    Family History  Problem Relation Age of Onset  . Heart disease Father   . Cancer Mother        breast     Prior to Admission medications   Medication Sig Start Date End Date Taking? Authorizing Provider  ARIPiprazole (ABILIFY) 20 MG tablet Take 20 mg by mouth daily.   Yes [provider]  aspirin 81 MG tablet Take 81 mg by mouth daily.   Yes [provider]  atenolol (TENORMIN) 25 MG tablet Take 25 mg by mouth daily.    Yes [provider]  busPIRone (BUSPAR) 7.5 MG tablet Take 7.5 mg by mouth 2 (two) times daily. 3 day dose to be completed 09/22/2016   Yes [provider]  calcitRIOL (ROCALTROL) 0.25 MCG capsule Take 1 capsule (0.25 mcg total) by mouth 2 (two) times daily with a meal. 08/03/16  Yes Nida, Marella Chimes, MD  calcium citrate (CALCITRATE - DOSED IN MG ELEMENTAL CALCIUM) 950 MG tablet Take 1 tablet by mouth daily.   Yes [provider]  cholecalciferol (VITAMIN D) 1000 UNITS tablet Take  1,000 Units by mouth daily. Reported on 05/03/2015   Yes [provider]  citalopram (CELEXA) 40 MG tablet Take 40 mg by mouth at bedtime.  12/03/13  Yes [provider]  cyclobenzaprine (FLEXERIL) 10 MG tablet Take 1 tablet (10 mg total) by mouth 3 (three) times daily as needed (muscle soreness). 06/15/16  Yes Rolland Porter, MD  enalapril (VASOTEC) 2.5 MG tablet Take 2.5 mg by mouth daily.   Yes [provider]  fluticasone furoate-vilanterol (BREO ELLIPTA) 100-25 MCG/INH AEPB Inhale 1 puff into the lungs daily.   Yes [provider]  gabapentin (NEURONTIN) 300 MG capsule Take 300 mg by mouth 3 (three) times daily. 12/03/13  Yes [provider]  guaiFENesin (MUCINEX) 600 MG 12 hr tablet Take 600 mg by mouth 2 (two) times daily.    Yes [provider]  insulin aspart (NOVOLOG FLEXPEN) 100 UNIT/ML FlexPen Inject 15-20 Units into the skin 4 (four) times daily -  before meals and at bedtime. 15 units twice daily at 730a and 1130a, then take 20 units at 1700 in addition to sliding scale  150-200=2units Call np/md if less than 70 201-250=4units 251-300=6units 301-350=8units 351-400=10units Greater than 400 call np and md   Yes [provider]  Insulin Glargine (LANTUS SOLOSTAR) 100 UNIT/ML Solostar Pen Inject 60 Units into the skin 2 (two) times daily.    Yes [provider]  ipratropium-albuterol (DUONEB) 0.5-2.5 (3) MG/3ML SOLN Take 3 mLs by nebulization 3 (three) times daily. Patient taking differently: Take 3 mLs by nebulization 3 (three) times daily. *May also take every 4 hours as needed for shortness of breath 11/01/14  Yes Fanta, Brandon Melnick, MD  iron polysaccharides (NU-IRON) 150 MG capsule Take 150 mg by mouth daily.   Yes [provider]  lactulose, encephalopathy, (CHRONULAC) 10 GM/15ML SOLN Take 14.5 g by mouth 3 (three) times daily. 78mls 3 times daily   Yes [provider]  levothyroxine (SYNTHROID,  LEVOTHROID) 25 MCG tablet Take 25 mcg by mouth daily before breakfast.   Yes [provider]  Melatonin 5 MG CAPS Take 1 capsule by mouth at bedtime.   Yes [provider]  metolazone (ZAROXOLYN) 2.5 MG tablet Take 2.5 mg by mouth every Monday, Wednesday, and Friday.   Yes [provider]  nicotine (NICODERM CQ - DOSED IN MG/24 HOURS) 14 mg/24hr patch Place 14 mg onto the skin daily.   Yes [provider]  nitroGLYCERIN (NITROSTAT) 0.4 MG SL tablet Place 0.4 mg under the tongue every 5 (five) minutes as needed for chest pain.   Yes [provider]  omeprazole (PRILOSEC) 20 MG capsule Take 20 mg by mouth daily.   Yes [provider]  OXYGEN Inhale 3 L into the lungs daily as needed (for breathing).    Yes [provider]  PROAIR HFA 108 (90 BASE) MCG/ACT inhaler Inhale 2 puffs into the lungs every 6 (six) hours as needed for wheezing or shortness  of breath. Shortness of breath/wheeze 12/05/13  Yes [provider]  rifaximin (XIFAXAN) 200 MG tablet Take 200 mg by mouth daily.   Yes [provider]  sennosides-docusate sodium (SENOKOT-S) 8.6-50 MG tablet Take 1 tablet by mouth daily.    Yes [provider]  sitaGLIPtin (JANUVIA) 25 MG tablet Take 25 mg by mouth daily.   Yes [provider]  torsemide (DEMADEX) 20 MG tablet Take 20-40 mg by mouth See admin instructions. On Saturdays, Mondays, and Wednesdays take 20mg  and 40mg  on Sundays, Tuesdays, and Thursdays   Yes [provider]  traMADol (ULTRAM) 50 MG tablet Take 1 tablet (50 mg total) by mouth every 6 (six) hours as needed for moderate pain. 09/24/16  Yes Orvan Falconer, MD  traZODone (DESYREL) 50 MG tablet Take 150 mg by mouth at bedtime.   Yes [provider]  Vitamin D, Ergocalciferol, (DRISDOL) 50000 units CAPS capsule Take 50,000 Units by mouth every 30 (thirty) days.   Yes [provider]  polyethylene glycol (MIRALAX /  GLYCOLAX) packet Take 17 g by mouth daily as needed for mild constipation or moderate constipation.     [provider]    Physical Exam: Vitals:   10/09/16 1538 10/09/16 1730 10/09/16 1815  BP: 104/69 93/76   Pulse: 87 66 73  Resp: (!) 26 11 15   Temp: 98.5 F (36.9 C)    TempSrc: Oral    SpO2: 92% 96% 95%  Weight: (!) 195 kg (430 lb)    Height: 6\' 3"  (1.905 m)        Constitutional: NAD, obese, listless. Appears older than stated age.  Eyes: PERTLA, lids and conjunctivae normal ENMT: Mucous membranes are moist. Posterior pharynx clear of any exudate or lesions.   Neck: normal, supple, no masses, no thyromegaly Respiratory: Diminished bilaterally. Normal respiratory effort. No accessory muscle use.  Cardiovascular: S1 & S2 heard, regular rate and rhythm. JVP not well-visualized.  Abdomen: No distension, no tenderness, no masses palpated. Bowel sounds normal.  Musculoskeletal: no clubbing / cyanosis. No joint deformity upper and lower extremities.  Skin: hyperpigmentation and chronic superficial ulcerations at bilaterally LE's distally in gaiter distribution. Otherwise warm, dry, well-perfused. Neurologic: CN 2-12 grossly intact. Sensation intact, DTR normal.   Psychiatric: Alert and oriented x 3. Calm and cooperative.     Labs on Admission: I have personally reviewed following labs and imaging studies  CBC:  Recent Labs Lab 10/09/16 1620  WBC 5.2  NEUTROABS 4.0  HGB 10.2*  HCT 32.2*  MCV 107.0*  PLT PLATELET CLUMPS NOTED ON SMEAR, COUNT APPEARS ADEQUATE   Basic Metabolic Panel:  Recent Labs Lab 10/09/16 1620  NA 139  K 5.0  CL 93*  CO2 37*  GLUCOSE 171*  BUN 88*  CREATININE 4.39*  CALCIUM 8.6*   GFR: Estimated Creatinine Clearance: 35.8 mL/min (A) (by C-G formula based on SCr of 4.39 mg/dL (H)). Liver Function Tests:  Recent Labs Lab 10/09/16 1620  AST 21  ALT 37  ALKPHOS 80  BILITOT 0.8  PROT 6.9  ALBUMIN 3.2*   No results for  input(s): LIPASE, AMYLASE in the last 168 hours. No results for input(s): AMMONIA in the last 168 hours. Coagulation Profile: No results for input(s): INR, PROTIME in the last 168 hours. Cardiac Enzymes: No results for input(s): CKTOTAL, CKMB, CKMBINDEX, TROPONINI in the last 168 hours. BNP (last 3 results) No results for input(s): PROBNP in the last 8760 hours. HbA1C: No results for input(s): HGBA1C in the  last 72 hours. CBG: No results for input(s): GLUCAP in the last 168 hours. Lipid Profile: No results for input(s): CHOL, HDL, LDLCALC, TRIG, CHOLHDL, LDLDIRECT in the last 72 hours. Thyroid Function Tests: No results for input(s): TSH, T4TOTAL, FREET4, T3FREE, THYROIDAB in the last 72 hours. Anemia Panel: No results for input(s): VITAMINB12, FOLATE, FERRITIN, TIBC, IRON, RETICCTPCT in the last 72 hours. Urine analysis:    Component Value Date/Time   COLORURINE STRAW (A) 09/23/2016 1215   APPEARANCEUR CLEAR 09/23/2016 1215   APPEARANCEUR Clear 03/25/2013 1630   LABSPEC 1.011 09/23/2016 1215   LABSPEC 1.023 03/25/2013 1630   PHURINE 5.0 09/23/2016 1215   GLUCOSEU NEGATIVE 09/23/2016 1215   GLUCOSEU Negative 03/25/2013 1630   HGBUR NEGATIVE 09/23/2016 1215   BILIRUBINUR NEGATIVE 09/23/2016 1215   BILIRUBINUR Negative 03/25/2013 1630   KETONESUR NEGATIVE 09/23/2016 1215   PROTEINUR NEGATIVE 09/23/2016 1215   UROBILINOGEN 4.0 (H) 12/21/2014 1618   NITRITE NEGATIVE 09/23/2016 1215   LEUKOCYTESUR NEGATIVE 09/23/2016 1215   LEUKOCYTESUR Negative 03/25/2013 1630   Sepsis Labs: @LABRCNTIP (procalcitonin:4,lacticidven:4) )No results found for this or any previous visit (from the past 240 hour(s)).   Radiological Exams on Admission: Dg Chest Portable 1 View  Result Date: 10/09/2016 CLINICAL DATA:  Renal failure. EXAM: PORTABLE CHEST 1 VIEW COMPARISON:  Radiographs of Sep 20, 2016. FINDINGS: Stable cardiomegaly. No pneumothorax is noted. Right lung is clear. Left basilar opacity is  noted concerning for atelectasis or infiltrate with associated pleural effusion. Bony thorax is unremarkable. IMPRESSION: Mild left basilar atelectasis or infiltrate is noted. Electronically Signed   By: Marijo Conception, M.D.   On: 10/09/2016 16:21    EKG: Independently reviewed. Sinus rhythm, 1st degree AV block, low-voltage QRS.   Assessment/Plan  1. Acute kidney injury superimposed on CKD stage III  - SCr is 4.39 on admission, up from 1.60 two weeks earlier  - Uncertain etiology, will check renal US and urine studies  - He was given 500 cc NS in ED and will be continued on gentle IVF hydration  - Avoid nephrotoxins, hold enalapril and diuretics, renally-dose medications, repeat chem panel in am    2. COPD with chronic hypercapnic respiratory failure  - Dyspneic with minimal exertion at baseline; stable on admission  - Continue Breo, nebs, supplemental O2   3. Cirrhosis  - Appears well-compensated  - Continue lactulose, rifaximin, PPI, atenolol; hold diuretics as above  4. Insulin-dependent DM  - A1c was only 5.0% in 2014 (no more recent values available)  - Managed at nursing home with Lanuts 60 units BID, Novolog 15 units with breakfast and lunch, 20 units with dinner, and Novolog 0-10 unit sliding-scale  - Check CBG with meals and qHS - Continue Lantus 60 units BID with Novolog 12 units TID and per sliding-scale   5. Hypertension  - BP soft in ED  - Hold enalapril and diuretics, continue atenolol as tolerated    6. OSA  - Managed with BiPAP qHS, will continue    7. Macrocytic anemia  - Hgb is 10.2 and MCV 107.0 on admission; both indices stable  - No apparent bleeding, will monitor    DVT prophylaxis: SCD's  Code Status: Full  Family Communication: Discussed with patient Disposition Plan: Admit to telemetry Consults called: None Admission status: Inpatient    Vianne Bulls, MD [facility] Pager [phone]  If 7PM-7AM, please contact  night-coverage [email or web address] Password [email or web address]  10/09/2016, 7:58 PM

## 2016-10-09 NOTE — ED Provider Notes (Signed)
[facility] DEPT Provider Note   CSN: [id number] Arrival date & time: 10/09/16  1530     History   Chief Complaint Chief Complaint  Patient presents with  . Abnormal Lab    HPI Jorge [name] is a 51 y.o. male.  HPI Patient presents to the emergency room for evaluation of worsening kidney failure. Patient states he has not been urinating as much. He has noticed some increased swelling of his legs. As any trouble with any chest pain or shortness of breath. He denies any dysuria or frequency. Patient has history of multiple medical problems including cirrhosis, COPD, diabetes, chronic kidney disease. The patient is a resident of a nursing facility. He had repeat laboratory tests done in the last day or so. It showed significant increase in his BUN and creatinine. The only change in his medications that he is aware of is a change in his pain medications. He was sent to the emergency room for further evaluation. Past Medical History:  Diagnosis Date  . Cirrhosis ([place]) sleep apnea  . COPD (chronic obstructive pulmonary disease) ([place])   . Diabetes mellitus without complication ([place])   . Hypertension   . Renal disorder    stage II  . Renal insufficiency   . Sleep apnea   . Suicide attempt ([place])   . Syncope   . Tobacco abuse     Patient Active Problem List   Diagnosis Date Noted  . Respiratory failure, acute and chronic ([place]) 09/21/2016  . Other hyperparathyroidism ([place]) 08/03/2016  . Hypocalcemia 08/03/2016  . DNR (do not resuscitate) 12/13/2015  . Diabetes mellitus ([place]) 12/13/2015  . Pressure ulcer 12/23/2014  . Syncope 12/23/2014  . Anemia 12/21/2014  . Thrombocytopenia ([place]) 10/27/2014  . Hyperglycemia 10/27/2014  . COPD with exacerbation ([place]) 10/27/2014  . Acute on chronic respiratory failure with hypoxia ([place]) 10/27/2014  . Sleep apnea   . Tobacco abuse   . Insomnia 10/03/2014  . Chronic pain 10/03/2014  . GERD without esophagitis 10/03/2014  . Chronic  obstructive pulmonary disease with acute exacerbation ([place])   . Renal insufficiency   . Pain in the chest   . Essential hypertension   . COPD exacerbation ([place]) 08/29/2014  . Chest pain 08/29/2014  . CKD (chronic kidney disease) stage 3, GFR 30-59 ml/min 08/29/2014  . Acute respiratory failure with hypoxia ([place]) 08/29/2014  . Cirrhosis ([place])   . Hypertension     Past Surgical History:  Procedure Laterality Date  . CHOLECYSTECTOMY    . LEG SURGERY Left        Home Medications    Prior to Admission medications   Medication Sig Start Date End Date Taking? Authorizing Provider  ARIPiprazole (ABILIFY) 20 MG tablet Take 20 mg by mouth daily.    [provider]  aspirin 81 MG tablet Take 81 mg by mouth daily.    [provider]  atenolol (TENORMIN) 25 MG tablet Take 25 mg by mouth daily.     [provider]  busPIRone (BUSPAR) 7.5 MG tablet Take 7.5 mg by mouth 2 (two) times daily. 3 day dose to be completed 09/22/2016    [provider]  calcitRIOL (ROCALTROL) 0.25 MCG capsule Take 1 capsule (0.25 mcg total) by mouth 2 (two) times daily with a meal. 08/03/16   Nida, Marella Chimes, MD  calcium citrate (CALCITRATE - DOSED IN MG ELEMENTAL CALCIUM) 950 MG tablet Take 1 tablet by mouth daily.    [provider]  cholecalciferol (VITAMIN D) 1000  UNITS tablet Take 1,000 Units by mouth daily. Reported on 05/03/2015    [provider]  citalopram (CELEXA) 40 MG tablet Take 40 mg by mouth at bedtime.  12/03/13   [provider]  cyclobenzaprine (FLEXERIL) 10 MG tablet Take 1 tablet (10 mg total) by mouth 3 (three) times daily as needed (muscle soreness). 06/15/16   Rolland Porter, MD  diphenhydrAMINE (BENADRYL) 25 MG tablet Take 25 mg by mouth at bedtime as needed for sleep.    [provider]  enalapril (VASOTEC) 2.5 MG tablet Take 2.5 mg by mouth daily.    [provider]  Fe Bisgly-Succ-C-Thre-B12-FA (IRON-150 PO) Take 1  tablet by mouth daily.    [provider]  fluticasone furoate-vilanterol (BREO ELLIPTA) 100-25 MCG/INH AEPB Inhale 1 puff into the lungs daily.    [provider]  gabapentin (NEURONTIN) 300 MG capsule Take 300 mg by mouth 3 (three) times daily. 12/03/13   [provider]  guaiFENesin (MUCINEX) 600 MG 12 hr tablet Take 600 mg by mouth 2 (two) times daily.     [provider]  insulin aspart (NOVOLOG FLEXPEN) 100 UNIT/ML FlexPen Inject 15-20 Units into the skin 4 (four) times daily -  before meals and at bedtime. 15 units twice daily at 730a and 1130a, then take 20 units at 1700 in addition to sliding scale  150-200=2units Call np/md if less than 70 201-250=4units 251-300=6units 301-350=8units 351-400=10units Greater than 400 call np and md    [provider]  Insulin Glargine (LANTUS SOLOSTAR) 100 UNIT/ML Solostar Pen Inject 60 Units into the skin 2 (two) times daily.     [provider]  ipratropium-albuterol (DUONEB) 0.5-2.5 (3) MG/3ML SOLN Take 3 mLs by nebulization 3 (three) times daily. Patient taking differently: Take 3 mLs by nebulization 3 (three) times daily. *May also take every 4 hours as needed for shortness of breath 11/01/14   Rosita Fire, MD  lactulose, encephalopathy, (CHRONULAC) 10 GM/15ML SOLN Take 14.5 g by mouth 3 (three) times daily. 52mls 3 times daily    [provider]  levothyroxine (SYNTHROID, LEVOTHROID) 25 MCG tablet Take 25 mcg by mouth daily before breakfast.    [provider]  Melatonin 5 MG CAPS Take 1 capsule by mouth at bedtime.    [provider]  metolazone (ZAROXOLYN) 2.5 MG tablet Take 2.5 mg by mouth every Monday, Wednesday, and Friday.    [provider]  nicotine (NICODERM CQ - DOSED IN MG/24 HOURS) 14 mg/24hr patch Place 14 mg onto the skin daily.    [provider]  nitroGLYCERIN (NITROSTAT) 0.4 MG SL tablet Place 0.4 mg under the tongue every 5 (five)  minutes as needed for chest pain.    [provider]  omeprazole (PRILOSEC) 20 MG capsule Take 20 mg by mouth daily.    [provider]  OXYGEN Inhale 3 L into the lungs daily as needed (for breathing).     [provider]  polyethylene glycol (MIRALAX / GLYCOLAX) packet Take 17 g by mouth daily as needed for mild constipation or moderate constipation.     [provider]  predniSONE (DELTASONE) 20 MG tablet Take 2 tablets (40 mg total) by mouth daily with breakfast. 09/24/16   Orvan Falconer, MD  PROAIR HFA 108 (90 BASE) MCG/ACT inhaler Inhale 2 puffs into the lungs every 6 (six) hours as needed for wheezing or shortness of breath. Shortness of breath/wheeze 12/05/13   [provider]  rifaximin Doreene Nest) 200  MG tablet Take 200 mg by mouth daily.    [provider]  sennosides-docusate sodium (SENOKOT-S) 8.6-50 MG tablet Take 1 tablet by mouth daily.     [provider]  sitaGLIPtin (JANUVIA) 25 MG tablet Take 25 mg by mouth daily.    [provider]  torsemide (DEMADEX) 20 MG tablet Take 40 mg by mouth daily.     [provider]  traMADol (ULTRAM) 50 MG tablet Take 1 tablet (50 mg total) by mouth every 6 (six) hours as needed for moderate pain. 09/24/16   Orvan Falconer, MD  traZODone (DESYREL) 50 MG tablet Take 150 mg by mouth at bedtime.    [provider]  Vitamin D, Ergocalciferol, (DRISDOL) 50000 units CAPS capsule Take 50,000 Units by mouth every 30 (thirty) days.    [provider]    Family History Family History  Problem Relation Age of Onset  . Heart disease Father   . Cancer Mother        breast    Social History Social History  Substance Use Topics  . Smoking status: Former Smoker    Packs/day: 0.50    Types: Cigarettes    Quit date: 11/09/2015  . Smokeless tobacco: Never Used     Comment: slowing down  . Alcohol use No     Allergies   Penicillins   Review of Systems Review of  Systems  All other systems reviewed and are negative.    Physical Exam Updated Vital Signs BP 93/76   Pulse 73   Temp 98.5 F (36.9 C) (Oral)   Resp 15   Ht 1.905 m (6\' 3" )   Wt (!) 195 kg (430 lb)   SpO2 95%   BMI 53.75 kg/m   Physical Exam  Constitutional: No distress.  Appears listless, but is alert and answering questions appropriately  HENT:  Head: Normocephalic and atraumatic.  Right Ear: External ear normal.  Left Ear: External ear normal.  Eyes: Conjunctivae are normal. Right eye exhibits no discharge. Left eye exhibits no discharge. No scleral icterus.  Neck: Neck supple. No tracheal deviation present.  Cardiovascular: Normal rate, regular rhythm and intact distal pulses.   Pulmonary/Chest: Effort normal and breath sounds normal. No stridor. No respiratory distress. He has no wheezes. He has no rales.  Abdominal: Soft. Bowel sounds are normal. He exhibits no distension. There is no tenderness. There is no rebound and no guarding.  Abdomen is protuberant, questionable edema of the pannus  Musculoskeletal: He exhibits edema (pitting edema bilateral lower extremities). He exhibits no tenderness.  Neurological: He is alert. He has normal strength. No cranial nerve deficit (no facial droop, extraocular movements intact, no slurred speech) or sensory deficit. He exhibits normal muscle tone. He displays no seizure activity. Coordination normal.  Skin: Skin is warm. No rash noted. He is not diaphoretic.  Refill is diminished in his extremities  Psychiatric: He has a normal mood and affect.  Nursing note and vitals reviewed.    ED Treatments / Results  Labs (all labs ordered are listed, but only abnormal results are displayed) Labs Reviewed  COMPREHENSIVE METABOLIC PANEL - Abnormal; Notable for the following:       Result Value   Chloride 93 (*)    CO2 37 (*)    Glucose, Bld 171 (*)    BUN 88 (*)    Creatinine, Ser 4.39 (*)    Calcium 8.6 (*)    Albumin 3.2 (*)     GFR calc  non Af Amer 14 (*)    GFR calc Af Amer 16 (*)    All other components within normal limits  CBC WITH DIFFERENTIAL/PLATELET - Abnormal; Notable for the following:    RBC 3.01 (*)    Hemoglobin 10.2 (*)    HCT 32.2 (*)    MCV 107.0 (*)    All other components within normal limits  BLOOD GAS, VENOUS - Abnormal; Notable for the following:    pCO2, Ven 79.6 (*)    pO2, Ven 65.3 (*)    Bicarbonate 29.6 (*)    Acid-Base Excess 7.6 (*)    All other components within normal limits  URINALYSIS, ROUTINE W REFLEX MICROSCOPIC    EKG  EKG Interpretation  Date/Time:  Monday Oct 09 2016 16:34:00 EDT Ventricular Rate:  68 PR Interval:    QRS Duration: 90 QT Interval:  407 QTC Calculation: 433 R Axis:   59 Text Interpretation:  Sinus rhythm Prolonged PR interval Low voltage, precordial leads Probable anteroseptal infarct, old No significant change since last tracing Confirmed by Dorie Rank [phone]) on 10/09/2016 6:13:36 PM       Radiology Dg Chest Portable 1 View  Result Date: 10/09/2016 CLINICAL DATA:  Renal failure. EXAM: PORTABLE CHEST 1 VIEW COMPARISON:  Radiographs of Sep 20, 2016. FINDINGS: Stable cardiomegaly. No pneumothorax is noted. Right lung is clear. Left basilar opacity is noted concerning for atelectasis or infiltrate with associated pleural effusion. Bony thorax is unremarkable. IMPRESSION: Mild left basilar atelectasis or infiltrate is noted. Electronically Signed   By: Marijo Conception, M.D.   On: 10/09/2016 16:21    Procedures Procedures (including critical care time)  Medications Ordered in ED Medications  sodium chloride 0.9 % bolus 500 mL (not administered)  sodium chloride 0.9 % bolus 500 mL (0 mLs Intravenous Stopped 10/09/16 1810)     Initial Impression / Assessment and Plan / ED Course  I have reviewed the triage vital signs and the nursing notes.  Pertinent labs & imaging results that were available during my care of the patient were reviewed by me and  considered in my medical decision making (see chart for details).  Clinical Course as of Oct 10 1830  Mon Oct 09, 2016  1830 VBG suggests hypercapnia but no acidosis.  Suspect chronic hypercapnia.  Pt hydrated with IV fluids for his acute on chronic renal failure  [JK]    Clinical Course User Index [JK] Dorie Rank, MD    Patient presents to the emergency room for evaluation of acute on chronic kidney disease. His creatinine is significantly increased from just 2 weeks ago when it was 1.6.  Patient has multiple medical problems and I suspect this is multifactorial but it may be related to his diuretics and hence Vasotec.  I have ordered IV fluid hydration. Consult the medical service for admission and further.  Final Clinical Impressions(s) / ED Diagnoses   Final diagnoses:  Acute renal failure superimposed on chronic kidney disease, unspecified CKD stage, unspecified acute renal failure type ([place])  Chronic obstructive pulmonary disease, unspecified COPD type ([place])      Dorie Rank, MD 10/09/16 [id number]

## 2016-10-09 NOTE — Plan of Care (Signed)
Problem: Pain Managment: Goal: General experience of comfort will improve Outcome: Adequate for Discharge Pt has no c/o pain this shift. Resting quietly in room. Will continue to monitor pt

## 2016-10-10 ENCOUNTER — Inpatient Hospital Stay (HOSPITAL_COMMUNITY): Payer: Medicare Other

## 2016-10-10 LAB — PHOSPHORUS: Phosphorus: 5.2 mg/dL — ABNORMAL HIGH (ref 2.5–4.6)

## 2016-10-10 LAB — GLUCOSE, CAPILLARY
Glucose-Capillary: 109 mg/dL — ABNORMAL HIGH (ref 65–99)
Glucose-Capillary: 170 mg/dL — ABNORMAL HIGH (ref 65–99)
Glucose-Capillary: 150 mg/dL — ABNORMAL HIGH (ref 65–99)
Glucose-Capillary: 127 mg/dL — ABNORMAL HIGH (ref 65–99)

## 2016-10-10 LAB — CBC
HEMATOCRIT: 32.9 % — AB (ref 39.0–52.0)
HEMOGLOBIN: 10.5 g/dL — AB (ref 13.0–17.0)
MCH: 34.3 pg — ABNORMAL HIGH (ref 26.0–34.0)
MCHC: 31.9 g/dL (ref 30.0–36.0)
MCV: 107.5 fL — ABNORMAL HIGH (ref 78.0–100.0)
Platelets: 113 10*3/uL — ABNORMAL LOW (ref 150–400)
RBC: 3.06 MIL/uL — ABNORMAL LOW (ref 4.22–5.81)
RDW: 14.3 % (ref 11.5–15.5)
WBC: 4.5 10*3/uL (ref 4.0–10.5)

## 2016-10-10 LAB — BASIC METABOLIC PANEL
ANION GAP: 9 (ref 5–15)
BUN: 84 mg/dL — ABNORMAL HIGH (ref 6–20)
CHLORIDE: 96 mmol/L — AB (ref 101–111)
CO2: 37 mmol/L — AB (ref 22–32)
CREATININE: 3.51 mg/dL — AB (ref 0.61–1.24)
Calcium: 8.4 mg/dL — ABNORMAL LOW (ref 8.9–10.3)
GFR calc non Af Amer: 19 mL/min — ABNORMAL LOW (ref >60)
GFR, EST AFRICAN AMERICAN: 22 mL/min — AB (ref >60)
Glucose, Bld: 145 mg/dL — ABNORMAL HIGH (ref 65–99)
Potassium: 4.7 mmol/L (ref 3.5–5.1)
Sodium: 142 mmol/L (ref 135–145)

## 2016-10-10 LAB — MAGNESIUM: MAGNESIUM: 2.3 mg/dL (ref 1.7–2.4)

## 2016-10-10 LAB — MRSA PCR SCREENING: MRSA by PCR: NEGATIVE

## 2016-10-10 MED ORDER — SODIUM CHLORIDE 0.9 % IV SOLN
INTRAVENOUS | Status: AC
  Administered 2016-10-10 – 2016-10-11 (×2): via INTRAVENOUS

## 2016-10-10 NOTE — Progress Notes (Signed)
Pt gave RN permission to give [facility] nurse information on diagnosis and prognosis.

## 2016-10-10 NOTE — Progress Notes (Signed)
PROGRESS NOTE    Jorge Jorge  GYI:[id number] DOB: Sep 19, 1965 DOA: 10/09/2016 PCP: Jorge Corrigan, MD   Brief Narrative:  HPI: Jorge Jorge is a 51 y.o. male with medical history significant for insulin-dependent diabetes mellitus, hypertension, chronic kidney disease stage III, COPD, liver cirrhosis, obstructive sleep apnea, and depression with anxiety - presented with abnormal blood work. Patient had been admitted to the hospital from 09/20/2016 until 09/24/2016 for acute on chronic hypercarbic respiratory failure and was found to have an acute kidney injury superimposed on his CKD III, with creatinine in the 4-range, improved to 1.60.  In the ED- EKG features a sinus rhythm with first degree AV nodal block and low voltage QRS. Chemistry panel features a bicarbonate of 37, slightly higher than priors, and a serum creatinine of 4.39, up from 1.60 two weeks earlier.  Assessment & Plan:   Principal Problem:   Acute kidney injury superimposed on CKD ([place]) Active Problems:   COPD (chronic obstructive pulmonary disease) (H[facility])   Cirrhosis ([facility])   CKD (chronic kidney disease) stage 3, GFR 30-59 ml/min   Essential hypertension   Chronic pain   GERD without esophagitis   Sleep apnea   Chronic respiratory failure with hypercapnia ([facility])   Macrocytic anemia   Diabetes mellitus ([place])   AKI (acute kidney injury) ([place])  Acute kidney injury superimposed on CKD stage III - SCr is 4.39 on admission, up from 1.60 two weeks earlier  - Continue with gentle IV fluids - AKI on CKD likely due to aggressive diuresis- metolazone and torsemide on patient's medication list, compliant as he stays in a nursing home - Renal ultrasound left kidney not visualized- recommendations for CT or nuclear medicine renal scan. - Will recommend confirming with radiology, this finding, as abdominal ultrasound 2015 and 2016- notes bilateral kidneys present. Highly doubt patient has had a nephrectomy within this  time. - Avoid nephrotoxins, hold enalapril and diuretics, renally-dose medications, repeat chem panel in am    COPD with chronic hypercapnic respiratory failure  - Dyspneic with minimal exertion at baseline; stable on admission  - Continue Breo, nebs, supplemental O2   Cirrhosis -  - Appears well-compensated  - Continue lactulose, rifaximin, PPI, atenolol; hold diuretics as above - Patient's on both diuretics metolazone and torsemide, one or both should be stopped or dose reduced significantly on discharge  Insulin-dependent DM  - A1c was only 5.0% in 2014 (no more recent values available)  - Managed at nursing home with Lanuts 60 units BID, Novolog 15 units with breakfast and lunch, 20 units with dinner, and Novolog 0-10 unit sliding-scale  - Check CBG with meals and qHS - Continue Lantus 60 units BID with Novolog 12 units TID and per sliding-scale   Hypertension  - BP soft in ED  - Hold enalapril and diuretics, AKI, continue atenolol as tolerated    OSA  Managed with BiPAP qHS, will continue    Macrocytic anemia, thrombocytopenia- Hgb is 10.2 and MCV 107.0 on admission; both indices stable, platelets improved to 113 << 80. - No apparent bleeding, will monitor   DVT prophylaxis: SCds  Code Status: Full Family Communication:  None at bedside Disposition Plan: Home pending improvement in creatinine   Consultants:   None   Procedures: Renal US- 5/29- Unable to visualize left kidney by ultrasound. Large amount of bowel gas noted on the left. The possibility of left renal absence cannot be excluded on this study. In this regard, it may be reasonable to consider  either CT or nuclear medicine renal scan to assess for  left-sided renal tissue. Right kidney appears normal by ultrasound.  Antimicrobials:  None.   Subjective: Endorses not feeling himself, but cannot give me any specific complaints. He denies chest pain shortness of breath abdominal pain, nausea vomiting  or diarrhea.   Objective: Vitals:   10/10/16 0731 10/10/16 1116 10/10/16 1504 10/10/16 1949  BP:   (!) 100/30   Pulse:   93 65  Resp:   18 16  Temp:   97.3 F (36.3 C)   TempSrc:   Oral   SpO2: 97% 92% 97% 91%  Weight:      Height:        Intake/Output Summary (Last 24 hours) at 10/10/16 2104 Last data filed at 10/10/16 2014  Gross per 24 hour  Intake          1401.25 ml  Output             1950 ml  Net          -548.75 ml   Filed Weights   10/09/16 1538 10/09/16 2045 10/10/16 0500  Weight: (!) 195 kg (430 lb) (!) 177.3 kg (390 lb 14.4 oz) (!) 177.4 kg (391 lb)    Examination:  General exam: Appears calm and comfortable, Obese Respiratory system: Reduced breath sounds diffusely likely due to patient's habitus Respiratory effort normal. Cardiovascular system: S1 & S2 heard, RRR. No JVD, murmurs, rubs, gallops or clicks. No pedal edema. Gastrointestinal system: Abdomen is nondistended, soft and nontender. No organomegaly or masses felt. Normal bowel sounds heard. Central nervous system: Alert and oriented. No focal neurological deficits. Extremities: Symmetric 5 x 5 power. Skin: No rashes, lesions or ulcers Psychiatry: Judgement and insight appear normal. Mood & affect appropriate.   Data Reviewed: I have personally reviewed following labs and imaging studies  CBC:  Recent Labs Lab 10/09/16 1620 10/10/16 0530  WBC 5.2 4.5  NEUTROABS 4.0  --   HGB 10.2* 10.5*  HCT 32.2* 32.9*  MCV 107.0* 107.5*  PLT PLATELET CLUMPS NOTED ON SMEAR, COUNT APPEARS ADEQUATE 502*   Basic Metabolic Panel:  Recent Labs Lab 10/09/16 1620 10/10/16 0530  NA 139 142  K 5.0 4.7  CL 93* 96*  CO2 37* 37*  GLUCOSE 171* 145*  BUN 88* 84*  CREATININE 4.39* 3.51*  CALCIUM 8.6* 8.4*  MG  --  2.3  PHOS  --  5.2*   Liver Function Tests:  Recent Labs Lab 10/09/16 1620  AST 21  ALT 37  ALKPHOS 80  BILITOT 0.8  PROT 6.9  ALBUMIN 3.2*   CBG:  Recent Labs Lab 10/09/16 2140  10/10/16 0743 10/10/16 1204 10/10/16 1628  GLUCAP 159* 109* 170* 150*    Recent Results (from the past 240 hour(s))  MRSA PCR Screening     Status: None   Collection Time: 10/09/16 10:43 PM  Result Value Ref Range Status   MRSA by PCR NEGATIVE NEGATIVE Final    Comment:        The GeneXpert MRSA Assay (FDA approved for NASAL specimens only), is one component of a comprehensive MRSA colonization surveillance program. It is not intended to diagnose MRSA infection nor to guide or monitor treatment for MRSA infections.      Radiology Studies: US Renal  Result Date: 10/10/2016 CLINICAL DATA:  Chronic renal disease, stage III EXAM: RENAL / URINARY TRACT ULTRASOUND COMPLETE COMPARISON:  None. FINDINGS: Right Kidney: Length: 13.1 cm. Echogenicity and renal cortical thickness  are within normal limits. No mass, perinephric fluid, or hydronephrosis visualized. No sonographically demonstrable calculus or ureterectasis. Left Kidney: Unable to visualize. Bladder: Appears normal for degree of bladder distention. IMPRESSION: Unable to visualize left kidney by ultrasound. Large amount of bowel gas noted on the left. The possibility of left renal absence cannot be excluded on this study. In this regard, it may be reasonable to consider either CT or nuclear medicine renal scan to assess for left-sided renal tissue. Right kidney appears normal by ultrasound. Electronically Signed   By: Lowella Grip III M.D.   On: 10/10/2016 09:58   Dg Chest Portable 1 View  Result Date: 10/09/2016 CLINICAL DATA:  Renal failure. EXAM: PORTABLE CHEST 1 VIEW COMPARISON:  Radiographs of Sep 20, 2016. FINDINGS: Stable cardiomegaly. No pneumothorax is noted. Right lung is clear. Left basilar opacity is noted concerning for atelectasis or infiltrate with associated pleural effusion. Bony thorax is unremarkable. IMPRESSION: Mild left basilar atelectasis or infiltrate is noted. Electronically Signed   By: Marijo Conception,  M.D.   On: 10/09/2016 16:21   Scheduled Meds: . ARIPiprazole  20 mg Oral Daily  . aspirin EC  81 mg Oral Daily  . atenolol  25 mg Oral Daily  . busPIRone  7.5 mg Oral BID  . calcitRIOL  0.25 mcg Oral BID WC  . citalopram  40 mg Oral QHS  . fluticasone furoate-vilanterol  1 puff Inhalation Daily  . gabapentin  300 mg Oral TID  . guaiFENesin  600 mg Oral BID  . insulin aspart  0-15 Units Subcutaneous TID WC  . insulin aspart  0-5 Units Subcutaneous QHS  . insulin aspart  12 Units Subcutaneous TID WC  . insulin glargine  60 Units Subcutaneous BID  . ipratropium-albuterol  3 mL Nebulization TID  . lactulose  13.3 g Oral TID  . levothyroxine  25 mcg Oral QAC breakfast  . nicotine  14 mg Transdermal Daily  . pantoprazole  40 mg Oral Daily  . rifaximin  200 mg Oral Daily  . senna-docusate  1 tablet Oral Daily  . traZODone  150 mg Oral QHS   Continuous Infusions: . sodium chloride 75 mL/hr at 10/10/16 1230     LOS: 1 day   Bethena Roys, MD [facility] Pager [phone]  If 7PM-7AM, please contact night-coverage [email or web address] Password [email or web address] 10/10/2016, 9:04 PM

## 2016-10-10 NOTE — Progress Notes (Signed)
While doing pts admission, pt stated he has an advanced medical directive that states he is a DNR. Pt stated he wanted to change that because "he wants to live." Spiritual care consult placed, pts code status stated Full Code as of now.

## 2016-10-10 NOTE — ACP (Advance Care Planning) (Signed)
Left Advance Directives information with contact information. Mr Branson was asleep each time(2) I visited. Will follow up tomorrow.

## 2016-10-11 DIAGNOSIS — K219 Gastro-esophageal reflux disease without esophagitis: Secondary | ICD-10-CM

## 2016-10-11 DIAGNOSIS — N183 Chronic kidney disease, stage 3 (moderate): Secondary | ICD-10-CM

## 2016-10-11 LAB — BASIC METABOLIC PANEL
Anion gap: 7 (ref 5–15)
BUN: 65 mg/dL — AB (ref 6–20)
CHLORIDE: 100 mmol/L — AB (ref 101–111)
CO2: 36 mmol/L — AB (ref 22–32)
Calcium: 8.2 mg/dL — ABNORMAL LOW (ref 8.9–10.3)
Creatinine, Ser: 2.27 mg/dL — ABNORMAL HIGH (ref 0.61–1.24)
GFR calc Af Amer: 36 mL/min — ABNORMAL LOW (ref >60)
GFR calc non Af Amer: 31 mL/min — ABNORMAL LOW (ref >60)
Glucose, Bld: 157 mg/dL — ABNORMAL HIGH (ref 65–99)
POTASSIUM: 4.7 mmol/L (ref 3.5–5.1)
Sodium: 143 mmol/L (ref 135–145)

## 2016-10-11 LAB — UREA NITROGEN, URINE: Urea Nitrogen, Ur: 279 mg/dL

## 2016-10-11 LAB — GLUCOSE, CAPILLARY
Glucose-Capillary: 153 mg/dL — ABNORMAL HIGH (ref 65–99)
Glucose-Capillary: 155 mg/dL — ABNORMAL HIGH (ref 65–99)

## 2016-10-11 LAB — HEMOGLOBIN A1C
HEMOGLOBIN A1C: 6.1 % — AB (ref 4.8–5.6)
Mean Plasma Glucose: 128 mg/dL

## 2016-10-11 NOTE — Care Management Note (Signed)
Case Management Note  Patient Details  Name: Jorge [name] [facility] MRN: [id number] Date of Birth: 1965/08/06    Expected Discharge Date:      10/11/2016           Expected Discharge Plan:  [place]  In-House Referral:  Clinical Social Work  Discharge planning Services  CM Consult  Post Acute Care Choice:    Choice offered to:     DME Arranged:    DME Agency:     HH Arranged:    [place] Agency:     Status of Service:  Completed, signed off  If discussed at [employer], dates discussed:    Additional Comments: Patient adm from SNF with AKI. Plans to return to SNF at DC, anticipate DC will be today. CSW making arrangements. No CM needs. Bridger Pizzi, Chauncey [place], RN 10/11/2016, 12:08 PM

## 2016-10-11 NOTE — Discharge Summary (Signed)
Physician Discharge Summary  Jorge [name] ZOX:[id number] DOB: October 26, 1965 DOA: 10/09/2016  PCP: Hilbert Corrigan, MD  Admit date: 10/09/2016 Discharge date: 10/11/2016  Admitted From: SNF Disposition:   SNF  Recommendations for Outpatient Follow-up:  1. Follow up with PCP in 1-2 weeks 2. BMP and CBCD within 3 days 3. Please monitor Cr and make adjustments as needed for patients diuretics 4. Set up follow up with Gastroenterology and Nephrology outpatient  Home Health: No  Equipment/Devices: Wheelchair   Discharge Condition: Stable  CODE STATUS: full Code  Diet recommendation: Heart Healthy / Carb Modified  Brief/Interim Summary: Jorge Sagar Helmickis a 51 y.o.malewith medical history significant for insulin-dependent diabetes mellitus, hypertension, chronic kidney disease stage III, COPD, liver cirrhosis, obstructive sleep apnea, and depression with anxiety - presented with abnormal blood work. Patient had been admitted to the hospital from 09/20/2016 until 09/24/2016 for acute on chronic hypercarbic respiratory failure and was found to have an acute kidney injury superimposed on his CKD III, with creatinine in the 4-range, improved to 1.60. In the ED- EKG features a sinus rhythm with first degree AV nodal block and low voltage QRS. Chemistry panel features a bicarbonate of 37, slightly higher than priors, and a serum creatinine of 4.39, up from 1.60 two weeks earlier.  Patient was started on gentle IV hydration and his creatinine improved.  I discussed following up with Gastroenterology and Nephrology outpatient.  Patient is agreeable to this.  On day of discharge patient's creatinine improved to 2.27.  His enalapril was stopped.  He will need BMP in 3 days after discharge.  Of note, on renal ultrasound, patient left kidney could not be visualized.  He denies nephrectomy and discussed case with Dr. Theador Hawthorne who stated patient could be followed outpatient for further evaluation.     Discharge Diagnoses:  Principal Problem:   Acute kidney injury superimposed on CKD ([place]) Active Problems:   COPD (chronic obstructive pulmonary disease) (HCC)   Cirrhosis (HCC)   CKD (chronic kidney disease) stage 3, GFR 30-59 ml/min   Essential hypertension   Chronic pain   GERD without esophagitis   Sleep apnea   Chronic respiratory failure with hypercapnia (HCC)   Macrocytic anemia   Diabetes mellitus ([place])   AKI (acute kidney injury) [facility])    Discharge Instructions  Discharge Instructions    Ambulatory referral to Gastroenterology    Complete by:  As directed    Ambulatory referral to Nephrology    Complete by:  As directed    Call MD for:  difficulty breathing, headache or visual disturbances    Complete by:  As directed    Call MD for:  extreme fatigue    Complete by:  As directed    Call MD for:  hives    Complete by:  As directed    Call MD for:  persistant dizziness or light-headedness    Complete by:  As directed    Call MD for:  persistant nausea and vomiting    Complete by:  As directed    Call MD for:  severe uncontrolled pain    Complete by:  As directed    Call MD for:  temperature >100.4    Complete by:  As directed    Diet - low sodium heart healthy    Complete by:  As directed    Increase activity slowly    Complete by:  As directed      Allergies as of 10/11/2016  Reactions   Penicillins Anaphylaxis   Patient states he is not allergic to anything;  Pt says it's his twin brother that has this allergy.      Medication List    STOP taking these medications   enalapril 2.5 MG tablet Commonly known as:  VASOTEC     TAKE these medications   ARIPiprazole 20 MG tablet Commonly known as:  ABILIFY Take 20 mg by mouth daily.   aspirin 81 MG tablet Take 81 mg by mouth daily.   atenolol 25 MG tablet Commonly known as:  TENORMIN Take 25 mg by mouth daily.   BREO ELLIPTA 100-25 MCG/INH Aepb Generic drug:  fluticasone  furoate-vilanterol Inhale 1 puff into the lungs daily.   busPIRone 7.5 MG tablet Commonly known as:  BUSPAR Take 7.5 mg by mouth 2 (two) times daily. 3 day dose to be completed 09/22/2016   calcitRIOL 0.25 MCG capsule Commonly known as:  ROCALTROL Take 1 capsule (0.25 mcg total) by mouth 2 (two) times daily with a meal.   calcium citrate 950 MG tablet Commonly known as:  CALCITRATE - dosed in mg elemental calcium Take 1 tablet by mouth daily.   cholecalciferol 1000 units tablet Commonly known as:  VITAMIN D Take 1,000 Units by mouth daily. Reported on 05/03/2015   citalopram 40 MG tablet Commonly known as:  CELEXA Take 40 mg by mouth at bedtime.   cyclobenzaprine 10 MG tablet Commonly known as:  FLEXERIL Take 1 tablet (10 mg total) by mouth 3 (three) times daily as needed (muscle soreness).   gabapentin 300 MG capsule Commonly known as:  NEURONTIN Take 300 mg by mouth 3 (three) times daily.   guaiFENesin 600 MG 12 hr tablet Commonly known as:  MUCINEX Take 600 mg by mouth 2 (two) times daily.   ipratropium-albuterol 0.5-2.5 (3) MG/3ML Soln Commonly known as:  DUONEB Take 3 mLs by nebulization 3 (three) times daily. What changed:  additional instructions   lactulose (encephalopathy) 10 GM/15ML Soln Commonly known as:  CHRONULAC Take 14.5 g by mouth 3 (three) times daily. 28mls 3 times daily   LANTUS SOLOSTAR 100 UNIT/ML Solostar Pen Generic drug:  Insulin Glargine Inject 60 Units into the skin 2 (two) times daily.   levothyroxine 25 MCG tablet Commonly known as:  SYNTHROID, LEVOTHROID Take 25 mcg by mouth daily before breakfast.   Melatonin 5 MG Caps Take 1 capsule by mouth at bedtime.   metolazone 2.5 MG tablet Commonly known as:  ZAROXOLYN Take 2.5 mg by mouth every Monday, Wednesday, and Friday.   nicotine 14 mg/24hr patch Commonly known as:  NICODERM CQ - dosed in mg/24 hours Place 14 mg onto the skin daily.   nitroGLYCERIN 0.4 MG SL tablet Commonly  known as:  NITROSTAT Place 0.4 mg under the tongue every 5 (five) minutes as needed for chest pain.   NOVOLOG FLEXPEN 100 UNIT/ML FlexPen Generic drug:  insulin aspart Inject 15-20 Units into the skin 4 (four) times daily -  before meals and at bedtime. 15 units twice daily at 730a and 1130a, then take 20 units at 1700 in addition to sliding scale  150-200=2units Call np/md if less than 70 201-250=4units 251-300=6units 301-350=8units 351-400=10units Greater than 400 call np and md   NU-IRON 150 MG capsule Generic drug:  iron polysaccharides Take 150 mg by mouth daily.   omeprazole 20 MG capsule Commonly known as:  PRILOSEC Take 20 mg by mouth daily.   OXYGEN Inhale 3 L into the lungs daily  as needed (for breathing).   polyethylene glycol packet Commonly known as:  MIRALAX / GLYCOLAX Take 17 g by mouth daily as needed for mild constipation or moderate constipation.   PROAIR HFA 108 (90 Base) MCG/ACT inhaler Generic drug:  albuterol Inhale 2 puffs into the lungs every 6 (six) hours as needed for wheezing or shortness of breath. Shortness of breath/wheeze   rifaximin 200 MG tablet Commonly known as:  XIFAXAN Take 200 mg by mouth daily.   sennosides-docusate sodium 8.6-50 MG tablet Commonly known as:  SENOKOT-S Take 1 tablet by mouth daily.   sitaGLIPtin 25 MG tablet Commonly known as:  JANUVIA Take 25 mg by mouth daily.   torsemide 20 MG tablet Commonly known as:  DEMADEX Take 20-40 mg by mouth See admin instructions. On Saturdays, Mondays, and Wednesdays take 20mg  and 40mg  on Sundays, Tuesdays, and Thursdays   traMADol 50 MG tablet Commonly known as:  ULTRAM Take 1 tablet (50 mg total) by mouth every 6 (six) hours as needed for moderate pain.   traZODone 50 MG tablet Commonly known as:  DESYREL Take 150 mg by mouth at bedtime.   Vitamin D (Ergocalciferol) 50000 units Caps capsule Commonly known as:  DRISDOL Take 50,000 Units by mouth every 30 (thirty) days.       Contact information for after-discharge care    Destination    [facility] Follow up.   Specialty:  Skilled Nursing Facility Contact information: [place] [phone]             Allergies  Allergen Reactions  . Penicillins Anaphylaxis    Patient states he is not allergic to anything;  Pt says it's his twin brother that has this allergy.    Consultations:  None   Procedures/Studies: US Renal  Result Date: 10/10/2016 CLINICAL DATA:  Chronic renal disease, stage III EXAM: RENAL / URINARY TRACT ULTRASOUND COMPLETE COMPARISON:  None. FINDINGS: Right Kidney: Length: 13.1 cm. Echogenicity and renal cortical thickness are within normal limits. No mass, perinephric fluid, or hydronephrosis visualized. No sonographically demonstrable calculus or ureterectasis. Left Kidney: Unable to visualize. Bladder: Appears normal for degree of bladder distention. IMPRESSION: Unable to visualize left kidney by ultrasound. Large amount of bowel gas noted on the left. The possibility of left renal absence cannot be excluded on this study. In this regard, it may be reasonable to consider either CT or nuclear medicine renal scan to assess for left-sided renal tissue. Right kidney appears normal by ultrasound. Electronically Signed   By: Lowella Grip III M.D.   On: 10/10/2016 09:58   Dg Chest Portable 1 View  Result Date: 10/09/2016 CLINICAL DATA:  Renal failure. EXAM: PORTABLE CHEST 1 VIEW COMPARISON:  Radiographs of Sep 20, 2016. FINDINGS: Stable cardiomegaly. No pneumothorax is noted. Right lung is clear. Left basilar opacity is noted concerning for atelectasis or infiltrate with associated pleural effusion. Bony thorax is unremarkable. IMPRESSION: Mild left basilar atelectasis or infiltrate is noted. Electronically Signed   By: Marijo Conception, M.D.   On: 10/09/2016 16:21   Dg Chest Portable 1 View  Result Date: 09/21/2016 CLINICAL DATA:   Chest pain and shortness of breath. History of hypertension, COPD, diabetes, former smoker. EXAM: PORTABLE CHEST 1 VIEW COMPARISON:  06/14/2016 FINDINGS: Examination is somewhat limited due to patient positioning. Shallow inspiration. Cardiac enlargement without vascular congestion. No focal consolidation in the lungs. No blunting of costophrenic angles. No pneumothorax. IMPRESSION: No evidence of active pulmonary disease.  Cardiac enlargement. Electronically Signed   By: Lucienne Capers M.D.   On: 09/21/2016 00:21      Subjective: Patient reports he feels slightly better than normal.  Says he is achey all over from hospital bed.  Denies any nausea or vomiting.  Agreeable to outpatient workup with nephrology and gastroenterology.  Discharge Exam: Vitals:   10/10/16 2204 10/11/16 0510  BP: 104/65 (!) 113/48  Pulse: 69 (!) 58  Resp: 16 16  Temp: 97.7 F (36.5 C) 98.2 F (36.8 C)   Vitals:   10/10/16 1504 10/10/16 1949 10/10/16 2204 10/11/16 0510  BP: (!) 100/30  104/65 (!) 113/48  Pulse: 93 65 69 (!) 58  Resp: 18 16 16 16   Temp: 97.3 F (36.3 C)  97.7 F (36.5 C) 98.2 F (36.8 C)  TempSrc: Oral  Oral Oral  SpO2: 97% 91% 100% 96%  Weight:    (!) 178.6 kg (393 lb 11.9 oz)  Height:        General: Pt is alert, awake, not in acute distress Cardiovascular: RRR, S1/S2 +, no rubs, no gallops Respiratory: CTA bilaterally, no wheezing, no rhonchi Abdominal: Soft, obese, NT, ND, bowel sounds + Extremities: no edema, no cyanosis    The results of significant diagnostics from this hospitalization (including imaging, microbiology, ancillary and laboratory) are listed below for reference.     Microbiology: Recent Results (from the past 240 hour(s))  MRSA PCR Screening     Status: None   Collection Time: 10/09/16 10:43 PM  Result Value Ref Range Status   MRSA by PCR NEGATIVE NEGATIVE Final    Comment:        The GeneXpert MRSA Assay (FDA approved for NASAL specimens only), is one  component of a comprehensive MRSA colonization surveillance program. It is not intended to diagnose MRSA infection nor to guide or monitor treatment for MRSA infections.      Labs: BNP (last 3 results)  Recent Labs  12/13/15 0901 09/20/16 2300  BNP 124.0* 671.2*   Basic Metabolic Panel:  Recent Labs Lab 10/09/16 1620 10/10/16 0530 10/11/16 0639  NA 139 142 143  K 5.0 4.7 4.7  CL 93* 96* 100*  CO2 37* 37* 36*  GLUCOSE 171* 145* 157*  BUN 88* 84* 65*  CREATININE 4.39* 3.51* 2.27*  CALCIUM 8.6* 8.4* 8.2*  MG  --  2.3  --   PHOS  --  5.2*  --    Liver Function Tests:  Recent Labs Lab 10/09/16 1620  AST 21  ALT 37  ALKPHOS 80  BILITOT 0.8  PROT 6.9  ALBUMIN 3.2*   No results for input(s): LIPASE, AMYLASE in the last 168 hours. No results for input(s): AMMONIA in the last 168 hours. CBC:  Recent Labs Lab 10/09/16 1620 10/10/16 0530  WBC 5.2 4.5  NEUTROABS 4.0  --   HGB 10.2* 10.5*  HCT 32.2* 32.9*  MCV 107.0* 107.5*  PLT PLATELET CLUMPS NOTED ON SMEAR, COUNT APPEARS ADEQUATE 113*   Cardiac Enzymes: No results for input(s): CKTOTAL, CKMB, CKMBINDEX, TROPONINI in the last 168 hours. BNP: Invalid input(s): POCBNP CBG:  Recent Labs Lab 10/10/16 1204 10/10/16 1628 10/10/16 2201 10/11/16 0725 10/11/16 1114  GLUCAP 170* 150* 127* 153* 155*   D-Dimer No results for input(s): DDIMER in the last 72 hours. Hgb A1c  Recent Labs  10/09/16 1620  HGBA1C 6.1*   Lipid Profile No results for input(s): CHOL, HDL, LDLCALC, TRIG, CHOLHDL, LDLDIRECT in the last 72 hours. Thyroid function studies  No results for input(s): TSH, T4TOTAL, T3FREE, THYROIDAB in the last 72 hours.  Invalid input(s): FREET3 Anemia work up No results for input(s): VITAMINB12, FOLATE, FERRITIN, TIBC, IRON, RETICCTPCT in the last 72 hours. Urinalysis    Component Value Date/Time   COLORURINE YELLOW 10/09/2016 1930   APPEARANCEUR CLEAR 10/09/2016 1930   APPEARANCEUR Clear  03/25/2013 1630   LABSPEC 1.008 10/09/2016 1930   LABSPEC 1.023 03/25/2013 1630   PHURINE 5.0 10/09/2016 1930   GLUCOSEU NEGATIVE 10/09/2016 1930   GLUCOSEU Negative 03/25/2013 1630   HGBUR NEGATIVE 10/09/2016 1930   BILIRUBINUR NEGATIVE 10/09/2016 1930   BILIRUBINUR Negative 03/25/2013 [place] 10/09/2016 1930   PROTEINUR NEGATIVE 10/09/2016 1930   UROBILINOGEN 4.0 (H) 12/21/2014 1618   NITRITE NEGATIVE 10/09/2016 1930   LEUKOCYTESUR NEGATIVE 10/09/2016 1930   LEUKOCYTESUR Negative 03/25/2013 1630   Sepsis Labs Invalid input(s): PROCALCITONIN,  WBC,  LACTICIDVEN Microbiology Recent Results (from the past 240 hour(s))  MRSA PCR Screening     Status: None   Collection Time: 10/09/16 10:43 PM  Result Value Ref Range Status   MRSA by PCR NEGATIVE NEGATIVE Final    Comment:        The GeneXpert MRSA Assay (FDA approved for NASAL specimens only), is one component of a comprehensive MRSA colonization surveillance program. It is not intended to diagnose MRSA infection nor to guide or monitor treatment for MRSA infections.      Time coordinating discharge: 35 minutes  SIGNED:   Loretha Stapler, MD  [facility] 10/11/2016, 2:14 PM Pager [phone] If 7PM-7AM, please contact night-coverage [email or web address] Password [email or web address]

## 2016-10-11 NOTE — ACP (Advance Care Planning) (Signed)
Discussed Advance Directives with Jorge [name] briefly today. He stated he wanted to review the information before completion. Plan to follow up unless he is discharged before that is possible.

## 2016-10-11 NOTE — Care Management Important Message (Signed)
Important Message  Patient Details  Name: Kaci Freel [facility] MRN: [id number] Date of Birth: [birth year]   Medicare Important Message Given:  Yes    Debara Kamphuis, Chauncey Reading, RN 10/11/2016, 12:07 PM

## 2016-10-11 NOTE — NC FL2 (Signed)
[place] LEVEL OF CARE SCREENING TOOL     IDENTIFICATION  Patient Name: Jorge [name] Birthdate: 20-Oct-1965 Sex: male Admission Date (Current Location): 10/09/2016  [facility] and [place] Number:  [employer] and Address:  [place] [address], [place]      Provider Number: [id number]  Attending Physician Name and Address:  Eber Jones, MD  Relative Name and Phone Number:       Current Level of Care: Hospital Recommended Level of Care: Nursing Facility Prior Approval Number:    Date Approved/Denied:   PASRR Number:    Discharge Plan: SNF    Current Diagnoses: Patient Active Problem List   Diagnosis Date Noted  . AKI (acute kidney injury) ([place]) 10/09/2016  . Acute kidney injury superimposed on CKD ([place]) 10/09/2016  . Respiratory failure, acute and chronic ([place]) 09/21/2016  . Other hyperparathyroidism ([place]) 08/03/2016  . Hypocalcemia 08/03/2016  . DNR (do not resuscitate) 12/13/2015  . Diabetes mellitus ([place]) 12/13/2015  . Pressure ulcer 12/23/2014  . Syncope 12/23/2014  . Macrocytic anemia 12/21/2014  . Thrombocytopenia ([place]) 10/27/2014  . Hyperglycemia 10/27/2014  . COPD with exacerbation ([place]) 10/27/2014  . Chronic respiratory failure with hypercapnia ([place]) 10/27/2014  . Sleep apnea   . Tobacco abuse   . Insomnia 10/03/2014  . Chronic pain 10/03/2014  . GERD without esophagitis 10/03/2014  . Chronic obstructive pulmonary disease with acute exacerbation ([place])   . Renal insufficiency   . Pain in the chest   . Essential hypertension   . COPD (chronic obstructive pulmonary disease) ([place]) 08/29/2014  . Chest pain 08/29/2014  . CKD (chronic kidney disease) stage 3, GFR 30-59 ml/min 08/29/2014  . Acute respiratory failure with hypoxia ([place]) 08/29/2014  . Cirrhosis ([place])   . Hypertension     Orientation RESPIRATION BLADDER Height & Weight     Self, Time, Situation, Place  O2 (2L) Incontinent  Weight: (!) 393 lb 11.9 oz (178.6 kg) Height:  6\' 3"  (190.5 cm)  BEHAVIORAL SYMPTOMS/MOOD NEUROLOGICAL BOWEL NUTRITION STATUS      Incontinent Diet (Diet renal with fluid restriciton.  Low potassium diet)  AMBULATORY STATUS COMMUNICATION OF NEEDS Skin   Extensive Assist Verbally Normal                       Personal Care Assistance Level of Assistance  Bathing, Feeding, Dressing Bathing Assistance: Maximum assistance Feeding assistance: Independent Dressing Assistance: Maximum assistance     Functional Limitations Info  Sight, Hearing, Speech Sight Info: Adequate Hearing Info: Adequate Speech Info: Adequate    SPECIAL CARE FACTORS FREQUENCY                       Contractures Contractures Info: Not present    Additional Factors Info  Code Status, Allergies Code Status Info: Full Code Allergies Info: Penicillins           Current Medications (10/11/2016):  This is the current hospital active medication list Current Facility-Administered Medications  Medication Dose Route Frequency Provider Last Rate Last Dose  . acetaminophen (TYLENOL) tablet 650 mg  650 mg Oral Q6H PRN Opyd, Ilene Qua, MD       Or  . acetaminophen (TYLENOL) suppository 650 mg  650 mg Rectal Q6H PRN Opyd, Ilene Qua, MD      . ARIPiprazole (ABILIFY) tablet 20 mg  20 mg Oral Daily Opyd, Ilene Qua, MD   20 mg at  10/11/16 1036  . aspirin EC tablet 81 mg  81 mg Oral Daily Opyd, Ilene Qua, MD   81 mg at 10/11/16 1037  . atenolol (TENORMIN) tablet 25 mg  25 mg Oral Daily Opyd, Ilene Qua, MD   25 mg at 10/11/16 1037  . busPIRone (BUSPAR) tablet 7.5 mg  7.5 mg Oral BID Opyd, Ilene Qua, MD   7.5 mg at 10/11/16 1035  . calcitRIOL (ROCALTROL) capsule 0.25 mcg  0.25 mcg Oral BID WC Opyd, Ilene Qua, MD   0.25 mcg at 10/11/16 0805  . citalopram (CELEXA) tablet 40 mg  40 mg Oral QHS Opyd, Ilene Qua, MD   40 mg at 10/10/16 2219  . cyclobenzaprine (FLEXERIL) tablet 10 mg  10 mg Oral TID PRN Vianne Bulls, MD    10 mg at 10/10/16 2220  . fluticasone furoate-vilanterol (BREO ELLIPTA) 100-25 MCG/INH 1 puff  1 puff Inhalation Daily Opyd, Ilene Qua, MD   1 puff at 10/11/16 0735  . gabapentin (NEURONTIN) capsule 300 mg  300 mg Oral TID Vianne Bulls, MD   300 mg at 10/11/16 1038  . guaiFENesin (MUCINEX) 12 hr tablet 600 mg  600 mg Oral BID Opyd, Ilene Qua, MD   600 mg at 10/11/16 1036  . insulin aspart (novoLOG) injection 0-15 Units  0-15 Units Subcutaneous TID WC Opyd, Ilene Qua, MD   3 Units at 10/11/16 1145  . insulin aspart (novoLOG) injection 0-5 Units  0-5 Units Subcutaneous QHS Opyd, Timothy S, MD      . insulin aspart (novoLOG) injection 12 Units  12 Units Subcutaneous TID WC Opyd, Ilene Qua, MD   12 Units at 10/11/16 1144  . insulin glargine (LANTUS) injection 60 Units  60 Units Subcutaneous BID Vianne Bulls, MD   60 Units at 10/11/16 1038  . ipratropium-albuterol (DUONEB) 0.5-2.5 (3) MG/3ML nebulizer solution 3 mL  3 mL Nebulization TID Opyd, Ilene Qua, MD   3 mL at 10/11/16 1405  . lactulose (CHRONULAC) 10 GM/15ML solution 13.3 g  13.3 g Oral TID Opyd, Ilene Qua, MD   13.3 g at 10/11/16 1038  . levothyroxine (SYNTHROID, LEVOTHROID) tablet 25 mcg  25 mcg Oral QAC breakfast Opyd, Ilene Qua, MD   25 mcg at 10/11/16 0806  . nicotine (NICODERM CQ - dosed in mg/24 hours) patch 14 mg  14 mg Transdermal Daily Opyd, Ilene Qua, MD   14 mg at 10/11/16 1038  . ondansetron (ZOFRAN) tablet 4 mg  4 mg Oral Q6H PRN Opyd, Ilene Qua, MD       Or  . ondansetron (ZOFRAN) injection 4 mg  4 mg Intravenous Q6H PRN Opyd, Ilene Qua, MD      . pantoprazole (PROTONIX) EC tablet 40 mg  40 mg Oral Daily Opyd, Ilene Qua, MD   40 mg at 10/11/16 1035  . polyethylene glycol (MIRALAX / GLYCOLAX) packet 17 g  17 g Oral Daily PRN Opyd, Ilene Qua, MD      . rifaximin Doreene Nest) tablet 200 mg  200 mg Oral Daily Opyd, Ilene Qua, MD   200 mg at 10/11/16 1038  . senna-docusate (Senokot-S) tablet 1 tablet  1 tablet Oral Daily Opyd,  Ilene Qua, MD   1 tablet at 10/11/16 1038  . traMADol (ULTRAM) tablet 50 mg  50 mg Oral Q6H PRN Opyd, Ilene Qua, MD   50 mg at 10/11/16 1046  . traZODone (DESYREL) tablet 150 mg  150 mg Oral QHS Opyd, Ilene Qua, MD  150 mg at 10/10/16 2219     Discharge Medications: Please see discharge summary for a list of discharge medications.  Relevant Imaging Results:  Relevant Lab Results:   Additional Information SSN: [id number]  Ihor Gully, LCSW

## 2016-10-11 NOTE — Clinical Social Work Note (Signed)
Clinical Social Work Assessment  Patient Details  Name: Jorge [name] [facility] MRN: [id number] Date of Birth: 05/20/1965  Date of referral:  10/11/16               Reason for consult:  Discharge Planning                Permission sought to share information with:    Permission granted to share information::     Name::        Agency::  [facility] left for [facility]  Relationship::     Contact Information:     Housing/Transportation Living arrangements for the past 2 months:  [place] of Information:  Patient Patient Interpreter Needed:  None Criminal Activity/Legal Involvement Pertinent to Current Situation/Hospitalization:  No - Comment as needed Significant Relationships:  Other Family Members Lives with:  Facility Resident Do you feel safe going back to the place where you live?  Yes Need for family participation in patient care:  No (Coment)  Care giving concerns:  None identified.    Social Worker assessment / plan:  Patient has been a resident at [facility] for 1.5 years.  Patient stated that he has minimal family support.  Patient uses a wheelchair and ADLs are completed by staff. Patient plans on returning to [facility] at discharge.   Employment status:  Disabled (Comment on whether or not currently receiving Disability) Insurance information:  Programmer, applications, [place] PT Recommendations:  Not assessed at this time Information / Referral to community resources:     Patient/Family's Response to care:  Patient agreeable to return to SNF.   Patient/Family's Understanding of and Emotional Response to Diagnosis, Current Treatment, and Prognosis:  Patient understands his diagnosis, treatment and prognosis.   Emotional Assessment Appearance:    Attitude/Demeanor/Rapport:   (Cooperative) Affect (typically observed):  Accepting Orientation:  Oriented to Self, Oriented to Place, Oriented to  Time, Oriented to Situation Alcohol / Substance use:  Not  Applicable Psych involvement (Current and /or in the community):  No (Comment)  Discharge Needs  Concerns to be addressed:  Discharge Planning Concerns Readmission within the last 30 days:  No Current discharge risk:  None Barriers to Discharge:  No Barriers Identified   Ihor Gully, LCSW 10/11/2016, 2:36 PM

## 2016-10-11 NOTE — Progress Notes (Signed)
Patient waiting ion EMS to transport. Called report to [facility] nurse.

## 2016-10-11 NOTE — Clinical Social Work Note (Signed)
LCSW advised Debbie at [facility] and [place], brother of patient's discharge. LCSW arranged transportation via [facility].  LCSW signing off.      Jerman Tinnon, Clydene Pugh, LCSW

## 2016-10-11 NOTE — Progress Notes (Signed)
Patient IV removed and site intact.  

## 2016-11-22 ENCOUNTER — Ambulatory Visit (INDEPENDENT_AMBULATORY_CARE_PROVIDER_SITE_OTHER): Payer: Medicare Other | Admitting: "Endocrinology

## 2016-11-22 ENCOUNTER — Encounter: Payer: Self-pay | Admitting: "Endocrinology

## 2016-11-22 DIAGNOSIS — E212 Other hyperparathyroidism: Secondary | ICD-10-CM

## 2016-11-22 MED ORDER — CALCITRIOL 0.5 MCG PO CAPS: 0.5000 ug | ORAL_CAPSULE | Freq: Two times a day (BID) | ORAL | 2 refills | Status: DC

## 2016-11-22 NOTE — Progress Notes (Signed)
Subjective:    Patient ID: Jorge [name] [facility], male    DOB: 01-09-1966, PCP Hilbert Corrigan, MD   Past Medical History:  Diagnosis Date  . Cirrhosis ([place]) sleep apnea  . COPD (chronic obstructive pulmonary disease) ([place])   . Diabetes mellitus without complication ([place])   . Hypertension   . Renal disorder    stage II  . Renal insufficiency   . Sleep apnea   . Suicide attempt ([place])   . Syncope   . Tobacco abuse    Past Surgical History:  Procedure Laterality Date  . CHOLECYSTECTOMY    . LEG SURGERY Left    Social History   Social History  . Marital status: Single    Spouse name: N/A  . Number of children: N/A  . Years of education: N/A   Social History Main Topics  . Smoking status: Former Smoker    Packs/day: 0.50    Types: Cigarettes    Quit date: 11/09/2015  . Smokeless tobacco: Never Used     Comment: slowing down  . Alcohol use No  . Drug use: No  . Sexual activity: Not Currently   Other Topics Concern  . None   Social History Narrative  . None   Outpatient Encounter Prescriptions as of 11/22/2016  Medication Sig  . ARIPiprazole (ABILIFY) 20 MG tablet Take 20 mg by mouth daily.  Marland Kitchen aspirin 81 MG tablet Take 81 mg by mouth daily.  Marland Kitchen atenolol (TENORMIN) 25 MG tablet Take 25 mg by mouth daily.   . busPIRone (BUSPAR) 7.5 MG tablet Take 7.5 mg by mouth 2 (two) times daily. 3 day dose to be completed 09/22/2016  . calcitRIOL (ROCALTROL) 0.5 MCG capsule Take 1 capsule (0.5 mcg total) by mouth 2 (two) times daily with a meal.  . calcium citrate (CALCITRATE - DOSED IN MG ELEMENTAL CALCIUM) 950 MG tablet Take 1 tablet by mouth 3 (three) times daily with meals.  . cholecalciferol (VITAMIN D) 1000 UNITS tablet Take 1,000 Units by mouth daily. Reported on 05/03/2015  . citalopram (CELEXA) 40 MG tablet Take 40 mg by mouth at bedtime.   . cyclobenzaprine (FLEXERIL) 10 MG tablet Take 1 tablet (10 mg total) by mouth 3 (three) times daily as needed (muscle  soreness).  . fluticasone furoate-vilanterol (BREO ELLIPTA) 100-25 MCG/INH AEPB Inhale 1 puff into the lungs daily.  Marland Kitchen gabapentin (NEURONTIN) 300 MG capsule Take 300 mg by mouth 3 (three) times daily.  Marland Kitchen guaiFENesin (MUCINEX) 600 MG 12 hr tablet Take 600 mg by mouth 2 (two) times daily.   . Insulin Glargine (LANTUS SOLOSTAR) 100 UNIT/ML Solostar Pen Inject 60 Units into the skin 2 (two) times daily.   Marland Kitchen ipratropium-albuterol (DUONEB) 0.5-2.5 (3) MG/3ML SOLN Take 3 mLs by nebulization 3 (three) times daily. (Patient taking differently: Take 3 mLs by nebulization 3 (three) times daily. *May also take every 4 hours as needed for shortness of breath)  . levothyroxine (SYNTHROID, LEVOTHROID) 25 MCG tablet Take 25 mcg by mouth daily before breakfast.  . Melatonin 5 MG CAPS Take 1 capsule by mouth at bedtime.  . nitroGLYCERIN (NITROSTAT) 0.4 MG SL tablet Place 0.4 mg under the tongue every 5 (five) minutes as needed for chest pain.  Marland Kitchen omeprazole (PRILOSEC) 20 MG capsule Take 20 mg by mouth daily.  . OXYGEN Inhale 3 L into the lungs daily as needed (for breathing).   . polyethylene glycol (MIRALAX / GLYCOLAX) packet Take 17 g by mouth daily as needed  for mild constipation or moderate constipation.   . rifaximin (XIFAXAN) 200 MG tablet Take 200 mg by mouth daily.  . sennosides-docusate sodium (SENOKOT-S) 8.6-50 MG tablet Take 1 tablet by mouth daily.   . sitaGLIPtin (JANUVIA) 25 MG tablet Take 25 mg by mouth daily.  . traMADol (ULTRAM) 50 MG tablet Take 1 tablet (50 mg total) by mouth every 6 (six) hours as needed for moderate pain.  . traZODone (DESYREL) 50 MG tablet Take 150 mg by mouth at bedtime.  . [DISCONTINUED] calcitRIOL (ROCALTROL) 0.25 MCG capsule Take 1 capsule (0.25 mcg total) by mouth 2 (two) times daily with a meal.  . insulin aspart (NOVOLOG FLEXPEN) 100 UNIT/ML FlexPen Inject 15-20 Units into the skin 4 (four) times daily -  before meals and at bedtime. 15 units twice daily at 730a and  1130a, then take 20 units at 1700 in addition to sliding scale  150-200=2units Call np/md if less than 70 201-250=4units 251-300=6units 301-350=8units 351-400=10units Greater than 400 call np and md  . iron polysaccharides (NU-IRON) 150 MG capsule Take 150 mg by mouth daily.  Marland Kitchen lactulose, encephalopathy, (CHRONULAC) 10 GM/15ML SOLN Take 14.5 g by mouth 3 (three) times daily. 53mls 3 times daily  . metolazone (ZAROXOLYN) 2.5 MG tablet Take 2.5 mg by mouth every Monday, Wednesday, and Friday.  . nicotine (NICODERM CQ - DOSED IN MG/24 HOURS) 14 mg/24hr patch Place 14 mg onto the skin daily.  Marland Kitchen PROAIR HFA 108 (90 BASE) MCG/ACT inhaler Inhale 2 puffs into the lungs every 6 (six) hours as needed for wheezing or shortness of breath. Shortness of breath/wheeze  . torsemide (DEMADEX) 20 MG tablet Take 20-40 mg by mouth See admin instructions. On Saturdays, Mondays, and Wednesdays take 20mg  and 40mg  on Sundays, Tuesdays, and Thursdays  . Vitamin D, Ergocalciferol, (DRISDOL) 50000 units CAPS capsule Take 50,000 Units by mouth every 30 (thirty) days.   No facility-administered encounter medications on file as of 11/22/2016.    ALLERGIES: Allergies  Allergen Reactions  . Penicillins Anaphylaxis    Patient states he is not allergic to anything;  Pt says it's his twin brother that has this allergy.    VACCINATION STATUS: Immunization History  Administered Date(s) Administered  . Influenza-Unspecified 02/13/2015  . Pneumococcal Polysaccharide-23 08/31/2014    HPI  51 year old gentleman with medical history as above. He is being seen in f/u  of hypocalcemia . - He was seen in March 2018 with labs showing  elevated PTH associated with hypocalcemia of 8.3.  - Low-dose calcitriol was added and he was also kept on calcium supplements with a  diagnosis of hypocalcemia other result of chronic kidney failure.  - He returns with new labs showing calcium low at 7.7. But he did not complete his other labs  requested including PTH, magnesium, phosphorus.  -His medical history is long and complicated. He is a nursing home, wheelchair-bound due to deconditioning from prior falls. - He is known to have chronic renal insufficiency requiring nephrology care when he was in [place] area. He was moved to  [place] 5 years ago, since when he did not see a nephrologist. - He is a poor historian,  accompanied  an aid to the exam room, but she does not know his history. - Review of his medical records shows on 06/04/2016 she was found to have PTH elevated at 95.14, calcium 8.3, GFR 53, BUN elevated at 39, creatinine elevated at 1.48. His 25-hydroxy vitamin D level was normal at 41. - Review  of his medications show that recently cholecalciferol 1000 units daily was added. - He denies any prior history of parathyroid, thyroid, pituitary, nor adrenal dysfunction. - He has been overweight/obese most of his adult life, is an active smoker. He uses oxygen per nasal cannula, related to his history of advanced COPD.  Review of Systems  Constitutional:  Wheelchair-bound, + Progressive weight gain,  + fatigue, no subjective hyperthermia, no subjective hypothermia Eyes: no blurry vision, no xerophthalmia ENT: no sore throat, no nodules palpated in throat, no dysphagia/odynophagia, no hoarseness Cardiovascular: no Chest Pain, + Shortness of Breath, no palpitations, + leg swelling Respiratory: + cough, + SOB Gastrointestinal: no Nausea/Vomiting/Diarhhea Musculoskeletal: no muscle/joint aches Skin: no rashes Neurological: + tremors, no numbness, no tingling, no dizziness Psychiatric: + depression, no anxiety  Objective:    BP 124/74   Pulse 80   Ht 6\' 3"  (1.905 m)   Wt Readings from Last 3 Encounters:  10/11/16 (!) 393 lb 11.9 oz (178.6 kg)  09/23/16 (!) 436 lb 11.7 oz (198.1 kg)  06/15/16 (!) 385 lb (174.6 kg)    Physical Exam  Constitutional: On a wheelchair using oxygen per nasal cannula, significantly  over weight for hight, not in acute distress, normal state of mind, disheveled with poor hygiene. Eyes: PERRLA, EOMI, no exophthalmos ENT: moist mucous membranes, thick neck difficult to examine ,  No gross thyromegaly, no cervical lymphadenopathy Cardiovascular: Distant heart sounds, no Murmur/Rubs/Gallops Respiratory:  inadequate breathing efforts, no gross chest deformity, + bilateral rales. Gastrointestinal: abdomen soft, + obese, Non -tender, + distension, Bowel Sounds present Musculoskeletal: Nonpitting edema on bilateral lower extremities,+ tight skin, suspicious for lymphedema. Skin: + Dry skin, no active rashes Neurological: + Gross tremor at rest, depressed Deep tendon reflexes in  bilateral extremities.  CMP Latest Ref Rng & Units 10/11/2016 10/10/2016 10/09/2016  Glucose 65 - 99 mg/dL 157(H) 145(H) 171(H)  BUN 6 - 20 mg/dL 65(H) 84(H) 88(H)  Creatinine 0.61 - 1.24 mg/dL 2.27(H) 3.51(H) 4.39(H)  Sodium 135 - 145 mmol/L 143 142 139  Potassium 3.5 - 5.1 mmol/L 4.7 4.7 5.0  Chloride 101 - 111 mmol/L 100(L) 96(L) 93(L)  CO2 22 - 32 mmol/L 36(H) 37(H) 37(H)  Calcium 8.9 - 10.3 mg/dL 8.2(L) 8.4(L) 8.6(L)  Total Protein 6.5 - 8.1 g/dL - - 6.9  Total Bilirubin 0.3 - 1.2 mg/dL - - 0.8  Alkaline Phos 38 - 126 U/L - - 80  AST 15 - 41 U/L - - 21  ALT 17 - 63 U/L - - 37      Diabetic Labs (most recent): Lab Results  Component Value Date   HGBA1C 6.1 (H) 10/09/2016   HGBA1C 5.0 03/26/2013     Lipid Panel ( most recent) Lipid Panel     Component Value Date/Time   CHOL 112 12/23/2011 0720   TRIG 85 12/23/2011 0720   HDL 33 (L) 12/23/2011 0720   VLDL 17 12/23/2011 0720   LDLCALC 62 12/23/2011 0720     Labs from 06/04/2016 showed calcium low at 8.3, PTH high at 95.14 (normal 15-65), GFR low at 53. - Recent labs from 11/16/2016 showed BUN 14.1, creatinine 1.34, calcium 7.7, estimated GFR 60.14.     Assessment & Plan:   1. Secondary hyperparathyroidism, due to CKD  - Review  of his labs suggest that he has secondary hyperparathyroidism due to renal insufficiency, likely triggered by mild hypocalcemia of 7.7- 8.3. See below. He will benefit from a higher dose of calcitriol.  - I will  proceed to increase his calcitriol 0.5 g by mouth twice a day with meals. - I advised to continue cholecalciferol 1000 units daily, his most recent vitamin D 25 level was 41-acceptable.  - It is unlikely that he has primary hyperparathyroidism. -He will also benefit from nephrology evaluation, notes that he did have nephrology care when he was in a different city.  - PTH, intact and calcium, - Magnesium,- Phosphorus and thyroid function tests are still needed and he will be sent to lab today.  - He is a poor surgical candidate even if he has primary hyperparathyroidism.  2. Hypocalcemia -His calcium level is lower today at 7.7  (  was 8.3)  consistent with hypocalcemia likely related to renal insufficiency. - I'll proceed to increase his calcium citrate 950 mg by mouth 3 times a day with meals.  -Addition of 1, 25 hydroxy vitamin D in the form of calcitriol will help with hypocalcemia as well.  - I advised patient to maintain close follow up with Hilbert Corrigan, MD for primary care needs. Follow up plan: Return in about 2 weeks (around 12/06/2016) for follow up with pre-visit labs, labs today.  Glade Lloyd, MD Phone: [phone]  Fax: [phone]   11/22/2016, 9:07 AM

## 2016-12-06 ENCOUNTER — Ambulatory Visit: Payer: Medicare Other | Admitting: "Endocrinology

## 2016-12-07 ENCOUNTER — Ambulatory Visit (INDEPENDENT_AMBULATORY_CARE_PROVIDER_SITE_OTHER): Payer: Medicare Other | Admitting: "Endocrinology

## 2016-12-07 ENCOUNTER — Encounter: Payer: Self-pay | Admitting: "Endocrinology

## 2016-12-07 DIAGNOSIS — E212 Other hyperparathyroidism: Secondary | ICD-10-CM

## 2016-12-07 DIAGNOSIS — E039 Hypothyroidism, unspecified: Secondary | ICD-10-CM

## 2016-12-07 NOTE — Progress Notes (Signed)
Subjective:    Patient ID: Jorge [name] [facility], male    DOB: 08-02-1965, PCP Hilbert Corrigan, MD   Past Medical History:  Diagnosis Date  . Cirrhosis ([place]) sleep apnea  . COPD (chronic obstructive pulmonary disease) ([place])   . Diabetes mellitus without complication ([place])   . Hypertension   . Renal disorder    stage II  . Renal insufficiency   . Sleep apnea   . Suicide attempt ([place])   . Syncope   . Tobacco abuse    Past Surgical History:  Procedure Laterality Date  . CHOLECYSTECTOMY    . LEG SURGERY Left    Social History   Social History  . Marital status: Single    Spouse name: N/A  . Number of children: N/A  . Years of education: N/A   Social History Main Topics  . Smoking status: Former Smoker    Packs/day: 0.50    Types: Cigarettes    Quit date: 11/09/2015  . Smokeless tobacco: Never Used     Comment: slowing down  . Alcohol use No  . Drug use: No  . Sexual activity: Not Currently   Other Topics Concern  . Not on file   Social History Narrative  . No narrative on file   Outpatient Encounter Prescriptions as of 12/07/2016  Medication Sig  . ARIPiprazole (ABILIFY) 20 MG tablet Take 20 mg by mouth daily.  Marland Kitchen aspirin 81 MG tablet Take 81 mg by mouth daily.  Marland Kitchen atenolol (TENORMIN) 25 MG tablet Take 25 mg by mouth daily.   . busPIRone (BUSPAR) 7.5 MG tablet Take 7.5 mg by mouth 2 (two) times daily. 3 day dose to be completed 09/22/2016  . calcitRIOL (ROCALTROL) 0.5 MCG capsule Take 1 capsule (0.5 mcg total) by mouth 2 (two) times daily with a meal.  . calcium citrate (CALCITRATE - DOSED IN MG ELEMENTAL CALCIUM) 950 MG tablet Take 1 tablet by mouth 3 (three) times daily with meals.  . cholecalciferol (VITAMIN D) 1000 UNITS tablet Take 1,000 Units by mouth daily. Reported on 05/03/2015  . citalopram (CELEXA) 40 MG tablet Take 40 mg by mouth at bedtime.   . cyclobenzaprine (FLEXERIL) 10 MG tablet Take 1 tablet (10 mg total) by mouth 3 (three) times daily as  needed (muscle soreness).  . fluticasone furoate-vilanterol (BREO ELLIPTA) 100-25 MCG/INH AEPB Inhale 1 puff into the lungs daily.  Marland Kitchen gabapentin (NEURONTIN) 300 MG capsule Take 300 mg by mouth 3 (three) times daily.  Marland Kitchen guaiFENesin (MUCINEX) 600 MG 12 hr tablet Take 600 mg by mouth 2 (two) times daily.   . insulin aspart (NOVOLOG FLEXPEN) 100 UNIT/ML FlexPen Inject 15-20 Units into the skin 4 (four) times daily -  before meals and at bedtime. 15 units twice daily at 730a and 1130a, then take 20 units at 1700 in addition to sliding scale  150-200=2units Call np/md if less than 70 201-250=4units 251-300=6units 301-350=8units 351-400=10units Greater than 400 call np and md  . Insulin Glargine (LANTUS SOLOSTAR) 100 UNIT/ML Solostar Pen Inject 60 Units into the skin 2 (two) times daily.   Marland Kitchen ipratropium-albuterol (DUONEB) 0.5-2.5 (3) MG/3ML SOLN Take 3 mLs by nebulization 3 (three) times daily. (Patient taking differently: Take 3 mLs by nebulization 3 (three) times daily. *May also take every 4 hours as needed for shortness of breath)  . iron polysaccharides (NU-IRON) 150 MG capsule Take 150 mg by mouth daily.  Marland Kitchen lactulose, encephalopathy, (CHRONULAC) 10 GM/15ML SOLN Take 14.5 g by mouth  3 (three) times daily. 23mls 3 times daily  . levothyroxine (SYNTHROID, LEVOTHROID) 25 MCG tablet Take 25 mcg by mouth daily before breakfast.  . Melatonin 5 MG CAPS Take 1 capsule by mouth at bedtime.  . metolazone (ZAROXOLYN) 2.5 MG tablet Take 2.5 mg by mouth every Monday, Wednesday, and Friday.  . nicotine (NICODERM CQ - DOSED IN MG/24 HOURS) 14 mg/24hr patch Place 14 mg onto the skin daily.  . nitroGLYCERIN (NITROSTAT) 0.4 MG SL tablet Place 0.4 mg under the tongue every 5 (five) minutes as needed for chest pain.  Marland Kitchen omeprazole (PRILOSEC) 20 MG capsule Take 20 mg by mouth daily.  . OXYGEN Inhale 3 L into the lungs daily as needed (for breathing).   . polyethylene glycol (MIRALAX / GLYCOLAX) packet Take 17 g by  mouth daily as needed for mild constipation or moderate constipation.   Marland Kitchen PROAIR HFA 108 (90 BASE) MCG/ACT inhaler Inhale 2 puffs into the lungs every 6 (six) hours as needed for wheezing or shortness of breath. Shortness of breath/wheeze  . rifaximin (XIFAXAN) 200 MG tablet Take 200 mg by mouth daily.  . sennosides-docusate sodium (SENOKOT-S) 8.6-50 MG tablet Take 1 tablet by mouth daily.   . sitaGLIPtin (JANUVIA) 25 MG tablet Take 25 mg by mouth daily.  Marland Kitchen torsemide (DEMADEX) 20 MG tablet Take 20-40 mg by mouth See admin instructions. On Saturdays, Mondays, and Wednesdays take 20mg  and 40mg  on Sundays, Tuesdays, and Thursdays  . traMADol (ULTRAM) 50 MG tablet Take 1 tablet (50 mg total) by mouth every 6 (six) hours as needed for moderate pain.  . traZODone (DESYREL) 50 MG tablet Take 150 mg by mouth at bedtime.  . Vitamin D, Ergocalciferol, (DRISDOL) 50000 units CAPS capsule Take 50,000 Units by mouth every 30 (thirty) days.   No facility-administered encounter medications on file as of 12/07/2016.    ALLERGIES: Allergies  Allergen Reactions  . Penicillins Anaphylaxis    Patient states he is not allergic to anything;  Pt says it's his twin brother that has this allergy.    VACCINATION STATUS: Immunization History  Administered Date(s) Administered  . Influenza-Unspecified 02/13/2015  . Pneumococcal Polysaccharide-23 08/31/2014    HPI  51 year old gentleman with medical history as above. He is being seen in f/u  of hypocalcemia . - He was seen in March 2018 with labs showing  elevated PTH associated with hypocalcemia of 8.3.   - He returns with new labs showing calcium 8.6 improving from 7.7.  -His medical history is long and complicated. He is a nursing home, wheelchair-bound due to deconditioning from prior falls. - He is known to have chronic renal insufficiency requiring nephrology care when he was in [place] area. He was moved to  [place] 5 years ago, since when he did not  see a nephrologist. - He is a poor historian,  accompanied  an aid to the exam room, but she does not know his history. - Review of his medical records shows on 06/04/2016 she was found to have PTH elevated at 95.14, calcium 8.3, GFR 53, BUN elevated at 39, creatinine elevated at 1.48. His 25-hydroxy vitamin D level was normal at 41.  - He denies any prior history of parathyroid, thyroid, pituitary, nor adrenal dysfunction. - He has been overweight/obese most of his adult life, is an active smoker. He uses oxygen per nasal cannula, related to his history of advanced COPD.  Review of Systems  Constitutional:  Wheelchair-bound, + Progressive weight gain,  + fatigue, no  subjective hyperthermia, no subjective hypothermia Eyes: no blurry vision, no xerophthalmia ENT: no sore throat, no nodules palpated in throat, no dysphagia/odynophagia, no hoarseness Cardiovascular: no Chest Pain, + Shortness of Breath, no palpitations, + leg swelling Respiratory: + cough, + SOB Gastrointestinal: no Nausea/Vomiting/Diarhhea Musculoskeletal: no muscle/joint aches Skin: no rashes Neurological: + tremors, no numbness, no tingling, no dizziness Psychiatric: + depression, no anxiety  Objective:    BP 120/68   Pulse 71   Ht 6\' 3"  (1.905 m)   Wt Readings from Last 3 Encounters:  10/11/16 (!) 393 lb 11.9 oz (178.6 kg)  09/23/16 (!) 436 lb 11.7 oz (198.1 kg)  06/15/16 (!) 385 lb (174.6 kg)    Physical Exam  Constitutional: On a wheelchair using oxygen per nasal cannula, significantly over weight for hight, not in acute distress, normal state of mind, disheveled with poor hygiene. Eyes: PERRLA, EOMI, no exophthalmos ENT: moist mucous membranes, thick neck difficult to examine ,  No gross thyromegaly, no cervical lymphadenopathy Cardiovascular: Distant heart sounds, no Murmur/Rubs/Gallops Respiratory:  inadequate breathing efforts, no gross chest deformity, + bilateral rales. Gastrointestinal: abdomen soft, +  obese, Non -tender, + distension, Bowel Sounds present Musculoskeletal: Nonpitting edema on bilateral lower extremities,+ tight skin, suspicious for lymphedema. Skin: + Dry skin, no active rashes Neurological: + Gross tremor at rest, depressed Deep tendon reflexes in  bilateral extremities.  CMP Latest Ref Rng & Units 10/11/2016 10/10/2016 10/09/2016  Glucose 65 - 99 mg/dL 157(H) 145(H) 171(H)  BUN 6 - 20 mg/dL 65(H) 84(H) 88(H)  Creatinine 0.61 - 1.24 mg/dL 2.27(H) 3.51(H) 4.39(H)  Sodium 135 - 145 mmol/L 143 142 139  Potassium 3.5 - 5.1 mmol/L 4.7 4.7 5.0  Chloride 101 - 111 mmol/L 100(L) 96(L) 93(L)  CO2 22 - 32 mmol/L 36(H) 37(H) 37(H)  Calcium 8.9 - 10.3 mg/dL 8.2(L) 8.4(L) 8.6(L)  Total Protein 6.5 - 8.1 g/dL - - 6.9  Total Bilirubin 0.3 - 1.2 mg/dL - - 0.8  Alkaline Phos 38 - 126 U/L - - 80  AST 15 - 41 U/L - - 21  ALT 17 - 63 U/L - - 37      Diabetic Labs (most recent): Lab Results  Component Value Date   HGBA1C 6.1 (H) 10/09/2016   HGBA1C 5.0 03/26/2013     Lipid Panel ( most recent) Lipid Panel     Component Value Date/Time   CHOL 112 12/23/2011 0720   TRIG 85 12/23/2011 0720   HDL 33 (L) 12/23/2011 0720   VLDL 17 12/23/2011 0720   LDLCALC 62 12/23/2011 0720     Labs from 06/04/2016 showed calcium low at 8.3, PTH high at 95.14 (normal 15-65), GFR low at 53. - Recent labs from 11/16/2016 showed BUN 14.1, creatinine 1.34, calcium 7.7, estimated GFR 60.14.     Assessment & Plan:   1. Secondary hyperparathyroidism, due to CKD  - Review of his labs suggest that he has secondary hyperparathyroidism due to renal insufficiency, likely triggered by mild hypocalcemia of 7.7- 8.3. See below.  - He has benefited from higher dose of calcitriol.,  his calcium now is 8.6 mg/dL. - I will continue on  calcitriol 0.5 g by mouth twice a day with meals. - I advised to continue cholecalciferol 1000 units daily, his most recent vitamin D 25 level was 41-acceptable.  - It is  unlikely that he has primary hyperparathyroidism. -He will also benefit from nephrology evaluation, notes that he did have nephrology care when he was in a different  city. -  he will still need PTH/calcium before his next visit. - He is a poor surgical candidate even if he has primary hyperparathyroidism.  2. Hypocalcemia -His calcium level is improved to 8.6 mg/dL from 7.7, consistent with hypocalcemia likely related to renal insufficiency. - I'll continue  calcium citrate 950 mg by mouth 3 times a day with meals.  -Addition of 1, 25 hydroxy vitamin D in the form of calcitriol will help with hypocalcemia as well.  - I advised patient to maintain close follow up with Hilbert Corrigan, MD for primary care needs. Follow up plan: Return in about 6 months (around 06/09/2017) for follow up with pre-visit labs.  Glade Lloyd, MD Phone: [phone]  Fax: [phone]   12/07/2016, 2:05 PM

## 2017-06-03 IMAGING — CR DG CHEST 1V
1 series · 1 of 1 positions shown · non-contrast
Comparison: December 21, 2014

CLINICAL DATA: Shortness of breath and cough for 2 weeks

EXAM:
CHEST 1 VIEW

[ap portable]
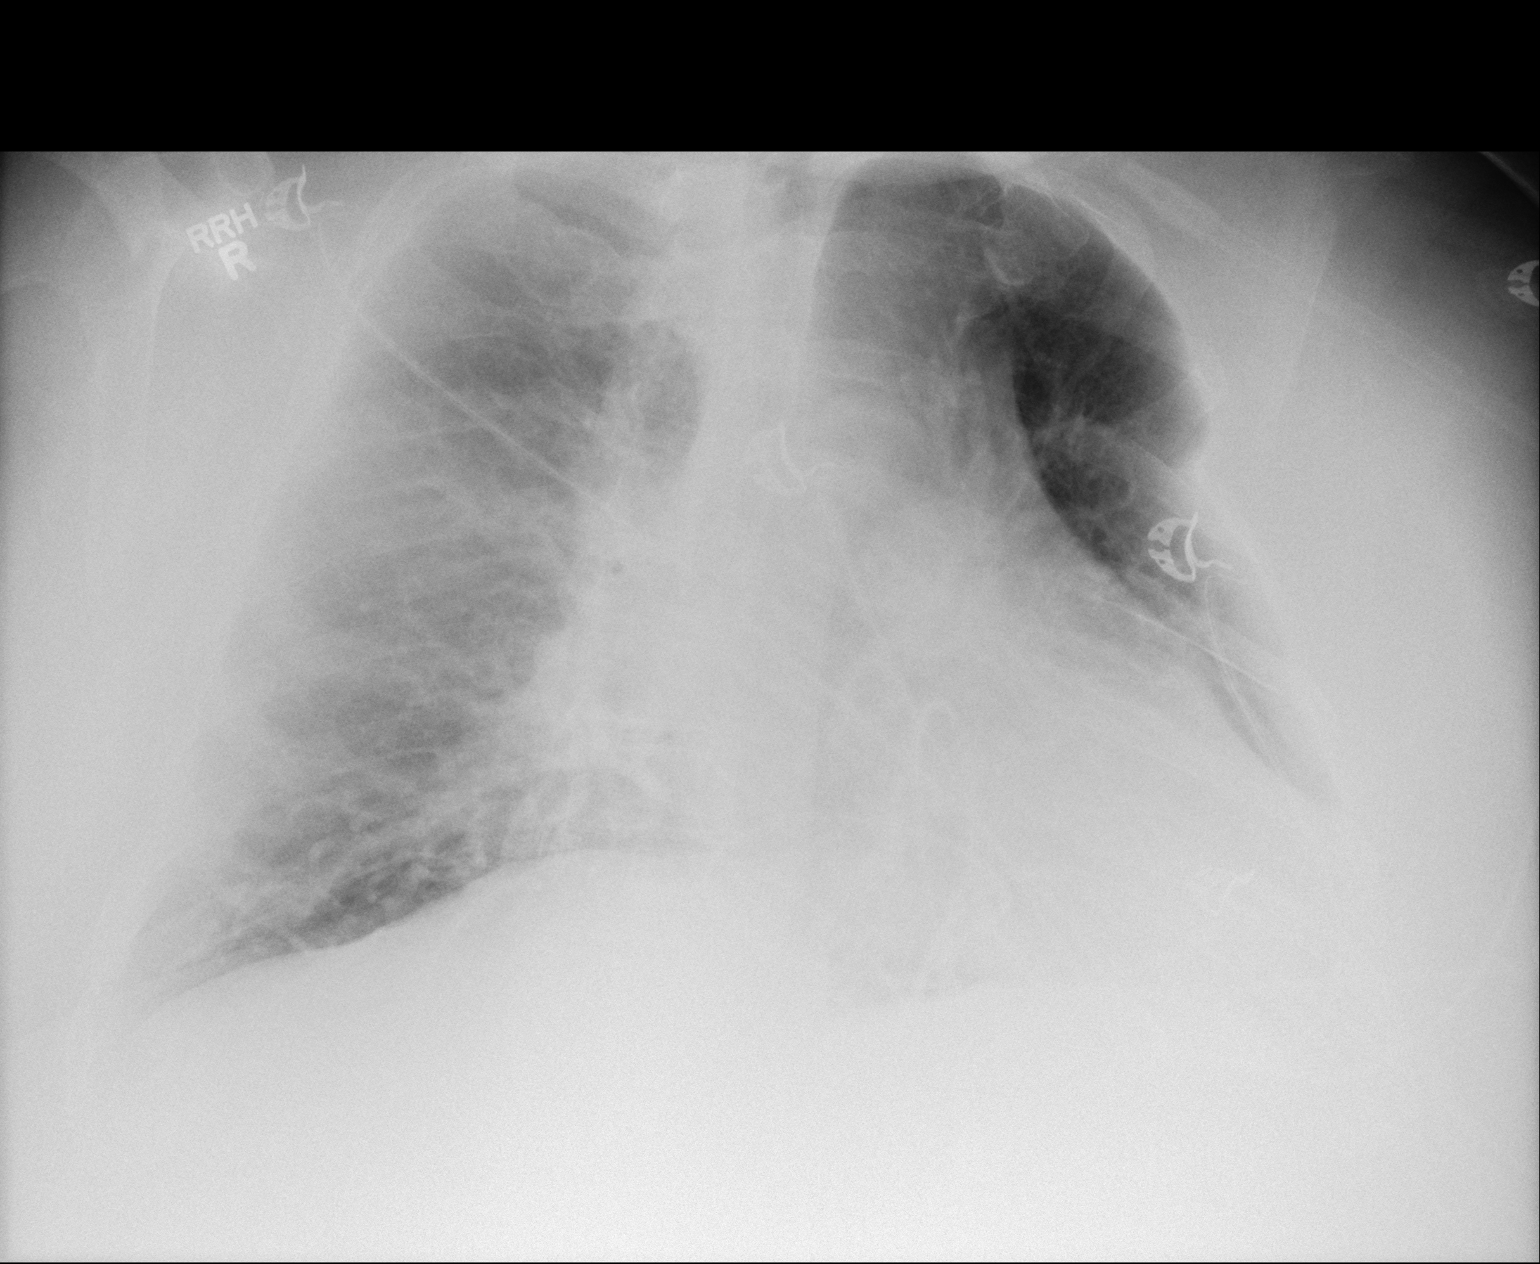

[1 of 1 positions shown; findings below may reference images not displayed]

FINDINGS: There is slight generalized interstitial pulmonary edema. No
airspace consolidation. There is cardiomegaly with pulmonary venous
hypertension. No adenopathy. No bone lesions.
IMPRESSION: Pulmonary vascular congestion with mild interstitial edema. Suspect
a degree of congestive heart failure. No airspace consolidation.

## 2017-06-05 ENCOUNTER — Other Ambulatory Visit: Payer: Self-pay | Admitting: Gastroenterology

## 2017-06-05 DIAGNOSIS — R1084 Generalized abdominal pain: Secondary | ICD-10-CM

## 2017-06-05 LAB — BASIC METABOLIC PANEL
BUN: 19 (ref 4–21)
Creatinine: 1.2 (ref 0.6–1.3)

## 2017-06-11 ENCOUNTER — Encounter: Payer: Self-pay | Admitting: "Endocrinology

## 2017-06-11 ENCOUNTER — Ambulatory Visit (INDEPENDENT_AMBULATORY_CARE_PROVIDER_SITE_OTHER): Payer: Medicare Other | Admitting: "Endocrinology

## 2017-06-11 DIAGNOSIS — E212 Other hyperparathyroidism: Secondary | ICD-10-CM

## 2017-06-11 DIAGNOSIS — E039 Hypothyroidism, unspecified: Secondary | ICD-10-CM | POA: Diagnosis not present

## 2017-06-11 MED ORDER — CALCITRIOL 0.25 MCG PO CAPS: 0.2500 ug | ORAL_CAPSULE | Freq: Two times a day (BID) | ORAL | 6 refills | Status: AC

## 2017-06-11 NOTE — Progress Notes (Signed)
Subjective:    Patient ID: [name], male    DOB: 10-Sep-1965, PCP Hilbert Corrigan, MD   Past Medical History:  Diagnosis Date  . Cirrhosis ([place]) sleep apnea  . COPD (chronic obstructive pulmonary disease) ([place])   . Diabetes mellitus without complication ([place])   . Hypertension   . Renal disorder    stage II  . Renal insufficiency   . Sleep apnea   . Suicide attempt ([place])   . Syncope   . Tobacco abuse    Past Surgical History:  Procedure Laterality Date  . CHOLECYSTECTOMY    . LEG SURGERY Left    Social History   Socioeconomic History  . Marital status: Single    Spouse name: None  . Number of children: None  . Years of education: None  . Highest education level: None  Social Needs  . Financial resource strain: None  . Food insecurity - worry: None  . Food insecurity - inability: None  . Transportation needs - medical: None  . Transportation needs - non-medical: None  Occupational History  . None  Tobacco Use  . Smoking status: Former Smoker    Packs/day: 0.50    Types: Cigarettes    Last attempt to quit: 11/09/2015    Years since quitting: 1.5  . Smokeless tobacco: Never Used  . Tobacco comment: slowing down  Substance and Sexual Activity  . Alcohol use: No    Alcohol/week: 0.0 oz  . Drug use: No  . Sexual activity: Not Currently  Other Topics Concern  . None  Social History Narrative  . None   Outpatient Encounter Medications as of 06/11/2017  Medication Sig  . ARIPiprazole (ABILIFY) 20 MG tablet Take 20 mg by mouth daily.  Marland Kitchen aspirin 81 MG tablet Take 81 mg by mouth daily.  Marland Kitchen atenolol (TENORMIN) 25 MG tablet Take 25 mg by mouth daily.   . busPIRone (BUSPAR) 7.5 MG tablet Take 7.5 mg by mouth 2 (two) times daily. 3 day dose to be completed 09/22/2016  . calcitRIOL (ROCALTROL) 0.25 MCG capsule Take 1 capsule (0.25 mcg total) by mouth 2 (two) times daily with a meal.  . calcium citrate (CALCITRATE - DOSED IN MG ELEMENTAL CALCIUM) 950 MG  tablet Take 1 tablet by mouth 3 (three) times daily with meals.  . cholecalciferol (VITAMIN D) 1000 UNITS tablet Take 1,000 Units by mouth daily. Reported on 05/03/2015  . citalopram (CELEXA) 40 MG tablet Take 40 mg by mouth at bedtime.   . cyclobenzaprine (FLEXERIL) 10 MG tablet Take 1 tablet (10 mg total) by mouth 3 (three) times daily as needed (muscle soreness).  . fluticasone furoate-vilanterol (BREO ELLIPTA) 100-25 MCG/INH AEPB Inhale 1 puff into the lungs daily.  Marland Kitchen gabapentin (NEURONTIN) 300 MG capsule Take 300 mg by mouth 3 (three) times daily.  Marland Kitchen guaiFENesin (MUCINEX) 600 MG 12 hr tablet Take 600 mg by mouth 2 (two) times daily.   . insulin aspart (NOVOLOG FLEXPEN) 100 UNIT/ML FlexPen Inject 15-20 Units into the skin 4 (four) times daily -  before meals and at bedtime. 15 units twice daily at 730a and 1130a, then take 20 units at 1700 in addition to sliding scale  150-200=2units Call np/md if less than 70 201-250=4units 251-300=6units 301-350=8units 351-400=10units Greater than 400 call np and md  . Insulin Glargine (LANTUS SOLOSTAR) 100 UNIT/ML Solostar Pen Inject 60 Units into the skin 2 (two) times daily.   Marland Kitchen ipratropium-albuterol (DUONEB) 0.5-2.5 (3) MG/3ML SOLN Take 3  mLs by nebulization 3 (three) times daily. (Patient taking differently: Take 3 mLs by nebulization 3 (three) times daily. *May also take every 4 hours as needed for shortness of breath)  . iron polysaccharides (NU-IRON) 150 MG capsule Take 150 mg by mouth daily.  Marland Kitchen lactulose, encephalopathy, (CHRONULAC) 10 GM/15ML SOLN Take 14.5 g by mouth 3 (three) times daily. 39mls 3 times daily  . levothyroxine (SYNTHROID, LEVOTHROID) 25 MCG tablet Take 25 mcg by mouth daily before breakfast.  . Melatonin 5 MG CAPS Take 1 capsule by mouth at bedtime.  . metolazone (ZAROXOLYN) 2.5 MG tablet Take 2.5 mg by mouth every Monday, Wednesday, and Friday.  . nicotine (NICODERM CQ - DOSED IN MG/24 HOURS) 14 mg/24hr patch Place 14 mg onto  the skin daily.  . nitroGLYCERIN (NITROSTAT) 0.4 MG SL tablet Place 0.4 mg under the tongue every 5 (five) minutes as needed for chest pain.  Marland Kitchen omeprazole (PRILOSEC) 20 MG capsule Take 20 mg by mouth daily.  . OXYGEN Inhale 3 L into the lungs daily as needed (for breathing).   . polyethylene glycol (MIRALAX / GLYCOLAX) packet Take 17 g by mouth daily as needed for mild constipation or moderate constipation.   Marland Kitchen PROAIR HFA 108 (90 BASE) MCG/ACT inhaler Inhale 2 puffs into the lungs every 6 (six) hours as needed for wheezing or shortness of breath. Shortness of breath/wheeze  . rifaximin (XIFAXAN) 200 MG tablet Take 200 mg by mouth daily.  . sennosides-docusate sodium (SENOKOT-S) 8.6-50 MG tablet Take 1 tablet by mouth daily.   . sitaGLIPtin (JANUVIA) 25 MG tablet Take 25 mg by mouth daily.  Marland Kitchen torsemide (DEMADEX) 20 MG tablet Take 20-40 mg by mouth See admin instructions. On Saturdays, Mondays, and Wednesdays take 20mg  and 40mg  on Sundays, Tuesdays, and Thursdays  . traMADol (ULTRAM) 50 MG tablet Take 1 tablet (50 mg total) by mouth every 6 (six) hours as needed for moderate pain.  . traZODone (DESYREL) 50 MG tablet Take 150 mg by mouth at bedtime.  . [DISCONTINUED] calcitRIOL (ROCALTROL) 0.5 MCG capsule Take 1 capsule (0.5 mcg total) by mouth 2 (two) times daily with a meal.  . [DISCONTINUED] Vitamin D, Ergocalciferol, (DRISDOL) 50000 units CAPS capsule Take 50,000 Units by mouth every 30 (thirty) days.   No facility-administered encounter medications on file as of 06/11/2017.    ALLERGIES: Allergies  Allergen Reactions  . Penicillins Anaphylaxis    Patient states he is not allergic to anything;  Pt says it's his twin brother that has this allergy.    VACCINATION STATUS: Immunization History  Administered Date(s) Administered  . Influenza-Unspecified 02/13/2015  . Pneumococcal Polysaccharide-23 08/31/2014    HPI  52 year old gentleman with medical history as above. He is being seen in  f/u  of hypocalcemia . - He was seen in March 2018 with labs showing  elevated PTH associated with hypocalcemia of 8.3.  - He is on 2 forms of vitamin D therapy and calcium citrate 3 times a day before meals. - He returns with new labs showing calcium  improving to 9.1,  Slowly improving from 7.7.  -His medical history is long and complicated. He is a nursing home resident, wheelchair-bound due to deconditioning from prior falls. - He is known to have chronic renal insufficiency requiring nephrology care when he was in [place] area. He was moved to  [place] 5 years ago, since when he did not see a nephrologist. - His latest labs show BUN of 19 creatinine 1.19, close to  target. - He is a poor historian,  accompanied  an aid to the exam room, but she does not know his history. - Review of his medical records shows on 06/04/2016 she was found to have PTH elevated at 95.14, calcium 8.3, GFR 53, BUN elevated at 39, creatinine elevated at 1.48. His 25-hydroxy vitamin D level was normal at 41.  - He denies any prior history of parathyroid,  pituitary, nor adrenal dysfunction. - He has been overweight/obese most of his adult life, is an active smoker. He uses oxygen per nasal cannula, related to his history of advanced COPD. - He has well controlled type 2 diabetes on insulin treatment, as well as hypothyroidism on low-dose levothyroxine 25 g by mouth every morning.  Review of Systems  Constitutional:  + Wheelchair-bound, + Progressive weight gain,  + fatigue, no subjective hyperthermia, nor hypothymia . Eyes: no blurry vision, no xerophthalmia ENT: no sore throat, no nodules palpated in throat, no dysphagia/odynophagia, no hoarseness Cardiovascular: no Chest Pain, + Shortness of Breath, no palpitations, + leg swelling Respiratory: + cough, + SOB Gastrointestinal: no Nausea/Vomiting/Diarhhea Musculoskeletal: no muscle/joint aches Skin: no rashes Neurological: + tremors, no numbness, no  tingling, no dizziness Psychiatric: + depression, no anxiety  Objective:    BP (!) 141/80   Pulse 79   Wt Readings from Last 3 Encounters:  10/11/16 (!) 393 lb 11.9 oz (178.6 kg)  09/23/16 (!) 436 lb 11.7 oz (198.1 kg)  06/15/16 (!) 385 lb (174.6 kg)    Physical Exam  Constitutional: On a wheelchair using oxygen per nasal cannula, significantly obese, not in acute distress, normal state of mind, disheveled with poor hygiene. Eyes: PERRLA, EOMI, no exophthalmos ENT: moist mucous membranes, thick neck difficult to examine ,  No gross thyromegaly, no cervical lymphadenopathy Cardiovascular: Distant heart sounds, no Murmur/Rubs/Gallops Respiratory:  inadequate breathing efforts, no gross chest deformity, + bilateral rales. Gastrointestinal: abdomen soft, + obese, Non -tender, + distension, Bowel Sounds present Musculoskeletal: Nonpitting edema on bilateral lower extremities,+ tight skin, suspicious for lymphedema. Skin: + Dry skin, no active rashes Neurological: + Gross tremor at rest, depressed Deep tendon reflexes in  bilateral extremities.  CMP Latest Ref Rng & Units 10/11/2016 10/10/2016 10/09/2016  Glucose 65 - 99 mg/dL 157(H) 145(H) 171(H)  BUN 6 - 20 mg/dL 65(H) 84(H) 88(H)  Creatinine 0.61 - 1.24 mg/dL 2.27(H) 3.51(H) 4.39(H)  Sodium 135 - 145 mmol/L 143 142 139  Potassium 3.5 - 5.1 mmol/L 4.7 4.7 5.0  Chloride 101 - 111 mmol/L 100(L) 96(L) 93(L)  CO2 22 - 32 mmol/L 36(H) 37(H) 37(H)  Calcium 8.9 - 10.3 mg/dL 8.2(L) 8.4(L) 8.6(L)  Total Protein 6.5 - 8.1 g/dL - - 6.9  Total Bilirubin 0.3 - 1.2 mg/dL - - 0.8  Alkaline Phos 38 - 126 U/L - - 80  AST 15 - 41 U/L - - 21  ALT 17 - 63 U/L - - 37      Diabetic Labs (most recent): Lab Results  Component Value Date   HGBA1C 6.1 (H) 10/09/2016   HGBA1C 5.0 03/26/2013     Lipid Panel ( most recent) Lipid Panel     Component Value Date/Time   CHOL 112 12/23/2011 0720   TRIG 85 12/23/2011 0720   HDL 33 (L) 12/23/2011 0720    VLDL 17 12/23/2011 0720   LDLCALC 62 12/23/2011 0720    Labs from 06/05/2017 showed BUN 19, creatinine 1.19, calcium 9.1 Labs from 06/04/2016 showed calcium low at 8.3, PTH high at  95.14 (normal 15-65), GFR low at 53. - Recent labs from 11/16/2016 showed BUN 14.1, creatinine 1.34, calcium 7.7, estimated GFR 60.14.     Assessment & Plan:   1. Secondary hyperparathyroidism -  likely triggered by CK D which is now improving versus vitamin D deficiency.  -  review of his latest labs show normalization of calcium at 9.1. - He has benefited from higher dose of calcitriol, I will lower the dose to 0.25 g by mouth twice a day.  - I advised to continue cholecalciferol 1000 units daily, his most recent vitamin D 25 level was 41-acceptable.  - It is unlikely that he has primary hyperparathyroidism.  -  he will still need PTH/calcium before his next visit. - He is a poor surgical candidate even if he has primary hyperparathyroidism.  2. Hypocalcemia -His calcium level is improved to 9.1 mg/dL from 7.7. - I'll continue  calcium citrate 950 mg by mouth 3 times a day with meals.   - I advised patient to maintain close follow up with Hilbert Corrigan, MD for primary care needs. Follow up plan: Return in about 6 months (around 12/09/2017) for follow up with pre-visit labs.  Glade Lloyd, MD Phone: [phone]  Fax: [phone]   -  This note was partially dictated with voice recognition software. Similar sounding words can be transcribed inadequately or may not  be corrected upon review.  06/11/2017, 11:52 AM

## 2017-06-12 ENCOUNTER — Other Ambulatory Visit: Payer: Self-pay | Admitting: Gastroenterology

## 2017-06-12 DIAGNOSIS — K7030 Alcoholic cirrhosis of liver without ascites: Secondary | ICD-10-CM

## 2017-06-12 DIAGNOSIS — R1084 Generalized abdominal pain: Secondary | ICD-10-CM

## 2017-06-13 ENCOUNTER — Ambulatory Visit: Payer: Medicare Other

## 2017-06-14 ENCOUNTER — Ambulatory Visit (HOSPITAL_COMMUNITY)
Admission: RE | Admit: 2017-06-14 | Discharge: 2017-06-14 | Disposition: A | Discharge status: Home / Self Care | Payer: Medicare Other | Source: Ambulatory Visit | Attending: Gastroenterology | Admitting: Gastroenterology

## 2017-06-14 DIAGNOSIS — R1084 Generalized abdominal pain: Secondary | ICD-10-CM | POA: Insufficient documentation

## 2017-06-14 DIAGNOSIS — I700 Atherosclerosis of aorta: Secondary | ICD-10-CM | POA: Insufficient documentation

## 2017-06-14 DIAGNOSIS — R161 Splenomegaly, not elsewhere classified: Secondary | ICD-10-CM | POA: Insufficient documentation

## 2017-06-14 DIAGNOSIS — K766 Portal hypertension: Secondary | ICD-10-CM | POA: Insufficient documentation

## 2017-06-14 DIAGNOSIS — K7030 Alcoholic cirrhosis of liver without ascites: Secondary | ICD-10-CM | POA: Diagnosis present

## 2017-06-14 MED ORDER — IOPAMIDOL (ISOVUE-300) INJECTION 61%
100.0000 mL | Freq: Once | INTRAVENOUS | Status: AC | PRN
  Administered 2017-06-14: 100 mL via INTRAVENOUS

## 2017-08-13 DEATH — deceased

## 2017-12-10 ENCOUNTER — Ambulatory Visit: Payer: Medicare Other | Admitting: "Endocrinology

## 2023-09-21 ENCOUNTER — Telehealth: Payer: Self-pay | Admitting: Cardiology

## 2023-09-21 NOTE — Telephone Encounter (Signed)
 Tried to call pharmacy back, but number did not work. Pt is not an established pt at this practice.

## 2023-09-21 NOTE — Telephone Encounter (Signed)
*  STAT* If patient is at the pharmacy, call can be transferred to refill team.   1. Which medications need to be refilled? (please list name of each medication and dose if known) new prescription for Atorvastatin- I did not see this on medication list he phone number for [facility]-does not seen to be correct  2. Would you like to learn more about the convenience, safety, & potential cost savings by using the [facility]?     3. Are you open to using the [facility] (Type [facility]   4. Which pharmacy/location (including street and city if local pharmacy) is medication to be sent to?[facility]   5. Do they need a 30 day or 90 day supply? 90 days and refills
